# Patient Record
Sex: Female | Born: 1960
Health system: Southern US, Community
[De-identification: ages and names within clinical notes are randomized; demographics above are authoritative.]

## PROBLEM LIST (undated history)

## (undated) DIAGNOSIS — M1990 Unspecified osteoarthritis, unspecified site: Secondary | ICD-10-CM

## (undated) DIAGNOSIS — K760 Fatty (change of) liver, not elsewhere classified: Secondary | ICD-10-CM

## (undated) DIAGNOSIS — N189 Chronic kidney disease, unspecified: Secondary | ICD-10-CM

## (undated) DIAGNOSIS — G4730 Sleep apnea, unspecified: Secondary | ICD-10-CM

## (undated) DIAGNOSIS — R011 Cardiac murmur, unspecified: Secondary | ICD-10-CM

## (undated) DIAGNOSIS — E7800 Pure hypercholesterolemia, unspecified: Secondary | ICD-10-CM

## (undated) DIAGNOSIS — R935 Abnormal findings on diagnostic imaging of other abdominal regions, including retroperitoneum: Secondary | ICD-10-CM

## (undated) DIAGNOSIS — D649 Anemia, unspecified: Secondary | ICD-10-CM

## (undated) DIAGNOSIS — L732 Hidradenitis suppurativa: Secondary | ICD-10-CM

## (undated) DIAGNOSIS — K219 Gastro-esophageal reflux disease without esophagitis: Secondary | ICD-10-CM

## (undated) DIAGNOSIS — Z973 Presence of spectacles and contact lenses: Secondary | ICD-10-CM

## (undated) DIAGNOSIS — G8929 Other chronic pain: Secondary | ICD-10-CM

## (undated) DIAGNOSIS — D219 Benign neoplasm of connective and other soft tissue, unspecified: Secondary | ICD-10-CM

## (undated) DIAGNOSIS — T8859XA Other complications of anesthesia, initial encounter: Secondary | ICD-10-CM

## (undated) DIAGNOSIS — T7840XA Allergy, unspecified, initial encounter: Secondary | ICD-10-CM

## (undated) DIAGNOSIS — I10 Essential (primary) hypertension: Secondary | ICD-10-CM

## (undated) HISTORY — DX: Sleep apnea, unspecified: G47.30

## (undated) HISTORY — DX: Essential (primary) hypertension: I10

## (undated) HISTORY — PX: INGUINAL HIDRADENITIS EXCISION: SHX1827

## (undated) HISTORY — DX: Anemia, unspecified: D64.9

## (undated) HISTORY — DX: Abnormal findings on diagnostic imaging of other abdominal regions, including retroperitoneum: R93.5

## (undated) HISTORY — PX: ABDOMINAL HYSTERECTOMY: SHX81

## (undated) HISTORY — DX: Allergy, unspecified, initial encounter: T78.40XA

## (undated) HISTORY — PX: BREAST EXCISIONAL BIOPSY: SUR124

## (undated) HISTORY — PX: BREAST SURGERY: SHX581

## (undated) HISTORY — DX: Cardiac murmur, unspecified: R01.1

## (undated) HISTORY — DX: Hidradenitis suppurativa: L73.2

## (undated) HISTORY — DX: Benign neoplasm of connective and other soft tissue, unspecified: D21.9

---

## 2000-01-27 HISTORY — PX: SUPRACERVICAL ABDOMINAL HYSTERECTOMY: SHX5393

## 2004-09-26 ENCOUNTER — Encounter: Payer: Self-pay | Admitting: Family Medicine

## 2004-09-26 LAB — CONVERTED CEMR LAB: Pap Smear: NORMAL

## 2004-11-06 ENCOUNTER — Ambulatory Visit: Payer: Self-pay | Admitting: Family Medicine

## 2004-12-04 ENCOUNTER — Ambulatory Visit: Payer: Self-pay | Admitting: Family Medicine

## 2005-01-14 ENCOUNTER — Ambulatory Visit: Payer: Self-pay | Admitting: Family Medicine

## 2005-01-29 ENCOUNTER — Ambulatory Visit: Payer: Self-pay | Admitting: Family Medicine

## 2005-06-04 ENCOUNTER — Ambulatory Visit: Payer: Self-pay | Admitting: Family Medicine

## 2005-06-11 ENCOUNTER — Ambulatory Visit: Payer: Self-pay | Admitting: *Deleted

## 2005-11-03 DIAGNOSIS — E785 Hyperlipidemia, unspecified: Secondary | ICD-10-CM

## 2005-11-03 DIAGNOSIS — I10 Essential (primary) hypertension: Secondary | ICD-10-CM | POA: Insufficient documentation

## 2005-11-03 DIAGNOSIS — E1169 Type 2 diabetes mellitus with other specified complication: Secondary | ICD-10-CM | POA: Insufficient documentation

## 2005-12-03 ENCOUNTER — Ambulatory Visit: Payer: Self-pay | Admitting: Family Medicine

## 2005-12-03 ENCOUNTER — Encounter: Payer: Self-pay | Admitting: Family Medicine

## 2006-01-22 ENCOUNTER — Emergency Department (HOSPITAL_COMMUNITY): Admission: EM | Admit: 2006-01-22 | Discharge: 2006-01-22 | Payer: Self-pay | Admitting: Emergency Medicine

## 2006-03-24 ENCOUNTER — Encounter: Admission: RE | Admit: 2006-03-24 | Discharge: 2006-03-24 | Payer: Self-pay | Admitting: Family Medicine

## 2006-07-29 ENCOUNTER — Ambulatory Visit: Payer: Self-pay | Admitting: Family Medicine

## 2006-07-29 LAB — CONVERTED CEMR LAB
Cholesterol, target level: 200 mg/dL
HDL goal, serum: 40 mg/dL
LDL Goal: 160 mg/dL

## 2006-08-04 ENCOUNTER — Encounter: Payer: Self-pay | Admitting: Family Medicine

## 2006-08-04 DIAGNOSIS — N951 Menopausal and female climacteric states: Secondary | ICD-10-CM | POA: Insufficient documentation

## 2006-08-05 ENCOUNTER — Encounter: Payer: Self-pay | Admitting: Family Medicine

## 2006-08-05 LAB — CONVERTED CEMR LAB
ALT: 12 units/L (ref 0–35)
AST: 14 units/L (ref 0–37)
Albumin: 4.4 g/dL (ref 3.5–5.2)
Alkaline Phosphatase: 58 units/L (ref 39–117)
BUN: 13 mg/dL (ref 6–23)
CO2: 26 meq/L (ref 19–32)
Calcium: 9.7 mg/dL (ref 8.4–10.5)
Chloride: 101 meq/L (ref 96–112)
Cholesterol: 234 mg/dL — ABNORMAL HIGH (ref 0–200)
Creatinine, Ser: 0.76 mg/dL (ref 0.40–1.20)
FSH: 7.4 milliintl units/mL
Glucose, Bld: 107 mg/dL — ABNORMAL HIGH (ref 70–99)
HDL: 51 mg/dL (ref >39)
LDL Cholesterol: 147 mg/dL — ABNORMAL HIGH (ref 0–99)
LH: 4 milliintl units/mL
Potassium: 3.4 meq/L — ABNORMAL LOW (ref 3.5–5.3)
Sodium: 139 meq/L (ref 135–145)
Total Bilirubin: 0.5 mg/dL (ref 0.3–1.2)
Total CHOL/HDL Ratio: 4.6
Total Protein: 7.3 g/dL (ref 6.0–8.3)
Triglycerides: 178 mg/dL — ABNORMAL HIGH (ref <150)
VLDL: 36 mg/dL (ref 0–40)

## 2006-11-25 ENCOUNTER — Ambulatory Visit: Payer: Self-pay | Admitting: Family Medicine

## 2007-04-11 ENCOUNTER — Encounter: Admission: RE | Admit: 2007-04-11 | Discharge: 2007-04-11 | Payer: Self-pay | Admitting: Obstetrics and Gynecology

## 2007-06-30 ENCOUNTER — Ambulatory Visit: Payer: Self-pay | Admitting: Family Medicine

## 2007-07-18 ENCOUNTER — Encounter: Payer: Self-pay | Admitting: Family Medicine

## 2007-07-19 LAB — CONVERTED CEMR LAB
ALT: 13 units/L (ref 0–35)
AST: 15 units/L (ref 0–37)
Albumin: 3.9 g/dL (ref 3.5–5.2)
Alkaline Phosphatase: 50 units/L (ref 39–117)
BUN: 13 mg/dL (ref 6–23)
CO2: 21 meq/L (ref 19–32)
Calcium: 9 mg/dL (ref 8.4–10.5)
Chloride: 103 meq/L (ref 96–112)
Cholesterol: 200 mg/dL (ref 0–200)
Creatinine, Ser: 0.63 mg/dL (ref 0.40–1.20)
Glucose, Bld: 98 mg/dL (ref 70–99)
HDL: 43 mg/dL (ref >39)
LDL Cholesterol: 114 mg/dL — ABNORMAL HIGH (ref 0–99)
Potassium: 3.9 meq/L (ref 3.5–5.3)
Sodium: 139 meq/L (ref 135–145)
Total Bilirubin: 0.3 mg/dL (ref 0.3–1.2)
Total CHOL/HDL Ratio: 4.7
Total Protein: 6.3 g/dL (ref 6.0–8.3)
Triglycerides: 216 mg/dL — ABNORMAL HIGH (ref <150)
VLDL: 43 mg/dL — ABNORMAL HIGH (ref 0–40)

## 2007-08-22 ENCOUNTER — Ambulatory Visit: Payer: Self-pay | Admitting: Family Medicine

## 2007-08-22 LAB — CONVERTED CEMR LAB
Bilirubin Urine: NEGATIVE
Blood in Urine, dipstick: NEGATIVE
Glucose, Urine, Semiquant: NEGATIVE
Ketones, urine, test strip: NEGATIVE
Nitrite: NEGATIVE
Protein, U semiquant: NEGATIVE
Specific Gravity, Urine: <1.005
Urobilinogen, UA: 0.2
WBC Urine, dipstick: NEGATIVE
pH: 5.5

## 2007-08-23 ENCOUNTER — Encounter: Payer: Self-pay | Admitting: Family Medicine

## 2008-01-02 ENCOUNTER — Ambulatory Visit: Payer: Self-pay | Admitting: Family Medicine

## 2008-01-02 DIAGNOSIS — M170 Bilateral primary osteoarthritis of knee: Secondary | ICD-10-CM | POA: Insufficient documentation

## 2008-01-02 DIAGNOSIS — M1712 Unilateral primary osteoarthritis, left knee: Secondary | ICD-10-CM | POA: Insufficient documentation

## 2008-04-02 ENCOUNTER — Ambulatory Visit: Payer: Self-pay | Admitting: Family Medicine

## 2008-04-02 DIAGNOSIS — M2550 Pain in unspecified joint: Secondary | ICD-10-CM | POA: Insufficient documentation

## 2008-04-02 DIAGNOSIS — G2581 Restless legs syndrome: Secondary | ICD-10-CM | POA: Insufficient documentation

## 2008-04-11 ENCOUNTER — Encounter: Admission: RE | Admit: 2008-04-11 | Discharge: 2008-04-11 | Payer: Self-pay | Admitting: Obstetrics and Gynecology

## 2008-04-27 ENCOUNTER — Ambulatory Visit: Payer: Self-pay | Admitting: Family Medicine

## 2008-04-27 DIAGNOSIS — I10 Essential (primary) hypertension: Secondary | ICD-10-CM | POA: Insufficient documentation

## 2008-04-27 LAB — CONVERTED CEMR LAB: Rapid Strep: NEGATIVE

## 2008-08-20 ENCOUNTER — Encounter: Payer: Self-pay | Admitting: Family Medicine

## 2008-08-21 LAB — CONVERTED CEMR LAB
Anti Nuclear Antibody(ANA): NEGATIVE
BUN: 12 mg/dL (ref 6–23)
CO2: 23 meq/L (ref 19–32)
Calcium: 8.9 mg/dL (ref 8.4–10.5)
Chloride: 103 meq/L (ref 96–112)
Cholesterol: 204 mg/dL — ABNORMAL HIGH (ref 0–200)
Creatinine, Ser: 0.62 mg/dL (ref 0.40–1.20)
Glucose, Bld: 95 mg/dL (ref 70–99)
HCT: 36.8 % (ref 36.0–46.0)
HDL: 41 mg/dL (ref >39)
Hemoglobin: 12.4 g/dL (ref 12.0–15.0)
LDL Cholesterol: 127 mg/dL — ABNORMAL HIGH (ref 0–99)
MCHC: 33.7 g/dL (ref 30.0–36.0)
MCV: 87.8 fL (ref 78.0–100.0)
Platelets: 266 10*3/uL (ref 150–400)
Potassium: 3.9 meq/L (ref 3.5–5.3)
RBC: 4.19 M/uL (ref 3.87–5.11)
RDW: 13.1 % (ref 11.5–15.5)
Sed Rate: 19 mm/hr (ref 0–22)
Sodium: 140 meq/L (ref 135–145)
TSH: 0.801 microintl units/mL (ref 0.350–4.500)
Total CHOL/HDL Ratio: 5
Triglycerides: 178 mg/dL — ABNORMAL HIGH (ref <150)
VLDL: 36 mg/dL (ref 0–40)
WBC: 9.5 10*3/uL (ref 4.0–10.5)

## 2008-09-07 ENCOUNTER — Ambulatory Visit: Payer: Self-pay | Admitting: Family Medicine

## 2008-09-07 DIAGNOSIS — F410 Panic disorder [episodic paroxysmal anxiety] without agoraphobia: Secondary | ICD-10-CM | POA: Insufficient documentation

## 2008-09-19 ENCOUNTER — Ambulatory Visit: Payer: Self-pay | Admitting: Family Medicine

## 2008-09-19 ENCOUNTER — Encounter: Payer: Self-pay | Admitting: Family Medicine

## 2009-03-05 ENCOUNTER — Ambulatory Visit: Payer: Self-pay | Admitting: Family Medicine

## 2009-03-13 ENCOUNTER — Encounter: Admission: RE | Admit: 2009-03-13 | Discharge: 2009-03-13 | Payer: Self-pay | Admitting: Family Medicine

## 2009-04-12 ENCOUNTER — Encounter: Admission: RE | Admit: 2009-04-12 | Discharge: 2009-04-12 | Payer: Self-pay | Admitting: Obstetrics and Gynecology

## 2009-06-05 ENCOUNTER — Encounter: Admission: RE | Admit: 2009-06-05 | Discharge: 2009-06-05 | Payer: Self-pay | Admitting: Orthopedic Surgery

## 2009-06-28 ENCOUNTER — Encounter: Payer: Self-pay | Admitting: Family Medicine

## 2009-07-02 ENCOUNTER — Ambulatory Visit: Payer: Self-pay | Admitting: Family

## 2009-07-02 DIAGNOSIS — E1169 Type 2 diabetes mellitus with other specified complication: Secondary | ICD-10-CM | POA: Insufficient documentation

## 2009-07-02 DIAGNOSIS — R809 Proteinuria, unspecified: Secondary | ICD-10-CM | POA: Insufficient documentation

## 2009-07-10 ENCOUNTER — Encounter: Payer: Self-pay | Admitting: Family

## 2009-07-10 LAB — CONVERTED CEMR LAB
ALT: 17 units/L (ref 0–35)
AST: 17 units/L (ref 0–37)
Albumin: 4.4 g/dL (ref 3.5–5.2)
Alkaline Phosphatase: 52 units/L (ref 39–117)
BUN: 14 mg/dL (ref 6–23)
CO2: 25 meq/L (ref 19–32)
Calcium: 8.9 mg/dL (ref 8.4–10.5)
Chloride: 100 meq/L (ref 96–112)
Cholesterol: 231 mg/dL — ABNORMAL HIGH (ref 0–200)
Creatinine, Ser: 0.62 mg/dL (ref 0.40–1.20)
Creatinine, Urine: 125.6 mg/dL
Glucose, Bld: 103 mg/dL — ABNORMAL HIGH (ref 70–99)
HDL: 45 mg/dL (ref >39)
Hgb A1c MFr Bld: 6.4 % — ABNORMAL HIGH (ref <5.7)
LDL Cholesterol: 138 mg/dL — ABNORMAL HIGH (ref 0–99)
Microalb Creat Ratio: 4.5 mg/g (ref 0.0–30.0)
Microalb, Ur: 0.56 mg/dL (ref 0.00–1.89)
Potassium: 3.7 meq/L (ref 3.5–5.3)
Sodium: 137 meq/L (ref 135–145)
Total Bilirubin: 0.3 mg/dL (ref 0.3–1.2)
Total CHOL/HDL Ratio: 5.1
Total Protein: 6.9 g/dL (ref 6.0–8.3)
Triglycerides: 242 mg/dL — ABNORMAL HIGH (ref <150)
VLDL: 48 mg/dL — ABNORMAL HIGH (ref 0–40)

## 2009-07-15 ENCOUNTER — Telehealth: Payer: Self-pay | Admitting: Family

## 2009-08-12 ENCOUNTER — Encounter: Admission: RE | Admit: 2009-08-12 | Discharge: 2009-08-12 | Payer: Self-pay | Admitting: Family Medicine

## 2009-08-12 ENCOUNTER — Encounter: Payer: Self-pay | Admitting: Family Medicine

## 2009-10-24 ENCOUNTER — Encounter: Payer: Self-pay | Admitting: Emergency Medicine

## 2009-11-05 ENCOUNTER — Encounter
Admission: RE | Admit: 2009-11-05 | Discharge: 2009-11-05 | Payer: Self-pay | Source: Home / Self Care | Attending: Family Medicine | Admitting: Family Medicine

## 2009-11-12 ENCOUNTER — Encounter
Admission: RE | Admit: 2009-11-12 | Discharge: 2009-11-12 | Payer: Self-pay | Source: Home / Self Care | Attending: Family Medicine | Admitting: Family Medicine

## 2009-11-28 ENCOUNTER — Encounter: Payer: Self-pay | Admitting: Family Medicine

## 2009-11-28 LAB — CONVERTED CEMR LAB
Cholesterol: 169 mg/dL
HDL: 50 mg/dL
Hgb A1c MFr Bld: 6.5 %
LDL Cholesterol: 79 mg/dL
Triglyceride fasting, serum: 200 mg/dL

## 2009-12-16 ENCOUNTER — Ambulatory Visit: Payer: Self-pay | Admitting: Family Medicine

## 2009-12-25 ENCOUNTER — Encounter: Payer: Self-pay | Admitting: Family Medicine

## 2009-12-25 ENCOUNTER — Telehealth: Payer: Self-pay | Admitting: Family Medicine

## 2010-02-25 NOTE — Assessment & Plan Note (Signed)
Summary: COLD OR SINUS/KM   Vital Signs:  Patient Profile:   50 Years Old Female CC:      headache, sorethroat, sinus pressure for three days Height:     60.5 inches Weight:      174 pounds O2 Sat:      96 % O2 treatment:    Room Air Temp:     97.3 degrees F oral Pulse rate:   93 / minute Pulse rhythm:   regular Resp:     17 per minute BP sitting:   147 / 91  (left arm)  Pt. in pain?   no  Vitals Entered By: Lannie Fields, RN                   Current Allergies: ! CECLOR ! SULFA ! TETRACYCLINE ! PCN History of Present Illness Chief Complaint: headache, sorethroat, sinus pressure for three days History of Present Illness: Subjective: Patient complains of URI symptoms for one week, starting with a sore throat that has persisted + cough that started last night, keeping her awake No pleuritic pain No wheezing + nasal congestion No post-nasal drainage ? sinus pain/pressure No itchy/red eyes No earache No hemoptysis No SOB No fever/chills No nausea No vomiting No abdominal pain No diarrhea No skin rashes + fatigue + myalgias No headache Used OTC meds without relief    REVIEW OF SYSTEMS Constitutional Symptoms      Denies fever, chills, night sweats, weight loss, weight gain, and fatigue.  Eyes       Denies change in vision, eye pain, eye discharge, glasses, contact lenses, and eye surgery. Ear/Nose/Throat/Mouth       Complains of sinus problems and sore throat.      Denies hearing loss/aids, change in hearing, ear pain, ear discharge, dizziness, frequent runny nose, frequent nose bleeds, hoarseness, and tooth pain or bleeding.  Respiratory       Complains of dry cough.      Denies productive cough, wheezing, shortness of breath, asthma, bronchitis, and emphysema/COPD.  Cardiovascular       Denies murmurs, chest pain, and tires easily with exhertion.    Gastrointestinal       Denies stomach pain, nausea/vomiting, diarrhea, constipation, blood in bowel  movements, and indigestion. Genitourniary       Denies painful urination, kidney stones, and loss of urinary control. Neurological       Complains of headaches.      Denies paralysis, seizures, and fainting/blackouts. Musculoskeletal       Denies muscle pain, joint pain, joint stiffness, decreased range of motion, redness, swelling, muscle weakness, and gout.  Skin       Denies bruising, unusual mles/lumps or sores, and hair/skin or nail changes.  Psych       Denies mood changes, temper/anger issues, anxiety/stress, speech problems, depression, and sleep problems.  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  Divorced.  2 daughters.  Never smoked.  No EtOH, no drugs, 2 caffeinated drinks per day.  Walks daily.  Objective:  No acute distress  Eyes:  Pupils are equal, round, and reactive to light and accomdation.  Extraocular movement is intact.  Conjunctivae are not inflamed.  Ears:  Canals normal.  Tympanic membranes normal.   Nose:  Normal septum.  Normal turbinates, mildly congested.    No sinus tenderness present.  Pharynx:  Mildly erythematous Neck:  Supple.  Slightly tender shotty anterior/posterior nodes are palpated bilaterally.  Lungs:  Clear to auscultation.  Breath sounds are equal.  Heart:  Regular rate and rhythm without murmurs, rubs, or gallops.  Abdomen:  Nontender without masses or hepatosplenomegaly.  Bowel sounds are present.  No CVA or flank tenderness.  Rapid strep:  Negative    Assessment New Problems: URI (ICD-465.9) FAMILY HISTORY DIABETES 1ST DEGREE RELATIVE (ICD-V18.0) FAMILY HISTORY OF  ASTHMA (ICD-V17.5) HYPERTENSION (ICD-401.9)  Suspect viral URI  Plan New Medications/Changes: ZITHROMAX Z-PAK 250 MG  TABS (AZITHROMYCIN) Use as directed  #1 x 0, 04/27/2008, Donna Christen MD Sandria Senter ER 8-10 MG/5ML LQCR (CHLORPHENIRAMINE-HYDROCODONE) 5cc by mouth hs as needed cough  #2oz x 0, 04/27/2008, Donna Christen MD  New Orders: New Patient Level III [phone] Planning Comments:   Treat symptomatically for now:  expectorant, topical decongestant.  Cough suprressant at night. Add Zithromax if not improving in 5 days. Follow-up with family doctor.   The patient and/or caregiver has been counseled thoroughly with regard to medications prescribed including dosage, schedule, interactions, rationale for use, and possible side effects and they verbalize understanding.  Diagnoses and expected course of recovery discussed and will return if not improved as expected or if the condition worsens. Patient and/or caregiver verbalized understanding.    Prescriptions: ZITHROMAX Z-PAK 250 MG  TABS (AZITHROMYCIN) Use as directed  #1 x 0   Entered and Authorized by:   Donna Christen MD   Signed by:   Donna Christen MD on 04/27/2008   Method used:   Print then Give to Patient   RxID:   [id number] Sandria Senter ER 8-10 MG/5ML LQCR (CHLORPHENIRAMINE-HYDROCODONE) 5cc by mouth hs as needed cough  #2oz x 0   Entered and Authorized by:   Donna Christen MD   Signed by:   Donna Christen MD on 04/27/2008   Method used:   Print then Give to Patient   RxID:   [id number]    Patient Instructions: 1)  May use Mucinex  (guaifenesin) twice daily for congestion. 2)  Increase fluid intake, rest. 3)  May use Afrin nasal spray (or generic oxymetazoline) twice daily for about 5 days.  Also recommend using saline nasal spray several times daily and/or saline nasal irrigation. 4)  Add Zithromax if not improving 5 to 7 days. 5)  Followup with family doctor if not improving  ] ]  Laboratory Results  Date/Time Received: Areta Haber CMA  April 27, 2008 12:01 PM  Date/Time Reported: Areta Haber CMA  April 27, 2008 12:01 PM   Other Tests  Rapid Strep: negative  Stool - Occult Blood Date: .  Kit Test Internal QC: Negative   (Normal Range: Negative)

## 2010-02-25 NOTE — Assessment & Plan Note (Signed)
Summary: HTN, glucose   Vital Signs:  Patient profile:   50 year old female Height:      60.5 inches Weight:      166 pounds Pulse rate:   73 / minute BP sitting:   111 / 61  (right arm) Cuff size:   large  Vitals Entered By: Avon Gully CMA, Duncan Dull) (December 16, 2009 4:07 PM) CC: discuss lab results, needs refills   Primary Care Provider:  Linford Arnold, C  CC:  discuss lab results and needs refills.  History of Present Illness: discuss lab results, needs refills.  Brought in a copy of her labs from work.   Sugars have been runnin 112-129 pre meal nad 150-160s post meal.  She is getting nutrition counseling and has been working out on the treadmill 3 x a week.  She has been trying to lose weight. She is down 4 lbs.   Current Medications (verified): 1)  Lisinopril-Hydrochlorothiazide 20-25 Mg Tabs (Lisinopril-Hydrochlorothiazide) .... Take 1 Tablet By Mouth Once A Day 2)  Tru-Trak Test Strips .... Test Twice Daily 3)  Lancets  Misc (Lancets) 4)  Aspirin 81 Mg Tabs (Aspirin) .... One Tablet By Mouth Daily 5)  Pravachol 20 Mg Tabs (Pravastatin Sodium) .... One Tablet By Mouth Daily  Allergies (verified): 1)  ! Ceclor 2)  ! Sulfa 3)  ! Tetracycline 4)  ! Pcn  Comments:  Nurse/Medical Assistant: The patient's medications and allergies were reviewed with the patient and were updated in the Medication and Allergy Lists. 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  2 daughters.  Never smoked.  No EtOH, no drugs, 2 caffeinated drinks per day.  Walks daily on the treadmill.   Physical Exam  General:  Well-developed,well-nourished,in no acute distress; alert,appropriate and cooperative throughout examination Lungs:  Normal respiratory effort, chest expands symmetrically. Lungs are clear to auscultation, no crackles or wheezes. Heart:  Normal rate and regular rhythm. S1  and S2 normal without gallop, murmur, click, rub or other extra sounds. Skin:  no rashes.   Psych:  Cognition and judgment appear intact. Alert and cooperative with normal attention span and concentration. No apparent delusions, illusions, hallucinations   Impression & Recommendations:  Problem # 1:  HYPERTENSION (ICD-401.9) We will cut your blood pressure pill in half.  Recheck in one 1-2 months withthe nurse.  Her updated medication list for this problem includes:    Lisinopril-hydrochlorothiazide 20-25 Mg Tabs (Lisinopril-hydrochlorothiazide) .[name]... Take 1 tablet by mouth once a day  Problem # 2:  INSULIN RESISTANCE SYNDROME (ICD-259.8) Doing very well. Brough in her glucose logs and she is doing great on her diabetic diet. Needs strips. Keep up the good work. Recheck in 6 months. A1C is 6.5.   Complete Medication List: 1)  Lisinopril-hydrochlorothiazide 20-25 Mg Tabs (Lisinopril-hydrochlorothiazide) .... Take 1 tablet by mouth once a day 2)  Tru-trak Test Strips  .... Test twice daily 3)  Lancets Misc (Lancets) 4)  Aspirin 81 Mg Tabs (Aspirin) .... One tablet by mouth daily 5)  Fish Oil 1000 Mg Caps (Omega-3 fatty acids) .... 4 tabs by mouth daily.  Patient Instructions: 1)  Cut BP pill in half and recheck with the nurse in 1 month.  2)  Follow up sugars in 6 months.  Prescriptions: TRU-TRAK TEST STRIPS test twice daily  #100 x 2   Entered and Authorized by:   Nani Gasser MD   Signed  by:   Nani Gasser MD on 12/16/2009   Method used:   Printed then faxed to ...       [facility]  [address] [phone]* (retail)       [address] [place], [place]  [address]       Ph: [phone] or [phone]       Fax: [phone]   RxID:   [id number]    Orders Added: 1)  Est. Patient Level III [36644]     Lipid Panel Test Date: 11/28/2009                        Value        Units        H/L   Reference  Cholesterol:          169          mg/dL               (034-742) LDL Cholesterol:      79           mg/dL              (59-563) HDL Cholesterol:      50           mg/dL              (87-56) Triglyceride:         200          mg/dL              (43-329)

## 2010-02-25 NOTE — Assessment & Plan Note (Signed)
Summary: FU HTN, cholesterol, arthralgia, etc   Vital Signs:  Patient Profile:   50 Years Old Female Height:     60.5 inches Weight:      175 pounds Pulse rate:   84 / minute BP sitting:   122 / 65  (left arm) Cuff size:   large  Vitals Entered By: Kathlene November (April 02, 2008 3:31 PM)                 PCP:  Cipriano Bunker  Chief Complaint:  renew BP med and get bloodwork.  History of Present Illness: pain in the left knee and in her shoulder for months.  Seen in the sports clinic for her knee and told she OA.  Occ pain in her hands in the MTPs and the PIPs and notices occ swelling with the pain in those joints. Worst pain in the 3rd finger on the left hand. Left knee has swollen as well.  No family hx of rhuematoid d/o. No hx of psoriasis.   Also having pain from her knees to her ankles. Occurs mostly at night time when she is resting and sitting.[name]  Has a few varicose veins but not that many.  No swelling around the ankles. No paleness of the feet. Says she has good blood flow in her feet.  Says does feel better whern she gets up and moves. Has a primarly sit down job.  No back pain. No muscle cramping.   Hypertension History:      She denies headache, chest pain, palpitations, dyspnea with exertion, orthopnea, PND, peripheral edema, visual symptoms, neurologic problems, syncope, and side effects from treatment.  She notes no problems with any antihypertensive medication side effects.        Positive major cardiovascular risk factors include hyperlipidemia and hypertension.  Negative major cardiovascular risk factors include female age less than 70 years old, no history of diabetes, negative family history for ischemic heart disease, and non-tobacco-user status.        Further assessment for target organ damage reveals no history of ASHD, cardiac end-organ damage (CHF/LVH), stroke/TIA, peripheral vascular disease, renal insufficiency, or hypertensive retinopathy.    Lipid Management  History:      Positive NCEP/ATP III risk factors include hypertension.  Negative NCEP/ATP III risk factors include female age less than 85 years old, no history of early menopause without estrogen hormone replacement, non-diabetic, no family history for ischemic heart disease, non-tobacco-user status, no ASHD (atherosclerotic heart disease), no prior stroke/TIA, no peripheral vascular disease, and no history of aortic aneurysm.        Current Allergies: ! CECLOR ! SULFA ! TETRACYCLINE ! PCN      Physical Exam  General:     Well-developed,well-nourished,in no acute distress; alert,appropriate and cooperative throughout examination Head:     Normocephalic and atraumatic without obvious abnormalities. No apparent alopecia or balding. Lungs:     Normal respiratory effort, chest expands symmetrically. Lungs are clear to auscultation, no crackles or wheezes. Heart:     Normal rate and regular rhythm. S1 and S2 normal without gallop, murmur, click, rub or other extra sounds. No carotid bruits.  Msk:     No swelling in her legs today.  No swelling in her finger joints. Pulses:     Raidla 2+  Extremities:     NO LE edema  Skin:     no rashes.   Psych:     Cognition and judgment appear intact. Alert and cooperative with  normal attention span and concentration. No apparent delusions, illusions, hallucinations    Impression & Recommendations:  Problem # 1:  HYPERTENSION, BENIGN SYSTEMIC (ICD-401.1) Assessment: Improved  Her updated medication list for this problem includes:    Lisinopril-hydrochlorothiazide 20-25 Mg Tabs (Lisinopril-hydrochlorothiazide) .[name]... Take 1 tablet by mouth once a day  BP today: 122/65 Prior BP: 138/78 (01/02/2008)  Prior 10 Yr Risk Heart Disease: 6 % (07/19/2007)  Labs Reviewed: Creat: 0.63 (07/18/2007) Chol: 200 (07/18/2007)   HDL: 43 (07/18/2007)   LDL: 114 (07/18/2007)   TG: 216 (07/18/2007)  Orders: T-Basic Metabolic Panel [id number])  T-Lipid Profile [phone]) T-CBC No Diff ([id number]) T-TSH (95638-75643)   Problem # 2:  POLYARTHRITIS (ICD-719.49) Assessment: New Discussed likely OA based on hx but will check a sed rate and ANA Orders: T-ANA ([id number]) T-Sed Rate (Automated) ([id number])   Problem # 3:  RESTLESS LEG SYNDROME (ICD-333.94) Assessment: New Discussed that based on he symptmos sounds consistant with RLS. Can rule out anemia and thyroid d/o. Pt not interested in a medication trial for tx.   Problem # 4:  HYPERCHOLESTEROLEMIA (ICD-272.0) Due to recheck her TG to make sure at goal.   Orders: T-Lipid Profile ([id number])   Complete Medication List: 1)  Lisinopril-hydrochlorothiazide 20-25 Mg Tabs (Lisinopril-hydrochlorothiazide) .... Take 1 tablet by mouth once a day  Hypertension Assessment/Plan:      The patient's hypertensive risk group is category B: At least one risk factor (excluding diabetes) with no target organ damage.  Her calculated 10 year risk of coronary heart disease is 6 %.  Today's blood pressure is 122/65.  Her blood pressure goal is < 140/90.  Lipid Assessment/Plan:      Based on NCEP/ATP III, the patient's risk factor category is "0-1 risk factors".  From this information, the patient's calculated lipid goals are as follows: Total cholesterol goal is 200; LDL cholesterol goal is 160; HDL cholesterol goal is 40; Triglyceride goal is 150.  Her LDL cholesterol goal has been met.  She has been counseled on adjunctive measures for lowering her cholesterol and has been provided with dietary instructions.      Prescriptions: LISINOPRIL-HYDROCHLOROTHIAZIDE 20-25 MG TABS (LISINOPRIL-HYDROCHLOROTHIAZIDE) Take 1 tablet by mouth once a day  #90 x 3   Entered and Authorized by:   Nani Gasser MD   Signed by:   Nani Gasser MD on 04/02/2008   Method used:   [facility]        [facility] [phone]* (retail)       [address]       [place], [place]   [address]       Ph: [phone] or [phone]       Fax: [phone]   RxID:   [id number]

## 2010-02-25 NOTE — Assessment & Plan Note (Signed)
Summary: NP KNEE PAIN/JW   Vital Signs:  Patient Profile:   50 Years Old Female Height:     60.5 inches Pulse rate:   82 / minute BP sitting:   138 / 78  Vitals Entered By: Lillia Pauls CMA (January 02, 2008 1:46 PM)                 PCP:  Cipriano Bunker  Chief Complaint:  np with l knee pain for a while and worsening recently.  History of Present Illness: left knee pain off and on for about 2 years. had stepped into a whole in yard at some point and taht made it worse, had no biig swelling at that time. Has intermittentrly gotten better  until last 1-2 weeks, feeling "tight" and hurting some days more than others.  No locking ir giving way, has never sen redness and no frank swelling. no calf pain.  PMH no prior knee surgeries. Other than above, no specific knee injury.    Current Allergies: ! CECLOR ! SULFA ! TETRACYCLINE      Physical Exam  General:     alert, well-developed, and well-nourished.     Knee Exam  Knee Exam:    Left:    Inspection:  Normal    Palpation:  Normal    Stability:  stable    Tenderness:  no    Swelling:  no    Erythema:  no    FROM on flexion and ectension, ligamentously intact. McMurray negative    Impression & Recommendations:  Problem # 1:  KNEE PAIN, LEFT (ICD-719.46) I suspect she has some mild OA or knee joint--probably in patellofemoral compartment as well--and is having intermittent inflammatiion of this related to activities such as standing. I woudl recommend tylenol or ibuprofen for pain, unless symptomsmptoms worsen or become more chronic I would not do further workup. Nothing indicates meniscal pathology, hx inconsisten for OCD lesion or fracture. Reassured --if signs worsen would get weaight bearing AP in 30 degrees flexion as initial imaging. She is in agreement w this plan.  Complete Medication List: 1)  Lisinopril-hydrochlorothiazide 20-25 Mg Tabs (Lisinopril-hydrochlorothiazide) .... Take 1 tablet by mouth once  a day 2)  Metrogel 1 % Gel (Metronidazole) .... Apply daily to affected area 3)  Cipro 500 Mg Tabs (Ciprofloxacin hcl) .... Take 1 tablet by mouth two times a day    ]

## 2010-02-25 NOTE — Letter (Signed)
Summary: Health Screening Report  Health Screening Report   Imported By: Maryln Gottron 12/30/2009 15:34:29  _____________________________________________________________________  External Attachment:    Type:   Image     Comment:   External Document

## 2010-02-25 NOTE — Progress Notes (Signed)
  Phone Note Outgoing Call   Summary of Call: Please contact patient and let her know that her cholesterol is elevate.  LDL (bad cholesterol is 138) Hers should be under 100.  A1C was 6.4 per our lab.  I would recommend that she start a statin (Pravachol sent to her pharmacy) for her cholesterol and that she f/u in 1 month for lab draw (LFT's, FLP) [id number].  She should call us if she develops muscle pain while on this medication. Initial call taken by: Lemont Fillers FNP,  July 15, 2009 12:10 PM  Follow-up for Phone Call        LM with son to call office back Follow-up by: Kathlene November,  July 15, 2009 12:12 PM  Additional Follow-up for Phone Call Additional follow up Details #1::        Pt notified of results and instructions Additional Follow-up by: Kathlene November,  July 15, 2009 1:32 PM    New/Updated Medications: PRAVACHOL 20 MG TABS (PRAVASTATIN SODIUM) one tablet by mouth daily Prescriptions: PRAVACHOL 20 MG TABS (PRAVASTATIN SODIUM) one tablet by mouth daily  #30 x 1   Entered and Authorized by:   Lemont Fillers FNP   Signed by:   Lemont Fillers FNP on 07/15/2009   Method used:   [facility]        [facility] [phone][facility] (retail)       [address]       [place], [place]  [address]       Ph: [phone] or [phone]       Fax: [phone]   RxID:   [id number]

## 2010-02-25 NOTE — Assessment & Plan Note (Signed)
Summary: RIGHT ARM PAIN, panic attacks.    Vital Signs:  Patient profile:   50 year old female Height:      60.5 inches Weight:      172 pounds BMI:     33.16 O2 Sat:      99 % on Room air Temp:     98.4 degrees F oral Pulse rate:   84 / minute BP sitting:   138 / 76  (left arm)  Vitals Entered By: Payton Spark CMA/april (March 05, 2009 2:57 PM)  O2 Flow:  Room air CC: pain in right arm    Primary Care Provider:  Linford Arnold, C  CC:  pain in right arm .  History of Present Illness: Saw Dr. Ophelia Charter in October for carpel tunnel of the right wrist. Had a cortisone injection.   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 Pain is over the uper arm. Can't sleep on it.  Iced it, Tyelnol and Advil don'e work.  Has a burning sensation.  WAs seeing her chirpractoer who did xrays of her neck adn noticed some spur on L5.  Then saw Dr. Jorge Mandril about 2 weeks ago who feels the nerve is pinched somewhere. Had a nerve conduction study and now is being schecule for an [place] to determine when the nerve is being pinched. Wanted my opinion on it. Rarely gets numnbess at night anymore so no longer wearing her night splints.   notes has been having panic attacks almost daily for 2 weeks. Up until then was doing well. She usually will hyperventilate with her panic attacks.  Did use one fo the clonazapam and it did help but made her very sleepy. Denies any new stressors.  Says can't be sedated at work.   Current Medications (verified): 1)  Lisinopril-Hydrochlorothiazide 20-25 Mg Tabs (Lisinopril-Hydrochlorothiazide) .... Take 1 Tablet By Mouth Once A Day 2)  Clonazepam 0.5 Mg Tabs (Clonazepam) .... Take 1 Tablet By Mouth Once A Day As Needed For Anxiety  Allergies (verified): 1)  ! Ceclor 2)  ! Sulfa 3)  ! Tetracycline 4)  ! Pcn  Social History: Reviewed history from 04/27/2008 and no changes required. Best boy at [employer]. 2 yrs college education.  Divorced.  2 daughters.  Never smoked.  No  EtOH, no drugs, 2 caffeinated drinks per day.  Walks daily.  Physical Exam  General:  Well-developed,well-nourished,in no acute distress; alert,appropriate and cooperative throughout examination Lungs:  Normal respiratory effort, chest expands symmetrically. Lungs are clear to auscultation, no crackles or wheezes. Heart:  Normal rate and regular rhythm. S1 and S2 normal without gallop, murmur, click, rub or other extra sounds. Msk:  Right shoulder with NROM. STrength 5/5 in shoulder, elbow, and wrist. pain with external rotation and internal rotation. Nontender over the shoulder or upper arm.     Impression & Recommendations:  Problem # 1:  ARM PAIN (ICD-729.5) Assessment New Discussed that this could be from her wrist, her elbow or her neck. Recommend she keep [place] appt. Will try tramadol since not sedating for painful. Call if not helping. Keep appt wtih ortho  Problem # 2:  PANIC ATTACK (ICD-300.01) Assessment: Deteriorated Discussed options. I really think she would benefit from an SSRi since the benzos are very sedating for her and can't take them at work.  She is OK with this. Warned of potential SE.    F/u in one month to see if helping. If comes off the medication then needs to be tapered.  Don't stop without calling.  Can still use her clonazepam as needed.  Her updated medication list for this problem includes:    Clonazepam 0.5 Mg Tabs (Clonazepam) .[name]... Take 1 tablet by mouth once a day as needed for anxiety    Citalopram Hydrobromide 20 Mg Tabs (Citalopram hydrobromide) .[name]... 1/2 by mouth every for one week then increase to 1 tab by mouth daily  Complete Medication List: 1)  Lisinopril-hydrochlorothiazide 20-25 Mg Tabs (Lisinopril-hydrochlorothiazide) .... Take 1 tablet by mouth once a day 2)  Clonazepam 0.5 Mg Tabs (Clonazepam) .... Take 1 tablet by mouth once a day as needed for anxiety 3)  Citalopram Hydrobromide 20 Mg Tabs (Citalopram hydrobromide) .... 1/2 by mouth  every for one week then increase to 1 tab by mouth daily 4)  Tramadol Hcl 50 Mg Tabs (Tramadol hcl) .... One by mouth up to three times a day as needed severe pain Prescriptions: TRAMADOL HCL 50 MG TABS (TRAMADOL HCL) one by mouth up to three times a day as needed severe pain  #40 x 0   Entered and Authorized by:   Nani Gasser MD   Signed by:   Nani Gasser MD on 03/05/2009   Method used:   [facility]        [facility]  [address] [phone]* (retail)       [address]       [place], [place]  [address]       Ph: [phone] or [phone]       Fax: [phone]   RxID:   [id number] LISINOPRIL-HYDROCHLOROTHIAZIDE 20-25 MG TABS (LISINOPRIL-HYDROCHLOROTHIAZIDE) Take 1 tablet by mouth once a day  #90 x 3   Entered and Authorized by:   Nani Gasser MD   Signed by:   Nani Gasser MD on 03/05/2009   Method used:   [facility]        [facility]  [address] [phone]* (retail)       [address]       [place], [place]  [address]       Ph: [phone] or [phone]       Fax: [phone]   RxID:   [id number] CITALOPRAM HYDROBROMIDE 20 MG TABS (CITALOPRAM HYDROBROMIDE) 1/2 by mouth every for one week then increase to 1 tab by mouth daily  #30 x 0   Entered and Authorized by:   Nani Gasser MD   Signed by:   Nani Gasser MD on 03/05/2009   Method used:   [facility] [phone]* (retail)       [address]       [place], [place]  [address]       Ph: [phone] or [phone]       Fax: [phone]   RxID:   [id number]

## 2010-02-25 NOTE — Progress Notes (Signed)
Summary: Lisinopril  Phone Note Call from Patient Call back at Home Phone [phone]   Caller: Patient Call For: Nani Gasser MD Summary of Call: Pt wants the lower dose of Lisinopril sent to [facility]. Has been taking half of her last dose but would like lower dose sent in so she doesn't have to cut them anymore Initial call taken by: Kathlene November LPN,  December 25, 2009 2:45 PM  Follow-up for Phone Call        Rx Called In Follow-up by: Nani Gasser MD,  December 26, 2009 7:53 AM    New/Updated Medications: LISINOPRIL-HYDROCHLOROTHIAZIDE 10-12.5 MG TABS (LISINOPRIL-HYDROCHLOROTHIAZIDE) Take 1 tablet by mouth once a day Prescriptions: LISINOPRIL-HYDROCHLOROTHIAZIDE 10-12.5 MG TABS (LISINOPRIL-HYDROCHLOROTHIAZIDE) Take 1 tablet by mouth once a day  #90 x 1   Entered and Authorized by:   Nani Gasser MD   Signed by:   Nani Gasser MD on 12/26/2009   Method used:   Electronically to        [facility]* (retail)       [address]       [address]/mailing       [place], [place]  [address]       Ph: [phone]       Fax: [phone]   RxID:   [id number]

## 2010-02-25 NOTE — Assessment & Plan Note (Signed)
Summary: DISCUSS HEALTH//VGJ  Nurse Visit   Vital Signs:  Patient profile:   50 year old female Height:      60.5 inches Weight:      170 pounds Pulse rate:   95 / minute BP sitting:   113 / 61  (left arm) Cuff size:   large  Vitals Entered By: Kathlene November (July 02, 2009 4:02 PM) CC: joined well link with [facility] and tested A1C it was 6.2 and they gave her a tru trak glucometer. Pt is needing the test strips and lancets for this machine   Current Medications (verified): 1)  Lisinopril-Hydrochlorothiazide 20-25 Mg Tabs (Lisinopril-Hydrochlorothiazide) .... Take 1 Tablet By Mouth Once A Day  Allergies (verified): 1)  ! Ceclor 2)  ! Sulfa 3)  ! Tetracycline 4)  ! Pcn  Comments:  Nurse/Medical Assistant: The patient's medications and allergies were reviewed with the patient and were updated in the Medication and Allergy Lists. Kathlene November (July 02, 2009 4:04 PM)  Appended Document: DISCUSS HEALTH//VGJ     Primary Care Provider:  Cipriano Bunker   History of Present Illness: Ms Eiben is a 50 year old female who presents today in follow up from [facility] program which noted an A1C of 6.2.  Notes that she had gestational diabetes, and both of her parents had diabetes.  Her mom is deceased.  Dad is still living.  Denies polyuria, Denies blurry vision. Denies polydypsia.    Allergies: 1)  ! Ceclor 2)  ! Sulfa 3)  ! Tetracycline 4)  ! Pcn  Physical Exam  General:  Well-developed,well-nourished,in no acute distress; alert,appropriate and cooperative throughout examination Extremities:  No clubbing, cyanosis, edema, or deformity noted  Psych:  Cognition and judgment appear intact. Alert and cooperative with normal attention span and concentration. No apparent delusions, illusions, hallucinations   Impression & Recommendations:  Problem # 1:  HYPERGLYCEMIA (ICD-790.29) 20 minutes spent with patient.  Greater than 15 minutes were spent counseling patient on  DM, Excersise, weight loss and diabetic foot care.  Will add Asa 81 mg for cardiac protection, repeat A1C, lipids, CMET and check urine microalbumin.  Patient is already on ACE, up to date on eye exam, and is scheduled to meet with nutritionist.  Patient will return for meter teaching with nurse and is to follow up in 3 months with Dr. Linford Arnold- sooner if questions/concerns Orders: T-Comprehensive Metabolic Panel [id number] [id number]) T-Hgb A1C [phone]) T-Lipid Profile ([id number]) T-Urine Microalbumin w/creat. ratio [id number])  Complete Medication List: 1)  Lisinopril-hydrochlorothiazide 20-25 Mg Tabs (Lisinopril-hydrochlorothiazide) .... Take 1 tablet by mouth once a day 2)  Tru-trak Test Strips  .... Test twice daily 3)  Lancets Misc (Lancets) 4)  Aspirin 81 Mg Tabs (Aspirin) .... One tablet by mouth daily  Patient Instructions: 1)  Check sugars at least once a day. 2)  Keep a log of your sugars in a notebook along with the date and time. Bring this log to your next appointment. 3)  Check your sugar if you develops symptoms of low blood sugar (weak, sweaty, dizzy)  4)  If you develop sugar under 80 drink a glass of OJ or have a snack and recheck your sugar in 30 minutes.   5)  Goal sugars fasting are between 80 and 110, goal sugars after meals are under 150. 6)  Call us if you have sugars less than 80 or over 300. 7)  Return fasting for your blood work. 8)  Follow up with nutrition  as scheduled. 9)  Schedule a nurse visit to learn how to use your meter. 10)  Follow up with Dr. Linford Arnold in 3 months. Prescriptions: LANCETS  MISC (LANCETS)   #1 box x 2   Entered and Authorized by:   Lemont Fillers FNP   Signed by:   Lemont Fillers FNP on 07/02/2009   Method used:   Printed then [facility]ed to ...       [facility]  [address] [phone]* (retail)       [address] [place], [place]  [address]       Ph: [phone] or [phone]       Fax: [phone]   RxID:    [id number] TRU-TRAK TEST STRIPS test twice daily  #100 x 2   Entered and Authorized by:   Lemont Fillers FNP   Signed by:   Lemont Fillers FNP on 07/02/2009   Method used:   Printed then faxed to ...       [facility]  [address] [phone]* (retail)       [address] [place], [place]  [address]       Ph: [phone] or [phone]       Fax: [phone]   RxID:   [id number]

## 2010-02-25 NOTE — Assessment & Plan Note (Signed)
Summary: Mood, skin lesion   Vital Signs:  Patient profile:   50 year old female Height:      60.5 inches Weight:      173 pounds BMI:     33.35 Pulse rate:   88 / minute BP sitting:   150 / 69  (left arm) Cuff size:   regular  Vitals Entered By: Kathlene November (September 07, 2008 8:54 AM) CC: hospital follow-up seen for chest pain, nausea, dizzy, some acid reflux and was dx with stomach problems and given Prevacid- but pt hasn't taken it cause hasn't had anymore episodes   Primary Care Provider:  Linford Arnold, C  CC:  hospital follow-up seen for chest pain, nausea, dizzy, and some acid reflux and was dx with stomach problems and given Prevacid- but pt hasn't taken it cause hasn't had anymore episodes.  History of Present Illness: hospital follow-up seen for chest pain, nausea, dizzy, some acid reflux and was dx with stomach problems and given Prevacid- but pt hasn't taken it cause hasn't had anymore episodes.  Was sent saturday in the ED at [facility].  Feels like stress related.  Would like something to help her relax. Stressful situation with her ex-husband.  Has taken meds like this before for panic attacks and hypervenitlating.  Started doing that again yesterday.  Sleeping well. Doesnt feel down.  Does snore. No AM HA.  No excessive daytime sleepiness. Her daughter is getting ready to go back to school. will need to call and get ED records for further evaluation.   Right upper shoulder got alesion after her first flu shot but lately has been ithcy.  It feels better with bacitracin.   Current Medications (verified): 1)  Lisinopril-Hydrochlorothiazide 20-25 Mg Tabs (Lisinopril-Hydrochlorothiazide) .... Take 1 Tablet By Mouth Once A Day 2)  Prevacid 30 Mg Cpdr (Lansoprazole) .... Take One Tablt By Mouth Daily  Allergies (verified): 1)  ! Ceclor 2)  ! Sulfa 3)  ! Tetracycline 4)  ! Pcn  Comments:  Nurse/Medical Assistant: The patient's medications and allergies were reviewed with the  patient and were updated in the Medication and Allergy Lists. Kathlene November (September 07, 2008 8:56 AM)  Physical Exam  General:  Well-developed,well-nourished,in no acute distress; alert,appropriate and cooperative throughout examination Lungs:  Normal respiratory effort, chest expands symmetrically. Lungs are clear to auscultation, no crackles or wheezes. Heart:  Normal rate and regular rhythm. S1 and S2 normal without gallop, murmur, click, rub or other extra sounds. Skin:  Right upper shoulder over the deltoid with a sma 0.5cm pink and light brown firm papule.  Psych:  Cognition and judgment appear intact. Alert and cooperative with normal attention span and concentration. No apparent delusions, illusions, hallucinations   Impression & Recommendations:  Problem # 1:  PANIC ATTACK (ICD-300.01) Assessment New Discussed will treat her episodes acutely for now but if feels her stress is interfeering wiht her life and feels is using more than 15 tabs per month of the medication then needs a fu ov to discuss furrhter tx with an SSRI. She doesn' have any clinical signs of depression at this point.  Her updated medication list for this problem includes:    Clonazepam 0.5 Mg Tabs (Clonazepam) .[name]... Take 1 tablet by mouth once a day as needed for anxiety  Problem # 2:  DERMATOFIBROMA (ICD-216.9) Assessment: New I think the lesion is a DM but becuase it has been itchy and bothersome I recommend a bx to rule out Basal cell.   Complete Medication  List: 1)  Lisinopril-hydrochlorothiazide 20-25 Mg Tabs (Lisinopril-hydrochlorothiazide) .... Take 1 tablet by mouth once a day 2)  Prevacid 30 Mg Cpdr (Lansoprazole) .... Take one tablt by mouth daily 3)  Clonazepam 0.5 Mg Tabs (Clonazepam) .... Take 1 tablet by mouth once a day as needed for anxiety Prescriptions: CLONAZEPAM 0.5 MG TABS (CLONAZEPAM) Take 1 tablet by mouth once a day as needed for anxiety  #15 x 0   Entered and Authorized by:   Nani Gasser MD   Signed by:   Nani Gasser MD on 09/07/2008   Method used:   Print then Give to Patient   RxID:   [id number]

## 2010-02-25 NOTE — Assessment & Plan Note (Signed)
Summary: fu htn, chol   Vital Signs:  Patient Profile:   50 Years Old Female Height:     60.5 inches Weight:      174 pounds Pulse rate:   81 / minute BP sitting:   130 / 70  (left arm) Cuff size:   regular  Vitals Entered By: Kathlene November (June 30, 2007 5:20 PM)                 PCP:  Cipriano Bunker  Chief Complaint:  BP check.  History of Present Illness: Still exercising nightly. Has gained 2 pounds since last OV.  Hypertension History:      She denies headache, chest pain, palpitations, dyspnea with exertion, orthopnea, PND, peripheral edema, visual symptoms, neurologic problems, syncope, and side effects from treatment.  She notes no problems with any antihypertensive medication side effects.        Positive major cardiovascular risk factors include hyperlipidemia and hypertension.  Negative major cardiovascular risk factors include female age less than 1 years old, no history of diabetes, negative family history for ischemic heart disease, and non-tobacco-user status.        Further assessment for target organ damage reveals no history of ASHD, cardiac end-organ damage (CHF/LVH), stroke/TIA, peripheral vascular disease, renal insufficiency, or hypertensive retinopathy.       Current Allergies: ! CECLOR ! SULFA ! TETRACYCLINE      Physical Exam  General:     Well-developed,well-nourished,in no acute distress; alert,appropriate and cooperative throughout examination Lungs:     Normal respiratory effort, chest expands symmetrically. Lungs are clear to auscultation, no crackles or wheezes. Heart:     Normal rate and regular rhythm. S1 and S2 normal without gallop, murmur, click, rub or other extra sounds. Pulses:     RAdial 2+ Extremities:     No LE edma.     Impression & Recommendations:  Problem # 1:  HYPERTENSION, BENIGN SYSTEMIC (ICD-401.1) AT goal but up from last visit. Reminded her to be consistant with her exercise and low salt diet. FU in 6 months.   Her updated medication list for this problem includes:    Lisinopril-hydrochlorothiazide 20-25 Mg Tabs (Lisinopril-hydrochlorothiazide) .[name]... Take 1 tablet by mouth once a day  Orders: T-Comprehensive Metabolic Panel ([id number])   Problem # 2:  HYPERCHOLESTEROLEMIA (ICD-272.0) Taking Fish oil, due ot recheck.  Orders: T-Lipid Profile ([id number])   Complete Medication List: 1)  Lisinopril-hydrochlorothiazide 20-25 Mg Tabs (Lisinopril-hydrochlorothiazide) .... Take 1 tablet by mouth once a day 2)  Metrogel 1 % Gel (Metronidazole) .... Apply daily to affected area 3)  Keflex 500 Mg Caps (Cephalexin) .... Take 1 tablet by mouth four times a day for  10 days.  Hypertension Assessment/Plan:      The patient's hypertensive risk group is category B: At least one risk factor (excluding diabetes) with no target organ damage.  Her calculated 10 year risk of coronary heart disease is 4 %.  Today's blood pressure is 130/70.  Her blood pressure goal is < 140/90.    Prescriptions: LISINOPRIL-HYDROCHLOROTHIAZIDE 20-25 MG TABS (LISINOPRIL-HYDROCHLOROTHIAZIDE) Take 1 tablet by mouth once a day  #30 x 8   Entered and Authorized by:   Nani Gasser MD   Signed by:   Nani Gasser MD on 06/30/2007   Method used:   Electron[facility] to ...       [facility]  [address] [phone]*       [address]  [place], [place]         Ph: [phone] or [phone]       Fax: [phone]   RxID:   [id number]  ]

## 2010-02-25 NOTE — Letter (Signed)
Summary: Med Link  Med Link   Imported By: Lanelle Bal 07/12/2009 11:51:42  _____________________________________________________________________  External Attachment:    Type:   Image     Comment:   External Document

## 2010-02-25 NOTE — Letter (Signed)
Summary: Lipid Letter  [facility]  [address], [address]   [place], [place] [address]   Phone: [phone]  Fax: [phone]    08/05/2006  Maria Nunez [address] [place], [place]  [address]  Dear Maria Nunez:  We have carefully reviewed your last lipid profile from 08/04/2006 and the results are noted below with a summary of recommendations for lipid management.    Cholesterol:      234     Goal: <200   HDL "good" Cholesterol:   51     Goal: >40   LDL "bad" Cholesterol:     147     Goal: <160   Triglycerides:      178     Goal: <150    Your lipid goals have not been met; we recommend improving on your TLC diet and Adjunctive Measures (see below) and make the following changes to your regimen.  Your total is too high and your triglycerides are too high.  Fish oil ( 2 tabs with each meal) will lower your triglycerides significantly.  WE may need to look at starting a cholesterol lowering medication as well to decrease your total and your LDL.  Lets recheck your labs in 4 months.    Your blood sugar is mildly elevated so we should recheck this again in 6 months.  It is a sign of insulin resistance or pre-diabetes.    Your hormone levels are normal.  Thus you are not post menopausal but you could be peri-menopausal.    TLC Diet (Therapeutic Lifestyle Change): Total Fat should be no greater than 25-35% of total calories Carbohydrates should be 50-60% of total calories Protein should be approximately 15% of total calories Fiber should be at least 20-30 grams a day ***Increased fiber may help lower LDL Total Cholesterol should be < 200mg /day Consider adding plant stanol/sterols to diet (example: Benacol spread) ***A higher intake of unsaturated fat may reduce Triglycerides and Increase HDL    Adjunctive Measures (may lower LIPIDS and reduce risk of Heart Attack) include: Aerobic Exercise (20-30 minutes 3-4 times a week) Limit Alcohol Consumption  Weight Reduction Folic Acid 1mg  a day by mouth Dietary Fiber 20-30 grams a day by mouth             Current Medications: 1)    Lisinopril-hydrochlorothiazide 20-25 Mg Tabs (Lisinopril-hydrochlorothiazide) .... Take 1 tablet by mouth once a day 2)    Metrogel 1 % Gel (Metronidazole) .... Apply daily to affected area  If you have any questions, please call. We appreciate being able to work with you.   Sincerely,    [facility] Nani Gasser MD

## 2010-02-25 NOTE — Progress Notes (Signed)
Summary: Blood Sugar Log  Blood Sugar Log   Imported By: Maryln Gottron 12/30/2009 15:36:01  _____________________________________________________________________  External Attachment:    Type:   Image     Comment:   External Document

## 2010-02-25 NOTE — Assessment & Plan Note (Signed)
Summary: Abscess abdomen. cyst on right breast.    Vital Signs:  Patient Profile:   50 Years Old Female Height:     60.5 inches Weight:      171 pounds Pulse rate:   84 / minute BP sitting:   125 / 68  (left arm) Cuff size:   regular  Vitals Entered By: Kathlene November (November 25, 2006 2:46 PM)                 PCP:  Cipriano Bunker  Chief Complaint:  cyst on right side of abdomen x 3 weeks has been nursing it herself. Also noticed a cyst on her right breast- states she has had these before on the breast.  History of Present Illness: Right breast cyst 3 years ago.  Has had 4 removed over the last 10 years.  Dr. Garnette Czech removed the last one in [facility].  Prior ones were removed in [place].     Now has cyst on right abdomen for 3 weeks, not healing on its own.  Has been "doctoring" it along.  No drainage over these 3 weeks.    Current Allergies: ! CECLOR ! SULFA ! TETRACYCLINE   Family History:    Reviewed history from 12/03/2005 and no changes required:       Brother-Alcoholism,        Father-DM, HTN,        Mother-DM, HTN,        Pat Aunt-BrCa  Social History:    Reviewed history from 11/03/2005 and no changes required:       Best boy at [employer]. 2 yrs college education.  Divorced.  2 daughters.  Never smoked.  No EtOH, no drugs, 2 caffeinated drinks per day.  Walks daily.     Physical Exam  General:     Well-developed,well-nourished,in no acute distress; alert,appropriate and cooperative throughout examination Head:     Normocephalic and atraumatic without obvious abnormalities. No apparent alopecia or balding. Eyes:     No corneal or conjunctival inflammation noted. EOMI. Perrla.  Breasts:     Right breast cyst approx 1cm that is located at 3 o'clock 4 cm from the edge or areola.   Skin:     Right abdomen with 2 cm erythematous lesion on skin at abdomenal fold.  No drainage.  No significant induration.      Impression &  Recommendations:  Problem # 1:  BREAST CYST (ICD-610.0) Will refer to [facility] in [place] for surgical excision of cyst.  Will set up w/ a surgeon that does breast work. Call if any redness over skin.   Orders: Surgical Referral (Surgery)   Problem # 2:  ABSCESS, SKIN (ICD-682.9) Not induration or swelling to attempt drainage of lesion but not healing after 3 weeks.  Call if not improving after 4 days. Can change to doxy to cover MRSA if not getting better.  Her updated medication list for this problem includes:    Keflex 500 Mg Caps (Cephalexin) .[name]... Take 1 tablet by mouth four times a day for  10 days.   Complete Medication List: 1)  Lisinopril-hydrochlorothiazide 20-25 Mg Tabs (Lisinopril-hydrochlorothiazide) .... Take 1 tablet by mouth once a day 2)  Metrogel 1 % Gel (Metronidazole) .... Apply daily to affected area 3)  Keflex 500 Mg Caps (Cephalexin) .... Take 1 tablet by mouth four times a day for  10 days.     Prescriptions: KEFLEX 500 MG  CAPS (CEPHALEXIN) Take 1 tablet by  mouth four times a day for  10 days.  #40 x 0   Entered and Authorized by:   Nani Gasser MD   Signed by:   Nani Gasser MD on 11/25/2006   Method used:   Electronically sent to ...       [facility]  [address] [id number]       [address] [place], [place]         Ph: [phone] or [phone]       Fax: [phone]   RxID:   [id number]  ]

## 2010-02-25 NOTE — Consult Note (Signed)
Summary: [facility]  [facility]   Imported By: Lanelle Bal 08/21/2009 09:46:46  _____________________________________________________________________  External Attachment:    Type:   Image     Comment:   External Document

## 2010-03-06 ENCOUNTER — Other Ambulatory Visit: Payer: Self-pay | Admitting: Obstetrics and Gynecology

## 2010-03-06 DIAGNOSIS — Z1231 Encounter for screening mammogram for malignant neoplasm of breast: Secondary | ICD-10-CM

## 2010-03-25 ENCOUNTER — Encounter (HOSPITAL_BASED_OUTPATIENT_CLINIC_OR_DEPARTMENT_OTHER)
Admission: RE | Admit: 2010-03-25 | Discharge: 2010-03-25 | Disposition: A | Discharge status: Home / Self Care | Payer: 59 | Source: Ambulatory Visit | Attending: Surgery | Admitting: Surgery

## 2010-03-25 DIAGNOSIS — Z01812 Encounter for preprocedural laboratory examination: Secondary | ICD-10-CM | POA: Insufficient documentation

## 2010-03-25 DIAGNOSIS — Z01810 Encounter for preprocedural cardiovascular examination: Secondary | ICD-10-CM | POA: Insufficient documentation

## 2010-03-25 LAB — BASIC METABOLIC PANEL
BUN: 15 mg/dL (ref 6–23)
CO2: 26 mEq/L (ref 19–32)
Calcium: 9.5 mg/dL (ref 8.4–10.5)
Chloride: 104 mEq/L (ref 96–112)
Creatinine, Ser: 0.73 mg/dL (ref 0.4–1.2)
GFR calc Af Amer: >60 mL/min (ref >60)
GFR calc non Af Amer: >60 mL/min (ref >60)
Glucose, Bld: 126 mg/dL — ABNORMAL HIGH (ref 70–99)
Potassium: 3.8 mEq/L (ref 3.5–5.1)
Sodium: 137 mEq/L (ref 135–145)

## 2010-03-28 ENCOUNTER — Ambulatory Visit (HOSPITAL_BASED_OUTPATIENT_CLINIC_OR_DEPARTMENT_OTHER)
Admission: RE | Admit: 2010-03-28 | Discharge: 2010-03-28 | Disposition: A | Discharge status: Home / Self Care | Payer: 59 | Source: Ambulatory Visit | Attending: Surgery | Admitting: Surgery

## 2010-03-28 ENCOUNTER — Other Ambulatory Visit: Payer: Self-pay | Admitting: Surgery

## 2010-03-28 DIAGNOSIS — I10 Essential (primary) hypertension: Secondary | ICD-10-CM | POA: Insufficient documentation

## 2010-03-28 DIAGNOSIS — Z01810 Encounter for preprocedural cardiovascular examination: Secondary | ICD-10-CM | POA: Insufficient documentation

## 2010-03-28 DIAGNOSIS — Z888 Allergy status to other drugs, medicaments and biological substances status: Secondary | ICD-10-CM | POA: Insufficient documentation

## 2010-03-28 DIAGNOSIS — Z79899 Other long term (current) drug therapy: Secondary | ICD-10-CM | POA: Insufficient documentation

## 2010-03-28 DIAGNOSIS — L732 Hidradenitis suppurativa: Secondary | ICD-10-CM | POA: Insufficient documentation

## 2010-03-28 DIAGNOSIS — Z882 Allergy status to sulfonamides status: Secondary | ICD-10-CM | POA: Insufficient documentation

## 2010-03-28 DIAGNOSIS — Z7982 Long term (current) use of aspirin: Secondary | ICD-10-CM | POA: Insufficient documentation

## 2010-03-28 LAB — POCT HEMOGLOBIN-HEMACUE: Hemoglobin: 13.6 g/dL (ref 12.0–15.0)

## 2010-03-30 ENCOUNTER — Encounter: Payer: Self-pay | Admitting: *Deleted

## 2010-03-31 ENCOUNTER — Encounter: Payer: Self-pay | Admitting: Family Medicine

## 2010-04-04 NOTE — Op Note (Signed)
  [name], [name]           ACCOUNT NO.:  [id number]  MEDICAL RECORD NO.:  [id number]          PATIENT TYPE:  LOCATION:                                 FACILITY:  PHYSICIAN:  Abigail Miyamoto, M.D.      DATE OF BIRTH:  DATE OF PROCEDURE:  03/28/2010 DATE OF DISCHARGE:                              OPERATIVE REPORT   PREOPERATIVE DIAGNOSIS:  Chronic hidradenitis bilateral groins.  POSTOPERATIVE DIAGNOSIS:  Chronic hidradenitis bilateral groins.  PROCEDURE:  Wide excision of hidradenitis, bilateral groins.  SURGEON:  Abigail Miyamoto, MD  ANESTHESIA:  General and 0.5% Marcaine.  ESTIMATED BLOOD LOSS:  Minimal.  INDICATIONS:  Ms. Beilfuss is a 50 year old female who continues to have chronic hidradenitis draining in both of her groins near the labia. Decision was made to proceed with wide excision of these areas.  PROCEDURE IN DETAIL:  The patient was brought to operating room, identified as Maria Nunez.  She was placed supine on the operative table and general anesthesia was induced.  She was then placed in the frog-leg position.  Her groins were then shaved and then prepped and draped in usual sterile fashion.  She had chronic skin changes in both the groins lateral to the labia just at the area of the mons.  I anesthetized the skin of both sides with Marcaine.  I then did elliptical incisions on both sides with #15 blade, taking this down into the subcutaneous tissue.  I then completed the wide excision with electrocautery going down into the deep subcutaneous tissue to remove all the chronic draining sinus tracts.  Both of these areas were sent to Pathology for evaluation.  I then achieved hemostasis with cautery.  I closed both wounds with subcutaneous 3-0 Vicryl sutures and interrupted 2-0 nylon sutures.  Gauze and tape were applied.  The patient tolerated the procedure well.  All counts were correct at the end of the procedure.  The patient was then  extubated in the operating room and taken in stable condition to recovery room.    Abigail Miyamoto, M.D.   ______________________________ Abigail Miyamoto, M.D.   DB/MEDQ  D:  03/28/2010  T:  03/29/2010  Job:  [id number]  Electronically Signed by Abigail Miyamoto M.D. on 04/04/2010 07:08:52 AM

## 2010-04-14 ENCOUNTER — Ambulatory Visit
Admission: RE | Admit: 2010-04-14 | Discharge: 2010-04-14 | Disposition: A | Discharge status: Home / Self Care | Payer: 59 | Source: Ambulatory Visit | Attending: Obstetrics and Gynecology | Admitting: Obstetrics and Gynecology

## 2010-04-14 DIAGNOSIS — Z1231 Encounter for screening mammogram for malignant neoplasm of breast: Secondary | ICD-10-CM

## 2010-04-15 NOTE — Miscellaneous (Signed)
Summary: [facility]  [facility]   Imported By: Maryln Gottron 04/07/2010 15:37:25  _____________________________________________________________________  External Attachment:    Type:   Image     Comment:   External Document

## 2010-04-17 ENCOUNTER — Other Ambulatory Visit: Payer: Self-pay | Admitting: Obstetrics and Gynecology

## 2010-04-17 DIAGNOSIS — R928 Other abnormal and inconclusive findings on diagnostic imaging of breast: Secondary | ICD-10-CM

## 2010-04-22 ENCOUNTER — Ambulatory Visit
Admission: RE | Admit: 2010-04-22 | Discharge: 2010-04-22 | Disposition: A | Discharge status: Home / Self Care | Payer: 59 | Source: Ambulatory Visit | Attending: Obstetrics and Gynecology | Admitting: Obstetrics and Gynecology

## 2010-04-22 DIAGNOSIS — R928 Other abnormal and inconclusive findings on diagnostic imaging of breast: Secondary | ICD-10-CM

## 2010-05-12 ENCOUNTER — Other Ambulatory Visit: Payer: Self-pay | Admitting: Family Medicine

## 2010-05-23 ENCOUNTER — Other Ambulatory Visit: Payer: Self-pay | Admitting: Obstetrics and Gynecology

## 2010-07-10 ENCOUNTER — Other Ambulatory Visit: Payer: Self-pay | Admitting: Family Medicine

## 2010-08-05 ENCOUNTER — Encounter (INDEPENDENT_AMBULATORY_CARE_PROVIDER_SITE_OTHER): Payer: Self-pay | Admitting: Surgery

## 2010-08-06 ENCOUNTER — Encounter (INDEPENDENT_AMBULATORY_CARE_PROVIDER_SITE_OTHER): Payer: Self-pay | Admitting: Surgery

## 2010-08-06 ENCOUNTER — Ambulatory Visit (INDEPENDENT_AMBULATORY_CARE_PROVIDER_SITE_OTHER): Payer: Commercial Managed Care - PPO | Admitting: Surgery

## 2010-08-06 VITALS — BP 134/88 | HR 68 | Temp 96.7°F | Ht 59.0 in | Wt 171.0 lb

## 2010-08-06 DIAGNOSIS — T8141XA Infection following a procedure, superficial incisional surgical site, initial encounter: Secondary | ICD-10-CM

## 2010-08-06 DIAGNOSIS — T8140XA Infection following a procedure, unspecified, initial encounter: Secondary | ICD-10-CM

## 2010-08-06 NOTE — Progress Notes (Signed)
Subjective:     Patient ID: Maria Nunez, female   DOB: October 09, 1960, 50 y.o.   MRN: [id number]    BP 134/88  Pulse 68  Temp 96.7 F (35.9 C)  Ht 4\' 11"  (1.499 m)  Wt 171 lb (77.565 kg)  BMI 34.54 kg/m2    HPI She is here for any extended postoperative visit. She had excision of hidradenitis in both her groins back in March. She has developed a draining area on each side of her old incision. She denies fever or chills.  Review of Systems     Objective:   Physical Exam    On exam, she does have a small open area  at each incision. I probed both areas and found no retained suture material. I then treated areas and silver nitrate.Assessment:        Impression: Probable stitch abscess Plan:    I will place her on Bactrim DS b.i.d. for the next 10 days. I will see her back in 2 weeks.

## 2010-08-26 ENCOUNTER — Encounter (INDEPENDENT_AMBULATORY_CARE_PROVIDER_SITE_OTHER): Payer: Commercial Managed Care - PPO | Admitting: Surgery

## 2010-10-21 ENCOUNTER — Encounter: Payer: Self-pay | Admitting: Family Medicine

## 2010-10-27 ENCOUNTER — Ambulatory Visit (INDEPENDENT_AMBULATORY_CARE_PROVIDER_SITE_OTHER): Payer: 59 | Admitting: Family Medicine

## 2010-10-27 ENCOUNTER — Encounter: Payer: Self-pay | Admitting: Family Medicine

## 2010-10-27 VITALS — BP 146/84 | HR 89 | Ht 59.0 in | Wt 175.0 lb

## 2010-10-27 DIAGNOSIS — I10 Essential (primary) hypertension: Secondary | ICD-10-CM

## 2010-10-27 LAB — LIPID PANEL
Cholesterol: 193 mg/dL (ref 0–200)
HDL: 47 mg/dL (ref >39)
LDL Cholesterol: 122 mg/dL — ABNORMAL HIGH (ref 0–99)
Total CHOL/HDL Ratio: 4.1 Ratio
Triglycerides: 119 mg/dL (ref <150)
VLDL: 24 mg/dL (ref 0–40)

## 2010-10-27 LAB — COMPLETE METABOLIC PANEL WITH GFR
ALT: 12 U/L (ref 0–35)
AST: 13 U/L (ref 0–37)
Albumin: 3.8 g/dL (ref 3.5–5.2)
Alkaline Phosphatase: 57 U/L (ref 39–117)
BUN: 13 mg/dL (ref 6–23)
CO2: 23 mEq/L (ref 19–32)
Calcium: 9.1 mg/dL (ref 8.4–10.5)
Chloride: 105 mEq/L (ref 96–112)
Creat: 0.58 mg/dL (ref 0.50–1.10)
GFR, Est African American: >60 mL/min (ref >60)
GFR, Est Non African American: >60 mL/min (ref >60)
Glucose, Bld: 106 mg/dL — ABNORMAL HIGH (ref 70–99)
Potassium: 3.7 mEq/L (ref 3.5–5.3)
Sodium: 139 mEq/L (ref 135–145)
Total Bilirubin: 0.3 mg/dL (ref 0.3–1.2)
Total Protein: 6.1 g/dL (ref 6.0–8.3)

## 2010-10-27 LAB — TSH: TSH: 1.119 u[IU]/mL (ref 0.350–4.500)

## 2010-10-27 MED ORDER — LOSARTAN POTASSIUM-HCTZ 50-12.5 MG PO TABS: 1.0000 | ORAL_TABLET | Freq: Every day | ORAL | Status: DC

## 2010-10-27 NOTE — Patient Instructions (Signed)
Let me know when you are ready for your colonoscopy.

## 2010-10-27 NOTE — Progress Notes (Signed)
  Subjective:    Patient ID: Maria Nunez, female    DOB: May 11, 1960, 50 y.o.   MRN: [id number]  HPI  HTN - Has been getting left ankle swelling so stopped it for a few days and it got better . BP have been in the 120s at home. No CP or SOB.  She's not had any pain in the ankle but it just started swelling since she started the medication. It did get better off the medication when she skipped it for a few days. What strange about this and that the medication does have a diuretic and if anything you think that this would help swelling. No chest pain or shortness of breath. She is otherwise very compliant with her medications. She is trying to eat more healthy overall.  Here A1C is down to 5.7. She is working with a Financial planner through work to improve her sugars. This is fantastic.  Review of Systems     Objective:   Physical Exam  Constitutional: She is oriented to person, place, and time. She appears well-developed and well-nourished.  HENT:  Head: Normocephalic and atraumatic.  Eyes: Conjunctivae are normal. Pupils are equal, round, and reactive to light.  Neck: Neck supple. No thyromegaly present.  Cardiovascular: Normal rate, regular rhythm and normal heart sounds.        Soft 1/6 SEM.   Pulmonary/Chest: Effort normal and breath sounds normal.  Lymphadenopathy:    She has no cervical adenopathy.  Neurological: She is alert and oriented to person, place, and time.  Skin: Skin is warm and dry.  Psychiatric: She has a normal mood and affect. Her behavior is normal.          Assessment & Plan:  HTN- Not well controlled today but off her meds for 3 days. Will change to losartan HCT and f/u in 3 moths. She is checking her BP at home. Pt to call sooner if swelling recurs or if BP not well controlled. Also reminded her to make sure she is eating a low salt diet. She can call sooner if her blood pressures are elevated or if the medication causes swelling again.

## 2010-10-28 ENCOUNTER — Telehealth: Payer: Self-pay | Admitting: *Deleted

## 2010-10-28 MED ORDER — PRAVASTATIN SODIUM 40 MG PO TABS: 40.0000 mg | ORAL_TABLET | Freq: Every evening | ORAL | Status: DC

## 2010-10-28 NOTE — Telephone Encounter (Signed)
Pt notified of results. Med sent to [facility] pharmacy

## 2010-10-28 NOTE — Telephone Encounter (Signed)
Message copied by Lanae Crumbly on Tue Oct 28, 2010 10:05 AM ------      Message from: Nani Gasser D      Created: Mon Oct 27, 2010  8:22 PM       CMP is OK.  Chol is ok except LDL is 122. I rec inc pravastatin to 40mg . If ok then can send to pharm a 6 mo supply and then recheck LDL in 6 mo.

## 2010-10-29 ENCOUNTER — Telehealth: Payer: Self-pay | Admitting: Family Medicine

## 2010-10-29 ENCOUNTER — Encounter: Payer: Self-pay | Admitting: Family Medicine

## 2010-10-29 ENCOUNTER — Telehealth: Payer: Self-pay | Admitting: *Deleted

## 2010-10-29 ENCOUNTER — Ambulatory Visit (INDEPENDENT_AMBULATORY_CARE_PROVIDER_SITE_OTHER): Payer: 59 | Admitting: Family Medicine

## 2010-10-29 VITALS — BP 125/72 | HR 76 | Temp 98.1°F | Wt 174.0 lb

## 2010-10-29 DIAGNOSIS — R109 Unspecified abdominal pain: Secondary | ICD-10-CM

## 2010-10-29 LAB — CBC WITH DIFFERENTIAL/PLATELET
HCT: 38.1 % (ref 36.0–46.0)
Hemoglobin: 12.7 g/dL (ref 12.0–15.0)
Lymphocytes Relative: 18 % (ref 12–46)
Lymphs Abs: 1.6 10*3/uL (ref 0.7–4.0)
MCH: 29.5 pg (ref 26.0–34.0)
MCHC: 33.4 g/dL (ref 30.0–36.0)
MCV: 88.6 fL (ref 78.0–100.0)
Monocytes Absolute: 0.3 10*3/uL (ref 0.1–1.0)
Monocytes Relative: 2 % — ABNORMAL LOW (ref 3–12)
Neutro Abs: 7.2 10*3/uL (ref 1.7–7.7)
Neutrophils Relative %: 80 % — ABNORMAL HIGH (ref 43–77)
Platelets: 295 10*3/uL (ref 150–400)
RBC: 4.3 MIL/uL (ref 3.87–5.11)
RDW: 12.8 % (ref 11.5–15.5)
WBC: 9.1 10*3/uL (ref 4.0–10.5)

## 2010-10-29 LAB — POCT URINALYSIS DIPSTICK
Bilirubin, UA: NEGATIVE
Blood, UA: NEGATIVE
Glucose, UA: NEGATIVE
Ketones, UA: NEGATIVE
Leukocytes, UA: NEGATIVE
Nitrite, UA: NEGATIVE
Protein, UA: NEGATIVE
Spec Grav, UA: 1.01
Urobilinogen, UA: 0.2
pH, UA: 6.5

## 2010-10-29 NOTE — Patient Instructions (Signed)
Call if you are not getting better or suddenly get worse.

## 2010-10-29 NOTE — Telephone Encounter (Signed)
OK to double book for today.

## 2010-10-29 NOTE — Telephone Encounter (Signed)
Pt notified. KJ LPN 

## 2010-10-29 NOTE — Telephone Encounter (Signed)
Pt informed to come now to office for work in and [place] up front notified to have add on appt @ 10:00am per Dr. Linford Arnold order.

## 2010-10-29 NOTE — Progress Notes (Signed)
  Subjective:    Patient ID: Maria Nunez, female    DOB: [birth year], 50 y.o.   MRN: [id number]  HPI Is having left low back pain on and off for the last 5 days. No dysuria, hematuia, pelvic pain.  Last night was spreading up her back and feels hard to breath. No fever. No SOB today.  Wose with deep breath.  Never had kidney stones , but + family hx.  thoguth it could be a pulled muscle. Takes Advil - Helps some. No diaphoresis or nausea. No recent change in bowels. She did try to go to urgent care last night but they were unable to help her so she left.   Review of Systems     Objective:   Physical Exam  Constitutional: She is oriented to person, place, and time. She appears well-developed and well-nourished.  HENT:  Head: Normocephalic.  Cardiovascular: Normal rate, regular rhythm and normal heart sounds.   Pulmonary/Chest: Effort normal and breath sounds normal.  Abdominal: Soft. Bowel sounds are normal. She exhibits no distension and no mass. There is no tenderness. There is no rebound and no guarding.  Musculoskeletal:       She has normal range of motion of her lumbar spine. She is nontender over the lumbar or thoracic spine. No tenderness of the paraspinous muscles. No CVA tenderness.  Neurological: She is alert and oriented to person, place, and time.  Skin: Skin is warm and dry.  Psychiatric: She has a normal mood and affect. Her behavior is normal.          Assessment & Plan:  Back pain - UA is neg.  Will check CBC to rule out infectious cause. I suspect at this point in time that it is likely a muscle strain. Recommend she use her Advil and a heating pad to help improve her pain. She declined anything stronger such as a muscle relaxer pain medication. If she does not improve or she continues to get worse and call our office back. She suddenly started having urinary symptoms or hematuria the post office as well. She has no personal history of kidney stones but does have  a family history.

## 2010-10-29 NOTE — Telephone Encounter (Signed)
Message copied by Lanae Crumbly on Wed Oct 29, 2010  9:53 AM ------      Message from: Nani Gasser D      Created: Tue Oct 28, 2010  5:34 PM       Thyroid looks normal. See other note below.

## 2010-10-29 NOTE — Telephone Encounter (Signed)
Pt called and stated she went to our UC in our building last night and she had been experiencing left lower sided pain since last Thursday, no fever, hurts to breathe in.  She rates her pain #3/10 and describes the pain as dull.  Also describes this pain as intermittent in nature. Pt was told her urine couldn't be dipped and no sonogram was scheduled for the pt because she was told [facility] was closed.  We have no appts til next Monday and obviously the patient needs to be taken care of.  Should we send her to the ED. Plan:  Routed to the provider for further recommendations. Jarvis Newcomer, LPN [facility]

## 2010-10-31 ENCOUNTER — Telehealth: Payer: Self-pay | Admitting: *Deleted

## 2010-10-31 NOTE — Telephone Encounter (Signed)
Message copied by Wyline Beady on Fri Oct 31, 2010 11:48 AM ------      Message from: Nani Gasser D      Created: Wed Oct 29, 2010  1:01 PM       Her white count is normal. No sign of infection. If her pain is persisting and still present in about a week please let me know or if she feels she is getting worse call me sooner.

## 2010-10-31 NOTE — Telephone Encounter (Signed)
LMOM with results and to call back to let us know how she is feeling or if sxs worsen

## 2010-12-03 ENCOUNTER — Other Ambulatory Visit: Payer: Self-pay | Admitting: Obstetrics and Gynecology

## 2010-12-25 ENCOUNTER — Other Ambulatory Visit: Payer: Self-pay | Admitting: *Deleted

## 2010-12-25 MED ORDER — LOSARTAN POTASSIUM-HCTZ 50-12.5 MG PO TABS: 1.0000 | ORAL_TABLET | Freq: Every day | ORAL | Status: DC

## 2010-12-30 ENCOUNTER — Other Ambulatory Visit: Payer: Self-pay | Admitting: Obstetrics and Gynecology

## 2011-03-09 ENCOUNTER — Other Ambulatory Visit: Payer: Self-pay | Admitting: Obstetrics and Gynecology

## 2011-03-09 DIAGNOSIS — Z1231 Encounter for screening mammogram for malignant neoplasm of breast: Secondary | ICD-10-CM

## 2011-03-30 ENCOUNTER — Other Ambulatory Visit: Payer: Self-pay | Admitting: Family Medicine

## 2011-04-06 ENCOUNTER — Encounter: Payer: Self-pay | Admitting: Physician Assistant

## 2011-04-06 ENCOUNTER — Ambulatory Visit (INDEPENDENT_AMBULATORY_CARE_PROVIDER_SITE_OTHER): Payer: 59 | Admitting: Physician Assistant

## 2011-04-06 DIAGNOSIS — R059 Cough, unspecified: Secondary | ICD-10-CM

## 2011-04-06 DIAGNOSIS — C44319 Basal cell carcinoma of skin of other parts of face: Secondary | ICD-10-CM

## 2011-04-06 DIAGNOSIS — C44310 Basal cell carcinoma of skin of unspecified parts of face: Secondary | ICD-10-CM

## 2011-04-06 DIAGNOSIS — J069 Acute upper respiratory infection, unspecified: Secondary | ICD-10-CM

## 2011-04-06 DIAGNOSIS — R05 Cough: Secondary | ICD-10-CM

## 2011-04-06 MED ORDER — BENZONATATE 100 MG PO CAPS: 100.0000 mg | ORAL_CAPSULE | Freq: Three times a day (TID) | ORAL | Status: AC | PRN

## 2011-04-06 MED ORDER — HYDROCODONE-HOMATROPINE 5-1.5 MG/5ML PO SYRP: 2.5000 mL | ORAL_SOLUTION | Freq: Every evening | ORAL | Status: AC | PRN

## 2011-04-06 NOTE — Progress Notes (Signed)
  Subjective:    Patient ID: Maria Nunez, female    DOB: 02-Nov-1960, 51 y.o.   MRN: [id number]  Cough This is a new problem. The current episode started in the past 7 days. The problem has been gradually worsening. The problem occurs hourly. Associated symptoms include chills, ear congestion, nasal congestion, postnasal drip, rhinorrhea and a sore throat. Pertinent negatives include no chest pain, ear pain, fever, headaches, shortness of breath or wheezing. The symptoms are aggravated by dust, cold air and lying down. She has tried OTC cough suppressant and rest for the symptoms. There is no history of asthma, bronchitis or pneumonia.  URI  This is a new problem. There has been no fever. Associated symptoms include coughing, rhinorrhea and a sore throat. Pertinent negatives include no chest pain, congestion, diarrhea, ear pain, headaches, nausea, sinus pain, sneezing, swollen glands, vomiting or wheezing. She has tried acetaminophen and NSAIDs for the symptoms. The treatment provided no relief.   Patient reports a scar that has changed and become raised with a scales on top. It is located on her right temple. She denies any pain or bleeding. She noticed it had changed in January. She has no history of skin cancer.    Review of Systems  Constitutional: Positive for chills. Negative for fever.  HENT: Positive for sore throat, rhinorrhea and postnasal drip. Negative for ear pain, congestion and sneezing.   Respiratory: Positive for cough. Negative for shortness of breath and wheezing.   Cardiovascular: Negative for chest pain.  Gastrointestinal: Negative for nausea, vomiting and diarrhea.  Neurological: Negative for headaches.       Objective:   Physical Exam  Constitutional: She is oriented to person, place, and time. She appears well-developed and well-nourished.  HENT:  Head: Normocephalic and atraumatic.  Right Ear: External ear normal.  Left Ear: External ear normal.    Mouth/Throat: No oropharyngeal exudate.       Left TM normal. Right TM dull and bulging.Oropharynx with erythema and post nasal drip present. Bilateral nares with swollen turbinate.   Eyes: Conjunctivae are normal.  Neck: Normal range of motion. Neck supple.  Cardiovascular: Normal rate, regular rhythm and normal heart sounds.   Pulmonary/Chest: Effort normal and breath sounds normal. She has no wheezes.  Lymphadenopathy:    She has no cervical adenopathy.  Neurological: She is alert and oriented to person, place, and time.  Skin:       Pearly .5 cm papule with scaling on the temple of right side of head.   Psychiatric: She has a normal mood and affect. Her behavior is normal.          Assessment & Plan:  URI- patient was given tessalon pearle for cough to use up to three times a day and hycodan to use at night. Encouraged to use Mucinex twice a day and drink lots of water along with increasing vitamin C. For sore throat can use chloraseptic spray and salt water garglesfor pain relief. If not better or improving by Friday call office and will give antibiotic.   Basal Cell- I was going to freeze basal cell today but we were out of nitrogen. Will refer to dermatology in [place] for removal.

## 2011-04-06 NOTE — Patient Instructions (Addendum)
Use tessalon pearle for cough during the day up to three times a day. Hycodan at night for cough at bedtime. Consider Vit C to increase immune system and mucinex twice a day drinking lots of water. Call office if not improving by Friday. Will refer to dermatology for basal cell on right temple.   Basal Cell Carcinoma Basal cell carcinoma is the most common form of skin cancer. It begins in the basal cells, which are at the bottom of the outer skin layer (epidermis). CAUSES  Sun exposure is the most common cause of basal cell carcinoma. Basal cell carcinoma occurs most often on parts of the body that are frequently exposed to the sun, including the:  Scalp.   Ears.   Neck.   Face.   Arms.   Backs of the hands.   Legs.  However, basal cell carcinoma can occur anywhere on the body. Rarely, tumors develop on areas not exposed to the sun. Other causes of basal cell carcinoma can include:  Exposure to arsenic.   Exposure to radiation.   Certain genetic syndromes, such as xeroderma pigmentosum.  RISK FACTORS People at highest risk for basal cell carcinoma include those with:  Fair skin.   Blonde or red hair.   Blue, green, or gray eyes.   Childhood freckling.  Factors that increase your risk for basal cell carcinoma include:  Sun exposure over long periods of time. Childhood sun exposure appears to be a more significant factor than sun exposure as an adult.   Repeated sunburns.   Use of tanning beds.   Having a weakened immune system.  SYMPTOMS Five signs of basal cell carcinoma are:  An open sore that bleeds, oozes, or crusts. The sore may remain open for 3 or more weeks. This can be an early sign of basal cell carcinoma. Basal cell carcinoma can mimic a pimple that will not heal.   A reddish or irritated area which may crust, itch, or cause discomfort. This may occur on areas exposed to the sun. These patches might be easier felt than seen.   A shiny, pearly, or  translucent bump that is pink, red, or white. The bump may also be tan, black, or brown, especially in dark haired people. These bumps can be confused with moles.   A pink growth with a slightly elevated, rolled border, and a crusted indentation in the center. As the growth slowly enlarges, tiny blood vessels may develop on the surface.   A scar-like white, yellow, or waxy area that looks like shiny, stretched skin. It often has irregular borders. This may be a sign of more aggressive basal cell carcinoma.  DIAGNOSIS  Your caregiver may be able to tell what is wrong by doing a physical exam. Often, a tissue sample (biopsy) is also taken. The tissue is examined under a microscope.  TREATMENT  The treatment for basal cell carcinoma depends on the type, size, location, and number of tumors. Possible treatments include:   Mohs surgery. This is a procedure done by a skin doctor (dermatologist or Mohs surgeon) in his or her office. The cancerous cells are removed layer by layer. This treatment has a high cure rate.   Surgical removal of the tumor.   Freezing the tumor with liquid nitrogen (cryosurgery).   Plastic surgery to remove the tumor, in the case of large tumors.   Radiation. This may be used for tumors on the face.   Photodynamic therapy. A chemical cream is applied to the skin  and light exposure is used to activate the chemical.   Chemical treatments, such as imiquimod cream and interferon injections. This may be used to remove superficial tumors with minimal scarring.   Electrodesiccation and curettage. This involves alternately scraping and burning the tumor, using an electric current to control bleeding.  Basal cell carcinoma can almost always be cured. It rarely spreads to other areas of the body (metastasizes). Basal cell carcinoma may come back at the same location (recur), but it can be treated again if this occurs. PREVENTION  Avoid the sun between 10:00 a.m. and 4:00 pm when  it is the strongest.   Use a sunscreen or sunblock with a sun protection factor of 30 or greater.   Apply sunscreen at least 30 minutes before exposure to the sun.   Reapply sunscreen every 2 to 4 hours while you are outside, after swimming, and after excessive sweating.   Always wear protective hats, clothing, and sunglasses with ultraviolet protection.   Avoid tanning beds.  HOME CARE INSTRUCTIONS   Avoid unprotected sun exposure.   Follow your caregiver's instructions for self-exams. Look for new spots or changes in your skin.   Keep all follow-up appointments as directed by your caregiver.  SEEK MEDICAL CARE IF:   You notice any new spots or changes in your skin.   You have had a basal cell carcinoma tumor removed and you notice a new growth in the same location.  Document Released: 07/19/2002 Document Revised: 01/01/2011 Document Reviewed: 10/06/2010 [facility] Patient Information 2012 [place], [place].

## 2011-04-07 ENCOUNTER — Telehealth: Payer: Self-pay | Admitting: *Deleted

## 2011-04-07 NOTE — Telephone Encounter (Signed)
Cathy at [facility] informed and states they will do the biopsy.

## 2011-04-07 NOTE — Telephone Encounter (Signed)
Derm office is needing results of the basal cell carcinoma of the face the pt was referred for and it is not in the chart. Please advise where this is at so we can fax a copy to derm.

## 2011-04-07 NOTE — Telephone Encounter (Signed)
It was a clinical diagnosis based on physical exam findings. It has not been biopsied.

## 2011-04-13 ENCOUNTER — Telehealth: Payer: Self-pay | Admitting: *Deleted

## 2011-04-13 MED ORDER — AZITHROMYCIN 250 MG PO TABS: ORAL_TABLET | ORAL | Status: AC

## 2011-04-13 NOTE — Telephone Encounter (Signed)
LM for pt to returncall

## 2011-04-13 NOTE — Telephone Encounter (Signed)
Pt informed

## 2011-04-13 NOTE — Telephone Encounter (Signed)
Sent Zpak to pharmacy for pick up.

## 2011-04-13 NOTE — Telephone Encounter (Signed)
Pt states that she is not feeling any better and would like to know if you will call her in an abx to the pharmacy. Please advise.

## 2011-04-23 ENCOUNTER — Encounter: Payer: Self-pay | Admitting: *Deleted

## 2011-04-27 ENCOUNTER — Ambulatory Visit
Admission: RE | Admit: 2011-04-27 | Discharge: 2011-04-27 | Disposition: A | Discharge status: Home / Self Care | Payer: 59 | Source: Ambulatory Visit | Attending: Obstetrics and Gynecology | Admitting: Obstetrics and Gynecology

## 2011-04-27 DIAGNOSIS — Z1231 Encounter for screening mammogram for malignant neoplasm of breast: Secondary | ICD-10-CM

## 2011-04-28 ENCOUNTER — Encounter: Payer: Self-pay | Admitting: Family Medicine

## 2011-04-28 ENCOUNTER — Ambulatory Visit (INDEPENDENT_AMBULATORY_CARE_PROVIDER_SITE_OTHER): Payer: 59 | Admitting: Family Medicine

## 2011-04-28 VITALS — BP 150/75 | HR 76 | Temp 98.2°F | Wt 173.0 lb

## 2011-04-28 DIAGNOSIS — J189 Pneumonia, unspecified organism: Secondary | ICD-10-CM

## 2011-04-28 NOTE — Progress Notes (Signed)
  Subjective:    Patient ID: Maria Nunez, female    DOB: 29-Dec-1960, 51 y.o.   MRN: [id number]  HPI F/U PNA - seen at [facility] in [place] on Monday and dx with lower left lobe pneumonia. Given Levaquin and cough syrup. Had been sick for 3 weeks. Seen on CXR.  Feels some better. Cough is more dry and has been better. Started again today. No SOB. No fever.  Dx on 04/20/11.  Has one more tab of levaquin.  Also given ventolin inhaler.  No hx of asthma.     Review of Systems     Objective:   Physical Exam  Constitutional: She is oriented to person, place, and time. She appears well-developed and well-nourished.  HENT:  Head: Normocephalic and atraumatic.  Cardiovascular: Normal rate, regular rhythm and normal heart sounds.   Pulmonary/Chest: Effort normal and breath sounds normal.  Neurological: She is alert and oriented to person, place, and time.  Skin: Skin is warm and dry.  Psychiatric: She has a normal mood and affect. Her behavior is normal.          Assessment & Plan:  PNA - likely bronchitis that developed into PNA.  ONe more tab of 10 days of Levaquin. She is at least 50% better. Asked her to monitor for new symptoms or recurrence of fever, shortness of breath or wheezing. If she feels she is declining at the she completes antibiotic complete: We'll repeat her chest x-ray. Otherwise in young healthy people without significant risk factors we do not normally repeat the chest x-ray. I did add the Hycodan syrup to her allergy list. Call if any further concerns.

## 2011-04-28 NOTE — Patient Instructions (Signed)

## 2011-06-23 ENCOUNTER — Encounter: Payer: Self-pay | Admitting: Family Medicine

## 2011-06-23 ENCOUNTER — Ambulatory Visit (INDEPENDENT_AMBULATORY_CARE_PROVIDER_SITE_OTHER): Payer: 59 | Admitting: Family Medicine

## 2011-06-23 VITALS — BP 152/78 | HR 93 | Ht 59.0 in | Wt 173.0 lb

## 2011-06-23 DIAGNOSIS — M7710 Lateral epicondylitis, unspecified elbow: Secondary | ICD-10-CM

## 2011-06-23 DIAGNOSIS — I10 Essential (primary) hypertension: Secondary | ICD-10-CM

## 2011-06-23 MED ORDER — LOSARTAN POTASSIUM-HCTZ 100-12.5 MG PO TABS: 1.0000 | ORAL_TABLET | Freq: Every day | ORAL | Status: DC

## 2011-06-23 NOTE — Progress Notes (Signed)
  Subjective:    Patient ID: Maria Nunez, female    DOB: 03/10/1960, 51 y.o.   MRN: [id number]  HPI Right arm pain about 2 months ago doing yardwork, lifting bags of mulch and planting seeds.  Gotten worse for last several weeks. Started jerking in her sleep. Tried tyelnol and heat - no relief.  Also uses it all day on the computer.  No old injuries.  Uses biofreeze on it in the morning. She says her hands occasionally go numb and asleep but she says she's had carpal tunnel syndrome for years. This is definitely different and new.  Hypertension-doing well. No chest pain or short of breath. Her blood pressure was elevated the last time she was here as well as today. She's not on medications but is due for refill.   Review of Systems     Objective:   Physical Exam  Constitutional: She is oriented to person, place, and time. She appears well-developed and well-nourished.  HENT:  Head: Normocephalic and atraumatic.  Cardiovascular: Normal rate, regular rhythm and normal heart sounds.   Pulmonary/Chest: Effort normal and breath sounds normal.  Musculoskeletal:       Right elbow with normal range of motion. She is tender over the lateral epicondyle. She has pain with pronation against resistance. Wrist with normal range of motion. Negative Tinel's and Phalen's sign.  Neurological: She is alert and oriented to person, place, and time.  Skin: Skin is warm and dry.  Psychiatric: She has a normal mood and affect. Her behavior is normal.          Assessment & Plan:  Right lateral epicondylitis. Given H.O. discussed starting Aleve twice a day for 4-5 days. Make sure take with food and water to avoid any GI irritation or ulcers. Also recommend getting a tennis elbow strap for support. It is more uncomfortable to wear it and discontinued using it. If not significantly better in the next 3-4 weeks and please give [place] a call. We could consider a steroid injection for more acute pain relief if  not improving. We could also consider more physical therapy-type options. Start home exercises as tolerated. Start with the stretching exercises first and then moved to strengthening after couple weeks if doing well.  HTN -uncontrolled.  needs refill on BP meds. Will increase her losartan and rechekc BP in one month.  BP Readings from Last 3 Encounters:  06/23/11 152/78  04/28/11 150/75  04/06/11 132/77

## 2011-07-24 ENCOUNTER — Encounter: Payer: Self-pay | Admitting: *Deleted

## 2011-07-31 ENCOUNTER — Encounter: Payer: Self-pay | Admitting: Family Medicine

## 2011-07-31 ENCOUNTER — Ambulatory Visit (INDEPENDENT_AMBULATORY_CARE_PROVIDER_SITE_OTHER): Payer: 59 | Admitting: Family Medicine

## 2011-07-31 VITALS — BP 130/65 | HR 95 | Ht 59.0 in | Wt 173.0 lb

## 2011-07-31 DIAGNOSIS — I10 Essential (primary) hypertension: Secondary | ICD-10-CM

## 2011-07-31 DIAGNOSIS — L719 Rosacea, unspecified: Secondary | ICD-10-CM

## 2011-07-31 MED ORDER — METRONIDAZOLE 1 % EX CREA: TOPICAL_CREAM | Freq: Every day | CUTANEOUS | Status: DC

## 2011-07-31 NOTE — Progress Notes (Signed)
  Subjective:    Patient ID: Maria Nunez, female    DOB: 04-03-1960, 51 y.o.   MRN: [id number]  HPI HTN - no chest pain or shortness of breath. She says that when she started taking losartan HCTZ she was driving home one evening and that her cheeks are burning to the point that she had put a cool cloth on it. She does have a history of rosacea and would like a refill on her cream. She wasn't sure if this is a side effect of the medication or her rosacea. She did notice a bump up up on the right side of her cheek as well. In fact she went back to taking her old lisinopril HCTZ for a short period of time but switched back a few days ago to losartan HCTZ.   Review of Systems     Objective:   Physical Exam  Constitutional: She is oriented to person, place, and time. She appears well-developed and well-nourished.  HENT:  Head: Normocephalic and atraumatic.       Cheeks do look pink.   Cardiovascular: Normal rate, regular rhythm and normal heart sounds.   Pulmonary/Chest: Effort normal and breath sounds normal.  Neurological: She is alert and oriented to person, place, and time.  Skin: Skin is warm and dry.  Psychiatric: She has a normal mood and affect. Her behavior is normal.          Assessment & Plan:  HTN- I offered to change the medication but she decided she would like to stay on it for a few more weeks to get a little bit longer to work. Her blood pressure looks well controlled today. Check BMP. She has noted some occasional foot cramps at night since been on the medication but she does not want to stop the diuretic. We'll check her electrolytes to rule out hypokalemia. I also reminded her that the medication can cause sun sensitivity. She's make sure she's wearing sunscreen and avoid prolonged exposure as she will turn more easily.  Rosacea-I refilled her metronidazole 1% cream.

## 2011-08-01 LAB — BASIC METABOLIC PANEL WITH GFR
BUN: 13 mg/dL (ref 6–23)
CO2: 24 mEq/L (ref 19–32)
Calcium: 9.4 mg/dL (ref 8.4–10.5)
Chloride: 103 mEq/L (ref 96–112)
Creat: 0.59 mg/dL (ref 0.50–1.10)
GFR, Est African American: >89 mL/min
GFR, Est Non African American: >89 mL/min
Glucose, Bld: 105 mg/dL — ABNORMAL HIGH (ref 70–99)
Potassium: 3.6 mEq/L (ref 3.5–5.3)
Sodium: 140 mEq/L (ref 135–145)

## 2011-08-03 NOTE — Progress Notes (Signed)
Quick Note:  All labs are normal. ______ 

## 2011-08-07 ENCOUNTER — Other Ambulatory Visit: Payer: Self-pay | Admitting: Obstetrics and Gynecology

## 2011-10-20 ENCOUNTER — Other Ambulatory Visit: Payer: Self-pay | Admitting: Family Medicine

## 2011-11-06 ENCOUNTER — Encounter: Payer: Self-pay | Admitting: Family Medicine

## 2011-11-06 ENCOUNTER — Ambulatory Visit (INDEPENDENT_AMBULATORY_CARE_PROVIDER_SITE_OTHER): Payer: 59 | Admitting: Family Medicine

## 2011-11-06 VITALS — BP 148/79 | HR 92 | Temp 98.2°F | Ht 59.0 in | Wt 173.0 lb

## 2011-11-06 DIAGNOSIS — J329 Chronic sinusitis, unspecified: Secondary | ICD-10-CM

## 2011-11-06 DIAGNOSIS — IMO0001 Reserved for inherently not codable concepts without codable children: Secondary | ICD-10-CM

## 2011-11-06 DIAGNOSIS — R030 Elevated blood-pressure reading, without diagnosis of hypertension: Secondary | ICD-10-CM

## 2011-11-06 MED ORDER — AZITHROMYCIN 250 MG PO TABS: ORAL_TABLET | ORAL | Status: DC

## 2011-11-06 NOTE — Progress Notes (Signed)
  Subjective:    Patient ID: Maria Nunez, female    DOB: [birth year], 50 y.o.   MRN: [id number]  HPI 8 days of sinus congestion and cough. Started with ST.  Ear pain.  No fever.  Bilateral pressure over the forehead. Watery eyes, HA.  Taking OTC Dayquail.  No GI sxs.  + sick contacts.    Review of Systems     Objective:   Physical Exam  Constitutional: She is oriented to person, place, and time. She appears well-developed and well-nourished.  HENT:  Head: Normocephalic and atraumatic.  Right Ear: External ear normal.  Left Ear: External ear normal.  Nose: Nose normal.  Mouth/Throat: Oropharynx is clear and moist.       TMs and canals are clear.   Eyes: Conjunctivae normal and EOM are normal. Pupils are equal, round, and reactive to light.  Neck: Neck supple. No thyromegaly present.  Cardiovascular: Normal rate, regular rhythm and normal heart sounds.   Pulmonary/Chest: Effort normal and breath sounds normal. She has no wheezes.  Lymphadenopathy:    She has no cervical adenopathy.  Neurological: She is alert and oriented to person, place, and time.  Skin: Skin is warm and dry.  Psychiatric: She has a normal mood and affect.          Assessment & Plan:  Sinusitis- will tx with zpack. Call if not better in one week. Continue symptomatic care.   Will get flu shot at work  [place] colonoscopy for now.   HTN - recheck at work in 1-2 weeks when feeling better.

## 2011-11-06 NOTE — Patient Instructions (Addendum)

## 2011-11-09 ENCOUNTER — Ambulatory Visit (INDEPENDENT_AMBULATORY_CARE_PROVIDER_SITE_OTHER): Payer: Self-pay | Admitting: Family Medicine

## 2011-11-09 DIAGNOSIS — R7309 Other abnormal glucose: Secondary | ICD-10-CM

## 2011-11-09 DIAGNOSIS — R7303 Prediabetes: Secondary | ICD-10-CM

## 2011-11-09 NOTE — Progress Notes (Signed)
Patient presents today for 3 month Pre-Diabetes follow-up as part of the employer-sponsored Link to Wellness program. Medications and glucose readings have been reviewed. I have also discussed with patient lifestyle interventions such as healthy eating choices and physical activity. Details of this visit can be found in [employer] documenting program available through [employer] [facility]). Patient has set a series of personal goals and will follow-up in 3 months for further review of DM.

## 2011-11-19 ENCOUNTER — Encounter: Payer: Self-pay | Admitting: Family Medicine

## 2011-11-19 NOTE — Progress Notes (Signed)
Patient ID: Maria Nunez, female   DOB: 1960-05-11, 51 y.o.   MRN: [id number] Reviewed and agree with this documentation and management.

## 2011-11-26 ENCOUNTER — Encounter: Payer: Self-pay | Admitting: Sports Medicine

## 2011-11-26 ENCOUNTER — Ambulatory Visit (INDEPENDENT_AMBULATORY_CARE_PROVIDER_SITE_OTHER): Payer: 59 | Admitting: Sports Medicine

## 2011-11-26 ENCOUNTER — Encounter: Payer: Self-pay | Admitting: *Deleted

## 2011-11-26 VITALS — BP 135/75 | HR 84 | Wt 174.0 lb

## 2011-11-26 DIAGNOSIS — M722 Plantar fascial fibromatosis: Secondary | ICD-10-CM

## 2011-11-26 NOTE — Progress Notes (Signed)
Subjective:    I'm seeing this patient as a consultation for:  Dr. Linford Arnold  CC: Left foot pain  HPI: Maria Nunez is a 3 month history of pain that she localizes to the plantar aspect of the left foot at the calcaneal insertion of the plantar fascia. She notices this pain is particularly bad in the first few steps of the morning. She's tried some over-the-counter heel inserts, and icing, but as noted no improvement. She's never had an injection. The pain is localized, does not radiate, it is moderate in severity.  Past medical history, Surgical history, Family history, Social history, Allergies, and medications have been entered into the medical record, reviewed, and no changes needed.   Review of Systems: No headache, visual changes, nausea, vomiting, diarrhea, constipation, dizziness, abdominal pain, skin rash, fevers, chills, night sweats, weight loss, swollen lymph nodes, body aches, joint swelling, muscle aches, chest pain, or shortness of breath.   Objective:   Vitals:  Afebrile, vital signs stable. General: Well Developed, well nourished, and in no acute distress.  Neuro/Psych: Alert and oriented x3, extra-ocular muscles intact, able to move all 4 extremities.  Skin: Warm and dry, no rashes noted.  Respiratory: Not using accessory muscles, speaking in full sentences, trachea midline.  Cardiovascular: Pulses palpable, no extremity edema. Abdomen: Does not appear distended. Bilateral pes cavus, flexible, discrete tender to palpation at the calcaneal insertion of the plantar fascia.  Leg lengths are equal.  Procedure: Real-time Ultrasound Guided Injection of left plantar fascia Device: GE Logiq E  Ultrasound guided injection is preferred based studies that show increased duration, increased effect, greater accuracy, decreased procedural pain, increased response rate, and decreased cost with ultrasound guided versus blind injection.  Verbal informed consent obtained.  Time-out conducted.    Noted no overlying erythema, induration, or other signs of local infection.  Skin prepped in a sterile fashion.  Local anesthesia: Topical Ethyl chloride.  With sterile technique and under real time ultrasound guidance:  1 cc Kenalog 40, 3 cc lidocaine guided just under the plantar fascia at its insertion on the calcaneus.  Plantar fascia thickness was 0.75 cm. This is thickened. Completed without difficulty  Pain immediately resolved suggesting accurate placement of the medication.  Advised to call if fevers/chills, erythema, induration, drainage, or persistent bleeding.  Images permanently stored and available for review in the ultrasound unit.  Impression: Technically successful ultrasound guided injection.  Impression and Recommendations:   This case required medical decision making of moderate complexity.

## 2011-11-26 NOTE — Assessment & Plan Note (Signed)
Ultrasound guided injection as above. Home rehabilitation exercises to be aggressively performed on a daily basis. I would like to see her back for custom orthotics to better support her bilateral pes cavus. I would also like to get her into an air heel brace at some point. She will come back to see me for custom orthotics.  We may do her contralateral side at some point.

## 2011-12-09 ENCOUNTER — Encounter: Payer: Self-pay | Admitting: Sports Medicine

## 2011-12-09 ENCOUNTER — Ambulatory Visit (INDEPENDENT_AMBULATORY_CARE_PROVIDER_SITE_OTHER): Payer: 59 | Admitting: Sports Medicine

## 2011-12-09 VITALS — BP 139/72 | HR 81 | Wt 171.0 lb

## 2011-12-09 DIAGNOSIS — M722 Plantar fascial fibromatosis: Secondary | ICD-10-CM

## 2011-12-09 DIAGNOSIS — R269 Unspecified abnormalities of gait and mobility: Secondary | ICD-10-CM

## 2011-12-09 NOTE — Progress Notes (Signed)
Patient was fitted for a : standard, cushioned, semi-rigid orthotic. The orthotic was heated and afterward the patient stood on the orthotic blank positioned on the orthotic stand. The patient was positioned in subtalar neutral position and 10 degrees of ankle dorsiflexion in a weight bearing stance. After completion of molding, a stable base was applied to the orthotic blank. The blank was ground to a stable position for weight bearing. Size: 6 Base: Blue EVA Additional Posting and Padding: None The patient ambulated these, and they were very comfortable.  She did have a little pain in the heel, she was wearing very tight shoe.

## 2011-12-09 NOTE — Assessment & Plan Note (Signed)
Custom orthotics as above. Symptoms nearly completely resolved after injection. She needs to increase her compliance with rehabilitation exercises. Return to clinic in 4 weeks as needed.

## 2011-12-17 ENCOUNTER — Other Ambulatory Visit: Payer: Self-pay | Admitting: Family Medicine

## 2012-01-28 ENCOUNTER — Other Ambulatory Visit: Payer: Self-pay | Admitting: Family Medicine

## 2012-02-17 ENCOUNTER — Other Ambulatory Visit: Payer: Self-pay | Admitting: Obstetrics and Gynecology

## 2012-02-17 DIAGNOSIS — Z1231 Encounter for screening mammogram for malignant neoplasm of breast: Secondary | ICD-10-CM

## 2012-03-14 ENCOUNTER — Ambulatory Visit (INDEPENDENT_AMBULATORY_CARE_PROVIDER_SITE_OTHER): Payer: Self-pay | Admitting: Family Medicine

## 2012-03-14 VITALS — BP 129/72 | HR 74 | Ht 59.0 in | Wt 176.8 lb

## 2012-03-14 DIAGNOSIS — E119 Type 2 diabetes mellitus without complications: Secondary | ICD-10-CM

## 2012-03-14 LAB — HEMOGLOBIN A1C: Hgb A1c MFr Bld: 6.7 % — AB (ref 4.0–6.0)

## 2012-03-14 NOTE — Progress Notes (Signed)
Patient presents today for 3 month DM follow-up as part of the employer-sponsored Link to Wellness program. Medications and glucose readings have been reviewed. I have also discussed with patient lifestyle interventions such as healthy eating choices and physical activity. Details of this visit can be found in [employer] documenting program available through [facility] Patient has set a series of personal goals and will follow-up in 3 months for further review of DM.  

## 2012-03-17 ENCOUNTER — Encounter: Payer: Self-pay | Admitting: *Deleted

## 2012-03-22 NOTE — Progress Notes (Signed)
ATTENDING PHYSICIAN NOTE: I have reviewed the chart and agree with the plan as detailed above. Monta Maiorana MD Pager [phone]  

## 2012-04-18 ENCOUNTER — Encounter: Payer: Self-pay | Admitting: Family Medicine

## 2012-04-18 ENCOUNTER — Ambulatory Visit (INDEPENDENT_AMBULATORY_CARE_PROVIDER_SITE_OTHER): Payer: 59 | Admitting: Family Medicine

## 2012-04-18 ENCOUNTER — Ambulatory Visit (INDEPENDENT_AMBULATORY_CARE_PROVIDER_SITE_OTHER): Payer: 59 | Admitting: Sports Medicine

## 2012-04-18 VITALS — BP 145/75 | HR 80 | Ht 59.0 in | Wt 176.0 lb

## 2012-04-18 VITALS — BP 145/75 | HR 80 | Wt 176.0 lb

## 2012-04-18 DIAGNOSIS — E119 Type 2 diabetes mellitus without complications: Secondary | ICD-10-CM

## 2012-04-18 DIAGNOSIS — M722 Plantar fascial fibromatosis: Secondary | ICD-10-CM

## 2012-04-18 DIAGNOSIS — Z23 Encounter for immunization: Secondary | ICD-10-CM

## 2012-04-18 MED ORDER — PNEUMOCOCCAL VAC POLYVALENT 25 MCG/0.5ML IJ INJ: 0.5000 mL | INJECTION | Freq: Once | INTRAMUSCULAR | Status: DC

## 2012-04-18 MED ORDER — METFORMIN HCL ER 500 MG PO TB24: 500.0000 mg | ORAL_TABLET | Freq: Every day | ORAL | Status: DC

## 2012-04-18 MED ORDER — PNEUMOCOCCAL VAC POLYVALENT 25 MCG/0.5ML IJ INJ: 0.5000 mL | INJECTION | INTRAMUSCULAR | Status: DC

## 2012-04-18 NOTE — Progress Notes (Signed)
  Subjective:    CC: Followup  HPI: Left plantar fasciitis: Injected 2 months ago, she did have a good amount of benefit, currently has no pain, but at the end of the day it does go back up to an 8/10. Ago for custom orthotics, unfortunately she has not been doing the exercises and not been wearing the orthotics. She is overall better than before.  Past medical history, Surgical history, Family history not pertinant except as noted below, Social history, Allergies, and medications have been entered into the medical record, reviewed, and no changes needed.   Review of Systems: No headache, visual changes, nausea, vomiting, diarrhea, constipation, dizziness, abdominal pain, skin rash, fevers, chills, night sweats, weight loss, swollen lymph nodes, body aches, joint swelling, muscle aches, chest pain, shortness of breath, mood changes, visual or auditory hallucinations.   Objective:   General: Well Developed, well nourished, and in no acute distress.  Neuro/Psych: Alert and oriented x3, extra-ocular muscles intact, able to move all 4 extremities, sensation grossly intact. Skin: Warm and dry, no rashes noted.  Respiratory: Not using accessory muscles, speaking in full sentences, trachea midline.  Cardiovascular: Pulses palpable, no extremity edema. Abdomen: Does not appear distended. Left Ankle: No visible erythema or swelling. Range of motion is full in all directions. Strength is 5/5 in all directions. Stable lateral and medial ligaments; squeeze test and kleiger test unremarkable; Talar dome nontender; No pain at base of 5th MT; No tenderness over cuboid; No tenderness over N spot or navicular prominence No tenderness on posterior aspects of lateral and medial malleolus No sign of peroneal tendon subluxations or tenderness to palpation Negative tarsal tunnel tinel's Able to walk 4 steps. Pes cavus bilaterally, only minimal tenderness to palpation of the calcaneal insertion of the  plantar fascia.  Impression and Recommendations:   This case required medical decision making of moderate complexity.

## 2012-04-18 NOTE — Addendum Note (Signed)
Addended by: Deno Etienne on: 04/18/2012 11:08 AM   Modules accepted: Orders, SmartSet

## 2012-04-18 NOTE — Progress Notes (Signed)
  Subjective:    Patient ID: Maria Nunez, female    DOB: 1960/12/24, 52 y.o.   MRN: [id number]  HPI New dx DM.  Here to f/u.  They are talking her through a program at work. Her last hemoglobin A1c was 6.7. She has been right on the border for the last 2 years. Previously she was walking but now has plantar fasciitis and has had difficulty walking because of pain. She did use a nutrition and dietary counseling initially through the program. She does feel comfortable with this information. No increased thirst or urination. No cuts or wounds that are healing well.   Review of Systems     Objective:   Physical Exam  Constitutional: She is oriented to person, place, and time. She appears well-developed and well-nourished.  HENT:  Head: Normocephalic and atraumatic.  Cardiovascular: Normal rate, regular rhythm and normal heart sounds.   Pulmonary/Chest: Effort normal and breath sounds normal.  Musculoskeletal: She exhibits no edema.  Neurological: She is alert and oriented to person, place, and time.  Skin: Skin is warm and dry.  Psychiatric: She has a normal mood and affect. Her behavior is normal.          Assessment & Plan:  Diabetes-will start metformin. She would prefer to start the extended release product. Warned about potential side effects of the medication. Call if any problems or other concerns. Otherwise followup in 3 months we'll recheck her A1c at that point time. Hopefully she can get her plantar fasciitis is better and she can continue to work on diet, exercise and weight loss. Her eye exam is up-to-date. We will do a urine microalbumin when I see her back. She is okay with getting her pneumonia vaccine updated today. She did have her flu shot back in the fall. She is Re: on aspirin, ARB, statin.

## 2012-04-18 NOTE — Assessment & Plan Note (Addendum)
Greatly improved status post injection but not doing exercises and not wearing socks. She will get more compliant with exercises, and she will wear her athletic shoes with the orthotics daily at work. I will see her back in 4-6 weeks if no better we can certainly consider repeat injection. I am going to give her and airheel brace.

## 2012-04-27 ENCOUNTER — Ambulatory Visit: Payer: 59

## 2012-05-17 ENCOUNTER — Other Ambulatory Visit: Payer: Self-pay | Admitting: Family Medicine

## 2012-05-20 ENCOUNTER — Ambulatory Visit
Admission: RE | Admit: 2012-05-20 | Discharge: 2012-05-20 | Disposition: A | Discharge status: Home / Self Care | Payer: 59 | Source: Ambulatory Visit | Attending: Obstetrics and Gynecology | Admitting: Obstetrics and Gynecology

## 2012-05-20 DIAGNOSIS — Z1231 Encounter for screening mammogram for malignant neoplasm of breast: Secondary | ICD-10-CM

## 2012-06-09 ENCOUNTER — Encounter: Payer: Self-pay | Admitting: Obstetrics and Gynecology

## 2012-06-13 ENCOUNTER — Ambulatory Visit (INDEPENDENT_AMBULATORY_CARE_PROVIDER_SITE_OTHER): Payer: Self-pay | Admitting: Family Medicine

## 2012-06-13 VITALS — BP 138/75 | HR 82 | Ht 59.0 in | Wt 177.0 lb

## 2012-06-13 DIAGNOSIS — E119 Type 2 diabetes mellitus without complications: Secondary | ICD-10-CM

## 2012-06-13 NOTE — Progress Notes (Signed)
Established Patient - Follow-Up Evaluation  [facility] Consult - Clinical Pharmacist  Care Team Member: Gerre Pebbles    Subjective:  Patient presents today for 3 month diabetes follow-up as part of the employer-sponsored Link to Wellness program. Current diabetes regimen includes metformin 500 mg ER once daily. Patient also continues on daily ASA, ARB, and statin.   Since last visit metformin 500 mg ER daily was added. She states that she is tolerating the medication well. No other health changes since last visit.    Disease Assessments:  Diabetes:  Sees Diabetes provider 2 times per year; Type of Diabetes: Type 2; checks feet daily; MD managing Diabetes [place]; checks blood glucose 2 times a day; uses glucometer; takes medications as prescribed; takes an aspirin a day; hypoglycemia frequency never; Other Diabetes History: Last saw Dr. Eppie Gibson in March.   States she is checking BID. She is checking in the morning and then again before bedtime. She did not bring her meter with her to this appointment.   Morning numbers (per patient report) 98-103 Night numbers- 115-120      Social History:  Alcohol use: Non-Drinker; Caffeine use: 1 servings per day; Exercise habits: 60 minutes; Exercise adherence 5 days or more days a week; Medication adherence adherent; 180 minutes of exercise per week; Patient can afford medications; Patient knows the purpose/use of medications; Diet adherence 50-75% of the time.   Physical Activity- She went to the power sculpt class last week and she really enjoyed it. She said that she is going to a class next week. She is gardening now and also walking the dogs. She isn't sitting much at home. She reports walking about 45 minutes each day with the dogs.   Nutrition-  She reports that snacking has dramatically decreased.   B- bowl of cheerios  mid morning- yogurt or special K  L- chicken or egg salad sandwich, sometimes chips, water or diet soda   afternoon-  fruit cup  D- hit or miss- she doesn't cook much. Last night tomato and cucumber; london broil   Snacking on sweets has dropped- only once or twice.   She has dropped her consumption of regular soda quite a bit.   Vital Signs:  06/13/2012 4:13 PM (EST) Blood Pressure 138 / 75 mm/HgBMI 35.7; Height 4 ft 11 in; Pulse Rate 82 bpm; Weight 177 lbs    Testing:  Blood Sugar Tests:  Hemoglobin A1c: 6.4    Assessment/Plan:  Patient is a 52 year old female with DM2. Current glycemic control is improved from last visit (A1C down to 6.4% from 6.7% in May) and is meeting A1C goal of less than 7%. She had an appointment with her PCP in March and at that time she was started on metformin ER 500 mg once daily. She reports no adverse events with metformin. She has also cut back on snacking, especially on sweets, and she has increased physical acitvity to walking 45 minutes daily with her dogs. Her weight remains stable at 177 pounds, but she notes that her clothes are fitting differently and at home on her scale she is losing weight.   Congratulated patient on her success so far and encouraged her to continue making healthy eating choices and to increase physical activity. Follow up with patient in 3 months.   I will fax Dr. Linford Arnold a copy of her A1C result.   Goals for Next Visit-  1. 5 pound weight loss. Ways to get there- increase physical activity. Aim to  use the treadmill four times a week for 30 minutes.  2. Continue making healthy eating choices.  3. Wear comfortable shoes for next visit- we will go walking :-)   Next visit with me is Monday September 15th at 4 PM.   Aundra Millet D. Vivia Ewing, PharmD, BCPS

## 2012-06-16 ENCOUNTER — Ambulatory Visit (INDEPENDENT_AMBULATORY_CARE_PROVIDER_SITE_OTHER): Payer: 59 | Admitting: Obstetrics and Gynecology

## 2012-06-16 ENCOUNTER — Encounter: Payer: Self-pay | Admitting: Obstetrics and Gynecology

## 2012-06-16 VITALS — BP 124/70 | Ht 59.5 in | Wt 178.0 lb

## 2012-06-16 DIAGNOSIS — Z1211 Encounter for screening for malignant neoplasm of colon: Secondary | ICD-10-CM

## 2012-06-16 DIAGNOSIS — Z01419 Encounter for gynecological examination (general) (routine) without abnormal findings: Secondary | ICD-10-CM

## 2012-06-16 DIAGNOSIS — R6889 Other general symptoms and signs: Secondary | ICD-10-CM

## 2012-06-16 DIAGNOSIS — IMO0002 Reserved for concepts with insufficient information to code with codable children: Secondary | ICD-10-CM

## 2012-06-16 NOTE — Progress Notes (Signed)
Patient ID: Maria Nunez, female   DOB: 1960/09/01, 52 y.o.   MRN: [id number] 52 y.o.   Divorced    Caucasian   female   Status post supracervical hysterectomy 2002 for fibroids. Still has ovaries. [id number]   here for annual exam.   Saw Dr. Tenny Craw in February of 2014 for bleeding.    Has had abnormal paps since hysterectomy.  Now on metrformin for diabetes.  HgbA1C 6.4.  Sees PCP at the end of June. No LMP recorded. Patient has had a hysterectomy.          Sexually active: no  The current method of family planning is status post hysterectomy.    Exercising: no Last mammogram: 02/2012 wnl: [facility]  [place] occasional self breast exams. Last pap smear: 07/2011: LGSIL.  Last colposcopy with Dr.  Tenny Craw 2012 - LGSIL and cervicitis. History of abnormal pap: Yes: history of colposcopy and positive HPV Smoking: no Alcohol: no Last colonoscopy: never Last Bone Density: never  Last tetanus shot: greater than 10 years ago but is uncertain.  States had a reaction to Tetanus with high fever. Last cholesterol check: 1 year ago:wnl  Hgb:  PCP             Urine: PCP   Family History  Problem Relation Age of Onset  . Asthma Mother   . COPD Mother   . Diabetes Mother   . Hypertension Mother   . Diabetes Father   . Hypertension Father   . Breast cancer Maternal Aunt   . Breast cancer Paternal Aunt     Patient Active Problem List   Diagnosis Date Noted  . Plantar fasciosis 11/26/2011  . Type II or unspecified type diabetes mellitus without mention of complication, not stated as uncontrolled 07/02/2009  . PANIC ATTACK 09/07/2008  . HYPERTENSION 04/27/2008  . RESTLESS LEG SYNDROME 04/02/2008  . POLYARTHRITIS 04/02/2008  . KNEE PAIN, LEFT 01/02/2008  . HOT FLASHES 08/04/2006  . HYPERCHOLESTEROLEMIA 11/03/2005  . HYPERTENSION, BENIGN SYSTEMIC 11/03/2005    Past Medical History  Diagnosis Date  . Hypertension   . Allergy   . Hidradenitis   . Diabetes mellitus     borderline   . Fibroid   . Heart murmur     Past Surgical History  Procedure Laterality Date  . Cesarean section    . Inguinal hidradenitis excision    . Breast surgery      cysts removed right breast  . Abdominal hysterectomy      supracervical    Allergies: Cefaclor; Hycodan; Penicillins; Sulfonamide derivatives; and Tetracycline  Current Outpatient Prescriptions  Medication Sig Dispense Refill  . aspirin 81 MG tablet Take 81 mg by mouth daily.        [name] FOLIC ACID PO Take 400 mcg by mouth daily.       Demetra Shiner Device MISC        . losartan-hydrochlorothiazide (HYZAAR) 100-12.5 MG per tablet TAKE 1 TABLET BY MOUTH DAILY  90 tablet  PRN  . metFORMIN (GLUCOPHAGE XR) 500 MG 24 hr tablet Take 1 tablet (500 mg total) by mouth daily with breakfast.  30 tablet  3  . metroNIDAZOLE (METROCREAM) 0.75 % cream Apply 1 application topically daily.      . Multiple Vitamin (MULTIVITAMIN) tablet Take 1 tablet by mouth daily.        . Omega-3 Fatty Acids (FISH OIL) 1000 MG CAPS Take 2,000 mg by mouth daily. 4 tabs      .  pravastatin (PRAVACHOL) 40 MG tablet TAKE 1 TABLET BY MOUTH EVERY EVENING.  90 tablet  1  . TRUETRACK TEST test strip TEST BLOOD SUGAR TWICE DAILY  200 each  2  . vitamin B-12 (CYANOCOBALAMIN) 100 MCG tablet Take 50 mcg by mouth daily.        . Zinc Sulfate (ZINC 15 PO) Take by mouth.         No current facility-administered medications for this visit.    ROS: Pertinent items are noted in HPI.  Social Hx:  Works for [employer].  Exam:    BP 124/70  Ht 4' 11.5" (1.511 m)  Wt 178 lb (80.74 kg)  BMI 35.36 kg/m2   Wt Readings from Last 3 Encounters:  06/16/12 178 lb (80.74 kg)  06/13/12 177 lb (80.287 kg)  04/18/12 176 lb (79.833 kg)     Ht Readings from Last 3 Encounters:  06/16/12 4' 11.5" (1.511 m)  06/13/12 4\' 11"  (1.499 m)  04/18/12 4\' 11"  (1.499 m)    General appearance: alert, cooperative and appears stated age Head: Normocephalic, without obvious abnormality,  atraumatic Neck: no adenopathy, supple, symmetrical, trachea midline and thyroid not enlarged, symmetric, no tenderness/mass/nodules Lungs: clear to auscultation bilaterally Breasts: Inspection negative, No nipple retraction or dimpling, No nipple discharge or bleeding, No axillary or supraclavicular adenopathy, Normal to palpation without dominant masses Heart: regular rate and rhythm Abdomen: soft, obese, non-tender; ; no masses,  no organomegaly Extremities: extremities normal, atraumatic, no cyanosis or edema Skin: Skin color, texture, turgor normal. No rashes or lesions Lymph nodes: Cervical, supraclavicular, and axillary nodes normal. No abnormal inguinal nodes palpated Neurologic: Grossly normal   Pelvic: External genitalia:  no lesions              Urethra:  normal appearing urethra with no masses, tenderness or lesions              Bartholins and Skenes: normal                 Vagina: normal appearing vagina with normal color and discharge, no lesions              Cervix: normal appearance              Pap taken: yes and high risk HPV.        Bimanual Exam:  Uterus:   Absent.                                      Adnexa: normal adnexa in size, nontender and no masses.  Exam limited by body habitus.                                      Rectovaginal: Confirms                                      Anus:  normal sphincter tone, no lesions  A: normal menopausal exam History of high risk HPV Reaction of prior tetanus vaccine     P: mammogram yearly pap smear and high risk HPV testing I discussed possible cervical removal for persistent dysplasia due to the long standing nature of the dysplasia.  Will not pursue unless has a change in  the level of dysplasia. Patient will discuss TDap with PCP after she checks her vaccine records. return annually or prn     An After Visit Summary was printed and given to the patient.

## 2012-06-16 NOTE — Patient Instructions (Signed)

## 2012-06-21 LAB — IPS PAP TEST WITH HPV

## 2012-06-23 ENCOUNTER — Telehealth: Payer: Self-pay | Admitting: *Deleted

## 2012-06-23 ENCOUNTER — Other Ambulatory Visit: Payer: Self-pay | Admitting: Obstetrics and Gynecology

## 2012-06-23 DIAGNOSIS — IMO0001 Reserved for inherently not codable concepts without codable children: Secondary | ICD-10-CM

## 2012-06-23 DIAGNOSIS — B977 Papillomavirus as the cause of diseases classified elsewhere: Secondary | ICD-10-CM

## 2012-06-23 NOTE — Telephone Encounter (Signed)
Patient confirms she has reviewed results in MyChart and denies questions.  Colpo appt scheduled for 07-14-12. Patient will take motrin 1 hr prior with food.

## 2012-06-23 NOTE — Progress Notes (Signed)
Patient ID: Santa Genera, female   DOB: [birth year], 52 y.o.   MRN: [id number] ATTENDING PHYSICIAN NOTE: I have reviewed the chart and agree with the plan as detailed above. Denny Levy MD Pager [phone]

## 2012-06-23 NOTE — Telephone Encounter (Signed)
Message copied by Alisa Graff on Thu Jun 23, 2012  6:20 PM ------      Message from: Conley Simmonds      Created: Thu Jun 23, 2012 10:18 AM       Kennon Rounds,            Will you contact the patient to schedule a colposcopy with me please?  I also released results to her in My Chart. She has a long history of abnormal paps and has a a previous supracervical hysterectomy.            Conley Simmonds ------

## 2012-06-27 ENCOUNTER — Telehealth: Payer: Self-pay | Admitting: Orthopedic Surgery

## 2012-06-27 NOTE — Telephone Encounter (Signed)
LMTCB  aa    (Need to tell her about appt with Dr. Loreta Ave 06-30-12 at 10:30 for consult about colonoscopy. [phone] to reschedule.)

## 2012-07-01 NOTE — Telephone Encounter (Signed)
LMTCB to discuss insurance benefits for Colpo scheduled 6/19 with Edward Jolly.

## 2012-07-05 LAB — HEMOGLOBIN A1C: Blood Glucose, Fingerstick: 6.4

## 2012-07-12 ENCOUNTER — Encounter: Payer: Self-pay | Admitting: Obstetrics and Gynecology

## 2012-07-13 NOTE — Telephone Encounter (Signed)
6/18 Spoke with [employer] to recheck her benefits. This procedure will go towards her deductible. Called the patient and discussed this with her. She agreed to pay $361.59 at check in.

## 2012-07-14 ENCOUNTER — Encounter: Payer: Self-pay | Admitting: Obstetrics and Gynecology

## 2012-07-14 ENCOUNTER — Ambulatory Visit (INDEPENDENT_AMBULATORY_CARE_PROVIDER_SITE_OTHER): Payer: 59 | Admitting: Obstetrics and Gynecology

## 2012-07-14 DIAGNOSIS — R6889 Other general symptoms and signs: Secondary | ICD-10-CM

## 2012-07-14 DIAGNOSIS — B977 Papillomavirus as the cause of diseases classified elsewhere: Secondary | ICD-10-CM

## 2012-07-14 DIAGNOSIS — IMO0001 Reserved for inherently not codable concepts without codable children: Secondary | ICD-10-CM

## 2012-07-14 NOTE — Progress Notes (Signed)
Subjective:     Patient ID: Maria Nunez, female   DOB: [birth year], 52 y.o.   MRN: [id number]  HPI  Pap showed ASCUS, positive high risk HPV. Long history of abnormal paps. Last prior colposcopy 2012 - LGSIL. Status post supracervical hysterectomy for fibroids.  Review of Systems    See HPI Objective:   Physical Exam  Genitourinary:        Colposcopy  Consent performed.  Acetic acid placed in vagina.  Colposcopy unsatisfactory.  No lesions seen.  No squamocolumnar junction identified.  No complications. Assessment:     Status post supracervical hysterectomy. Long standing abnormal paps. High risk HPV. Unsatisfactory colposcopy.    Plan:     I discussed vaginal estrogen course and cytotec to repeat colposcopy vs. LEEP vs. Trachelectomy. I have reviewed risks and benefits of each. Patient wishes to proceed with LEEP procedure in office.

## 2012-07-14 NOTE — Patient Instructions (Signed)
Loop Electrosurgical Excision Procedure  Loop electrosurgical excision procedure (LEEP) is the removal of a portion of the lower part of the uterus (cervix). The procedure is done when there are significantly abnormal cervical cell changes. Abnormal cell changes of the cervix can lead to cancer if left in place and untreated.     The LEEP procedure itself typically only takes a few minutes. Often, it may be done in your caregiver's office. The procedure is considered safe for those who wish to get pregnant or are trying to get pregnant. Only under rare circumstances should this procedure be done if you are pregnant.  LET YOUR CAREGIVER KNOW ABOUT:  · Whether you are pregnant or late for your last menstrual period.  · Allergies to foods or medicines.  · All the medicines you are taking including herbs, eyedrops, and over-the-counter medicines, and creams.  · Use of steroids (by mouth or creams).  · Previous problems with anesthetics or numbing medicine.  · Previous gynecological surgery.  · History of blood clots or bleeding problems.  · Any recent or current vaginal infections (herpes, sexually transmitted infections).  · Other health problems.  RISKS AND COMPLICATIONS  · Bleeding.  · Infection.  · Injury to the vagina, bladder, or rectum.  · Very rare obstruction of the cervical opening that causes problems during menstruation (cervical stenosis).  BEFORE THE PROCEDURE  · Do not take aspirin or blood thinners (anticoagulants) for 1 week before the procedure, or as told by your caregiver.  · Eat a light meal before the procedure.  · Ask your caregiver about changing or stopping your regular medicines.  · You may be given a pain reliever 1 or 2 hours before the procedure.  PROCEDURE   · A tool (speculum) is placed in the vagina. This allows your caregiver to see the cervix.  · An iodine stain is applied to the cervix to find the area of abnormal cells to be removed.  · Medicine is injected to numb the cervix (local  anesthetic).    · Electricity is passed through a thin wire loop which is then used to remove (cauterize) a small segment of the affected cervix.  · Light electrocautery is used to seal any small blood vessels and prevent bleeding.  · A paste may be applied to the cauterized area of the cervix to help prevent bleeding.  · The tissue sample is sent to the lab. It is examined under the microscope.  AFTER THE PROCEDURE  · Have someone drive you home.  · You may have slight to moderate cramping.  · You may notice a black vaginal discharge from the paste used on the cervix to prevent bleeding. This is normal.  · Watch for excessive bleeding. This requires immediate medical care.  · Ask when your test results will be ready. Make sure you get your test results.  Document Released: 04/04/2002 Document Revised: 04/06/2011 Document Reviewed: 06/24/2010  Exit[employer]® Patient Information ©2014 [employer].

## 2012-07-19 ENCOUNTER — Ambulatory Visit (INDEPENDENT_AMBULATORY_CARE_PROVIDER_SITE_OTHER): Payer: 59 | Admitting: Family Medicine

## 2012-07-19 ENCOUNTER — Encounter: Payer: Self-pay | Admitting: Family Medicine

## 2012-07-19 VITALS — BP 145/83 | HR 81 | Wt 175.0 lb

## 2012-07-19 DIAGNOSIS — I10 Essential (primary) hypertension: Secondary | ICD-10-CM

## 2012-07-19 DIAGNOSIS — E119 Type 2 diabetes mellitus without complications: Secondary | ICD-10-CM

## 2012-07-19 LAB — POCT GLYCOSYLATED HEMOGLOBIN (HGB A1C): Hemoglobin A1C: 6.5

## 2012-07-19 NOTE — Progress Notes (Signed)
  Subjective:    Patient ID: Maria Nunez, female    DOB: 02-26-1960, 52 y.o.   MRN: [id number]  HPI DM - no hypoglycemic events. No wounds that aren't healing well. Tolerating metformin well.    HTN- Pt denies chest pain, SOB, dizziness, or heart palpitations.  Taking meds as directed w/o problems.  Denies medication side effects.     Review of Systems     Objective:   Physical Exam  Constitutional: She is oriented to person, place, and time. She appears well-developed and well-nourished.  HENT:  Head: Normocephalic and atraumatic.  Cardiovascular: Normal rate, regular rhythm and normal heart sounds.   Pulmonary/Chest: Effort normal and breath sounds normal.  Neurological: She is alert and oriented to person, place, and time.  Skin: Skin is warm and dry.  Psychiatric: She has a normal mood and affect. Her behavior is normal.          Assessment & Plan:  DM- Well controlled.  On statin, ASA, ARB.  F/U in 4 months.  Due for CMP and lipids Lab Results  Component Value Date   HGBA1C 6.5 07/19/2012   HTN- up today but normally well controlled. Will follow.  Continue current regimen.    Has her colnooscopy scheduled for august.

## 2012-07-20 ENCOUNTER — Ambulatory Visit: Payer: 59 | Admitting: Family Medicine

## 2012-07-21 ENCOUNTER — Telehealth: Payer: Self-pay | Admitting: Obstetrics and Gynecology

## 2012-07-21 NOTE — Telephone Encounter (Signed)
LMTCB to discuss insurance benefits and schedule LEEP.

## 2012-07-25 LAB — COMPLETE METABOLIC PANEL WITH GFR
ALT: 19 U/L (ref 0–35)
AST: 15 U/L (ref 0–37)
Albumin: 4.4 g/dL (ref 3.5–5.2)
Alkaline Phosphatase: 71 U/L (ref 39–117)
BUN: 10 mg/dL (ref 6–23)
CO2: 25 mEq/L (ref 19–32)
Calcium: 9.5 mg/dL (ref 8.4–10.5)
Chloride: 102 mEq/L (ref 96–112)
Creat: 0.6 mg/dL (ref 0.50–1.10)
GFR, Est African American: >89 mL/min
GFR, Est Non African American: >89 mL/min
Glucose, Bld: 105 mg/dL — ABNORMAL HIGH (ref 70–99)
Potassium: 3.7 mEq/L (ref 3.5–5.3)
Sodium: 138 mEq/L (ref 135–145)
Total Bilirubin: 0.3 mg/dL (ref 0.3–1.2)
Total Protein: 6.8 g/dL (ref 6.0–8.3)

## 2012-07-25 LAB — LIPID PANEL
Cholesterol: 198 mg/dL (ref 0–200)
HDL: 48 mg/dL (ref >39)
LDL Cholesterol: 118 mg/dL — ABNORMAL HIGH (ref 0–99)
Total CHOL/HDL Ratio: 4.1 Ratio
Triglycerides: 162 mg/dL — ABNORMAL HIGH (ref <150)
VLDL: 32 mg/dL (ref 0–40)

## 2012-07-27 ENCOUNTER — Telehealth: Payer: Self-pay | Admitting: *Deleted

## 2012-07-27 NOTE — Telephone Encounter (Signed)
Patient's LEEP is scheduled for 09-08-12 per patient's request and scheduling restrictions.

## 2012-07-27 NOTE — Telephone Encounter (Signed)
Message copied by Alisa Graff on Wed Jul 27, 2012  5:33 PM ------      Message from: Conley Simmonds      Created: Thu Jul 14, 2012 10:47 PM      Regarding: Precert LEEP       Please proceed with precert for LEEP procedure in the office.      Indication - ASCUS, high risk HPV positive, unsatisfactory colposcopy, and long standing abnormal pap smears.             Thank you.            Conley Simmonds ------

## 2012-08-01 ENCOUNTER — Ambulatory Visit: Payer: 59 | Admitting: Physician Assistant

## 2012-08-03 ENCOUNTER — Ambulatory Visit (INDEPENDENT_AMBULATORY_CARE_PROVIDER_SITE_OTHER): Payer: 59 | Admitting: Physician Assistant

## 2012-08-03 ENCOUNTER — Encounter: Payer: Self-pay | Admitting: Physician Assistant

## 2012-08-03 VITALS — BP 138/74 | HR 82 | Temp 97.8°F | Wt 175.0 lb

## 2012-08-03 DIAGNOSIS — J019 Acute sinusitis, unspecified: Secondary | ICD-10-CM

## 2012-08-03 DIAGNOSIS — E119 Type 2 diabetes mellitus without complications: Secondary | ICD-10-CM

## 2012-08-03 LAB — POCT UA - MICROALBUMIN
Albumin/Creatinine Ratio, Urine, POC: <30
Creatinine, POC: 50 mg/dL
Microalbumin Ur, POC: 10 mg/L

## 2012-08-03 MED ORDER — FLUTICASONE PROPIONATE 50 MCG/ACT NA SUSP: 2.0000 | Freq: Every day | NASAL | Status: DC

## 2012-08-03 MED ORDER — AZITHROMYCIN 250 MG PO TABS: ORAL_TABLET | ORAL | Status: DC

## 2012-08-03 NOTE — Patient Instructions (Addendum)
Sinus rinses [facility]   Sinusitis Sinusitis is redness, soreness, and swelling (inflammation) of the paranasal sinuses. Paranasal sinuses are air pockets within the bones of your face (beneath the eyes, the middle of the forehead, or above the eyes). In healthy paranasal sinuses, mucus is able to drain out, and air is able to circulate through them by way of your nose. However, when your paranasal sinuses are inflamed, mucus and air can become trapped. This can allow bacteria and other germs to grow and cause infection. Sinusitis can develop quickly and last only a short time (acute) or continue over a long period (chronic). Sinusitis that lasts for more than 12 weeks is considered chronic.  CAUSES  Causes of sinusitis include:  Allergies.  Structural abnormalities, such as displacement of the cartilage that separates your nostrils (deviated septum), which can decrease the air flow through your nose and sinuses and affect sinus drainage.  Functional abnormalities, such as when the small hairs (cilia) that line your sinuses and help remove mucus do not work properly or are not present. SYMPTOMS  Symptoms of acute and chronic sinusitis are the same. The primary symptoms are pain and pressure around the affected sinuses. Other symptoms include:  Upper toothache.  Earache.  Headache.  Bad breath.  Decreased sense of smell and taste.  A cough, which worsens when you are lying flat.  Fatigue.  Fever.  Thick drainage from your nose, which often is green and may contain pus (purulent).  Swelling and warmth over the affected sinuses. DIAGNOSIS  Your caregiver will perform a physical exam. During the exam, your caregiver may:  Look in your nose for signs of abnormal growths in your nostrils (nasal polyps).  Tap over the affected sinus to check for signs of infection.  View the inside of your sinuses (endoscopy) with a special imaging device with a light attached (endoscope), which is  inserted into your sinuses. If your caregiver suspects that you have chronic sinusitis, one or more of the following tests may be recommended:  Allergy tests.  Nasal culture A sample of mucus is taken from your nose and sent to a lab and screened for bacteria.  Nasal cytology A sample of mucus is taken from your nose and examined by your caregiver to determine if your sinusitis is related to an allergy. TREATMENT  Most cases of acute sinusitis are related to a viral infection and will resolve on their own within 10 days. Sometimes medicines are prescribed to help relieve symptoms (pain medicine, decongestants, nasal steroid sprays, or saline sprays).  However, for sinusitis related to a bacterial infection, your caregiver will prescribe antibiotic medicines. These are medicines that will help kill the bacteria causing the infection.  Rarely, sinusitis is caused by a fungal infection. In theses cases, your caregiver will prescribe antifungal medicine. For some cases of chronic sinusitis, surgery is needed. Generally, these are cases in which sinusitis recurs more than 3 times per year, despite other treatments. HOME CARE INSTRUCTIONS   Drink plenty of water. Water helps thin the mucus so your sinuses can drain more easily.  Use a humidifier.  Inhale steam 3 to 4 times a day (for example, sit in the bathroom with the shower running).  Apply a warm, moist washcloth to your face 3 to 4 times a day, or as directed by your caregiver.  Use saline nasal sprays to help moisten and clean your sinuses.  Take over-the-counter or prescription medicines for pain, discomfort, or fever only as directed  by your caregiver. SEEK IMMEDIATE MEDICAL CARE IF:  You have increasing pain or severe headaches.  You have nausea, vomiting, or drowsiness.  You have swelling around your face.  You have vision problems.  You have a stiff neck.  You have difficulty breathing. MAKE SURE YOU:   Understand these  instructions.  Will watch your condition.  Will get help right away if you are not doing well or get worse. Document Released: 01/12/2005 Document Revised: 04/06/2011 Document Reviewed: 01/27/2011 [facility] Patient Information 2014 [place], [place].

## 2012-08-03 NOTE — Progress Notes (Signed)
  Subjective:    Patient ID: Maria Nunez, female    DOB: 11-17-1960, 52 y.o.   MRN: [id number]  HPI Patient presents to the clinic with 3 weeks of sinus pressure, bilateral ear pain, teeth pain, sore throat. She denies cough, wheezing, vomiting, diarrhea. She has had some nausea. She has tried tylenol and ibuprofen and helped some but continues to worsen. She has gotten a few migraines in last 3 weeks she believes from all the sinus pressure. She continues to eat and drink. No fever or chills.    Review of Systems     Objective:   Physical Exam  Constitutional: She appears well-developed and well-nourished.  HENT:  Head: Normocephalic and atraumatic.  Right Ear: External ear normal.  Left Ear: External ear normal.  TM's normal.   Bilateral frontal sinus tenderness to palpation.   Oropharynx red but no swelling or exudate.   Bilateral turbinates red and swollen.  Eyes: Conjunctivae are normal. Right eye exhibits no discharge. Left eye exhibits no discharge.  Neck: Normal range of motion. Neck supple.  Cardiovascular: Normal rate, regular rhythm and normal heart sounds.   Pulmonary/Chest: Effort normal and breath sounds normal. She has no wheezes.  Lymphadenopathy:    She has no cervical adenopathy.  Skin: Skin is warm and dry.  Psychiatric: She has a normal mood and affect. Her behavior is normal.          Assessment & Plan:  Sinusitis- Treated with zpak due to rest of allergies. Also gave flonase to use as needed. Recommended neil med sinus rinse. Gave HO. Call if not improving.   DM- needs microabulmin done via my chart. Urine sample was obtained today and less than 30 for ratio.

## 2012-08-10 ENCOUNTER — Telehealth: Payer: Self-pay | Admitting: *Deleted

## 2012-08-10 NOTE — Telephone Encounter (Signed)
Patient canceled LEEP procedure due to cost.  Has not rescheduled. Call to patient to follow up on rescheduling. LMTCB.

## 2012-08-11 NOTE — Telephone Encounter (Signed)
LMTCB

## 2012-08-11 NOTE — Telephone Encounter (Signed)
Thank you :)

## 2012-08-11 NOTE — Telephone Encounter (Signed)
Patient returns call.  States she does not have ability to pay OOP estimated cost. She states she just wants to return in 6 months as she can not afford "another test". Discussed that this was a LEEP for treatment and is not another test. Treatment recommended to prevent progression of abnormal cells up to and including cervical cancer.  Patient asking if we can recheck her cost after first test was applied to deductible and if we can do payment arrangement.  Advised i would check with administration and return her call next week. (admin out until Tues). Patient agreeable.

## 2012-08-16 ENCOUNTER — Telehealth: Payer: Self-pay | Admitting: *Deleted

## 2012-08-16 ENCOUNTER — Other Ambulatory Visit: Payer: Self-pay | Admitting: *Deleted

## 2012-08-16 MED ORDER — METFORMIN HCL ER 500 MG PO TB24: 500.0000 mg | ORAL_TABLET | Freq: Every day | ORAL | Status: DC

## 2012-08-17 NOTE — Telephone Encounter (Signed)
Follow up call to patient to discuss options. LMTCB.

## 2012-08-18 NOTE — Telephone Encounter (Signed)
Patient returning Sally's call. °

## 2012-08-19 ENCOUNTER — Other Ambulatory Visit: Payer: Self-pay | Admitting: Obstetrics and Gynecology

## 2012-08-19 DIAGNOSIS — IMO0002 Reserved for concepts with insufficient information to code with codable children: Secondary | ICD-10-CM

## 2012-08-22 ENCOUNTER — Encounter: Payer: Self-pay | Admitting: Family Medicine

## 2012-08-22 NOTE — Telephone Encounter (Signed)
Administrator, Vangie Bicker spoke with patient regarding financial arrangements and appt scheduled.

## 2012-08-26 NOTE — Telephone Encounter (Signed)
Maria Nunez, Research officer, political party spoke to patient. Appt scheduled.

## 2012-09-08 ENCOUNTER — Ambulatory Visit: Payer: 59 | Admitting: Obstetrics and Gynecology

## 2012-09-16 ENCOUNTER — Ambulatory Visit: Payer: 59 | Admitting: Obstetrics and Gynecology

## 2012-09-30 ENCOUNTER — Ambulatory Visit: Payer: 59 | Admitting: Obstetrics and Gynecology

## 2012-09-30 ENCOUNTER — Ambulatory Visit: Payer: 59

## 2012-10-04 ENCOUNTER — Other Ambulatory Visit: Payer: Self-pay | Admitting: Family Medicine

## 2012-10-10 ENCOUNTER — Ambulatory Visit (INDEPENDENT_AMBULATORY_CARE_PROVIDER_SITE_OTHER): Payer: 59 | Admitting: Family Medicine

## 2012-10-10 VITALS — BP 142/86 | HR 77 | Ht 59.0 in | Wt 174.0 lb

## 2012-10-10 DIAGNOSIS — E119 Type 2 diabetes mellitus without complications: Secondary | ICD-10-CM

## 2012-10-10 LAB — HEMOGLOBIN A1C: Hgb A1c MFr Bld: 6.6 % — AB (ref 4.0–6.0)

## 2012-10-10 NOTE — Progress Notes (Signed)
Subjective:  Patient presents today for 3 month diabetes follow-up as part of the employer-sponsored Link to Wellness program. Current diabetes regimen includes metformin 500 mg ER once daily. Patient also continues on daily ASA, ARB, and statin. Most recent MD follow-up was in June. No med changes or major health changes at this time.   Patient's father passed away earlier in the summer and she admits that she hasn't been checking her blood sugar.    Disease Assessments:  Diabetes:  Sees Diabetes provider 2 times per year; Type of Diabetes: Type 2; checks feet daily; MD managing Diabetes [place]; takes medications as prescribed; takes an aspirin a day; Diabetes Education classes at [facility];   checks blood glucose Never; does not use glucometer; hypoglycemia frequency rarely;   Other Diabetes History: Patient has not been checking her blood sugar at all since the end of June when her Dad got sick.   She had some low blood sugars when she was in [place]. She voiced recognition of how to correct low blood sugar.     Physical Activity-  Stil spending some time gardening. She hasn't been walking the dogs or going to the gym since she returned from her father's funeral.   Nutrition-  24 hour recall-  B- bowl of cheerios  morning snack- yogurt with granola  Lunch- egg salad sandwich, small bag of fritos, green tea diet  Dinner- spaghetti with meatballs. She aims for a fistful serving size.   Dessert- 1 cookie- Reunion keebler cookie       Hemoglobin A1c: 10/10/2012 6.6    Dilated Eye Exam: 10/15/2011  Foot Exam: 07/19/2012  Other Preventive Care Notes: Dental Exam- January 2014   Vital Signs:  10/10/2012 4:44 PM (EST) Blood Pressure 142 / 86 mm/HgBMI 35.1; Height 4 ft 11 in; Pulse Rate 77 bpm; Weight 174 lbs    Testing:  Blood Sugar Tests:  Hemoglobin A1c: 6.6 resulted on 10/10/2012    Assessment/Plan: Patient is a 52 year old female with DM2. A1C today was 6.6% and this is  consistent with her prior reading of 6.4% in June with Dr. Linford Arnold. Patient feels like she is not in routine at the moment. Her Dad passed away in 09-12-2022 and since he got sick she hasn't been checking her blood sugar or exercising. She has continued to take her medication. She admitted that for a while she wasn't eating very much but has since returned to her normal eating habits. She has lost about 3 pounds since her last appointment with me.   Patient wants to get back on track and agreed to start checking her blood sugar again. She also wants to start walking again on her treadmill. She agreed to start walking three days a week.   I will fax A1C result to Dr. Patrcia Dolly office. Follow up with physical activity and whether or not she is checking her blood sugar..    Goals For Next Visit-  1. Start checking blood sugar twice daily again.  2. Start physical activity again. Start walking on the treadmill three days a week for 30 minutes.  3. Follow up with the dietitian.   Next appointment with me is Monday January 12th at 4 PM.

## 2012-10-11 ENCOUNTER — Encounter: Payer: Self-pay | Admitting: *Deleted

## 2012-11-18 ENCOUNTER — Encounter: Payer: Self-pay | Admitting: Family Medicine

## 2012-11-18 ENCOUNTER — Ambulatory Visit (INDEPENDENT_AMBULATORY_CARE_PROVIDER_SITE_OTHER): Payer: 59 | Admitting: Family Medicine

## 2012-11-18 VITALS — BP 127/67 | HR 79 | Wt 172.0 lb

## 2012-11-18 DIAGNOSIS — I10 Essential (primary) hypertension: Secondary | ICD-10-CM

## 2012-11-18 DIAGNOSIS — E119 Type 2 diabetes mellitus without complications: Secondary | ICD-10-CM

## 2012-11-18 MED ORDER — METFORMIN HCL ER 500 MG PO TB24: 500.0000 mg | ORAL_TABLET | Freq: Every day | ORAL | Status: DC

## 2012-11-18 NOTE — Progress Notes (Signed)
Pt reports eye exam done with [facility]last Tuesday (11/08/12).Loralee Pacas [place]

## 2012-11-18 NOTE — Progress Notes (Signed)
  Subjective:    Patient ID: Maria Nunez, female    DOB: 08-Dec-1960, 52 y.o.   MRN: [id number]  HPI DM- On metformin.  No hypoglycemic events. No wounds or sores taht are not healing well. Father passed away in 09-06-2022.  Starting to exercise some.   HTN-  Pt denies chest pain, SOB, dizziness, or heart palpitations.  Taking meds as directed w/o problems.  Denies medication side effects.   Lab Results  Component Value Date   CHOL 198 07/25/2012   HDL 48 07/25/2012   LDLCALC 118* 07/25/2012   TRIG 162* 07/25/2012   CHOLHDL 4.1 07/25/2012     Review of Systems     Objective:   Physical Exam  Constitutional: She is oriented to person, place, and time. She appears well-developed and well-nourished.  HENT:  Head: Normocephalic and atraumatic.  Cardiovascular: Normal rate, regular rhythm and normal heart sounds.   Pulmonary/Chest: Effort normal and breath sounds normal.  Neurological: She is alert and oriented to person, place, and time.  Skin: Skin is warm and dry.  Psychiatric: She has a normal mood and affect. Her behavior is normal.          Assessment & Plan:  DM-  Well controlled. On ARB, ASA, statin.  Had eye exam 2 weeks ago.  NOrmla checkup.  A1C is 6.4 which is fantastic.  Call if having lows.  F/U in 3-4 mo. REF sent to pharm.   HTN- Well controlled.  F/u in 6 mo.   Has reschedule her collonoscopy for Jan

## 2012-11-28 ENCOUNTER — Encounter: Payer: Self-pay | Admitting: Dietician

## 2012-11-28 ENCOUNTER — Encounter: Payer: 59 | Attending: Family Medicine | Admitting: Dietician

## 2012-11-28 VITALS — Ht 59.0 in | Wt 174.7 lb

## 2012-11-28 DIAGNOSIS — Z713 Dietary counseling and surveillance: Secondary | ICD-10-CM | POA: Insufficient documentation

## 2012-11-28 DIAGNOSIS — E669 Obesity, unspecified: Secondary | ICD-10-CM | POA: Insufficient documentation

## 2012-11-28 NOTE — Patient Instructions (Addendum)
Try Anne Fu and Fit [place] Yogurt (12 g of carbs). Fill up half of your plate at lunch and dinner with vegetables, and consider buying pre-washed salad greens or other veggies. Consider measuring out portions of cereal you have in the morning. Consider switching to 2% milk from 1%. Limit carbohydrates at snacks to 15 grams and have protein at that time too. Limit chips and cookies to special occassions. Continue drinking mostly water or other non calorie drinks.  Aim to get 30 minutes of exercise 5 x week.

## 2012-11-28 NOTE — Progress Notes (Signed)
  Medical Nutrition Therapy:  Appt start time: 1630 end time:  1700.  Assessment:  Primary concerns today: Maria Nunez is here today since she is interested in weight loss. Has previously attended the Core Classes and current Hgb A1c is 6.5%. Testing blood sugar 2 x day and has been ranging from 91-114.   Would like to lose 15-30 pounds. Starting exercising but has not made additional changes to her diet yet.   Maria Nunez lives with her two her two girls and works full time.   Preferred Learning Style:   No preference indicated   Learning Readiness:   Ready  MEDICATIONS: see list   DIETARY INTAKE:   Avoided foods include fish    24-hr recall:  B ( AM): cheerios or Special K with 2% milk  Snk ( AM): kind bar or flavored yogurt with granola with water L ( PM): chicken salad or [place] sometimes with chips with diet lipton green tea or water Snk ( PM): sometimes fruit cup D ( PM): English muffin with egg or peanut butter and jelly or bowl of cereal with water Snk ( PM): cheese stick, chips, or cookie Beverages: water, sometimes diet green tea  Usual physical activity: treadmill 3+ x week for 30-45 minutes  Estimated energy needs: 1500 calories 170 g carbohydrates 112 g protein 42 g fat  Progress Towards Goal(s):  In progress.   Nutritional Diagnosis:  [place]-3.3 Overweight/obesity As related to history of frequent snacking and large portions.  As evidenced by BMI of 35.3 and Hgb A1c of 6.5%.    Intervention:  Nutrition counseling provided.   Plan: Try Anne Fu and Fit [place] Yogurt (12 g of carbs). Fill up half of your plate at lunch and dinner with vegetables, and consider buying pre-washed salad greens or other veggies. Consider measuring out portions of cereal you have in the morning. Consider switching to 2% milk from 1%. Limit carbohydrates at snacks to 15 grams and have protein at that time too. Limit chips and cookies to special occassions. Continue drinking mostly  water or other non calorie drinks.  Aim to get 30 minutes of exercise 5 x week.  Teaching Method Utilized:  Visual Auditory  Handouts given during visit include:  MyPlate Handout  16X CHO Snacks   Barriers to learning/adherence to lifestyle change: none  Demonstrated degree of understanding via:  Teach Back   Monitoring/Evaluation:  Dietary intake, exercise, and body weight prn.

## 2013-01-27 ENCOUNTER — Telehealth: Payer: Self-pay | Admitting: Obstetrics and Gynecology

## 2013-01-27 NOTE — Telephone Encounter (Signed)
Lmtcb/called patient to inform of liability for LEEP to be performed 01.15.2015/PR $156.61//ssf

## 2013-01-30 NOTE — Telephone Encounter (Signed)
Patient returned call. Patient was advised of quoted patient liability of $156.61.//ssf

## 2013-02-06 ENCOUNTER — Telehealth: Payer: Self-pay | Admitting: Obstetrics and Gynecology

## 2013-02-06 NOTE — Telephone Encounter (Signed)
Pt cx appointment for today Please call to reschedule Leep appointment.

## 2013-02-06 NOTE — Telephone Encounter (Signed)
Left message for patient to call back and reschedule  LEEP

## 2013-02-09 ENCOUNTER — Ambulatory Visit: Payer: 59 | Admitting: Obstetrics and Gynecology

## 2013-02-21 ENCOUNTER — Other Ambulatory Visit: Payer: Self-pay | Admitting: Family Medicine

## 2013-02-27 ENCOUNTER — Ambulatory Visit (INDEPENDENT_AMBULATORY_CARE_PROVIDER_SITE_OTHER): Payer: Self-pay | Admitting: Family Medicine

## 2013-02-27 VITALS — BP 144/81 | HR 75 | Ht 59.0 in | Wt 175.4 lb

## 2013-02-27 DIAGNOSIS — E119 Type 2 diabetes mellitus without complications: Secondary | ICD-10-CM

## 2013-02-27 LAB — HEMOGLOBIN A1C: Hgb A1c MFr Bld: 6.6 % — AB (ref 4.0–6.0)

## 2013-02-27 NOTE — Progress Notes (Signed)
Subjective:  Patient presents today for 3 month diabetes follow-up as part of the employer-sponsored Link to Wellness program. Current diabetes regimen includes Metformin 500 mg ER once daily. Patient also continues on daily ASA, ACEi, and statin.   Patient reports no changes since her last appointment with me. Last appointment with Dr. Madilyn Fireman was last year. She has an appointment pending for the end of the month.    Disease Assessments:  Diabetes:  Sees Diabetes provider 2 times per year; Type of Diabetes: Type 2; checks feet daily; MD managing Diabetes [place]; takes medications as prescribed; takes an aspirin a day; Diabetes Education classes at [facility]; hypoglycemia frequency rarely;   7 day CBG average 117; 14 day CBG average 124; 30 day CBG average 128; checks blood glucose 2 times a day; uses glucometer;   Highest CBG 164; Lowest CBG 104; Other Diabetes History: Patient is checking twice daily- once in the morning fasting and then again before bedtime. Bedtime numbers are usually around 140.    Physical Activity-  She is going to the gym twice a week for 2 hours with her daughter. She is doing a combination of a 30 minute cardio circuit, treadmill and bicycle. This is an increase from her previous visit. She also has a treadmill at home.   Nutrition-  Patient reports no changes. She recently saw an RD with the [facility] and the RD said she was doing good. She has made the change to light yogurt.  Testing:  Blood Sugar Tests:  Hemoglobin A1c: 6.6    Assessment/Plan:  Patient is a 53 year old female with DM2. A1C today was 6.6% and is meeting goal of less than 7%. Weight is up 2 pounds to 175.4.   Patient has made some good changes since she saw me last. She is now engaging in more regular physical activity. She is going to the gym twice a week with her daughter and is spending >150 minutes weekly in physical activity. She states that she hasn't noticed a change on the scale, but she has  noticed that her face feels thinner and her clothes are fitting better. She also saw a dietitian in November and has been following her recommendations.   I will fax A1C results to Dr. Gardiner Ramus office. Follow up with patient in 6 months..    Goals for Next Visit-  1. On the days that you exercise, make sure that you log that into the [employer].  2. Start attending the step class at [facility] once a week.   Next appointment is Monday July 20th at 4 PM.

## 2013-02-28 ENCOUNTER — Encounter: Payer: Self-pay | Admitting: *Deleted

## 2013-03-02 NOTE — Progress Notes (Signed)
Patient ID: Maria Nunez, female   DOB: 03/20/1960, 53 y.o.   MRN: [id number] ATTENDING PHYSICIAN NOTE: I have reviewed the chart and agree with the plan as detailed above. Dorcas Mcmurray MD Pager [phone]

## 2013-03-21 ENCOUNTER — Ambulatory Visit: Payer: 59 | Admitting: Family Medicine

## 2013-03-24 ENCOUNTER — Telehealth: Payer: Self-pay | Admitting: *Deleted

## 2013-03-24 ENCOUNTER — Encounter: Payer: Self-pay | Admitting: Family Medicine

## 2013-03-24 ENCOUNTER — Ambulatory Visit (INDEPENDENT_AMBULATORY_CARE_PROVIDER_SITE_OTHER): Payer: 59 | Admitting: Family Medicine

## 2013-03-24 ENCOUNTER — Ambulatory Visit (INDEPENDENT_AMBULATORY_CARE_PROVIDER_SITE_OTHER): Payer: 59 | Admitting: Obstetrics and Gynecology

## 2013-03-24 ENCOUNTER — Encounter: Payer: Self-pay | Admitting: Obstetrics and Gynecology

## 2013-03-24 VITALS — BP 140/80 | HR 84 | Ht 59.5 in | Wt 180.0 lb

## 2013-03-24 VITALS — BP 160/78 | HR 92 | Temp 98.4°F | Wt 179.0 lb

## 2013-03-24 DIAGNOSIS — B9689 Other specified bacterial agents as the cause of diseases classified elsewhere: Secondary | ICD-10-CM

## 2013-03-24 DIAGNOSIS — R6889 Other general symptoms and signs: Secondary | ICD-10-CM

## 2013-03-24 DIAGNOSIS — M7711 Lateral epicondylitis, right elbow: Secondary | ICD-10-CM | POA: Insufficient documentation

## 2013-03-24 DIAGNOSIS — M7710 Lateral epicondylitis, unspecified elbow: Secondary | ICD-10-CM

## 2013-03-24 DIAGNOSIS — IMO0002 Reserved for concepts with insufficient information to code with codable children: Secondary | ICD-10-CM

## 2013-03-24 DIAGNOSIS — J329 Chronic sinusitis, unspecified: Secondary | ICD-10-CM

## 2013-03-24 DIAGNOSIS — A499 Bacterial infection, unspecified: Secondary | ICD-10-CM

## 2013-03-24 MED ORDER — AZITHROMYCIN 250 MG PO TABS: ORAL_TABLET | ORAL | Status: DC

## 2013-03-24 NOTE — Patient Instructions (Signed)
We will call with your pap and HPV results.

## 2013-03-24 NOTE — Progress Notes (Signed)
Patient ID: Maria Nunez, female   DOB: 1960/10/08, 53 y.o.   MRN: [id number] GYNECOLOGY PROBLEM VISIT  PCP:   Referring provider:   HPI: 53 y.o.   Divorced  Caucasian  female   [phone] with No LMP recorded. Patient has had a hysterectomy.   here for  Repeat pap.  Never had LEEP. Father died and had barriers to proceeding.   Not feeling well today. Has an appointment with PCP.  Thinks she has sinusitis.   GYNECOLOGIC HISTORY: No LMP recorded. Patient has had a hysterectomy. Sexually active:  no Partner preference: female Contraception:  Supracervical hysterectomy   Menopausal hormone therapy: no DES exposure:  no  Blood transfusions:   no Sexually transmitted diseases:   HPV GYN procedures and prior surgeries:  C-section, Supracervical hysterectomy Last mammogram:  05-20-12 wnl:[facility]               Last pap and high risk HPV testing:   06-16-12 Ascus:Pos. HR HPV History of abnormal pap smear: Hx of colposcopy and Pos. HR HPV    OB History   Grav Para Term Preterm Abortions TAB SAB Ect Mult Living   3 2 2  1  1   2          Family History  Problem Relation Age of Onset  . Asthma Mother   . COPD Mother   . Diabetes Mother   . Hypertension Mother   . Diabetes Father   . Hypertension Father   . Breast cancer Maternal Aunt   . Breast cancer Paternal Aunt     Patient Active Problem List   Diagnosis Date Noted  . LGSIL (low grade squamous intraepithelial dysplasia) 06/16/2012  . Plantar fasciosis 11/26/2011  . Type II or unspecified type diabetes mellitus without mention of complication, not stated as uncontrolled 07/02/2009  . PANIC ATTACK 09/07/2008  . HYPERTENSION 04/27/2008  . RESTLESS LEG SYNDROME 04/02/2008  . POLYARTHRITIS 04/02/2008  . KNEE PAIN, LEFT 01/02/2008  . HOT FLASHES 08/04/2006  . HYPERCHOLESTEROLEMIA 11/03/2005  . HYPERTENSION, BENIGN SYSTEMIC 11/03/2005    Past Medical History  Diagnosis Date  . Hypertension   . Allergy   .  Hidradenitis   . Diabetes mellitus     borderline  . Fibroid   . Heart murmur     Past Surgical History  Procedure Laterality Date  . Cesarean section    . Inguinal hidradenitis excision    . Breast surgery      cysts removed right breast  . Abdominal hysterectomy      supracervical    ALLERGIES: Cefaclor; Hycodan; Penicillins; Sulfonamide derivatives; and Tetracycline  Current Outpatient Prescriptions  Medication Sig Dispense Refill  . aspirin 81 MG tablet Take 81 mg by mouth daily.        . fluticasone (FLONASE) 50 MCG/ACT nasal spray Place 2 sprays into both nostrils daily as needed for allergies or rhinitis.      [name] FOLIC ACID PO Take 182 mcg by mouth daily.       Elmore Guise Device MISC        . losartan-hydrochlorothiazide (HYZAAR) 100-12.5 MG per tablet TAKE 1 TABLET BY MOUTH DAILY  90 tablet  PRN  . metFORMIN (GLUCOPHAGE XR) 500 MG 24 hr tablet Take 1 tablet (500 mg total) by mouth daily with breakfast.  90 tablet  3  . Multiple Vitamin (MULTIVITAMIN) tablet Take 1 tablet by mouth daily.        . Omega-3 Fatty Acids (  FISH OIL) 1000 MG CAPS Take 1,000 mg by mouth daily. 4 tabs      . pravastatin (PRAVACHOL) 40 MG tablet TAKE 1 TABLET BY MOUTH DAILY AT BEDTIME.  90 tablet  1  . TRUETEST TEST test strip TEST BLOOD SUGAR TWICE DAILY  200 each  2  . vitamin B-12 (CYANOCOBALAMIN) 100 MCG tablet Take 50 mcg by mouth daily.        . Zinc Sulfate (ZINC 15 PO) Take by mouth.         No current facility-administered medications for this visit.     ROS:  Pertinent items are noted in HPI.  SOCIAL HISTORY:  Divorced.   PHYSICAL EXAMINATION:    BP 140/80  Pulse 84  Ht 4' 11.5" (1.511 m)  Wt 180 lb (81.647 kg)  BMI 35.76 kg/m2   Wt Readings from Last 3 Encounters:  03/24/13 180 lb (81.647 kg)  02/27/13 175 lb 6.4 oz (79.561 kg)  11/28/12 174 lb 11.2 oz (79.243 kg)     Ht Readings from Last 3 Encounters:  03/24/13 4' 11.5" (1.511 m)  02/27/13 4\' 11"  (1.499 m)  11/28/12 4'  11" (1.499 m)    General appearance: alert, cooperative and appears stated age.  Looks tired.   Neurologic: Grossly normal  Pelvic: External genitalia:  no lesions              Urethra:  normal appearing urethra with no masses, tenderness or lesions              Bartholins and Skenes: normal                 Vagina: normal appearing vagina with normal color and discharge, no lesions              Cervix: normal appearance                   Bimanual Exam:  Uterus:   absent                                      Adnexa:  no masses                                      Rectovaginal: Confirms                                      Anus:  normal sphincter tone, no lesions  ASSESSMENT  Status post supracervical hysterectomy.  ASCUS pap and High risk HPV positive.  Previous unsatisfactory colposcopy.  Delay in proceeding with LEEP procedure.   PLAN  Pap and high risk HPV testing. Will consider Cytotec and vaginal estrogen cream for colposcopy if this pap mandates this.  Return prn.   10 minutes face to face time of which over 50% was spent in counseling.    An After Visit Summary was printed and given to the patient.

## 2013-03-24 NOTE — Telephone Encounter (Signed)
Ok to remove order for now.

## 2013-03-24 NOTE — Telephone Encounter (Signed)
Call to patient to discuss LEEP scheduled for today.  Patient States she didn't intend to come today, didn't realize it was even rescheduled because she does not have the money to pay for it.  Advised that this appointment has been rescheduled several times and that enough time has passed now that we recommend OV and pap to reassess her current status before proceeding with LEEP.  Patient agrees to keep appointment today to discuss this.  Dr Quincy Simmonds, [facility] Rainier.  Routing to provider for final review. Patient agreeable to disposition. Will close encounter

## 2013-03-24 NOTE — Telephone Encounter (Signed)
See previous phone note, order for LEEP removed. Agreeable?

## 2013-03-24 NOTE — Progress Notes (Signed)
CC: Maria Nunez is a 53 y.o. female is here for Sinusitis   Subjective: HPI:  Complains of facial pressure localized both the eyes in the for head described only as pressure moderate in severity worse in the evenings has been present for 2-3 weeks worsening on a weekly basis. Accompanied by thick nasal discharge and fatigue over the past week. Interventions have included nasal steroids no other interventions. Symptoms are present all hours of the day but worse in the evening when lying down.  Denies cough, shortness of breath, wheezing, fevers, chills, motor sensory disturbances.  Complains of left elbow pain that has been present since November 2014. It is described as a mild to moderate sharp localized in the lateral aspect of the elbow nonradiating. Worse with turning doorknobs or opening lids.  Interventions have included icing and counterstrain bracing Neither of which have been of much benefit.  Denies recent or remote over exertion or trauma. Denies overlying skin changes, swelling, redness or warmth  Review Of Systems Outlined In HPI  Past Medical History  Diagnosis Date  . Hypertension   . Allergy   . Hidradenitis   . Diabetes mellitus     borderline  . Fibroid   . Heart murmur     Past Surgical History  Procedure Laterality Date  . Cesarean section    . Inguinal hidradenitis excision    . Breast surgery      cysts removed right breast  . Abdominal hysterectomy      supracervical   Family History  Problem Relation Age of Onset  . Asthma Mother   . COPD Mother   . Diabetes Mother   . Hypertension Mother   . Diabetes Father   . Hypertension Father   . Breast cancer Maternal Aunt   . Breast cancer Paternal Aunt     History   Social History  . Marital Status: Divorced    Spouse Name: N/A    Number of Children: N/A  . Years of Education: N/A   Occupational History  . Not on file.   Social History Main Topics  . Smoking status: Never Smoker   .  Smokeless tobacco: Never Used  . Alcohol Use: No  . Drug Use: No  . Sexual Activity: No     Comment: TAH/supracervical   Other Topics Concern  . Not on file   Social History Narrative  . No narrative on file     Objective: BP 160/78  Pulse 92  Temp(Src) 98.4 F (36.9 C) (Oral)  Wt 179 lb (81.194 kg)  General: Alert and Oriented, No Acute Distress HEENT: Pupils equal, round, reactive to light. Conjunctivae clear.  External ears unremarkable, canals clear with intact TMs with appropriate landmarks.  Middle ear appears open without effusion. Pink inferior turbinates.  Moist mucous membranes, pharynx without inflammation nor lesions.  Neck supple without palpable lymphadenopathy nor abnormal masses. Lungs: Clear to auscultation bilaterally, no wheezing/ronchi/rales.  Comfortable work of breathing. Good air movement. Extremities: No peripheral edema.  Strong peripheral pulses. Full range of motion and strength of the left elbow pain is reproduced with palpation of lateral epicondyle or resisted wrist extension no overlying skin changes  Mental Status: No depression, anxiety, nor agitation. Skin: Warm and dry.  Assessment & Plan: Maria Nunez was seen today for sinusitis.  Diagnoses and associated orders for this visit:  Bacterial sinusitis - azithromycin (ZITHROMAX) 250 MG tablet; Take two tabs at once on day 1, then one tab daily on days 2-5.  Tennis elbow    Tennis elbow: Discussed continuing use of counterstrain but adding home exercise routine involving conditioning and stretching with range of motion exercises. A handout was provided to her today to be completed on a daily basis the next 2-3 weeks. Return to see Dr. Darene Lamer. in sports medicine for reevaluation if not improving at the end of this time. Bacterial sinusitis: Discussed either Levaquin or azithromycin she would prefer a Z-Pak encouraged use nasal saline washes consider Coricidin HBP   Return in about 3 weeks (around  04/14/2013).

## 2013-03-24 NOTE — Telephone Encounter (Signed)
Completed, encounter closed.

## 2013-03-29 ENCOUNTER — Encounter: Payer: Self-pay | Admitting: Family Medicine

## 2013-03-29 ENCOUNTER — Ambulatory Visit (INDEPENDENT_AMBULATORY_CARE_PROVIDER_SITE_OTHER): Payer: 59 | Admitting: Family Medicine

## 2013-03-29 VITALS — BP 145/76 | HR 74 | Temp 98.7°F | Wt 176.0 lb

## 2013-03-29 DIAGNOSIS — I10 Essential (primary) hypertension: Secondary | ICD-10-CM

## 2013-03-29 DIAGNOSIS — E119 Type 2 diabetes mellitus without complications: Secondary | ICD-10-CM

## 2013-03-29 LAB — BASIC METABOLIC PANEL WITH GFR
BUN: 14 mg/dL (ref 6–23)
CO2: 26 mEq/L (ref 19–32)
Calcium: 9.6 mg/dL (ref 8.4–10.5)
Chloride: 101 mEq/L (ref 96–112)
Creat: 0.57 mg/dL (ref 0.50–1.10)
GFR, Est African American: >89 mL/min
GFR, Est Non African American: >89 mL/min
Glucose, Bld: 109 mg/dL — ABNORMAL HIGH (ref 70–99)
Potassium: 3.6 mEq/L (ref 3.5–5.3)
Sodium: 137 mEq/L (ref 135–145)

## 2013-03-29 LAB — CBC
HCT: 37.9 % (ref 36.0–46.0)
Hemoglobin: 13.1 g/dL (ref 12.0–15.0)
MCH: 30.2 pg (ref 26.0–34.0)
MCHC: 34.6 g/dL (ref 30.0–36.0)
MCV: 87.3 fL (ref 78.0–100.0)
Platelets: 294 10*3/uL (ref 150–400)
RBC: 4.34 MIL/uL (ref 3.87–5.11)
RDW: 13.9 % (ref 11.5–15.5)
WBC: 7.4 10*3/uL (ref 4.0–10.5)

## 2013-03-29 LAB — TSH: TSH: 1.265 u[IU]/mL (ref 0.350–4.500)

## 2013-03-29 LAB — HEPATIC FUNCTION PANEL
ALT: 22 U/L (ref 0–35)
AST: 18 U/L (ref 0–37)
Albumin: 4 g/dL (ref 3.5–5.2)
Alkaline Phosphatase: 83 U/L (ref 39–117)
Bilirubin, Direct: 0.1 mg/dL (ref 0.0–0.3)
Indirect Bilirubin: 0.2 mg/dL (ref 0.2–1.2)
Total Bilirubin: 0.3 mg/dL (ref 0.2–1.2)
Total Protein: 7 g/dL (ref 6.0–8.3)

## 2013-03-29 LAB — IPS PAP TEST WITH HPV

## 2013-03-29 MED ORDER — LOSARTAN POTASSIUM-HCTZ 100-25 MG PO TABS: 1.0000 | ORAL_TABLET | Freq: Every day | ORAL | Status: DC

## 2013-03-29 NOTE — Progress Notes (Signed)
   Subjective:    Patient ID: Maria Nunez, female    DOB: March 05, 1960, 53 y.o.   MRN: [id number]  Diabetes   Diabetes - no hypoglycemic events. No wounds or sores that are not healing well. No increased thirst or urination. Checking glucose at home. Taking medications as prescribed without any side effects. Joined a gym.    Hypertension- Pt denies chest pain, SOB, dizziness, or heart palpitations.  Taking meds as directed w/o problems.  Denies medication side effects.  BP was high at work the other day in the 150s. Feels stress is high right now. She feels work is crazy.    Review of Systems     Objective:   Physical Exam  Constitutional: She is oriented to person, place, and time. She appears well-developed and well-nourished.  HENT:  Head: Normocephalic and atraumatic.  Cardiovascular: Normal rate, regular rhythm and normal heart sounds.   Pulmonary/Chest: Effort normal and breath sounds normal.  Neurological: She is alert and oriented to person, place, and time.  Skin: Skin is warm and dry.  Psychiatric: She has a normal mood and affect. Her behavior is normal.          Assessment & Plan:  DM-   On statin and ARB.  A1C is 6.5.  Well controlled.  Followup in 3 months. Continue current regimen.  HTN- Uncontrolled today. Will increase HCTZ to 25mg  daily.  Followup in 3 months. Call if any problems or side effects with the increased dose.

## 2013-03-30 ENCOUNTER — Other Ambulatory Visit: Payer: Self-pay

## 2013-03-30 DIAGNOSIS — Z1231 Encounter for screening mammogram for malignant neoplasm of breast: Secondary | ICD-10-CM

## 2013-04-03 ENCOUNTER — Telehealth: Payer: Self-pay | Admitting: Obstetrics and Gynecology

## 2013-04-03 NOTE — Telephone Encounter (Signed)
Left message that I will leave test results in My Chart. Patient will need attempt to do colposcopy after taking Cytotec to open cervix or will need to proceed with LEEP.  Has previously had unsatisfactory colposcopy and did not proceed with recommended LEEP.

## 2013-04-05 ENCOUNTER — Ambulatory Visit (INDEPENDENT_AMBULATORY_CARE_PROVIDER_SITE_OTHER): Payer: 59 | Admitting: Sports Medicine

## 2013-04-05 ENCOUNTER — Encounter: Payer: Self-pay | Admitting: Sports Medicine

## 2013-04-05 ENCOUNTER — Telehealth: Payer: Self-pay | Admitting: Obstetrics and Gynecology

## 2013-04-05 VITALS — BP 151/78 | HR 88 | Ht 59.0 in | Wt 172.0 lb

## 2013-04-05 DIAGNOSIS — IMO0002 Reserved for concepts with insufficient information to code with codable children: Secondary | ICD-10-CM

## 2013-04-05 DIAGNOSIS — M7710 Lateral epicondylitis, unspecified elbow: Secondary | ICD-10-CM

## 2013-04-05 DIAGNOSIS — G5603 Carpal tunnel syndrome, bilateral upper limbs: Secondary | ICD-10-CM | POA: Insufficient documentation

## 2013-04-05 DIAGNOSIS — G5600 Carpal tunnel syndrome, unspecified upper limb: Secondary | ICD-10-CM

## 2013-04-05 DIAGNOSIS — M7712 Lateral epicondylitis, left elbow: Secondary | ICD-10-CM | POA: Insufficient documentation

## 2013-04-05 NOTE — Telephone Encounter (Signed)
Maria Nunez requested that Dr. Quincy Simmonds telephone her - no details given.

## 2013-04-05 NOTE — Assessment & Plan Note (Signed)
Common extensor tendon origin injection as above. Tennis elbow brace as she already has, home rehabilitation, over-the-counter NSAIDs. Return to see me in one month.

## 2013-04-05 NOTE — Assessment & Plan Note (Signed)
I have advised nighttime extension splinting for at least a month before we consider any form of interventional treatment. Return in one month for this.

## 2013-04-05 NOTE — Progress Notes (Signed)
   Subjective:    I'm seeing this patient as a consultation for:  Dr. Madilyn Fireman  CC: Left elbow pain  HPI: This is a very pleasant 53 year old female, she comes in with a several month history of pain that she localizes the common extensor tendon origin, and over the entire common extensor tendon to the midforearm. Moderate, persistent, difficult to grasp objects. No radicular symptoms, no numbness or tingling. She has been using the tennis elbow brace but has not been doing any rehabilitation exercises.  Past medical history, Surgical history, Family history not pertinant except as noted below, Social history, Allergies, and medications have been entered into the medical record, reviewed, and no changes needed.   Review of Systems: No headache, visual changes, nausea, vomiting, diarrhea, constipation, dizziness, abdominal pain, skin rash, fevers, chills, night sweats, weight loss, swollen lymph nodes, body aches, joint swelling, muscle aches, chest pain, shortness of breath, mood changes, visual or auditory hallucinations.   Objective:   General: Well Developed, well nourished, and in no acute distress.  Neuro/Psych: Alert and oriented x3, extra-ocular muscles intact, able to move all 4 extremities, sensation grossly intact. Skin: Warm and dry, no rashes noted.  Respiratory: Not using accessory muscles, speaking in full sentences, trachea midline.  Cardiovascular: Pulses palpable, no extremity edema. Abdomen: Does not appear distended. Left Elbow: Unremarkable to inspection. Range of motion full pronation, supination, flexion, extension. Strength is full to all of the above directions Stable to varus, valgus stress. Negative moving valgus stress test. Tender to palpation of the common extensor tendon origin, reduction of pain with resisted extension of the wrist.   Procedure: Real-time Ultrasound Guided Injection of left common extensor tendon origin Device: GE Logiq E  Verbal informed  consent obtained.  Time-out conducted.  Noted no overlying erythema, induration, or other signs of local infection.  Skin prepped in a sterile fashion.  Local anesthesia: Topical Ethyl chloride.  With sterile technique and under real time ultrasound guidance:  A 25-gauge needle advanced just deep to the common extensor tendon, 1 cc kenalog 40, 3 cc lidocaine injected easily. Completed without difficulty  Pain immediately resolved suggesting accurate placement of the medication.  Advised to call if fevers/chills, erythema, induration, drainage, or persistent bleeding.  Images permanently stored and available for review in the ultrasound unit.  Impression: Technically successful ultrasound guided injection.  Impression and Recommendations:   This case required medical decision making of moderate complexity.

## 2013-04-06 NOTE — Telephone Encounter (Signed)
Phone call to discuss pap results showing LGSIL, bacterial vaginosis, anucleated cells, and positive high risk HPV.  Recommendation to proceed with treatment, LEEP, due to continued abnormal pap and prior unsatisfactory colposcopy.  Patient had a barrier to doing the LEEP some months ago, but will proceed now.   I will put in an order for this now.

## 2013-04-06 NOTE — Telephone Encounter (Signed)
Phone call returned to patient. No answer. I left a message that I was returning her call.

## 2013-04-06 NOTE — Telephone Encounter (Signed)
Patient returning Dr. Elza Rafter call.

## 2013-04-07 ENCOUNTER — Telehealth: Payer: Self-pay | Admitting: *Deleted

## 2013-04-07 NOTE — Telephone Encounter (Signed)
Call to patient ad LEEP scheduled for 05-04-13.

## 2013-04-07 NOTE — Telephone Encounter (Signed)
Message copied by Jaymes Graff on Fri Apr 07, 2013  2:18 PM ------      Message from: [place], [place]: Mon Apr 03, 2013  6:38 PM       Gay Filler,             Please contact the patient to coordinate a colposcopy with me.      I would like to pretreat the patient with Cytotec 200 mcg the evening prior to and the morning of the procedure.             This is the patient that we tried to schedule a LEEP for, but was not done for various reasons on the part of the patient.             Maria Nunez. ------

## 2013-04-11 NOTE — Telephone Encounter (Signed)
Pt returning call. Will be going to lunch at 12.

## 2013-04-11 NOTE — Telephone Encounter (Signed)
Voicemail confirmed patient identity. Left message advising that PR for LEEP is $156.61. Asked patient to call me back to discuss.

## 2013-04-11 NOTE — Telephone Encounter (Signed)
Ok to call her work number  [phone].

## 2013-04-11 NOTE — Telephone Encounter (Signed)
Message copied by Laqueta Due on Tue Apr 11, 2013  9:51 AM ------      Message from: Jaymes Graff      Created: Fri Apr 07, 2013  3:11 PM      Regarding: RE: leep       That doest seem so bad.  Lets see what she says.  If she needs it, can do in half.      ----- Message -----         From: Laqueta Due         Sent: 04/07/2013   3:05 PM           To: Huey Romans, RN      Subject: RE: leep                                                 PR: $[id number]      ----- Message -----         From: Huey Romans, RN         Sent: 04/07/2013   2:25 PM           To: Laqueta Due      Subject: leep                                                     Patient is [employer] employee.  Has declined to schedule this previously due to $$$.  We might be able to work with her....             ------

## 2013-04-13 NOTE — Telephone Encounter (Signed)
Contacted patient and scheduled LEEP for 05-04-13.

## 2013-04-18 NOTE — Telephone Encounter (Signed)
LEEP is scheduled for 05-04-13.

## 2013-04-18 NOTE — Telephone Encounter (Signed)
Spoke to patient and procedure scheduled. Routing to provider for final review. Patient agreeable to disposition. Will close encounter

## 2013-05-03 ENCOUNTER — Ambulatory Visit: Payer: 59 | Admitting: Sports Medicine

## 2013-05-04 ENCOUNTER — Ambulatory Visit: Payer: 59 | Admitting: Obstetrics and Gynecology

## 2013-05-04 ENCOUNTER — Encounter: Payer: Self-pay | Admitting: Obstetrics and Gynecology

## 2013-05-04 ENCOUNTER — Ambulatory Visit (INDEPENDENT_AMBULATORY_CARE_PROVIDER_SITE_OTHER): Payer: 59 | Admitting: Obstetrics and Gynecology

## 2013-05-04 VITALS — BP 120/78 | HR 88 | Ht 59.5 in | Wt 176.4 lb

## 2013-05-04 DIAGNOSIS — R6889 Other general symptoms and signs: Secondary | ICD-10-CM

## 2013-05-04 DIAGNOSIS — IMO0002 Reserved for concepts with insufficient information to code with codable children: Secondary | ICD-10-CM

## 2013-05-04 HISTORY — PX: LEEP: SHX91

## 2013-05-04 NOTE — Progress Notes (Signed)
Patient ID: Maria Nunez, female   DOB: 11-23-1960, 53 y.o.   MRN: [id number]  52 y.o. DivorcedNot Hispanic or Latino@GENDER @  OB History   Grav Para Term Preterm Abortions TAB SAB Ect Mult Living   3 2 2  1  1   2      here for LEEP.    Pap History:low-grade squamous intraepithelial neoplasia (LGSIL - encompassing HPV,mild dysplasia,CIN I) and positive high risk HPV on Pap done  03/24/13.  Patient's last prior pap on 06/16/12 was atypia with positive high risk HPV.  Colposcopy on 07/14/12 was unsatisfactory and no lesions were seen. LEEP procedure was planned but the patient had family issues and was not able to proceed until now.    No LMP recorded. Patient has had a hysterectomy.  - supracervical.    supracervical hysterctomy  Pre-procedure vitals: Blood pressure 120/78, pulse 88, height 4' 11.5" (1.511 m), weight 176 lb 6.4 oz (80.015 kg).   Procedure explained and patient's questions were invited and answered.   Consent form signed.  Procedure Set-up: Grounding pad located leftthigh.  Cautery settings:60 cut/60coagulation.  Suction applied to coated speculum.  Procedure:  Speculum placed with good visualization of the cervix. Cervix stenotic appearing.  Cervix sterily prepped with Hibiclens.  Lugol's placed.  Cervix anesthetized using 2% Xylocaine with 1:100,000units Epinephrine.   18 cc's used. Entire transition zone with   loop in one pass to the exocervix .  Specimen(s) placed on cork and labeled at 12 o'clock with a pink pin for pathology.  Second pass of the endocervical button.  Specimen placed on cork and labeled at 12 o'clock with a green pin for pathology.   Hemostasis obtained with ball cautery and Monsel's solution.  EBL:  less than 50 mL  Complications:none  Patient tolerated procedure well and left the office in satisfactory condition.  Plan:    Contact patient with path results. Return for a recheck in one months.  Precautions given.   After visit summary given.

## 2013-05-05 ENCOUNTER — Encounter: Payer: Self-pay | Admitting: Obstetrics and Gynecology

## 2013-05-05 NOTE — Telephone Encounter (Signed)
Patient has had a headache for two days and her blood pressure was 188/102. Patient took her medication and her pressure is now 178/102. Patient still has a headache and used afrin nasal spray. Patient has an appointment for her aex  Today at 2:00 with Dr.Silva. Patient is wondering if she needs to keep this appointment. Patient does not feel as if she can drive at the moment. Please call.

## 2013-05-08 ENCOUNTER — Telehealth: Payer: Self-pay | Admitting: *Deleted

## 2013-05-08 NOTE — Telephone Encounter (Signed)
Opened in duplication. No call from patient.

## 2013-05-08 NOTE — Telephone Encounter (Signed)
This encounter was created in error - please disregard.

## 2013-05-09 ENCOUNTER — Encounter: Payer: Self-pay | Admitting: Obstetrics and Gynecology

## 2013-05-09 LAB — IPS OTHER TISSUE BIOPSY

## 2013-05-10 ENCOUNTER — Telehealth: Payer: Self-pay | Admitting: Obstetrics and Gynecology

## 2013-05-10 ENCOUNTER — Encounter: Payer: Self-pay | Admitting: Obstetrics and Gynecology

## 2013-05-10 ENCOUNTER — Ambulatory Visit (INDEPENDENT_AMBULATORY_CARE_PROVIDER_SITE_OTHER): Payer: 59 | Admitting: Obstetrics and Gynecology

## 2013-05-10 VITALS — BP 122/82 | HR 80 | Ht 59.5 in | Wt 176.0 lb

## 2013-05-10 DIAGNOSIS — B9689 Other specified bacterial agents as the cause of diseases classified elsewhere: Secondary | ICD-10-CM

## 2013-05-10 DIAGNOSIS — N760 Acute vaginitis: Secondary | ICD-10-CM

## 2013-05-10 DIAGNOSIS — A499 Bacterial infection, unspecified: Secondary | ICD-10-CM

## 2013-05-10 MED ORDER — METRONIDAZOLE 500 MG PO TABS: 500.0000 mg | ORAL_TABLET | Freq: Two times a day (BID) | ORAL | Status: DC

## 2013-05-10 NOTE — Patient Instructions (Signed)
Metronidazole extended-release tablets What is this medicine? METRONIDAZOLE (me troe NI da zole) is an antiinfective. It is used to treat certain kinds of bacterial and protozoal infections. It will not work for colds, flu, or other viral infections. This medicine may be used for other purposes; ask your health care provider or pharmacist if you have questions. COMMON BRAND NAME(S): Flagyl ER What should I tell my health care provider before I take this medicine? They need to know if you have any of these conditions: -anemia or other blood disorders -disease of the nervous system -fungal or yeast infection -if you drink alcohol containing drinks -liver disease -seizures -an unusual or allergic reaction to metronidazole, or other medicines, foods, dyes, or preservatives -pregnant or trying to get pregnant -breast-feeding How should I use this medicine? Take this medicine by mouth with a full glass of water. Follow the directions on the prescription label. Do not crush or chew. Take this medicine on an empty stomach 1 hour before or 2 hours after meals or food. Take your medicine at regular intervals. Do not take your medicine more often than directed. Take all of your medicine as directed even if you think you are better. Do not skip doses or stop your medicine early. Talk to your pediatrician regarding the use of this medicine in children. Special care may be needed. Overdosage: If you think you have taken too much of this medicine contact a poison control center or emergency room at once. NOTE: This medicine is only for you. Do not share this medicine with others. What if I miss a dose? If you miss a dose, take it as soon as you can. If it is almost time for your next dose, take only that dose. Do not take double or extra doses. What may interact with this medicine? Do not take this medicine with any of the following medications: -alcohol or any product that contains alcohol -amprenavir  oral solution -cisapride -disulfiram -dofetilide -dronedarone -paclitaxel injection -pimozide -ritonavir oral solution -sertraline oral solution -sulfamethoxazole-trimethoprim injection -thioridazine -ziprasidone This medicine may also interact with the following medications: -cimetidine -lithium -other medicines that prolong the QT interval (cause an abnormal heart rhythm) -phenobarbital -phenytoin -warfarin This list may not describe all possible interactions. Give your health care provider a list of all the medicines, herbs, non-prescription drugs, or dietary supplements you use. Also tell them if you smoke, drink alcohol, or use illegal drugs. Some items may interact with your medicine. What should I watch for while using this medicine? Tell your doctor or health care professional if your symptoms do not improve or if they get worse. You may get drowsy or dizzy. Do not drive, use machinery, or do anything that needs mental alertness until you know how this medicine affects you. Do not stand or sit up quickly, especially if you are an older patient. This reduces the risk of dizzy or fainting spells. Avoid alcoholic drinks while you are taking this medicine and for three days afterward. Alcohol may make you feel dizzy, sick, or flushed. If you are being treated for a sexually transmitted disease, avoid sexual contact until you have finished your treatment. Your sexual partner may also need treatment. What side effects may I notice from receiving this medicine? Side effects that you should report to your doctor or health care professional as soon as possible: -allergic reactions like skin rash, itching or hives, swelling of the face, lips, or tongue -confusion, clumsiness -difficulty speaking -discolored or sore mouth -dizziness -fever,  infection -numbness, tingling, pain or weakness in the hands or feet -trouble passing urine or change in the amount of urine -redness, blistering,  peeling or loosening of the skin, including inside the mouth -seizures -unusually weak or tired -vaginal irritation, dryness, or discharge Side effects that usually do not require medical attention (report to your doctor or health care professional if they continue or are bothersome): -diarrhea -headache -irritability -metallic taste -nausea -stomach pain or cramps -trouble sleeping This list may not describe all possible side effects. Call your doctor for medical advice about side effects. You may report side effects to FDA at 1-800-FDA-1088. Where should I keep my medicine? Keep out of the reach of children. Store at room temperature between 15 and 30 degrees C (59 and 86 degrees F). Protect from light and moisture. Keep container tightly closed. Throw away any unused medicine after the expiration date. NOTE: This sheet is a summary. It may not cover all possible information. If you have questions about this medicine, talk to your doctor, pharmacist, or health care provider.  2014, Elsevier/Gold Standard. (2012-07-12 13:10:32)

## 2013-05-10 NOTE — Telephone Encounter (Signed)
Patient says she received a message through MyChart that Dr.Silva wanted to see her today for a reck. No appointments available today.

## 2013-05-10 NOTE — Telephone Encounter (Signed)
Office visit scheduled for 1215 with Dr. Quincy Simmonds, patient agreeable.  Patient did not read mychart message until 0830 after 0730 offered appointment has passed.  Routing to provider for final review. Patient agreeable to disposition. Will close encounter

## 2013-05-10 NOTE — Progress Notes (Signed)
Patient ID: Maria Nunez, female   DOB: November 18, 1960, 53 y.o.   MRN: [id number] GYNECOLOGY VISIT  PCP:   Beatrice Lecher, MD  Referring provider:   HPI: 53 y.o.   Divorced  Caucasian  female   [phone] with No LMP recorded. Patient has had a hysterectomy.   here for vaginal discharge with odor status post 6 days LEEP procedure.   Pathology showed HPV infection, no dysplasia and no malignancy.   Notes dark discharge and some odor.  No fever.  No bleeding following the procedure.  Pad change two - three times a day.    GYNECOLOGIC HISTORY: No LMP recorded. Patient has had a hysterectomy. Sexually active:  no Partner preference: female Contraception:  Supracervical hysterectomy  Menopausal hormone therapy: no DES exposure: no   Blood transfusions:   no Sexually transmitted diseases:  HPV  GYN procedures and prior surgeries: C-section, Supracervical hysterectomy.  Last mammogram:  05-20-12 wnl:[facility]               Last pap and high risk HPV testing: 03-24-13 LSIL:pos. HR HPV, 06-16-12 ascus:pos. HR HPV.  Colposcopy 07-14-12 was unsatisfactory and no lesions were seen.  LEEP procedure was planned but patient had family issues and was not able to proceed until 05-04-13.    History of abnormal pap smear:     OB History   Grav Para Term Preterm Abortions TAB SAB Ect Mult Living   3 2 2  1  1   2            Patient Active Problem List   Diagnosis Date Noted  . Left lateral epicondylitis 04/05/2013  . Carpal tunnel syndrome, bilateral 04/05/2013  . Tennis elbow 03/24/2013  . LGSIL (low grade squamous intraepithelial dysplasia) 06/16/2012  . Plantar fasciosis 11/26/2011  . Type II or unspecified type diabetes mellitus without mention of complication, not stated as uncontrolled 07/02/2009  . PANIC ATTACK 09/07/2008  . HYPERTENSION 04/27/2008  . RESTLESS LEG SYNDROME 04/02/2008  . POLYARTHRITIS 04/02/2008  . KNEE PAIN, LEFT 01/02/2008  . HOT FLASHES 08/04/2006  .  HYPERCHOLESTEROLEMIA 11/03/2005  . HYPERTENSION, BENIGN SYSTEMIC 11/03/2005    Past Medical History  Diagnosis Date  . Hypertension   . Allergy   . Hidradenitis   . Diabetes mellitus     borderline  . Fibroid   . Heart murmur     Past Surgical History  Procedure Laterality Date  . Cesarean section    . Inguinal hidradenitis excision    . Breast surgery      cysts removed right breast  . Abdominal hysterectomy      supracervical    Current Outpatient Prescriptions  Medication Sig Dispense Refill  . aspirin 81 MG tablet Take 81 mg by mouth daily.        . fluticasone (FLONASE) 50 MCG/ACT nasal spray Place 2 sprays into both nostrils daily as needed for allergies or rhinitis.      [name] FOLIC ACID PO Take 509 mcg by mouth daily.       Elmore Guise Device MISC        . losartan-hydrochlorothiazide (HYZAAR) 100-25 MG per tablet Take 1 tablet by mouth daily.  90 tablet  3  . metFORMIN (GLUCOPHAGE XR) 500 MG 24 hr tablet Take 1 tablet (500 mg total) by mouth daily with breakfast.  90 tablet  3  . Multiple Vitamin (MULTIVITAMIN) tablet Take 1 tablet by mouth daily.        [name]  Omega-3 Fatty Acids (FISH OIL) 1000 MG CAPS Take 1,000 mg by mouth daily. 4 tabs      . pravastatin (PRAVACHOL) 40 MG tablet TAKE 1 TABLET BY MOUTH DAILY AT BEDTIME.  90 tablet  1  . TRUETEST TEST test strip TEST BLOOD SUGAR TWICE DAILY  200 each  2  . vitamin B-12 (CYANOCOBALAMIN) 100 MCG tablet Take 50 mcg by mouth daily.        . Zinc Sulfate (ZINC 15 PO) Take by mouth.         No current facility-administered medications for this visit.     ALLERGIES: Cefaclor; Hycodan; Penicillins; Sulfonamide derivatives; and Tetracycline  Family History  Problem Relation Age of Onset  . Asthma Mother   . COPD Mother   . Diabetes Mother   . Hypertension Mother   . Diabetes Father   . Hypertension Father   . Breast cancer Maternal Aunt   . Breast cancer Paternal Aunt     History   Social History  . Marital Status:  Divorced    Spouse Name: N/A    Number of Children: N/A  . Years of Education: N/A   Occupational History  . Not on file.   Social History Main Topics  . Smoking status: Never Smoker   . Smokeless tobacco: Never Used  . Alcohol Use: No  . Drug Use: No  . Sexual Activity: No     Comment: TAH/supracervical   Other Topics Concern  . Not on file   Social History Narrative  . No narrative on file    ROS:  Pertinent items are noted in HPI.  PHYSICAL EXAMINATION:    BP 122/82  Pulse 80  Ht 4' 11.5" (1.511 m)  Wt 176 lb (79.833 kg)  BMI 34.97 kg/m2   Wt Readings from Last 3 Encounters:  05/10/13 176 lb (79.833 kg)  05/04/13 176 lb 6.4 oz (80.015 kg)  04/05/13 172 lb (78.019 kg)     Ht Readings from Last 3 Encounters:  05/10/13 4' 11.5" (1.511 m)  05/04/13 4' 11.5" (1.511 m)  04/05/13 4\' 11"  (1.499 m)    General appearance: alert, cooperative and appears stated age  Neurologic: Grossly normal  Pelvic: External genitalia:  no lesions              Urethra:  normal appearing urethra with no masses, tenderness or lesions              Bartholins and Skenes: normal                 Vagina: normal appearing vagina with normal color and discharge, no lesions.  Odor noted.  No blood.              Cervix:  Consistent with LEEP.  Black thin weeping drainage noted.  Cautery effect of the cervix noted.  No blood. No erythema of the cervix.  No CMT.                  Bimanual Exam:  Uterus:   absent                                      Adnexa:  no masses, nontender.  ASSESSMENT  History of supracervical hysterectomy.  Status post LEEP with HPV infection noted on final pathology. Bacterial vaginosis.  Allergy to tetracycline.   PLAN  Will give a course of Flagyl 500 mg po bid for one week. Recheck in one week.  Next pap in one year.  An After Visit Summary was printed and given to the patient.  15 minutes face to face time discussing  pathology results, paps, vagintis, and follow up of LEEP.  After visit summary to patient.

## 2013-05-11 ENCOUNTER — Telehealth: Payer: Self-pay

## 2013-05-11 NOTE — Telephone Encounter (Signed)
Entered 08 pap recall

## 2013-05-11 NOTE — Telephone Encounter (Signed)
Message copied by Lowella Fairy on Thu May 11, 2013  1:35 PM ------      Message from: [place], [place]: Wed May 10, 2013  1:34 PM      Regarding: next pap in one year.       Please place in recall for pap in one year.             Thanks. ------

## 2013-05-16 ENCOUNTER — Ambulatory Visit: Payer: 59 | Admitting: Sports Medicine

## 2013-05-18 ENCOUNTER — Ambulatory Visit (INDEPENDENT_AMBULATORY_CARE_PROVIDER_SITE_OTHER): Payer: 59 | Admitting: Obstetrics and Gynecology

## 2013-05-18 ENCOUNTER — Encounter: Payer: Self-pay | Admitting: Obstetrics and Gynecology

## 2013-05-18 VITALS — BP 124/82 | HR 80 | Ht 59.5 in | Wt 176.2 lb

## 2013-05-18 DIAGNOSIS — N764 Abscess of vulva: Secondary | ICD-10-CM

## 2013-05-18 MED ORDER — DOXYCYCLINE HYCLATE 100 MG PO CAPS: 100.0000 mg | ORAL_CAPSULE | Freq: Two times a day (BID) | ORAL | Status: DC

## 2013-05-18 NOTE — Patient Instructions (Signed)
Take the Doxycycline 100 mg by mouth twice a day for one week. Take with food to avoid gastrointestinal upset. Avoid direct sunlight as this may cause you to had a bad sunburn!

## 2013-05-18 NOTE — Progress Notes (Signed)
Patient ID: Maria Nunez, female   DOB: 11-02-1960, 53 y.o.   MRN: [id number] GYNECOLOGY  VISIT   HPI: 53 y.o.   Divorced  Caucasian  female   [phone] with No LMP recorded. Patient has had a hysterectomy.   here for  1 week follow up.  Patient is status post LEEP on 05/04/13.  Final pathology showed HPV effect.  No high grade dysplasia or malignancy. ECC benign.   Treated for bacterial vaginosis on 05/10/13 with Flagyl. Having yellowish drainage.   History of hidradinitis.  Can take doxycycline.  Allergy to tetracycline.  Thinks she also had a touch of the flu.   GYNECOLOGIC HISTORY: No LMP recorded. Patient has had a hysterectomy. Contraception:  Supracervical Hysterectomy  Menopausal hormone therapy: none        OB History   Grav Para Term Preterm Abortions TAB SAB Ect Mult Living   3 2 2  1  1   2          Patient Active Problem List   Diagnosis Date Noted  . Left lateral epicondylitis 04/05/2013  . Carpal tunnel syndrome, bilateral 04/05/2013  . Tennis elbow 03/24/2013  . LGSIL (low grade squamous intraepithelial dysplasia) 06/16/2012  . Plantar fasciosis 11/26/2011  . Type II or unspecified type diabetes mellitus without mention of complication, not stated as uncontrolled 07/02/2009  . PANIC ATTACK 09/07/2008  . HYPERTENSION 04/27/2008  . RESTLESS LEG SYNDROME 04/02/2008  . POLYARTHRITIS 04/02/2008  . KNEE PAIN, LEFT 01/02/2008  . HOT FLASHES 08/04/2006  . HYPERCHOLESTEROLEMIA 11/03/2005  . HYPERTENSION, BENIGN SYSTEMIC 11/03/2005    Past Medical History  Diagnosis Date  . Hypertension   . Allergy   . Hidradenitis   . Diabetes mellitus     borderline  . Fibroid   . Heart murmur     Past Surgical History  Procedure Laterality Date  . Cesarean section    . Inguinal hidradenitis excision    . Breast surgery      cysts removed right breast  . Abdominal hysterectomy      supracervical    Current Outpatient Prescriptions  Medication Sig Dispense  Refill  . aspirin 81 MG tablet Take 81 mg by mouth daily.        . fluticasone (FLONASE) 50 MCG/ACT nasal spray Place 2 sprays into both nostrils daily as needed for allergies or rhinitis.      [name] FOLIC ACID PO Take 355 mcg by mouth daily.       Elmore Guise Device MISC        . losartan-hydrochlorothiazide (HYZAAR) 100-25 MG per tablet Take 1 tablet by mouth daily.  90 tablet  3  . metFORMIN (GLUCOPHAGE XR) 500 MG 24 hr tablet Take 1 tablet (500 mg total) by mouth daily with breakfast.  90 tablet  3  . metroNIDAZOLE (FLAGYL) 500 MG tablet Take 1 tablet (500 mg total) by mouth 2 (two) times daily.  14 tablet  0  . Multiple Vitamin (MULTIVITAMIN) tablet Take 1 tablet by mouth daily.        . Omega-3 Fatty Acids (FISH OIL) 1000 MG CAPS Take 1,000 mg by mouth daily. 4 tabs      . pravastatin (PRAVACHOL) 40 MG tablet TAKE 1 TABLET BY MOUTH DAILY AT BEDTIME.  90 tablet  1  . TRUETEST TEST test strip TEST BLOOD SUGAR TWICE DAILY  200 each  2  . vitamin B-12 (CYANOCOBALAMIN) 100 MCG tablet Take 50 mcg by mouth daily.        [name]  Zinc Sulfate (ZINC 15 PO) Take by mouth.         No current facility-administered medications for this visit.     ALLERGIES: Cefaclor; Hycodan; Penicillins; Sulfonamide derivatives; and Tetracycline  Family History  Problem Relation Age of Onset  . Asthma Mother   . COPD Mother   . Diabetes Mother   . Hypertension Mother   . Diabetes Father   . Hypertension Father   . Breast cancer Maternal Aunt   . Breast cancer Paternal Aunt     History   Social History  . Marital Status: Divorced    Spouse Name: N/A    Number of Children: N/A  . Years of Education: N/A   Occupational History  . Not on file.   Social History Main Topics  . Smoking status: Never Smoker   . Smokeless tobacco: Never Used  . Alcohol Use: No  . Drug Use: No  . Sexual Activity: No     Comment: TAH/supracervical   Other Topics Concern  . Not on file   Social History Narrative  . No  narrative on file    ROS:  Pertinent items are noted in HPI.  PHYSICAL EXAMINATION:    BP 124/82  Pulse 80  Ht 4' 11.5" (1.511 m)  Wt 176 lb 3.2 oz (79.924 kg)  BMI 35.01 kg/m2     General appearance: alert, cooperative and appears stated age   Pelvic: External genitalia:  Right mons pubis with 1.0 cm indurated tender area with central opening.  No fluid espressed.              Urethra:  normal appearing urethra with no masses, tenderness or lesions              Bartholins and Skenes: normal                 Vagina: normal appearing vagina with normal color and discharge, no lesions              Cervix: consistent with LEEP.  Yellowish drainage.  No odor.  No blood. No erythema.                    Bimanual Exam:  Uterus:  Absent.                                      Adnexa: normal adnexa in size, nontender and no masses                                        ASSESSMENT  Right vulvar abscess. Status post LEEP.    PLAN  Doxycycline 100 mg po bid for one week.   Precautions given.  Would culture to rule out MRSA. Call for increasing size of wound or fever.  OK to return to the gym after LEEP. Follow up in 2 - 3 weeks for final check on healing and would recheck.   An After Visit Summary was printed and given to the patient.  __15____ minutes face to face time of which over 50% was spent in counseling.

## 2013-05-21 LAB — WOUND CULTURE
Gram Stain: NONE SEEN
Gram Stain: NONE SEEN
Gram Stain: NONE SEEN
Organism ID, Bacteria: NO GROWTH

## 2013-05-22 ENCOUNTER — Ambulatory Visit: Payer: 59

## 2013-06-01 ENCOUNTER — Encounter: Payer: Self-pay | Admitting: Obstetrics and Gynecology

## 2013-06-01 ENCOUNTER — Ambulatory Visit: Payer: 59 | Admitting: Obstetrics and Gynecology

## 2013-06-05 ENCOUNTER — Ambulatory Visit
Admission: RE | Admit: 2013-06-05 | Discharge: 2013-06-05 | Disposition: A | Discharge status: Home / Self Care | Payer: 59 | Source: Ambulatory Visit

## 2013-06-05 DIAGNOSIS — Z1231 Encounter for screening mammogram for malignant neoplasm of breast: Secondary | ICD-10-CM

## 2013-06-22 ENCOUNTER — Encounter: Payer: Self-pay | Admitting: Obstetrics and Gynecology

## 2013-06-22 ENCOUNTER — Ambulatory Visit (INDEPENDENT_AMBULATORY_CARE_PROVIDER_SITE_OTHER): Payer: 59 | Admitting: Obstetrics and Gynecology

## 2013-06-22 VITALS — BP 122/78 | HR 88 | Ht 59.75 in | Wt 174.2 lb

## 2013-06-22 DIAGNOSIS — L0390 Cellulitis, unspecified: Secondary | ICD-10-CM

## 2013-06-22 DIAGNOSIS — L0291 Cutaneous abscess, unspecified: Secondary | ICD-10-CM | POA: Insufficient documentation

## 2013-06-22 NOTE — Progress Notes (Signed)
Patient ID: Maria Nunez, female   DOB: 1960/11/04, 53 y.o.   MRN: [id number] GYNECOLOGY VISIT  PCP:   Beatrice Lecher, MD  Referring provider:   HPI: 53 y.o.   Divorced  Caucasian  female   [phone] with No LMP recorded. Patient has had a hysterectomy.   here for AEX.  Ovaries remain.   Status post LEEP on 05/04/13 showing HPV effect and no dysplasia.  Done for unsatisfactory colposcopy with pap with LGSIL and positive HR HPV.  Long history of abnormal paps with no moderate or high grade dysplasia and no prior treatment until April 2015.  Patient had been declining removal of the cervix.   Treated with Doxycycline for mons pubis abscess on 05/18/13.  Wound culture was benign.  Cleared up quickly after using antibiotic.  New area popped up on her right thigh. Taking Doxycycline that she had at home.  Feeling better already.  Thinks it is related to going to the gym.   Now wiping down prior to and after using machines.   Hgb:   PCP Urine:  Unable to void  GYNECOLOGIC HISTORY: No LMP recorded. Patient has had a hysterectomy. Sexually active:  no Partner preference: female Contraception:  Supracervical hysterectomy.  Ovaries remain.  Menopausal hormone therapy: no DES exposure:  no  Blood transfusions: no   Sexually transmitted diseases:  HPV GYN procedures and prior surgeries: Hx of colposcopy/LEEP procedure 05-04-13. Last mammogram:  06-05-13 wnl:[facility]              Last pap and high risk HPV testing:  03-24-13 LSIL :Pos. HR HPV. History of abnormal pap smear:  Hx of abnormal paps since hysterectomy. Colposcopy and LEEP procedure 04/2013 revealing HPV infection, no dysplasia or malignancy.   OB History   Grav Para Term Preterm Abortions TAB SAB Ect Mult Living   3 2 2  1  1   2        LIFESTYLE: Exercise:  Gym work out           Tobacco:   no Alcohol:     no Drug use:  no  OTHER HEALTH MAINTENANCE: Tetanus/TDap:  Has reaction to vaccine Gardisil:               n/a Influenza:            09/2012 Zostavax:             n/a  Bone density:       n/a Colonoscopy:      Schedule with Dr. Collene Mares 07-24-13  Cholesterol check:   To have checked with PCP next week  Family History  Problem Relation Age of Onset  . Asthma Mother   . COPD Mother   . Diabetes Mother   . Hypertension Mother   . Diabetes Father   . Hypertension Father   . Breast cancer Maternal Aunt   . Breast cancer Paternal Aunt     Patient Active Problem List   Diagnosis Date Noted  . Left lateral epicondylitis 04/05/2013  . Carpal tunnel syndrome, bilateral 04/05/2013  . Tennis elbow 03/24/2013  . LGSIL (low grade squamous intraepithelial dysplasia) 06/16/2012  . Plantar fasciosis 11/26/2011  . Type II or unspecified type diabetes mellitus without mention of complication, not stated as uncontrolled 07/02/2009  . PANIC ATTACK 09/07/2008  . HYPERTENSION 04/27/2008  . RESTLESS LEG SYNDROME 04/02/2008  . POLYARTHRITIS 04/02/2008  . KNEE PAIN, LEFT 01/02/2008  . HOT FLASHES 08/04/2006  . HYPERCHOLESTEROLEMIA  11/03/2005  . HYPERTENSION, BENIGN SYSTEMIC 11/03/2005   Past Medical History  Diagnosis Date  . Hypertension   . Allergy   . Hidradenitis   . Diabetes mellitus     borderline  . Fibroid   . Heart murmur     Past Surgical History  Procedure Laterality Date  . Cesarean section    . Inguinal hidradenitis excision    . Breast surgery      cysts removed right breast  . Abdominal hysterectomy      supracervical    ALLERGIES: Cefaclor; Hycodan; Penicillins; Sulfonamide derivatives; and Tetracycline  Current Outpatient Prescriptions  Medication Sig Dispense Refill  . aspirin 81 MG tablet Take 81 mg by mouth daily.        [name] doxycycline (VIBRAMYCIN) 100 MG capsule Take 1 capsule (100 mg total) by mouth 2 (two) times daily. Take BID for 7 days.  Take with food as can cause GI distress.  14 capsule  0  . FOLIC ACID PO Take 458 mcg by mouth daily.       Elmore Guise Device  MISC        . losartan-hydrochlorothiazide (HYZAAR) 100-25 MG per tablet Take 1 tablet by mouth daily.  90 tablet  3  . metFORMIN (GLUCOPHAGE XR) 500 MG 24 hr tablet Take 1 tablet (500 mg total) by mouth daily with breakfast.  90 tablet  3  . Multiple Vitamin (MULTIVITAMIN) tablet Take 1 tablet by mouth daily.        . Omega-3 Fatty Acids (FISH OIL) 1000 MG CAPS Take 1,000 mg by mouth daily. 4 tabs      . pravastatin (PRAVACHOL) 40 MG tablet TAKE 1 TABLET BY MOUTH DAILY AT BEDTIME.  90 tablet  1  . TRUETEST TEST test strip TEST BLOOD SUGAR TWICE DAILY  200 each  2  . vitamin B-12 (CYANOCOBALAMIN) 100 MCG tablet Take 50 mcg by mouth daily.        . Zinc Sulfate (ZINC 15 PO) Take by mouth.         No current facility-administered medications for this visit.     ROS:  Pertinent items are noted in HPI.  SOCIAL HISTORY:    PHYSICAL EXAMINATION:    BP 122/78  Pulse 88  Ht 4' 11.75" (1.518 m)  Wt 174 lb 3.2 oz (79.017 kg)  BMI 34.29 kg/m2   Wt Readings from Last 3 Encounters:  06/22/13 174 lb 3.2 oz (79.017 kg)  05/18/13 176 lb 3.2 oz (79.924 kg)  05/10/13 176 lb (79.833 kg)     Ht Readings from Last 3 Encounters:  06/22/13 4' 11.75" (1.518 m)  05/18/13 4' 11.5" (1.511 m)  05/10/13 4' 11.5" (1.511 m)    General appearance: alert, cooperative and appears stated age Head: Normocephalic, without obvious abnormality, atraumatic Neck: no adenopathy, supple, symmetrical, trachea midline and thyroid not enlarged, symmetric, no tenderness/mass/nodules Lungs: clear to auscultation bilaterally Breasts: Inspection negative, No nipple retraction or dimpling, No nipple discharge or bleeding, No axillary or supraclavicular adenopathy, Normal to palpation without dominant masses Heart: regular rate and rhythm Abdomen: soft, non-tender; no masses,  no organomegaly Extremities: extremities normal, atraumatic, no cyanosis or edema Skin: Skin color, texture, turgor normal. No rashes.  Right medial  thigh abscess 1.5 - 2 cm, tender, not draining.  Lymph nodes: Cervical, supraclavicular, and axillary nodes normal. No abnormal inguinal nodes palpated Neurologic: Grossly normal  Pelvic: External genitalia:  no lesions  Urethra:  normal appearing urethra with no masses, tenderness or lesions              Bartholins and Skenes: normal                 Vagina: normal appearing vagina with normal color and discharge, no lesions              Cervix: normal appearance.  Consistent with LEEP.               Pap and high risk HPV testing done: no.            Bimanual Exam:  Uterus:  uterus is  Absent.                                      Adnexa: normal adnexa in size, nontender and no masses                                      Rectovaginal: Confirms                                      Anus:  normal sphincter tone, no lesions  ASSESSMENT  Normal gynecologic exam. Right thigh abscess - improved per patient.  Status post right mons abscess with negative MRSA culture.  Responded to doxycycline.  Status post LEEP - pathology negative for dysplasia and malignancy.   PLAN  Mammogram recommended yearly.  Pap smear and high risk HPV testing next year.  Counseled on self breast exam, Calcium and vitamin D intake, exercise. Doxycycline 100 mg po bid for 7 days.  Has Rx.  Call tomorrow if worsening so it can be drained by me.  Return annually or prn   An After Visit Summary was printed and given to the patient.

## 2013-06-22 NOTE — Patient Instructions (Signed)

## 2013-06-29 ENCOUNTER — Ambulatory Visit (INDEPENDENT_AMBULATORY_CARE_PROVIDER_SITE_OTHER): Payer: 59 | Admitting: Family Medicine

## 2013-06-29 ENCOUNTER — Encounter: Payer: Self-pay | Admitting: Family Medicine

## 2013-06-29 VITALS — BP 134/67 | HR 73 | Ht 59.75 in | Wt 175.0 lb

## 2013-06-29 DIAGNOSIS — I10 Essential (primary) hypertension: Secondary | ICD-10-CM

## 2013-06-29 DIAGNOSIS — E119 Type 2 diabetes mellitus without complications: Secondary | ICD-10-CM

## 2013-06-29 LAB — POCT GLYCOSYLATED HEMOGLOBIN (HGB A1C): Hemoglobin A1C: 7.2

## 2013-06-29 LAB — COMPLETE METABOLIC PANEL WITH GFR
ALT: 20 U/L (ref 0–35)
AST: 17 U/L (ref 0–37)
Albumin: 4 g/dL (ref 3.5–5.2)
Alkaline Phosphatase: 79 U/L (ref 39–117)
BUN: 13 mg/dL (ref 6–23)
CO2: 30 mEq/L (ref 19–32)
Calcium: 9.6 mg/dL (ref 8.4–10.5)
Chloride: 100 mEq/L (ref 96–112)
Creat: 0.63 mg/dL (ref 0.50–1.10)
GFR, Est African American: >89 mL/min
GFR, Est Non African American: >89 mL/min
Glucose, Bld: 111 mg/dL — ABNORMAL HIGH (ref 70–99)
Potassium: 3.7 mEq/L (ref 3.5–5.3)
Sodium: 140 mEq/L (ref 135–145)
Total Bilirubin: 0.4 mg/dL (ref 0.2–1.2)
Total Protein: 6.6 g/dL (ref 6.0–8.3)

## 2013-06-29 LAB — LIPID PANEL
Cholesterol: 165 mg/dL (ref 0–200)
HDL: 43 mg/dL (ref >39)
LDL Cholesterol: 104 mg/dL — ABNORMAL HIGH (ref 0–99)
Total CHOL/HDL Ratio: 3.8 Ratio
Triglycerides: 88 mg/dL (ref <150)
VLDL: 18 mg/dL (ref 0–40)

## 2013-06-29 NOTE — Progress Notes (Signed)
   Subjective:    Patient ID: Maria Nunez, female    DOB: 24-Apr-1960, 53 y.o.   MRN: [id number]  HPI Diabetes - no hypoglycemic events. No wounds or sores that are not healing well. No increased thirst or urination. Checking glucose at home. Taking medications as prescribed without any side effects. She started exercising.    Hypertension- Pt denies chest pain, SOB, dizziness, or heart palpitations.  Taking meds as directed w/o problems.  Denies medication side effects.    Review of Systems     Objective:   Physical Exam  Constitutional: She is oriented to person, place, and time. She appears well-developed and well-nourished.  HENT:  Head: Normocephalic and atraumatic.  Cardiovascular: Normal rate, regular rhythm and normal heart sounds.   Pulmonary/Chest: Effort normal and breath sounds normal.  Neurological: She is alert and oriented to person, place, and time.  Skin: Skin is warm and dry.  Psychiatric: She has a normal mood and affect. Her behavior is normal.          Assessment & Plan:  DM- Uncontrolled. Discussed different options. Continue work on diet exercise and followup in 3 months. Normally she is well-controlled is up a little bit today. Or consider increasing the metformin. She would like to continue work on diet and exercise and followup in 3 months. Unable to give a urine sample for urine microalbumin. We'll check it next time. Due for CMP and fasting lipid panel.  HTN- Well controlled today. Continue current regimen. Call if any palms. Otherwise followup in 3-4 months.

## 2013-07-20 ENCOUNTER — Other Ambulatory Visit: Payer: Self-pay | Admitting: Family Medicine

## 2013-07-23 ENCOUNTER — Telehealth: Payer: Self-pay | Admitting: Internal Medicine

## 2013-07-23 NOTE — Telephone Encounter (Signed)
Patient called regarding sulfa allergy with plans for colonoscopy prep, she has a colonoscopy with Dr. Collene Mares tomorrow and has been prescribed GaviLyte-G which is PEG + sodium sulfate I spoke with Altha Harm, one of our [employer]. The allergy related to sulfur drugs is different than that of sulfate allergy, and both the pharmacist and I feel that her risk of allergic reaction to colonoscopy prep in the setting of sulfur allergy is very very low  I called the patient back with this information and she will proceed to colonoscopy prep  I asked she notify us immediately should she develop an allergic reaction, along with taking Benadryl and seek emergency care. She voices understanding

## 2013-09-04 ENCOUNTER — Ambulatory Visit (INDEPENDENT_AMBULATORY_CARE_PROVIDER_SITE_OTHER): Payer: Self-pay | Admitting: Family Medicine

## 2013-09-04 ENCOUNTER — Ambulatory Visit: Payer: 59 | Admitting: Sports Medicine

## 2013-09-04 VITALS — BP 138/80 | Ht 59.0 in | Wt 178.6 lb

## 2013-09-04 DIAGNOSIS — E119 Type 2 diabetes mellitus without complications: Secondary | ICD-10-CM

## 2013-09-04 LAB — HEMOGLOBIN A1C: Hgb A1c MFr Bld: 6.9 % — AB (ref 4.0–6.0)

## 2013-09-04 NOTE — Progress Notes (Signed)
Subjective:  Patient presents today for 3 month diabetes follow-up as part of the employer-sponsored Link to Wellness program. Current diabetes regimen includes Metformin 500 mg ER once daily. Patient also continues on daily ASA, ARB, and statin. No med changes or major health changes at this time. Patient states she is under stress right now from family issues. Her father died last year and her sister is the executor of the will. There has been a lot of drama in between the siblings regarding the sale of her father's home and she states that her BP has been elevated. patient states that she is going to the gym three days a week. She sometimes goes to the gym with one of her daughters. Her last visit with Madilyn Fireman was in May. A1C at that point was 7.1%. She has a pending appointment for October to see her again. No medication changes since her last visit with me. Patient requested that I check her A1C today.   Disease Assessments:  Diabetes: Sees Diabetes provider 2 times per year; Type of Diabetes: Type 2; checks feet daily; MD managing Diabetes [place]; takes medications as prescribed; takes an aspirin a day; Diabetes Education classes at [facility]; hypoglycemia frequency rarely; uses glucometer;   checks blood glucose 2-6 times a week; Highest CBG 180; Lowest CBG 110; Other Diabetes History:  She is checking blood sugar three or four times a week. When she does check she is checking in the morning and then again before she goes to bed. Average fasting is 115-120, bedtime is usually 140. She did not bring her meter to the appointment. High/Low are patient reported. Denies hypoglycemia. She is adherent to taking medication- metformin 500 mg ER once daily.        Tobacco Assessment: Smoking Status: Never smoker; Last Reviewed: 09/04/2013   Social History:  Alcohol use: Non-Drinker; Caffeine use: 1 servings per day; Exercise habits: 60 minutes; Medication adherence adherent; Patient can afford  medications;   Patient knows the purpose/use of medications; Diet adherence 50-75% of the time; Exercise adherence 3-4 days a week; 150 minutes of exercise per week.  Physical Activity- She is going three days a week to the gym. 45 minutes on the treadmill and then she does the circuit. Lately she has been having trouble with her elbow and she has only been doing some of the weight machines. Right now it is just the leg press and the abdomen machines.  Nutrition- Patient reports no changes. She states that she sometimes snacks and cheats once in a while. She states that she is not buying cookies any more. She is eating Nutri Grain bars. She usually has one at work for a break. If she wakes up in the middle of the night she has a glass of milk and a nutri grain bar. This is usually once a week.    Preventive Care:      Dilated Eye Exam: 11/18/2012  Foot Exam: 07/19/2012  Other Preventive Care Notes:  Dental Exam- January 2015      Overall Health Assessments:  Vision:  Dilated Eye Exam: 11/18/2012   Vital Signs:  09/04/2013 4:27 PM (EST)Blood Pressure 138 / 80 mm/HgBMI 36.1; Height 4 ft 11 in; Weight 178.6 lbs   Testing:  Blood Sugar Tests: Hemoglobin A1c: 6.9 Via POCT resulted on 09/04/2013            Assessment/Plan: Patient is a 53 year old female with DM2. A1C today was 6.9% which is meeting goal of less than  7%. Patient states her last A1C with Dr. Madilyn Fireman a few months ago was 7.1% so this is a decrease from that. Patient is taking metformin 500 mg ER once daily. Patient continues to exercise and has increased to three days a week. She is not doing as many strength exercises with her arms due to pain in her elbow for the past few weeks. She has not made any changes to her eating patterns. Patient expressed frustration that she has not lost weight, despite starting to exercise. She saw a dietiatian in the past few months who gave her some suggestions that she has been  following. I suggested that she try tracking her food to see if there were areas that she could improve. There is a way to track food on our company's wellness website. Her caloric estimations from the RD were 1500 kcal/day. I will fax A1C results to Dr. Gardiner Ramus office. Follow up with patient in 6 months.   Teaching Goals:  Diabetes Mellitus: Other  Goals for Next Visit- 1. Continue going to the gym three days a week. 2. For two days, log all of your food into the [employer]. Next appointment to see me is Friday January 8th at 4 PM.

## 2013-09-11 ENCOUNTER — Encounter: Payer: Self-pay | Admitting: Sports Medicine

## 2013-09-11 ENCOUNTER — Encounter: Payer: Self-pay | Admitting: Family Medicine

## 2013-09-11 ENCOUNTER — Ambulatory Visit (INDEPENDENT_AMBULATORY_CARE_PROVIDER_SITE_OTHER): Payer: 59 | Admitting: Sports Medicine

## 2013-09-11 ENCOUNTER — Ambulatory Visit: Payer: 59 | Admitting: Sports Medicine

## 2013-09-11 VITALS — BP 143/79 | HR 87 | Ht 59.0 in | Wt 176.0 lb

## 2013-09-11 DIAGNOSIS — G5600 Carpal tunnel syndrome, unspecified upper limb: Secondary | ICD-10-CM

## 2013-09-11 DIAGNOSIS — G5603 Carpal tunnel syndrome, bilateral upper limbs: Secondary | ICD-10-CM

## 2013-09-11 DIAGNOSIS — M7712 Lateral epicondylitis, left elbow: Secondary | ICD-10-CM

## 2013-09-11 DIAGNOSIS — M7710 Lateral epicondylitis, unspecified elbow: Secondary | ICD-10-CM

## 2013-09-11 DIAGNOSIS — L989 Disorder of the skin and subcutaneous tissue, unspecified: Secondary | ICD-10-CM

## 2013-09-11 NOTE — Addendum Note (Signed)
Addended by: Doree Albee on: 09/11/2013 04:57 PM   Modules accepted: Orders

## 2013-09-11 NOTE — Assessment & Plan Note (Signed)
Repeat percutaneous needle tenotomy last injection worked for 5 months.

## 2013-09-11 NOTE — Assessment & Plan Note (Signed)
Appears to be a keratoacanthoma versus actinic keratosis. Excisional biopsy performed today. Return in one week for suture removal.

## 2013-09-11 NOTE — Assessment & Plan Note (Signed)
Persistent symptoms despite nighttime splinting. Return in a month for median nerve hypertension on the right side.

## 2013-09-11 NOTE — Progress Notes (Addendum)
  Subjective:    CC: Elbow pain  HPI: This is a very pleasant 53 year old female, we did a left common extensor tendon percutaneous needle tenotomy approximately 8 months ago, she did very well. She has a recurrence and pain and would like a repeat procedure.  Right carpal tunnel syndrome: No improvement with nighttime splinting, declines median nerve hydrodissection today.  Left forearm lesion: Has been treated by another provider with cryotherapy, it is persistent.  Past medical history, Surgical history, Family history not pertinant except as noted below, Social history, Allergies, and medications have been entered into the medical record, reviewed, and no changes needed.   Review of Systems: No fevers, chills, night sweats, weight loss, chest pain, or shortness of breath.   Objective:    General: Well Developed, well nourished, and in no acute distress.  Neuro: Alert and oriented x3, extra-ocular muscles intact, sensation grossly intact.  HEENT: Normocephalic, atraumatic, pupils equal round reactive to light, neck supple, no masses, no lymphadenopathy, thyroid nonpalpable.  Skin: Warm and dry, no rashes. There is a 1.1 cm, raised lesion that appears like an actinic keratosis versus keratoacanthoma on the left dorsal forearm. Cardiac: Regular rate and rhythm, no murmurs rubs or gallops, no lower extremity edema.  Respiratory: Clear to auscultation bilaterally. Not using accessory muscles, speaking in full sentences.  Procedure: Real-time Ultrasound Guided percutaneous needle tenotomy of left thumb extensor tendon origin at the elbow Device: GE Logiq E  Verbal informed consent obtained.  Time-out conducted.  Noted no overlying erythema, induration, or other signs of local infection.  Skin prepped in a sterile fashion.  Local anesthesia: Topical Ethyl chloride.  With sterile technique and under real time ultrasound guidance:  A total of 1 cc Kenalog 40, 4 cc lidocaine were injected  easily both superficial to and deep to the common extensor tendon at the elbow with approximately 50 passes to perform the percutaneous tenotomy. Completed without difficulty  Pain immediately resolved suggesting accurate placement of the medication.  Advised to call if fevers/chills, erythema, induration, drainage, or persistent bleeding.  Images permanently stored and available for review in the ultrasound unit.  Impression: Technically successful ultrasound guided injection.  Procedure:  Excision of  left forearm possibly malignant lesion approximately 1.1 cm Risks, benefits, and alternatives explained and consent obtained. Time out conducted. Surface prepped with alcohol. 5cc lidocaine with epinephine infiltrated in a field block. Adequate anesthesia ensured. Area prepped and draped in a sterile fashion. Excision performed with: #11 blade used to make an elliptical incision around the lesion, the lesion was removed en bloc and a single 4-0 Prolene suture was placed in a horizontal mattress pattern. Hemostasis achieved. Pt stable.  Impression and Recommendations:    I spent 40 minutes with this patient, greater than 50% was face-to-face time counseling regarding the multiple below diagnoses.

## 2013-09-12 ENCOUNTER — Encounter: Payer: Self-pay | Admitting: Family Medicine

## 2013-09-12 NOTE — Progress Notes (Signed)
Patient ID: Maria Nunez, female   DOB: December 14, 1960, 53 y.o.   MRN: [id number] Reviewed: Agree with the documentation and management of our pharmacologist.

## 2013-09-13 ENCOUNTER — Encounter: Payer: Self-pay | Admitting: Sports Medicine

## 2013-09-13 ENCOUNTER — Telehealth: Payer: Self-pay | Admitting: *Deleted

## 2013-09-13 MED ORDER — HYDROCODONE-ACETAMINOPHEN 5-325 MG PO TABS: 1.0000 | ORAL_TABLET | Freq: Three times a day (TID) | ORAL | Status: DC | PRN

## 2013-09-13 NOTE — Telephone Encounter (Signed)
Maria Nunez was notified and voiced understanding. Did not wish to get pain meds filled at this time. Margette Fast, CMA

## 2013-09-13 NOTE — Telephone Encounter (Signed)
Maria Nunez call this morning stating that she is experiencing more elbow pain with redness, swelling and warm to touch. She states that the swelling is from her elbow to shoulder. 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 Please advise. Margette Fast, CMA

## 2013-09-13 NOTE — Telephone Encounter (Signed)
This is pretty normal after a percutaneous needle tenotomy, ice it, compress it, and I'm going to add a low-dose hydrocodone. Rx in box, she should start with just half a tablet for pain.

## 2013-09-14 ENCOUNTER — Encounter: Payer: Self-pay | Admitting: Sports Medicine

## 2013-09-19 ENCOUNTER — Ambulatory Visit (INDEPENDENT_AMBULATORY_CARE_PROVIDER_SITE_OTHER): Payer: 59 | Admitting: Sports Medicine

## 2013-09-19 VITALS — Ht 59.0 in | Wt 176.0 lb

## 2013-09-19 DIAGNOSIS — L989 Disorder of the skin and subcutaneous tissue, unspecified: Secondary | ICD-10-CM

## 2013-09-19 NOTE — Progress Notes (Signed)
   Subjective:    Patient ID: Maria Nunez, female    DOB: 29-Jul-1960, 53 y.o.   MRN: [id number]  HPI Patient presents today for left forearm suture removal which was done without complication. Suture area was healed and she denied tenderness or pain to site. Margette Fast, CMA    Review of Systems     Objective:   Physical Exam        Assessment & Plan:

## 2013-09-19 NOTE — Assessment & Plan Note (Signed)
Suture removal as above. Return as needed.

## 2013-09-25 ENCOUNTER — Ambulatory Visit: Payer: 59 | Admitting: Sports Medicine

## 2013-10-03 ENCOUNTER — Encounter: Payer: Self-pay | Admitting: Family Medicine

## 2013-10-03 ENCOUNTER — Ambulatory Visit (INDEPENDENT_AMBULATORY_CARE_PROVIDER_SITE_OTHER): Payer: 59 | Admitting: Family Medicine

## 2013-10-03 VITALS — BP 131/68 | HR 84 | Ht 59.0 in | Wt 174.0 lb

## 2013-10-03 DIAGNOSIS — E7800 Pure hypercholesterolemia, unspecified: Secondary | ICD-10-CM

## 2013-10-03 DIAGNOSIS — E119 Type 2 diabetes mellitus without complications: Secondary | ICD-10-CM

## 2013-10-03 DIAGNOSIS — I10 Essential (primary) hypertension: Secondary | ICD-10-CM

## 2013-10-03 LAB — POCT UA - MICROALBUMIN
Albumin/Creatinine Ratio, Urine, POC: <30
Creatinine, POC: 300 mg/dL
Microalbumin Ur, POC: 80 mg/L

## 2013-10-03 NOTE — Assessment & Plan Note (Signed)
Well controlled 

## 2013-10-03 NOTE — Progress Notes (Signed)
   Subjective:    Patient ID: Maria Nunez, female    DOB: 01/10/1961, 53 y.o.   MRN: [id number]  Diabetes   Diabetes - no hypoglycemic events. No wounds or sores that are not healing well. No increased thirst or urination. Checking glucose at home. Taking medications as prescribed without any side effects. Last A1C was 6.9 with Megan on 09/04/13.    Hypertension- Pt denies chest pain, SOB, dizziness, or heart palpitations.  Taking meds as directed w/o problems.  Denies medication side effects.    Hyperlipidemia-last LDL is just above goal. Rodman Key point. She currently takes pravastatin 40 mg daily.  Review of Systems     Objective:   Physical Exam  Constitutional: She is oriented to person, place, and time. She appears well-developed and well-nourished.  HENT:  Head: Normocephalic and atraumatic.  Cardiovascular: Normal rate, regular rhythm and normal heart sounds.   Pulmonary/Chest: Effort normal and breath sounds normal.  Neurological: She is alert and oriented to person, place, and time.  Skin: Skin is warm and dry.  Psychiatric: She has a normal mood and affect. Her behavior is normal.          Assessment & Plan:

## 2013-10-03 NOTE — Assessment & Plan Note (Signed)
At goal. Making great improvements. Her A1c is down to 6.9. Up to get her back to where she was last year at 6.5 on her current regimen. She's been going to the gym regularly. Follow up in 3 months. Microalbumin performed today.

## 2013-10-03 NOTE — Assessment & Plan Note (Signed)
Almost at goal with her LDL. It sounds 104 but based on her diabetes needs to be under 100. Given a lab slip to just check direct LDL. I encouraged her to go in the next month or 2.

## 2013-11-15 ENCOUNTER — Other Ambulatory Visit: Payer: Self-pay | Admitting: Family Medicine

## 2013-11-21 ENCOUNTER — Encounter: Payer: Self-pay | Admitting: Family Medicine

## 2013-11-21 ENCOUNTER — Ambulatory Visit (INDEPENDENT_AMBULATORY_CARE_PROVIDER_SITE_OTHER): Payer: 59 | Admitting: Family Medicine

## 2013-11-21 VITALS — BP 137/62 | HR 79 | Temp 98.0°F | Wt 178.0 lb

## 2013-11-21 DIAGNOSIS — L720 Epidermal cyst: Secondary | ICD-10-CM

## 2013-11-21 DIAGNOSIS — L723 Sebaceous cyst: Secondary | ICD-10-CM

## 2013-11-21 MED ORDER — MUPIROCIN 2 % EX OINT
TOPICAL_OINTMENT | Freq: Two times a day (BID) | CUTANEOUS | Status: DC
Start: 2013-11-21 — End: 2014-01-02

## 2013-11-21 MED ORDER — CEPHALEXIN 500 MG PO CAPS: 500.0000 mg | ORAL_CAPSULE | Freq: Three times a day (TID) | ORAL | Status: DC

## 2013-11-21 NOTE — Progress Notes (Signed)
   Subjective:    Patient ID: Maria Nunez, female    DOB: [birth year], 53 y.o.   MRN: [id number]  HPI She noticed a cyst on the right breast at the lower fold this morning. She said it did trend a little bit of pus. It's a little bit tender area the pain radiates towards the medial side of the breast. No streaking redness or erythema. No increased warmth. No fevers chills or sweats. She denies any trauma or injury and does not remove her seeing a lump or cyst prior to this morning.   Review of Systems     Objective:   Physical Exam  Constitutional: She is oriented to person, place, and time. She appears well-developed and well-nourished.  HENT:  Head: Normocephalic and atraumatic.  Pulmonary/Chest:    Neurological: She is alert and oriented to person, place, and time.  Skin: Skin is warm and dry.  Psychiatric: She has a normal mood and affect.          Assessment & Plan:  Ruptured sebaceous cyst-right now is very small. We'll put her on Keflex to prevent any type of infection. She has multiple drug allergies but says that she has taken Keflex in the past and did okay on it and prefers to take this again and so trying something she has never taken. Lying mupirocin ointment. Keep covered with gauze. Call if not improving and healing of the next couple weeks. If she feels that the cyst is reforming then we can have her come in for an excision.

## 2013-11-22 ENCOUNTER — Encounter: Payer: Self-pay | Admitting: Family Medicine

## 2013-11-22 NOTE — Progress Notes (Signed)
   Subjective:    Patient ID: Maria Nunez, female    DOB: 1960-03-10, 53 y.o.   MRN: [id number]  HPI Reviewed: Agree with the documentation and management of our Alex[facility] Pharmacologist.   Review of Systems     Objective:   Physical Exam        Assessment & Plan:

## 2013-11-27 ENCOUNTER — Encounter: Payer: Self-pay | Admitting: Family Medicine

## 2013-12-12 ENCOUNTER — Telehealth: Payer: Self-pay | Admitting: Obstetrics and Gynecology

## 2013-12-12 NOTE — Telephone Encounter (Signed)
Left message upcoming appointment 06/2014 has been canceled and needs to be rescheduled.

## 2014-01-02 ENCOUNTER — Ambulatory Visit (INDEPENDENT_AMBULATORY_CARE_PROVIDER_SITE_OTHER): Payer: 59 | Admitting: Family Medicine

## 2014-01-02 ENCOUNTER — Encounter: Payer: Self-pay | Admitting: Family Medicine

## 2014-01-02 VITALS — BP 125/67 | HR 81 | Ht 59.0 in | Wt 175.0 lb

## 2014-01-02 DIAGNOSIS — E119 Type 2 diabetes mellitus without complications: Secondary | ICD-10-CM

## 2014-01-02 DIAGNOSIS — B351 Tinea unguium: Secondary | ICD-10-CM

## 2014-01-02 DIAGNOSIS — I10 Essential (primary) hypertension: Secondary | ICD-10-CM

## 2014-01-02 LAB — POCT GLYCOSYLATED HEMOGLOBIN (HGB A1C): Hemoglobin A1C: 7

## 2014-01-02 MED ORDER — EFINACONAZOLE 10 % EX SOLN: 1.0000 "application " | Freq: Every day | CUTANEOUS | Status: DC

## 2014-01-02 MED ORDER — METFORMIN HCL 1000 MG PO TABS: 1000.0000 mg | ORAL_TABLET | Freq: Two times a day (BID) | ORAL | Status: DC

## 2014-01-02 NOTE — Progress Notes (Signed)
   Subjective:    Patient ID: Maria Nunez, female    DOB: [birth year], 53 y.o.   MRN: [id number]  HPI Diabetes - no hypoglycemic events. No wounds or sores that are not healing well. No increased thirst or urination. Checking glucose at home. Taking medications as prescribed without any side effects.  Hasn't been going to the gym for about a month.    Hypertension- Pt denies chest pain, SOB, dizziness, or heart palpitations.  Taking meds as directed w/o problems.  Denies medication side effects.    Left great toenail - would like me to look at it says not sure if has a fungus from getting pedicure    Review of Systems     Objective:   Physical Exam  Constitutional: She is oriented to person, place, and time. She appears well-developed and well-nourished.  HENT:  Head: Normocephalic and atraumatic.  Right Ear: External ear normal.  Left Ear: External ear normal.  Nose: Nose normal.  Mouth/Throat: Oropharynx is clear and moist.  TMs and canals are clear.   Eyes: Conjunctivae and EOM are normal. Pupils are equal, round, and reactive to light.  Neck: Neck supple. No thyromegaly present.  Cardiovascular: Normal rate, regular rhythm and normal heart sounds.   Pulmonary/Chest: Effort normal and breath sounds normal. She has no wheezes.  Lymphadenopathy:    She has no cervical adenopathy.  Neurological: She is alert and oriented to person, place, and time.  Skin: Skin is warm and dry.  Left great toenial has yellow/brown discoloration under the lateral border and some dry cracking along teh distal end.  Psychiatric: She has a normal mood and affect. Her behavior is normal.          Assessment & Plan:  DM- well controlled. A1C is 7.0.  Will inc metformin to 100mg .  F/u in 3 mo. Get back to the gym.    HTN - well controlled.  Continue current regimen.    Oncyhomycosis, left great toenail - will try Jublia. Coupon given. Call if not helping. May need to consider oral lamisil.

## 2014-01-03 LAB — LDL CHOLESTEROL, DIRECT: Direct LDL: 125 mg/dL — ABNORMAL HIGH

## 2014-01-08 ENCOUNTER — Telehealth: Payer: Self-pay | Admitting: *Deleted

## 2014-01-08 NOTE — Telephone Encounter (Signed)
Pt called and stated that she cannot take the metforim its caused GI upset and cannot take lipitor she reports taking that years ago and did not do well. She stated that she will diet and exercise and not take lipitor for now. She would like to split the metformin in half and see how this works.Maria Nunez

## 2014-01-26 DIAGNOSIS — R935 Abnormal findings on diagnostic imaging of other abdominal regions, including retroperitoneum: Secondary | ICD-10-CM

## 2014-01-26 HISTORY — DX: Abnormal findings on diagnostic imaging of other abdominal regions, including retroperitoneum: R93.5

## 2014-02-19 ENCOUNTER — Ambulatory Visit (INDEPENDENT_AMBULATORY_CARE_PROVIDER_SITE_OTHER): Payer: Self-pay | Admitting: Family Medicine

## 2014-02-19 VITALS — BP 130/70 | Wt 174.0 lb

## 2014-02-19 DIAGNOSIS — E119 Type 2 diabetes mellitus without complications: Secondary | ICD-10-CM

## 2014-02-19 NOTE — Assessment & Plan Note (Signed)
Subjective:  Patient presents today for 3 month diabetes follow-up as part of the employer-sponsored [employer] program. Current diabetes regimen include metformin 500 mg ER once daily. Patient also continues on daily ASA, ARB, and statin.   Patient states that things have been OK since her last appointment with me. She states that she is still going to the gym to exercise 2-3 times a week and is trying to cut back on portion sizes.  Patient has lost almost 5 pounds since her last appointment with me. She thinks she has lost weight because she is back on her diet and is exercising.  Last appointment with Dr. Madilyn Fireman was in the beginning of December. A1C was 7.0%. Metformin was increased to 1000 mg ER twice daily and she couldn't tolerate it due to GI issues. She also tried metformin 500 mg ER BID and that caused increased stomach pains. So she resumed taking 500 mg ER once daily which she can tolerate. No other medication changes.   Disease Assessments:  Diabetes: Sees Diabetes provider 2 times per year; Type of Diabetes: Type 2; checks feet daily; MD managing Diabetes [place]; takes medications as prescribed; takes an aspirin a day; Diabetes Education classes at [facility]; hypoglycemia frequency rarely; uses glucometer; checks blood glucose 2-6 times a week; 7 day CBG average 133; 14 day CBG average 132; 30 day CBG average 133; Highest CBG 202 2 hours after pizza; Lowest CBG 95;   Other Diabetes History:  She is checking in the morning, sometimes at night before bed. She is checking 4-5 times a week. Denies hypoglycemia.  Majority of fasting CBG are between 110-130. Bedtime numbers are higher- 150-170. Most recent A1C was 7.0% December 8th.   Past Medical/Surgical History:  Podiatrist: 12/26/2010   Tobacco Assessment: Smoking Status: Never smoker; Last Reviewed: 02/19/2014   Social History:  Alcohol use: Non-Drinker; Caffeine use: 1 servings per day; Exercise habits: 60 minutes;  Medication adherence adherent; Patient can afford medications; Patient knows the purpose/use of medications; Diet adherence 50-75% of the time; Exercise adherence 3-4 days a week; 150 minutes of exercise per week.  Physical Activity- She has not been going in the last couple of weeks because she states it was too cold. Previously she was going two- three days a week to the gym. 45 minutes on the treadmill. Lately she has been having trouble with her elbow and she has only been doing some of the weight machines. Right now it is just the leg press and the abdomen machines. She states that this week she has an evening course she is taking at can't go until the weekend.  Nutrition- She states that things have been going well. She is not buying cookies and has cut back on snacking. Weight is down 4 pounds since her last appointment with em.    Preventive Care:    Hemoglobin A1c: 09/04/2013    Dilated Eye Exam: 11/18/2012  Flu vaccine: 09/26/2013  Foot Exam: 07/19/2012  Other Preventive Care Notes:  Dental Exam- October 2015      Overall Health Assessments:  Vision:  Dilated Eye Exam: 11/18/2012   Vital Signs:  02/19/2014 11:32 AM (EST)Blood Pressure 130 / 70 mm/HgBMI 35.1; Height 4 ft 11 in; Weight 174 lbs   Testing:  Blood Sugar Tests: Hemoglobin A1c: 7.0 via Epic resulted on 01/02/2014   Care Planning:  Learning Preference Assessment:  Learner: Patient  Readiness to Learn Barriers: None  Teaching Method: Explanation  Evaluation of Learning: Can function independently  and verbalize knowledge  Readiness to Change:  How important is your health to you? 10  How confident are you in working to improve your health? 8  How ready are you to change to improve your health? 10  Total Score: 9  Care Plan:  02/19/2014 11:32 AM (EST) (1)  Problem: Engaging in Physical Activity  Role: [place] going to exercise at the gym 2-3 days a week, even if it is cold  or if the gym is crowded.  Date Started: 02/19/2014     Assessment/Plan: Patient is a 54 year old female with DM2. Most recent A1C was 7.0% in December and is at goal of 7% or less. Patient has lost 4 pounds since her last visit with me and she is very excited about this. Previously she had difficulty losing weight. She has been exercising consistently (with the exception of this past week) and she states that she has cut back on snacking, especially on concentrated sweets.  At her last visit with PCP metformin was increased to 1000 mg ER BID. She was unable to tolerate the dose increase due to stomach pain and cramping. Discussed with patient alternatives to metformin, including Victoza and Invokana. Patient wants to continue working on lifestyle modifications prior to any medication changes. Follow up with patient in 4 months.  Goals for Next Visit- 1. Track your food for 1 week on the [employer] Website. 2. Get back into the routine of going to exercise at the gym three days a week. Don't let the cold weather be an excuse. Next appointment to see me is Monday April 25 at 8 AM.

## 2014-03-20 ENCOUNTER — Encounter: Payer: Self-pay | Admitting: Obstetrics and Gynecology

## 2014-03-23 ENCOUNTER — Encounter: Payer: Self-pay | Admitting: Obstetrics and Gynecology

## 2014-03-23 ENCOUNTER — Ambulatory Visit (INDEPENDENT_AMBULATORY_CARE_PROVIDER_SITE_OTHER): Payer: 59 | Admitting: Obstetrics and Gynecology

## 2014-03-23 VITALS — BP 138/78 | HR 80 | Ht 59.5 in | Wt 173.4 lb

## 2014-03-23 DIAGNOSIS — N760 Acute vaginitis: Secondary | ICD-10-CM

## 2014-03-23 DIAGNOSIS — Z113 Encounter for screening for infections with a predominantly sexual mode of transmission: Secondary | ICD-10-CM

## 2014-03-23 NOTE — Progress Notes (Signed)
Patient ID: Maria Nunez, female   DOB: 08-31-1960, 54 y.o.   MRN: [id number] GYNECOLOGY VISIT  PCP:  Beatrice Lecher, MD  Referring provider:   HPI: 54 y.o.   Divorced  Caucasian  female   [phone] with No LMP recorded. Patient has had a hysterectomy.  Still has ovaries.  here for vaginal discharge with odor.  Patient also complains of lower pelvic cramping x1 week.    Notes some itching with ovulation and then odor and itching.  Some itching.  No bleeding.  No self treatment.  New partner in October 2015.  No condom use.  Not douching.   Has annual exam and pap scheduled for June 2016.    GYNECOLOGIC HISTORY: No LMP recorded. Patient has had a hysterectomy. Sexually active: no currently Partner preference: female Contraception: Supracervical Hysterectomy Menopausal hormone therapy: none DES exposure:  no Blood transfusions:  no  Sexually transmitted diseases:  HPV  GYN procedures and prior surgeries:  C-section, Supracervical Hysterectomy Last mammogram:   06-05-13 fibroglandular density/nl:[facility]              Last pap and high risk HPV testing: 03-24-13 with Pos HR HPV   History of abnormal pap smear:  LEEP 05-04-13 showed HPV effect, no high grade dysplasia or malignancy.  ECC benign.   OB History    Gravida Para Term Preterm AB TAB SAB Ectopic Multiple Living   3 2 2  1  1   2        Patient Active Problem List   Diagnosis Date Noted  . Skin lesion of left arm 09/11/2013  . Left lateral epicondylitis 04/05/2013  . Carpal tunnel syndrome, bilateral 04/05/2013  . Tennis elbow 03/24/2013  . LGSIL (low grade squamous intraepithelial dysplasia) 06/16/2012  . Plantar fasciosis 11/26/2011  . Diabetes mellitus without complication 46/65/9935  . PANIC ATTACK 09/07/2008  . RESTLESS LEG SYNDROME 04/02/2008  . POLYARTHRITIS 04/02/2008  . KNEE PAIN, LEFT 01/02/2008  . HOT FLASHES 08/04/2006  . HYPERCHOLESTEROLEMIA 11/03/2005  . HYPERTENSION, BENIGN SYSTEMIC  11/03/2005    Past Medical History  Diagnosis Date  . Hypertension   . Allergy   . Hidradenitis   . Diabetes mellitus     borderline  . Fibroid   . Heart murmur     Past Surgical History  Procedure Laterality Date  . Cesarean section    . Inguinal hidradenitis excision    . Breast surgery      cysts removed right breast  . Abdominal hysterectomy      supracervical    Current Outpatient Prescriptions  Medication Sig Dispense Refill  . aspirin 81 MG tablet Take 81 mg by mouth daily.      [name] FOLIC ACID PO Take 701 mcg by mouth daily.     Elmore Guise Device MISC      . losartan-hydrochlorothiazide (HYZAAR) 100-25 MG per tablet Take 1 tablet by mouth daily. 90 tablet 3  . metFORMIN (GLUCOPHAGE) 1000 MG tablet Take 1 tablet (1,000 mg total) by mouth 2 (two) times daily with a meal. (Patient not taking: Reported on 02/19/2014) 180 tablet 3  . Multiple Vitamin (MULTIVITAMIN) tablet Take 1 tablet by mouth daily.      . Omega-3 Fatty Acids (FISH OIL) 1000 MG CAPS Take 1,000 mg by mouth daily. 4 tabs    . pravastatin (PRAVACHOL) 40 MG tablet TAKE 1 TABLET BY MOUTH DAILY AT BEDTIME. 90 tablet 3  . TRUETEST TEST test strip TEST BLOOD SUGAR  TWICE DAILY 200 each 2  . Zinc Sulfate (ZINC 15 PO) Take by mouth.       No current facility-administered medications for this visit.     ALLERGIES: Cefaclor; Cephalexin; Hycodan; Penicillins; Sulfonamide derivatives; and Tetracycline  Family History  Problem Relation Age of Onset  . Asthma Mother   . COPD Mother   . Diabetes Mother   . Hypertension Mother   . Diabetes Father   . Hypertension Father   . Breast cancer Maternal Aunt   . Breast cancer Paternal Aunt     History   Social History  . Marital Status: Divorced    Spouse Name: N/A  . Number of Children: N/A  . Years of Education: N/A   Occupational History  . Not on file.   Social History Main Topics  . Smoking status: Never Smoker   . Smokeless tobacco: Never Used  .  Alcohol Use: No  . Drug Use: No  . Sexual Activity: No     Comment: TAH/supracervical   Other Topics Concern  . Not on file   Social History Narrative    ROS:  Pertinent items are noted in HPI.  PHYSICAL EXAMINATION:    BP 138/78 mmHg  Pulse 80  Ht 4' 11.5" (1.511 m)  Wt 173 lb 6.4 oz (78.654 kg)  BMI 34.45 kg/m2   Wt Readings from Last 3 Encounters:  03/23/14 173 lb 6.4 oz (78.654 kg)  02/19/14 174 lb (78.926 kg)  01/02/14 175 lb (79.379 kg)     Ht Readings from Last 3 Encounters:  03/23/14 4' 11.5" (1.511 m)  01/02/14 4\' 11"  (1.499 m)  10/03/13 4\' 11"  (1.499 m)    General appearance: alert, cooperative and appears stated age   Pelvic: External genitalia:  no lesions              Urethra:  normal appearing urethra with no masses, tenderness or lesions              Bartholins and Skenes: normal                 Vagina: normal appearing vagina with normal color and discharge green and frothy, no lesions              Cervix: normal appearance                 Bimanual Exam:  Uterus:   absent                                      Adnexa: normal adnexa in size, nontender and no masses                                        ASSESSMENT  History of supracervical hysterectomy.  Vaginitis.   Potential exposure to STDs. History of LEEP in 2015.  PLAN  Affirm testing.  HIV, RPR, Hep B and C, GC/CT.  Discussed condom use.  Keep annual exam for June.  Needs pap then.   15 minutes face to face time of which over 50% was spent in counseling.  An After Visit Summary was printed and given to the patient.

## 2014-03-24 LAB — GC/CHLAMYDIA PROBE AMP, URINE
Chlamydia, Swab/Urine, PCR: NEGATIVE
GC Probe Amp, Urine: NEGATIVE

## 2014-03-24 LAB — STD PANEL
HIV 1&2 Ab, 4th Generation: NONREACTIVE
Hepatitis B Surface Ag: NEGATIVE

## 2014-03-24 LAB — HEPATITIS C ANTIBODY: HCV Ab: NEGATIVE

## 2014-03-26 ENCOUNTER — Other Ambulatory Visit: Payer: Self-pay | Admitting: Obstetrics and Gynecology

## 2014-03-26 ENCOUNTER — Encounter: Payer: Self-pay | Admitting: Obstetrics and Gynecology

## 2014-03-26 ENCOUNTER — Telehealth: Payer: Self-pay | Admitting: Obstetrics and Gynecology

## 2014-03-26 LAB — WET PREP BY MOLECULAR PROBE
Candida species: NEGATIVE
Gardnerella vaginalis: POSITIVE — AB
Trichomonas vaginosis: NEGATIVE

## 2014-03-26 MED ORDER — METRONIDAZOLE 500 MG PO TABS: 500.0000 mg | ORAL_TABLET | Freq: Two times a day (BID) | ORAL | Status: DC

## 2014-03-26 NOTE — Telephone Encounter (Signed)
Patient calling for results.

## 2014-03-26 NOTE — Telephone Encounter (Signed)
Left message to call P[place]is at [phone].   Notes Recorded by Jamey Reas de Berton Lan, MD on 03/26/2014 at 10:06 AM Negative STD results to patient through My Chart.  Affirm pending.

## 2014-03-26 NOTE — Telephone Encounter (Signed)
Spoke with patient. Advised patient of message as seen below from Cisne. Patient is agreeable and verbalizes understanding.  Routing to provider for final review. Patient agreeable to disposition. Will close encounter

## 2014-03-27 ENCOUNTER — Encounter: Payer: Self-pay | Admitting: Obstetrics and Gynecology

## 2014-03-27 NOTE — Telephone Encounter (Signed)
Message left to return call to Esmeralda Blanford at [phone].    

## 2014-03-28 ENCOUNTER — Other Ambulatory Visit: Payer: Self-pay | Admitting: Obstetrics and Gynecology

## 2014-03-28 ENCOUNTER — Telehealth: Payer: Self-pay

## 2014-03-28 DIAGNOSIS — R102 Pelvic and perineal pain: Secondary | ICD-10-CM

## 2014-03-28 NOTE — Telephone Encounter (Signed)
Spoke with patient. She is advised of mychart message from Dr. Quincy Simmonds, patient states she has not read it yet.  She is agreeable to scheduling ultrasound, but would like to know costs first before committing to an appointment. Advised patient would be contacted with costs and appointment scheduled.

## 2014-03-28 NOTE — Telephone Encounter (Signed)
Encounter opened in error. Please see telephone encounter attached to mychart message dated 03/27/2014. Will close encounter.

## 2014-03-28 NOTE — Telephone Encounter (Deleted)
-----   Message from Oliver, MD sent at 03/28/2014  6:40 AM EST ----- Regarding: Please schedule pelvic ultrasound with me for tomorrow Please contact patient and schedule pelvic ultrasound appointment for me for tomorrow. Diagnosis is pelvic cramping. I am placing an order now.  She contacted me through My Chart, and I responded that this would be the plan.  Thank you!

## 2014-03-30 ENCOUNTER — Telehealth: Payer: Self-pay | Admitting: Obstetrics and Gynecology

## 2014-03-30 NOTE — Telephone Encounter (Signed)
Spoke with patient. Advised of benefit quote received for PUS. Patient agreeable. Scheduled PUS. Advised patient of 72 hour cancellation policy and $117 cancellation fee. Patient agreeable.

## 2014-03-30 NOTE — Telephone Encounter (Signed)
Left message for patient to call back. Need to go over benefits and schedule PUS °

## 2014-04-03 ENCOUNTER — Encounter: Payer: Self-pay | Admitting: Family Medicine

## 2014-04-03 ENCOUNTER — Ambulatory Visit (INDEPENDENT_AMBULATORY_CARE_PROVIDER_SITE_OTHER): Payer: 59 | Admitting: Family Medicine

## 2014-04-03 VITALS — BP 137/72 | HR 83 | Ht 59.5 in | Wt 174.0 lb

## 2014-04-03 DIAGNOSIS — G5602 Carpal tunnel syndrome, left upper limb: Secondary | ICD-10-CM

## 2014-04-03 DIAGNOSIS — I10 Essential (primary) hypertension: Secondary | ICD-10-CM

## 2014-04-03 DIAGNOSIS — G5603 Carpal tunnel syndrome, bilateral upper limbs: Secondary | ICD-10-CM

## 2014-04-03 DIAGNOSIS — E119 Type 2 diabetes mellitus without complications: Secondary | ICD-10-CM

## 2014-04-03 DIAGNOSIS — G5601 Carpal tunnel syndrome, right upper limb: Secondary | ICD-10-CM

## 2014-04-03 DIAGNOSIS — Z23 Encounter for immunization: Secondary | ICD-10-CM | POA: Diagnosis not present

## 2014-04-03 DIAGNOSIS — M7712 Lateral epicondylitis, left elbow: Secondary | ICD-10-CM | POA: Diagnosis not present

## 2014-04-03 LAB — POCT GLYCOSYLATED HEMOGLOBIN (HGB A1C): Hemoglobin A1C: 6.9

## 2014-04-03 NOTE — Progress Notes (Signed)
Patient ID: Maria Nunez, female   DOB: 23-Sep-1960, 54 y.o.   MRN: [id number] ATTENDING PHYSICIAN NOTE: I have reviewed the chart and agree with the plan as detailed above. Dorcas Mcmurray MD Pager [phone]

## 2014-04-03 NOTE — Progress Notes (Signed)
   Subjective:    Patient ID: Maria Nunez, female    DOB: 02-14-1960, 54 y.o.   MRN: [id number]  HPI Diabetes - no hypoglycemic events. No wounds or sores that are not healing well. No increased thirst or urination. Checking glucose at home. Taking medications as prescribed without any side effects. She is now up to 500mg  BID on the metformin.   Hypertension- Pt denies chest pain, SOB, dizziness, or heart palpitations.  Taking meds as directed w/o problems.  Denies medication side effects.    Left lateral epicondylitis - Has already had 2 injections and says each worked well for about 3 months.  She is wanting to do yet know what to do next. She's also noticed some permanent skin color change over the area that she's concerned about. It continues to be sore and tender she says even lifting a glass of water can be painful at times. She does have a elbow strap but doesn't wear it consistently.  Review of Systems     Objective:   Physical Exam  Constitutional: She is oriented to person, place, and time. She appears well-developed and well-nourished.  HENT:  Head: Normocephalic and atraumatic.  Cardiovascular: Normal rate, regular rhythm and normal heart sounds.   Pulmonary/Chest: Effort normal and breath sounds normal.  Neurological: She is alert and oriented to person, place, and time.  Skin: Skin is warm and dry.  Psychiatric: She has a normal mood and affect. Her behavior is normal.   Left lateral epicondylitis.  Tender over the area.  The area is hypervascular. It blanches with touch.  No hyper or hypo pigmentation.        Assessment & Plan:  DM- At goal.  Discussed increasing metformin to 500mg  TID ot 500mg  in AM with 1000mg  PM.  On statin, and ASA and ARB.  She is overdue for eye exam. REminded her to schedule.  F/U in 3 months.   HTN - well controlled  Left lateral epicondylitis - Will ordre PT. REally has to change her work station. . Discussed with her that the most  beneficial thing is to change her work environment so she is not continuing to stress the elbow and so it will heal. She does feel like her keyboard sits a little too high. She actually will be new moving into a new building in about a month and we'll get a different desk set up at that time. I recommend avoiding further injection at this point in time. She does have some increased vascularity over the lateral epicondyles.  bilat carpal tunnel - refer to PT as well.   Tdap given today.

## 2014-04-10 ENCOUNTER — Ambulatory Visit (INDEPENDENT_AMBULATORY_CARE_PROVIDER_SITE_OTHER): Payer: 59 | Admitting: Physical Therapy

## 2014-04-10 ENCOUNTER — Encounter: Payer: Self-pay | Admitting: Physical Therapy

## 2014-04-10 DIAGNOSIS — M25522 Pain in left elbow: Secondary | ICD-10-CM

## 2014-04-10 DIAGNOSIS — R29898 Other symptoms and signs involving the musculoskeletal system: Secondary | ICD-10-CM

## 2014-04-10 NOTE — Patient Instructions (Signed)
Towel Roll Squeeze   With right forearm resting on surface, gently squeeze towel. Repeat _10___ times per set. Do __1__ sets per session. Do __5-6__ sessions per day.  Copyright  VHI. All rights reserved.  Wrist Flexor Stretch   Keeping elbow straight, grasp left hand and slowly bend wrist back until stretch is felt. Hold __30__ seconds. Relax. Repeat _2___ times per set. Do ___1_ sets per session. Do __2__ sessions per day.  Copyright  VHI. All rights reserved.  Wrist Extensor Stretch   Keeping elbow straight, grasp left hand and slowly bend wrist forward until stretch is felt. Hold __30__ seconds. Relax. Repeat __2__ times per set. Do _1___ sets per session. Do ___2_ sessions per day.  Copyright  VHI. All rights reserved.  Maria Nunez [phone]

## 2014-04-10 NOTE — Therapy (Signed)
[place], [place], [address] Phone: [phone]   Fax:  [phone]  Physical Therapy Evaluation  Patient Details  Name: ICESIS RENN MRN: [id number] Date of Birth: [birth year] Referring Provider:  Hali Marry, *  Encounter Date: 04/10/2014      PT End of Session - 04/10/14 0851    Visit Number 1   Number of Visits 6   Date for PT Re-Evaluation 05/22/14   PT Start Time 0851   PT Stop Time 0930   PT Time Calculation (min) 39 min      Past Medical History  Diagnosis Date  . Hypertension   . Allergy   . Hidradenitis   . Diabetes mellitus     borderline  . Fibroid   . Heart murmur     Past Surgical History  Procedure Laterality Date  . Cesarean section    . Inguinal hidradenitis excision    . Breast surgery      cysts removed right breast  . Abdominal hysterectomy      supracervical    There were no vitals filed for this visit.  Visit Diagnosis:  Pain, elbow joint, left - Plan: PT plan of care cert/re-cert  Weakness of left hand - Plan: PT plan of care cert/re-cert      Subjective Assessment - 04/10/14 0854    Symptoms Patient reports her concernis mainly her Lt elbow, wrists are doing well with her braces.  She has had two cortisone injections last one in August andhse has a bruise since that injection   Pertinent History has tried a brace at the elbow and it would fall off, not really helped her.    Diagnostic tests none   Patient Stated Goals patient wishes she can lift a glass, open bottles, types all day at work. Pain with picking up her phone.  Would like to lift weights with her arms again without pain   Currently in Pain? Yes   Pain Score 4    Pain Location Elbow   Pain Orientation Left   Pain Descriptors / Indicators Sore   Pain Type Chronic pain   Pain Radiating Towards sometimes pain moves into lower and upper arm   Pain Onset More than a month ago    Pain Frequency Constant   Aggravating Factors  lifting and using the arm makes it worse   Pain Relieving Factors nothing            [facility] PT Assessment - 04/10/14 0001    Assessment   Medical Diagnosis Lt tennis elbow, bilat carpal tunnel   Onset Date 04/09/13   Next MD Visit june   Prior Therapy no   Precautions   Precautions None   Balance Screen   Has the patient fallen in the past 6 months No   Has the patient had a decrease in activity level because of a fear of falling?  No   Is the patient reluctant to leave their home because of a fear of falling?  No   Prior Function   Level of Independence --  was I with all ADLS   Vocation Full time employment   Vocation Requirements typing on computer, minimal lifting   Leisure gardening   Observation/Other Assessments   Focus on Therapeutic Outcomes (FOTO)  42% lmiited   Posture/Postural Control   Posture/Postural Control Postural limitations   Postural Limitations Forward head   ROM / Strength   AROM / PROM /  Strength AROM;Strength   AROM   Overall AROM  Within functional limits for tasks performed   Overall AROM Comments Cervical, bilat UE's    Strength   Overall Strength Comments bilat shoudlers/elbows/wrist WNL   Strength Assessment Site Hand   Right/Left hand Left;Right   Grip (lbs) 47#   Grip (lbs) 25#   Palpation   Palpation tenderness in Lt elbow ECBR insertion area.                    [facility] PT Treatment/Exercise - 04/10/14 0001    Exercises   Exercises Elbow   Elbow Exercises   Wrist Flexion Left  stretching with arm straight 2x30sec   Wrist Extension Left  stretching with elbow straight, 2x30sec   Other elbow exercises grip with towel   Modalities   Modalities Ultrasound   Ultrasound   Ultrasound Location Lt elbow   Ultrasound Parameters 50%, 1.0 w/cm2, 3.3 mhx x 8'   Ultrasound Goals Pain                PT Education - 04/10/14 0934    Education provided Yes    Education Details HEP   Person(s) Educated Patient   Methods Explanation;Demonstration;Handout   Comprehension Returned demonstration             PT Long Term Goals - 04/10/14 0936    PT LONG TERM GOAL #1   Title I with HEP   Time 6   Period Weeks   Status New   PT LONG TERM GOAL #2   Title increase grip Lt hand =/> 35#   Time 6   Period Weeks   Status New   PT LONG TERM GOAL #3   Title improve FOTO =/< 31% limited   Time 6   Period Weeks   Status New   PT LONG TERM GOAL #4   Title decrease pain =/> 75% with daily activities   Time 6   Period Weeks   Status New   PT LONG TERM GOAL #5   Title type and lift at work without increased pain   Time 6   Period Weeks   Status New                Problem List Patient Active Problem List   Diagnosis Date Noted  . Skin lesion of left arm 09/11/2013  . Left lateral epicondylitis 04/05/2013  . Carpal tunnel syndrome, bilateral 04/05/2013  . Tennis elbow 03/24/2013  . LGSIL (low grade squamous intraepithelial dysplasia) 06/16/2012  . Plantar fasciosis 11/26/2011  . Diabetes mellitus without complication 59/74/1638  . PANIC ATTACK 09/07/2008  . RESTLESS LEG SYNDROME 04/02/2008  . POLYARTHRITIS 04/02/2008  . KNEE PAIN, LEFT 01/02/2008  . HOT FLASHES 08/04/2006  . HYPERCHOLESTEROLEMIA 11/03/2005  . HYPERTENSION, BENIGN SYSTEMIC 11/03/2005    Jeral Pinch, PT 04/10/2014, 9:42 AM  [facility] [place], [place], [address] Phone: [phone]   Fax:  [phone]

## 2014-04-12 ENCOUNTER — Ambulatory Visit (INDEPENDENT_AMBULATORY_CARE_PROVIDER_SITE_OTHER): Payer: 59 | Admitting: Obstetrics and Gynecology

## 2014-04-12 ENCOUNTER — Encounter: Payer: Self-pay | Admitting: Obstetrics and Gynecology

## 2014-04-12 ENCOUNTER — Ambulatory Visit (INDEPENDENT_AMBULATORY_CARE_PROVIDER_SITE_OTHER): Payer: 59

## 2014-04-12 VITALS — BP 130/84 | HR 84 | Ht 59.5 in | Wt 172.0 lb

## 2014-04-12 DIAGNOSIS — R102 Pelvic and perineal pain: Secondary | ICD-10-CM

## 2014-04-12 DIAGNOSIS — N949 Unspecified condition associated with female genital organs and menstrual cycle: Secondary | ICD-10-CM

## 2014-04-12 NOTE — Progress Notes (Signed)
Subjective  Patient is here for pelvic ultrasound for lower pelvic cramping.  Cramping is now gone.  Had nausea with it.  Some diarrhea.  Patient was increasing metformin while this was occurring.  Recent treatment of bacterial vaginosis.  Objective  Ultrasound images and report reviewed with patient.  Uterus absent.  Cervix normal.  Right ovary with small follicle.  Left ovary appears attached to right ovary superiorly and is pulled over toward the right.      Assessment.  Pelvic cramping.  Estrogenic activity noted on ultrasound.  Ovarian adhesion.  Recent increase in Metformin.  Recent bacterial vaginosis.   Plan  Observation of pain.  I do not recommend surgical care at this time.  Discussed possibility of oral contraceptives versus surgical care if pain is persistent.  Follow up in June for annual exam.   15 minutes face to face time of which over 50% was spent in counseling.   After visit summary to patient.

## 2014-04-12 NOTE — Patient Instructions (Signed)
Please call if you have recurrent pelvic cramping!

## 2014-04-18 ENCOUNTER — Other Ambulatory Visit: Payer: Self-pay | Admitting: Family Medicine

## 2014-04-19 ENCOUNTER — Ambulatory Visit (INDEPENDENT_AMBULATORY_CARE_PROVIDER_SITE_OTHER): Payer: 59 | Admitting: Physical Therapy

## 2014-04-19 ENCOUNTER — Encounter: Payer: Self-pay | Admitting: Physical Therapy

## 2014-04-19 DIAGNOSIS — R29898 Other symptoms and signs involving the musculoskeletal system: Secondary | ICD-10-CM

## 2014-04-19 DIAGNOSIS — M25522 Pain in left elbow: Secondary | ICD-10-CM

## 2014-04-19 DIAGNOSIS — M6289 Other specified disorders of muscle: Secondary | ICD-10-CM

## 2014-04-19 NOTE — Therapy (Signed)
[place], [place], [address] Phone: [phone]   Fax:  [phone]  Physical Therapy Treatment  Patient Details  Name: Maria Nunez MRN: [id number] Date of Birth: October 13, 1960 Referring Provider:  Hali Marry, *  Encounter Date: 04/19/2014      PT End of Session - 04/19/14 0849    Visit Number 2   Number of Visits 6   Date for PT Re-Evaluation 05/22/14   PT Start Time 0846   PT Stop Time 0928   PT Time Calculation (min) 42 min      Past Medical History  Diagnosis Date  . Hypertension   . Allergy   . Hidradenitis   . Diabetes mellitus     borderline  . Fibroid   . Heart murmur     Past Surgical History  Procedure Laterality Date  . Cesarean section    . Inguinal hidradenitis excision    . Breast surgery      cysts removed right breast  . Abdominal hysterectomy      supracervical    There were no vitals filed for this visit.  Visit Diagnosis:  Pain, elbow joint, left  Weakness of left hand      Subjective Assessment - 04/19/14 0847    Symptoms Pt is doing her HEP, does have pain with the stretches, hasn't noticed any changes.    Currently in Pain? No/denies  has pain at night            [facility] PT Assessment - 04/19/14 0001    Strength   Grip (lbs) 25#                   [facility] Adult PT Treatment/Exercise - 04/19/14 0001    Elbow Exercises   Wrist Flexion Left  2x8 with 2# focus on eccentric   Wrist Extension Left  2x8 with 2# focus on eccentric   Other elbow exercises UBE L1x 2'/2', velcro board, small key, rollor , long handle rollor   Other elbow exercises straight arm wrist flex/ext stretch   Modalities   Modalities Ultrasound   Ultrasound   Ultrasound Location Lt lateral elbow   Ultrasound Parameters 50%, 1.47mz, 3.3 w/cm2   Ultrasound Goals Pain   Manual Therapy   Manual Therapy Myofascial release  cross friction massage, Lt  lateral elbow followed by ice mas                     PT Long Term Goals - 04/19/14 05189   PT LONG TERM GOAL #1   Title I with HEP   Status On-going   PT LONG TERM GOAL #2   Title increase grip Lt hand =/> 35#   Status On-going   PT LONG TERM GOAL #3   Title improve FOTO =/< 31% limited   Status On-going   PT LONG TERM GOAL #4   Title decrease pain =/> 75% with daily activities   Status On-going   PT LONG TERM GOAL #5   Title type and lift at work without increased pain   Status On-going               Plan - 04/19/14 0855    Clinical Impression Statement Only second visit, no goals met and patient hasn't noticed a difference yet.     Pt will benefit from skilled therapeutic intervention in order to improve on the following deficits Decreased strength;Pain;Impaired UE functional use  Rehab Potential Excellent   PT Frequency 2x / week   PT Duration 4 weeks   PT Treatment/Interventions Moist Heat;Therapeutic activities;Patient/family education;Passive range of motion;Therapeutic exercise;Ultrasound;Manual techniques;Cryotherapy;Electrical Stimulation   PT Next Visit Plan progress HEP   Consulted and Agree with Plan of Care Patient        Problem List Patient Active Problem List   Diagnosis Date Noted  . Skin lesion of left arm 09/11/2013  . Left lateral epicondylitis 04/05/2013  . Carpal tunnel syndrome, bilateral 04/05/2013  . Tennis elbow 03/24/2013  . LGSIL (low grade squamous intraepithelial dysplasia) 06/16/2012  . Plantar fasciosis 11/26/2011  . Diabetes mellitus without complication 46/21/9471  . PANIC ATTACK 09/07/2008  . RESTLESS LEG SYNDROME 04/02/2008  . POLYARTHRITIS 04/02/2008  . KNEE PAIN, LEFT 01/02/2008  . HOT FLASHES 08/04/2006  . HYPERCHOLESTEROLEMIA 11/03/2005  . HYPERTENSION, BENIGN SYSTEMIC 11/03/2005    Jeral Pinch, PT 04/19/2014, 9:30 AM  [facility] [place]  [place], [place], [address] Phone: [phone]   Fax:  [phone]

## 2014-04-23 ENCOUNTER — Other Ambulatory Visit: Payer: Self-pay

## 2014-04-23 DIAGNOSIS — Z1231 Encounter for screening mammogram for malignant neoplasm of breast: Secondary | ICD-10-CM

## 2014-04-26 ENCOUNTER — Encounter: Payer: Self-pay | Admitting: Physical Therapy

## 2014-04-26 ENCOUNTER — Ambulatory Visit (INDEPENDENT_AMBULATORY_CARE_PROVIDER_SITE_OTHER): Payer: 59 | Admitting: Physical Therapy

## 2014-04-26 DIAGNOSIS — M25522 Pain in left elbow: Secondary | ICD-10-CM | POA: Diagnosis not present

## 2014-04-26 DIAGNOSIS — M6289 Other specified disorders of muscle: Secondary | ICD-10-CM

## 2014-04-26 DIAGNOSIS — R29898 Other symptoms and signs involving the musculoskeletal system: Secondary | ICD-10-CM

## 2014-04-26 NOTE — Therapy (Signed)
[place], [place], [address] Phone: [phone]   Fax:  [phone]  Physical Therapy Treatment  Patient Details  Name: Maria Nunez MRN: [id number] Date of Birth: 07-Dec-1960 Referring Provider:  Hali Marry, *  Encounter Date: 04/26/2014      PT End of Session - 04/26/14 0806    Visit Number 3   Number of Visits 6   Date for PT Re-Evaluation 05/22/14   PT Start Time 0801   PT Stop Time 0859   PT Time Calculation (min) 58 min   Activity Tolerance Patient tolerated treatment well      Past Medical History  Diagnosis Date  . Hypertension   . Allergy   . Hidradenitis   . Diabetes mellitus     borderline  . Fibroid   . Heart murmur     Past Surgical History  Procedure Laterality Date  . Cesarean section    . Inguinal hidradenitis excision    . Breast surgery      cysts removed right breast  . Abdominal hysterectomy      supracervical    There were no vitals filed for this visit.  Visit Diagnosis:  Pain, elbow joint, left  Weakness of left hand      Subjective Assessment - 04/26/14 0803    Symptoms Pt reports she is tired, Her Lt elbow hurts and her Rt hand is numb.  This happens sometimes. Had to take some advil after the last treatment to settle down her arm.    Currently in Pain? No/denies  only has pain with lifitng and using the Lt arm.             [facility] PT Assessment - 04/26/14 0001    Strength   Grip (lbs) 23#  with pain in forearm                   [facility] Adult PT Treatment/Exercise - 04/26/14 0001    Elbow Exercises   Elbow Flexion Left  3x8   Bar Weights/Barbell (Elbow Flexion) 3 lbs   Elbow Extension Left;Theraband  3x8   Theraband Level (Elbow Extension) Level 2 (Red)   Other elbow exercises UBE L1x 2'/2', velcro board, small key, rollor , long handle rollor   Other elbow exercises velcro rolllor board x 6 reps long handle  and gripper, med ball for grip and finger extension    Modalities   Modalities Ultrasound;Iontophoresis;Moist Heat   Moist Heat Therapy   Number Minutes Moist Heat 15 Minutes   Moist Heat Location --  Lt forearm   Ultrasound   Ultrasound Location Lt lateral elbow   Ultrasound Parameters 50%, 3.30mhx, 1.0 w/cm2   Ultrasound Goals Pain   Iontophoresis   Type of Iontophoresis Dexamethasone   Location Lt ECBR origin   Dose 1.0cc   Time 6 hr patch stat   Manual Therapy   Manual Therapy Joint mobilization;Myofascial release   Joint Mobilization Lt radial/ulnar mobs distal grade III, thrid finger Lt MCP jt mobs for flexion.   Myofascial Release STW and passive release to Lt forearm.                 PT Education - 04/26/14 0849    Education provided Yes   Education Details massage and heat   Person(s) Educated Patient   Methods Explanation   Comprehension Verbalized understanding             PT Long  Term Goals - 04/26/14 0806    PT LONG TERM GOAL #1   Title I with HEP   Status On-going   PT LONG TERM GOAL #2   Title increase grip Lt hand =/> 35#   Status On-going   PT LONG TERM GOAL #3   Title improve FOTO =/< 31% limited   Status On-going   PT LONG TERM GOAL #4   Title decrease pain =/> 75% with daily activities   Status On-going   PT LONG TERM GOAL #5   Title type and lift at work without increased pain   Status On-going               Plan - 04/26/14 0849    Clinical Impression Statement Pt's Lt forearm in very tight, her muscles are tender to palpation, the pain in the lateral elbow has localized over the [facility] origin.  Will add iontophoresis to treatment plan.    Pt will benefit from skilled therapeutic intervention in order to improve on the following deficits Decreased strength;Pain;Impaired UE functional use   Rehab Potential Excellent   PT Frequency 2x / week   PT Duration 4 weeks   PT Treatment/Interventions Moist Heat;Therapeutic  activities;Patient/family education;Passive range of motion;Therapeutic exercise;Ultrasound;Manual techniques;Cryotherapy;Electrical Stimulation  iontophoresis 40mg .ml    Consulted and Agree with Plan of Care Patient        Problem List Patient Active Problem List   Diagnosis Date Noted  . Skin lesion of left arm 09/11/2013  . Left lateral epicondylitis 04/05/2013  . Carpal tunnel syndrome, bilateral 04/05/2013  . Tennis elbow 03/24/2013  . LGSIL (low grade squamous intraepithelial dysplasia) 06/16/2012  . Plantar fasciosis 11/26/2011  . Diabetes mellitus without complication 74/25/9563  . PANIC ATTACK 09/07/2008  . RESTLESS LEG SYNDROME 04/02/2008  . POLYARTHRITIS 04/02/2008  . KNEE PAIN, LEFT 01/02/2008  . HOT FLASHES 08/04/2006  . HYPERCHOLESTEROLEMIA 11/03/2005  . HYPERTENSION, BENIGN SYSTEMIC 11/03/2005    Jeral Pinch, PT 04/26/2014, 8:52 AM  [facility] [place], [place], [address] Phone: [phone]   Fax:  [phone]

## 2014-04-26 NOTE — Patient Instructions (Signed)
Add heat to Lt forearm and STW x15 min a day

## 2014-05-03 ENCOUNTER — Ambulatory Visit (INDEPENDENT_AMBULATORY_CARE_PROVIDER_SITE_OTHER): Payer: 59 | Admitting: Physical Therapy

## 2014-05-03 DIAGNOSIS — R29898 Other symptoms and signs involving the musculoskeletal system: Secondary | ICD-10-CM

## 2014-05-03 DIAGNOSIS — M6289 Other specified disorders of muscle: Secondary | ICD-10-CM

## 2014-05-03 DIAGNOSIS — M25522 Pain in left elbow: Secondary | ICD-10-CM

## 2014-05-03 NOTE — Patient Instructions (Signed)
Wrist Flexion: Resisted   With right palm up, __1-2__ pound weight in hand, bend wrist up. Return slowly. Repeat _10___ times per set. Do __2__ sets per session. Do _1___ sessions every other day.  Copyright  VHI. All rights reserved.  Wrist Extension: Resisted   With right palm down, __1-2__ pound weight in hand, bend wrist up. Return slowly. Repeat _10___ times per set. Do __2__ sets per session. Do __1__ sessions every other day.  Copyright  VHI. All rights reserved.  Forearm Pronation / Supination: Resisted (Sitting)   With right forearm supported, grasp object and gently rotate palm up, then down, as far as possible without pain. Repeat _10___ times per set. Do __2__ sets per session. Do __1__ sessions every other  day.  Copyright  VHI. All rights reserved.   [facility] at [facility] [place], [place] [address]  [phone] (office) [phone] (fax)

## 2014-05-03 NOTE — Therapy (Signed)
[place], [place], [address] Phone: [phone]   Fax:  [phone]  Physical Therapy Treatment  Patient Details  Name: Maria Nunez MRN: [id number] Date of Birth: 1960-06-25 Referring Provider:  Hali Marry, *  Encounter Date: 05/03/2014      PT End of Session - 05/03/14 0848    Visit Number 4   Number of Visits 6   Date for PT Re-Evaluation 05/22/14   PT Start Time 0801   PT Stop Time 4235   PT Time Calculation (min) 46 min   Activity Tolerance Patient tolerated treatment well      Past Medical History  Diagnosis Date  . Hypertension   . Allergy   . Hidradenitis   . Diabetes mellitus     borderline  . Fibroid   . Heart murmur     Past Surgical History  Procedure Laterality Date  . Cesarean section    . Inguinal hidradenitis excision    . Breast surgery      cysts removed right breast  . Abdominal hysterectomy      supracervical    There were no vitals filed for this visit.  Visit Diagnosis:  Pain, elbow joint, left  Weakness of left hand      Subjective Assessment - 05/03/14 0806    Currently in Pain? Yes   Pain Score 3    Pain Location Elbow   Pain Orientation Left   Pain Descriptors / Indicators Nagging;Constant   Aggravating Factors  lifting   Pain Relieving Factors advil             [facility] PT Assessment - 05/03/14 0001    Assessment   Medical Diagnosis Lt tennis elbow, bilat carpal tunnel   Onset Date 04/09/13   Next MD Visit june   Precautions   Precautions None   Strength   Grip (lbs) 30#                   [facility] Adult PT Treatment/Exercise - 05/03/14 0001    Exercises   Exercises Elbow   Elbow Exercises   Wrist Flexion Left;Strengthening;10 reps  2 sets    Bar Weights/Barbell (Wrist Flexion) 2 lbs   Wrist Extension Left;Strengthening;10 reps  2 sets   Bar Weights/Barbell (Wrist Extension) 2 lbs   Other elbow  exercises UBE, L1.5 2'/2'   Other elbow exercises med grip ball and finger extension x 20 each;  pronation/supination with 2# weight. (3# too heavy)   Iontophoresis   Type of Iontophoresis Dexamethasone   Location Lt lateral epicondyle   Dose 1.0cc   Time 6 hr patch stat   Manual Therapy   Manual Therapy Myofascial release;Other (comment)   Myofascial Release to wrist extensors    Other Manual Therapy TRP to wrist extensors over length of muscle with contract/relax, muscle stripping wrist flexors and extensors as well as distal humerus attachments.                PT Education - 05/03/14 1015    Education provided Yes   Education Details HEP, massage to forearm and ice massage to lateral epicondyle   Person(s) Educated Patient   Methods Explanation;Demonstration;Handout   Comprehension Verbalized understanding;Returned demonstration             PT Long Term Goals - 04/26/14 0806    PT LONG TERM GOAL #1   Title I with HEP   Status On-going   PT  LONG TERM GOAL #2   Title increase grip Lt hand =/> 35#   Status On-going   PT LONG TERM GOAL #3   Title improve FOTO =/< 31% limited   Status On-going   PT LONG TERM GOAL #4   Title decrease pain =/> 75% with daily activities   Status On-going   PT LONG TERM GOAL #5   Title type and lift at work without increased pain   Status On-going               Plan - 05/03/14 1039    Clinical Impression Statement Pt demo improved grip strength. Pt tolerated all exercises with minimal increase in pain. Progressing towards goals.    Pt will benefit from skilled therapeutic intervention in order to improve on the following deficits Decreased strength;Pain;Impaired UE functional use   Rehab Potential Excellent   PT Frequency 2x / week   PT Duration 4 weeks   PT Treatment/Interventions Moist Heat;Therapeutic activities;Patient/family education;Passive range of motion;Therapeutic exercise;Ultrasound;Manual  techniques;Cryotherapy;Electrical Stimulation   PT Next Visit Plan Continue progressive strengthening for LUE. Continue manual therapy to L wrist extensors/flexors.    Consulted and Agree with Plan of Care Patient        Problem List Patient Active Problem List   Diagnosis Date Noted  . Skin lesion of left arm 09/11/2013  . Left lateral epicondylitis 04/05/2013  . Carpal tunnel syndrome, bilateral 04/05/2013  . Tennis elbow 03/24/2013  . LGSIL (low grade squamous intraepithelial dysplasia) 06/16/2012  . Plantar fasciosis 11/26/2011  . Diabetes mellitus without complication 85/88/5027  . PANIC ATTACK 09/07/2008  . RESTLESS LEG SYNDROME 04/02/2008  . POLYARTHRITIS 04/02/2008  . KNEE PAIN, LEFT 01/02/2008  . HOT FLASHES 08/04/2006  . HYPERCHOLESTEROLEMIA 11/03/2005  . HYPERTENSION, BENIGN SYSTEMIC 11/03/2005   Kerin Perna, PTA 05/03/2014 10:44 AM  [place], [place], [address] Phone: [phone]   Fax:  [phone]

## 2014-05-04 ENCOUNTER — Encounter: Payer: Self-pay | Admitting: Obstetrics and Gynecology

## 2014-05-09 ENCOUNTER — Other Ambulatory Visit: Payer: Self-pay | Admitting: Obstetrics and Gynecology

## 2014-05-09 MED ORDER — METRONIDAZOLE 500 MG PO TABS: 500.0000 mg | ORAL_TABLET | Freq: Two times a day (BID) | ORAL | Status: DC

## 2014-05-10 ENCOUNTER — Encounter: Payer: 59 | Admitting: Physical Therapy

## 2014-05-18 ENCOUNTER — Ambulatory Visit (INDEPENDENT_AMBULATORY_CARE_PROVIDER_SITE_OTHER): Payer: 59 | Admitting: Physical Therapy

## 2014-05-18 DIAGNOSIS — M25522 Pain in left elbow: Secondary | ICD-10-CM | POA: Diagnosis not present

## 2014-05-18 DIAGNOSIS — M6289 Other specified disorders of muscle: Secondary | ICD-10-CM | POA: Diagnosis not present

## 2014-05-18 DIAGNOSIS — R29898 Other symptoms and signs involving the musculoskeletal system: Secondary | ICD-10-CM

## 2014-05-18 NOTE — Therapy (Signed)
[place], [place], [address] Phone: [phone]   Fax:  [phone]  Physical Therapy Treatment  Patient Details  Name: Maria Nunez MRN: [id number] Date of Birth: 09-Sep-1960 Referring Provider:  Hali Marry, *  Encounter Date: 05/18/2014      PT End of Session - 05/18/14 0804    Visit Number 5   Number of Visits 6   Date for PT Re-Evaluation 05/22/14   PT Start Time 0803   PT Stop Time 6789   PT Time Calculation (min) 44 min   Activity Tolerance Patient tolerated treatment well      Past Medical History  Diagnosis Date  . Hypertension   . Allergy   . Hidradenitis   . Diabetes mellitus     borderline  . Fibroid   . Heart murmur     Past Surgical History  Procedure Laterality Date  . Cesarean section    . Inguinal hidradenitis excision    . Breast surgery      cysts removed right breast  . Abdominal hysterectomy      supracervical    There were no vitals filed for this visit.  Visit Diagnosis:  Pain, elbow joint, left  Weakness of left hand      Subjective Assessment - 05/18/14 0807    Subjective Pt voiced frustration with lingering symptoms in Lt elbow.  Pt reported elbow worsened with work related tasks to moving to new office (packing/lifting boxes).    Currently in Pain? No/denies  0/10 at rest.  5/10 with movement            [facility] PT Assessment - 05/18/14 0001    Assessment   Medical Diagnosis Lt tennis elbow, bilat carpal tunnel   Onset Date 04/09/13   Next MD Visit june   Strength   Grip (lbs) 40# Rt hand    Grip (lbs) 30# Lt hand         [facility] Adult PT Treatment/Exercise - 05/18/14 0001    Exercises   Exercises Elbow   Elbow Exercises   Wrist Flexion Left;10 reps   Bar Weights/Barbell (Wrist Flexion) 2 lbs   Wrist Extension Left;Strengthening;10 reps   Bar Weights/Barbell (Wrist Extension) 2 lbs   Other elbow exercises UBE, L1.5  2'/2'   Other elbow exercises soft grip ball squeezes and finger ext x 20;  pronation/supination with 2# wt x 10 reps each direction    Modalities   Modalities Ultrasound   Ultrasound   Ultrasound Location Lt Lateral elbow    Ultrasound Parameters 50%. 3.3 mHz, 1.1 w/cm2.x 8 min   Ultrasound Goals Edema;Pain   Manual Therapy   Manual Therapy Other (comment);Myofascial release   Myofascial Release to Lt wrist flexors and extensor    Other Manual Therapy Ktape applied from Lt wrist to elbow to decrease pain and swelling, assist L wrist extensors.             PT Long Term Goals - 04/26/14 0806    PT LONG TERM GOAL #1   Title I with HEP   Status On-going   PT LONG TERM GOAL #2   Title increase grip Lt hand =/> 35#   Status On-going   PT LONG TERM GOAL #3   Title improve FOTO =/< 31% limited   Status On-going   PT LONG TERM GOAL #4   Title decrease pain =/> 75% with daily activities   Status On-going   PT  LONG TERM GOAL #5   Title type and lift at work without increased pain   Status On-going               Plan - 05/18/14 0857    Clinical Impression Statement Pt demo decreased grip strength on Rt hand, unchanged with Lt hand.  Pt's Lt wrist flexors tight and tender with manual therapy.  Pt noted decrease in Lt elbow pain with movement after session.    Pt will benefit from skilled therapeutic intervention in order to improve on the following deficits Decreased strength;Pain;Impaired UE functional use   Rehab Potential Excellent   PT Frequency 2x / week   PT Duration 4 weeks   PT Next Visit Plan Assess effectiveness of ktape, assess need for continuation of therapy vs D/C.    Consulted and Agree with Plan of Care Patient        Problem List Patient Active Problem List   Diagnosis Date Noted  . Skin lesion of left arm 09/11/2013  . Left lateral epicondylitis 04/05/2013  . Carpal tunnel syndrome, bilateral 04/05/2013  . Tennis elbow 03/24/2013  . LGSIL (low  grade squamous intraepithelial dysplasia) 06/16/2012  . Plantar fasciosis 11/26/2011  . Diabetes mellitus without complication 07/37/1062  . PANIC ATTACK 09/07/2008  . RESTLESS LEG SYNDROME 04/02/2008  . POLYARTHRITIS 04/02/2008  . KNEE PAIN, LEFT 01/02/2008  . HOT FLASHES 08/04/2006  . HYPERCHOLESTEROLEMIA 11/03/2005  . HYPERTENSION, BENIGN SYSTEMIC 11/03/2005    Kerin Perna, PTA 05/18/2014 10:10 AM  [place], [place], [address] Phone: [phone]   Fax:  [phone]

## 2014-05-21 ENCOUNTER — Ambulatory Visit: Payer: 59 | Admitting: Pharmacist

## 2014-05-21 ENCOUNTER — Ambulatory Visit (INDEPENDENT_AMBULATORY_CARE_PROVIDER_SITE_OTHER): Payer: Self-pay | Admitting: Family Medicine

## 2014-05-21 VITALS — BP 128/70 | Ht 59.0 in | Wt 171.4 lb

## 2014-05-21 DIAGNOSIS — E119 Type 2 diabetes mellitus without complications: Secondary | ICD-10-CM

## 2014-05-21 NOTE — Progress Notes (Signed)
Subjective:  Patient presents today for 3 month diabetes follow-up as part of the employer-sponsored [employer] to Well[employer] program.  Current diabetes regimen includes metformin 1000 mg AM and 500 mg PM;   Patient also continues on daily ASA, ARB and statin.  Most recent MD follow-up was with Dr. Suzi Roots in March. A1C at this point was 6.9%.  Patient has a pending appt for June.  No med changes or major health changes at this time. Weight is down 3 pounds since her last visit with me.   Refilled medications for patient today.   Patient has been attending physical therapy for a permanent bruise on her left elbow following cortisone injections. She reports that her elbow is still stiff and difficult to move. Her next PT appointment is on Thursday.   Assessment/Plan:  Patient is a 54 yo female with DM 2. Most recent A1C was   6.9% which is at goal of less than 7%. Weight is /decreased from last visit with me by 3 pounds. Patient attributes this to increased exercise and avoiding concentrated sweets.   CBG Review: Patient did not bring meter with her to this appointment. Denies hypoglycemia. She is checking once daily and states fasting is usually running around 113.   Lifestyle improvements:  Physical Activity-   Patient continues to exercise twice a week. Previously she was going to the gym three or four times a week but because of the pain in her arm she hasn't been able to    Nutrition-  Patient states she has been doing better and controlling portion sizes. She also has stopped buying cookies from the store and she thinks this has helped with her weight loss.    Follow up with me in 3 months.    Goals for Next Visit:  1. Continue weight loss. Keep going to exercise at the gym for at least 30 minutes. Aim to go at least three times a week.  2. Continue to avoid purchasing sweet snacks (cookies, cupcakes).     Next appointment to see me is: Monday July 25th at 8 AM.    T[place].  Donneta Romberg, PharmD, BCPS, CDE [facility] [place] Coordinator [phone]

## 2014-05-24 ENCOUNTER — Ambulatory Visit (INDEPENDENT_AMBULATORY_CARE_PROVIDER_SITE_OTHER): Payer: 59 | Admitting: Physical Therapy

## 2014-05-24 DIAGNOSIS — M6289 Other specified disorders of muscle: Secondary | ICD-10-CM | POA: Diagnosis not present

## 2014-05-24 DIAGNOSIS — M25522 Pain in left elbow: Secondary | ICD-10-CM

## 2014-05-24 DIAGNOSIS — R29898 Other symptoms and signs involving the musculoskeletal system: Secondary | ICD-10-CM

## 2014-05-24 NOTE — Therapy (Signed)
[place], [place], [address] Phone: [phone]   Fax:  [phone]  Physical Therapy Treatment  Patient Details  Name: Maria Nunez MRN: [id number] Date of Birth: May 21, 1960 Referring Provider:  Hali Marry, *  Encounter Date: 05/24/2014      PT End of Session - 05/24/14 0811    Visit Number 6   Number of Visits 14   Date for PT Re-Evaluation 06/21/14   PT Start Time 0809   PT Stop Time 6967   PT Time Calculation (min) 43 min   Activity Tolerance Patient limited by pain;Patient tolerated treatment well      Past Medical History  Diagnosis Date  . Hypertension   . Allergy   . Hidradenitis   . Diabetes mellitus     borderline  . Fibroid   . Heart murmur     Past Surgical History  Procedure Laterality Date  . Cesarean section    . Inguinal hidradenitis excision    . Breast surgery      cysts removed right breast  . Abdominal hysterectomy      supracervical    There were no vitals filed for this visit.  Visit Diagnosis:  Pain, elbow joint, left - Plan: PT plan of care cert/re-cert  Weakness of left hand - Plan: PT plan of care cert/re-cert          [facility] PT Assessment - 05/24/14 0001    Assessment   Medical Diagnosis Lt tennis elbow, bilat carpal tunnel   Onset Date 04/09/13   Strength   Strength Assessment Site Elbow;Wrist   Right/Left Elbow Left   Left Elbow Flexion 5/5   Left Elbow Extension 5/5  with pain    Right/Left Wrist Left   Left Wrist Flexion 4+/5   Left Wrist Extension 4+/5  with pain    Grip (lbs) 40#   Grip (lbs) 26#                     [facility] Adult PT Treatment/Exercise - 05/24/14 0001    Exercises   Exercises Wrist;Elbow   Elbow Exercises   Elbow Flexion Left;10 reps  AROM followed by strengthening with wt. x 10 reps each   Bar Weights/Barbell (Elbow Flexion) 3 lbs   Wrist Flexion Left;10 reps  AROM followed by  strengthening with wt. x 10 reps each   Bar Weights/Barbell (Wrist Flexion) --  1# 1st set, 2# 2nd set   Wrist Extension Left;10 reps  AROM followed by strengthening with wt. x 10 reps each   Bar Weights/Barbell (Wrist Extension) --  1#, then 2# -2nd set   Other elbow exercises wrist circles and elbow flex for warm up    Other elbow exercises Wrist ext and flex stretches 30 sec x  2 reps x 2 sets; tricep stretch LUE x 30 sec x 2 reps.    Ultrasound   Ultrasound Location Lt lateral elbow (distal humerus/prox radius   Ultrasound Parameters 50%, 3.3 mHz, 1.1 w/cm2, x 8 min    Ultrasound Goals Edema;Pain   Iontophoresis   Type of Iontophoresis Dexamethasone   Location Lt lateral epicondyle   Dose 1.0 cc    Time 6 hr patch    Manual Therapy   Manual Therapy Myofascial release   Myofascial Release Lt wrist flexors/ extensors                 PT Education - 05/24/14 8938  Education provided Yes   Education Details Pt to perform ice massage to elbow 2x/day and self massage to same area.    Person(s) Educated Patient   Methods Explanation;Demonstration   Comprehension Verbalized understanding             PT Long Term Goals - 05/24/14 0909    PT LONG TERM GOAL #1   Title I with HEP   Period Weeks   Status On-going   PT LONG TERM GOAL #2   Title increase grip Lt hand =/> 35#   Time 6   Period Weeks   Status On-going   PT LONG TERM GOAL #3   Title improve FOTO =/< 31% limited   Time 6   Period Weeks   Status On-going   PT LONG TERM GOAL #4   Title decrease pain =/> 75% with daily activities   Time 6   Period Weeks   Status On-going   PT LONG TERM GOAL #5   Title type and lift at work without increased pain   Time 6   Period Weeks   Status On-going               Plan - 05/24/14 0913    Clinical Impression Statement Pt demo decreased grip strength in Lt hand compared to last session.  Pt reported pain with Lt elbow ext and wrist extension with MMT.  Pt tolerated ther ex with mild pain during motion, decreased with rest.  Pt did report some relief of symptoms with kinesiotape to arm. Patients treatments limited due to 1x/wk visits. Will extend out to include more visits and she will try to come 2x.wk to get ionto and better response   Pt will benefit from skilled therapeutic intervention in order to improve on the following deficits Decreased strength;Pain;Impaired UE functional use   Rehab Potential Excellent   PT Treatment/Interventions Moist Heat;Therapeutic activities;Patient/family education;Passive range of motion;Therapeutic exercise;Ultrasound;Manual techniques;Cryotherapy;Electrical Stimulation   PT Next Visit Plan Reapply tape to Lt forearm next day(as pt left with ionto patch to affected area). Discussed with supervising PT regarding progress and pt's desire to continue therapy for improved LUE function and decreased elbow pain.  Complete FOTO next visit.   Consulted and Agree with Plan of Care Patient        Problem List Patient Active Problem List   Diagnosis Date Noted  . Skin lesion of left arm 09/11/2013  . Left lateral epicondylitis 04/05/2013  . Carpal tunnel syndrome, bilateral 04/05/2013  . Tennis elbow 03/24/2013  . LGSIL (low grade squamous intraepithelial dysplasia) 06/16/2012  . Plantar fasciosis 11/26/2011  . Diabetes mellitus without complication 62/56/3893  . PANIC ATTACK 09/07/2008  . RESTLESS LEG SYNDROME 04/02/2008  . POLYARTHRITIS 04/02/2008  . KNEE PAIN, LEFT 01/02/2008  . HOT FLASHES 08/04/2006  . HYPERCHOLESTEROLEMIA 11/03/2005  . HYPERTENSION, BENIGN SYSTEMIC 11/03/2005   Kerin Perna, PTA 05/24/2014 4:10 PM  [facility] [place], [place], [address] Phone: [phone]   Fax:  [phone]   Jeral Pinch, PT 05/24/2014 4:11 PM

## 2014-05-25 LAB — BASIC METABOLIC PANEL WITH GFR
BUN: 15 mg/dL (ref 6–23)
CO2: 29 mEq/L (ref 19–32)
Calcium: 9.8 mg/dL (ref 8.4–10.5)
Chloride: 100 mEq/L (ref 96–112)
Creat: 0.58 mg/dL (ref 0.50–1.10)
GFR, Est African American: >89 mL/min
GFR, Est Non African American: >89 mL/min
Glucose, Bld: 118 mg/dL — ABNORMAL HIGH (ref 70–99)
Potassium: 3.9 mEq/L (ref 3.5–5.3)
Sodium: 139 mEq/L (ref 135–145)

## 2014-05-25 NOTE — Progress Notes (Signed)
Quick Note:  All labs are normal. ______ 

## 2014-05-28 ENCOUNTER — Ambulatory Visit (INDEPENDENT_AMBULATORY_CARE_PROVIDER_SITE_OTHER): Payer: 59 | Admitting: Physical Therapy

## 2014-05-28 DIAGNOSIS — M25522 Pain in left elbow: Secondary | ICD-10-CM

## 2014-05-28 DIAGNOSIS — R29898 Other symptoms and signs involving the musculoskeletal system: Secondary | ICD-10-CM

## 2014-05-28 DIAGNOSIS — M6289 Other specified disorders of muscle: Secondary | ICD-10-CM

## 2014-05-28 NOTE — Therapy (Signed)
[place], [place], [address] Phone: [phone]   Fax:  [phone]  Physical Therapy Treatment  Patient Details  Name: Maria Nunez MRN: [id number] Date of Birth: Jul 15, 1960 Referring Provider:  Hali Marry, *  Encounter Date: 05/28/2014      PT End of Session - 05/28/14 0743    Visit Number 7   Number of Visits 14   Date for PT Re-Evaluation 06/21/14   PT Start Time 0735   PT Stop Time 0802   PT Time Calculation (min) 27 min      Past Medical History  Diagnosis Date  . Hypertension   . Allergy   . Hidradenitis   . Diabetes mellitus     borderline  . Fibroid   . Heart murmur     Past Surgical History  Procedure Laterality Date  . Cesarean section    . Inguinal hidradenitis excision    . Breast surgery      cysts removed right breast  . Abdominal hysterectomy      supracervical    There were no vitals filed for this visit.  Visit Diagnosis:  Weakness of left hand  Pain, elbow joint, left      Subjective Assessment - 05/28/14 0739    Subjective Pt presents with skin irritation on inside of Lt elbow (pattern of ktape adhesive).  Pt reports no change since last visit. Has been performing wrist flex/ext with 2# wt at home 2x/day and massaging lat epicondyle.    Currently in Pain? No/denies  Painfree at rest; 3/10 with movement.    Pain Location Elbow   Pain Orientation Left            [facility] PT Assessment - 05/28/14 0001    Assessment   Medical Diagnosis Lt tennis elbow, bilat carpal tunnel   Onset Date 04/09/13   Observation/Other Assessments   Focus on Therapeutic Outcomes (FOTO)  35% limited                     [facility] Adult PT Treatment/Exercise - 05/28/14 0001    Exercises   Exercises Elbow;Wrist   Elbow Exercises   Elbow Flexion Strengthening;Left;10 reps x 2 sets    Bar Weights/Barbell (Elbow Flexion) 3 lbs;4 lbs   Forearm  Supination Left;10 reps  with 2#   Forearm Pronation Left;10 reps  2#   Wrist Flexion Strengthening;Left;10 reps  2 sets   Bar Weights/Barbell (Wrist Flexion) 2 lbs   Wrist Extension Strengthening;Left;10 reps  2 sets   Bar Weights/Barbell (Wrist Extension) 2 lbs   Other elbow exercises wrist flex/ext stretches 30 sec x 2 sets. Tricep stretch (Lt) 30 sec x 2 reps      Iontophoresis   Type of Iontophoresis Dexamethasone   Location Distal humerus/Lt lateral epicondyle   Dose 1.3 cc    Time 14 hr patch    Manual Therapy   Manual Therapy Myofascial release;Joint mobilization   Joint Mobilization Lt radial/ulnar mobs distal grade III   Myofascial Release Lt wrist flexors/ extensors, TPR to same area.                      PT Long Term Goals - 05/24/14 0909    PT LONG TERM GOAL #1   Title I with HEP   Period Weeks   Status On-going   PT LONG TERM GOAL #2   Title increase grip Lt hand =/> 35#  Time 6   Period Weeks   Status On-going   PT LONG TERM GOAL #3   Title improve FOTO =/< 31% limited   Time 6   Period Weeks   Status On-going   PT LONG TERM GOAL #4   Title decrease pain =/> 75% with daily activities   Time 6   Period Weeks   Status On-going   PT LONG TERM GOAL #5   Title type and lift at work without increased pain   Time 6   Period Weeks   Status On-going               Plan - 05/28/14 0912    Clinical Impression Statement Pt reported some relief with kinesiotape over weekend, although had some skin irritation on inside of elbow after removal. Pt tolerated all exercises well, minimum increase in pain. ( 2nd ionto application. ) Pt had improved FOTO score to 35% limited (goal is 31%)   Pt will benefit from skilled therapeutic intervention in order to improve on the following deficits Decreased strength;Pain;Impaired UE functional use   Rehab Potential Excellent   PT Frequency 2x / week   PT Duration 4 weeks   PT Treatment/Interventions  Moist Heat;Therapeutic activities;Patient/family education;Passive range of motion;Therapeutic exercise;Ultrasound;Manual techniques;Cryotherapy;Electrical Stimulation   PT Next Visit Plan Continue per plan of care; include [place] next visit prior to ionto.    Consulted and Agree with Plan of Care Patient        Problem List Patient Active Problem List   Diagnosis Date Noted  . Skin lesion of left arm 09/11/2013  . Left lateral epicondylitis 04/05/2013  . Carpal tunnel syndrome, bilateral 04/05/2013  . Tennis elbow 03/24/2013  . LGSIL (low grade squamous intraepithelial dysplasia) 06/16/2012  . Plantar fasciosis 11/26/2011  . Diabetes mellitus without complication 88/91/6945  . PANIC ATTACK 09/07/2008  . RESTLESS LEG SYNDROME 04/02/2008  . POLYARTHRITIS 04/02/2008  . KNEE PAIN, LEFT 01/02/2008  . HOT FLASHES 08/04/2006  . HYPERCHOLESTEROLEMIA 11/03/2005  . HYPERTENSION, BENIGN SYSTEMIC 11/03/2005   Kerin Perna, PTA 05/28/2014 9:21 AM  [place], [place], [address] Phone: [phone]   Fax:  [phone]

## 2014-05-29 NOTE — Progress Notes (Signed)
Patient ID: Maria Nunez, female   DOB: 24-Apr-1960, 53 y.o.   MRN: [id number] ATTENDING PHYSICIAN NOTE: I have reviewed the chart and agree with the plan as detailed above. Dorcas Mcmurray MD Pager [phone]

## 2014-05-30 ENCOUNTER — Ambulatory Visit (INDEPENDENT_AMBULATORY_CARE_PROVIDER_SITE_OTHER): Payer: 59 | Admitting: Physical Therapy

## 2014-05-30 DIAGNOSIS — R29898 Other symptoms and signs involving the musculoskeletal system: Secondary | ICD-10-CM

## 2014-05-30 DIAGNOSIS — M25522 Pain in left elbow: Secondary | ICD-10-CM | POA: Diagnosis not present

## 2014-05-30 DIAGNOSIS — M6289 Other specified disorders of muscle: Secondary | ICD-10-CM | POA: Diagnosis not present

## 2014-05-30 NOTE — Patient Instructions (Signed)
Extension (Resistive Band)   With band looped around hand and wrist, use elbow movements only. Using other arm as anchor, straighten elbow, pushing down. Hold _0___ seconds. Repeat __10__ times, 2 sets.  Do _1___ sessions per day, 3 x/wk.  Copyright  VHI. All rights reserved.  Bicep: Reverse Curl (Eccentric), (Resistance Band)   Raise affected arm, palm down, holding resistance band until elbow is bent to 100. Turn palm up. Lower slowly 3-5 seconds. Use __yellow______ resistance band. _10_ reps per set, __2_ sets per session, __3_ days per week.  Copyright  VHI. All rights reserved.  FLEXION: Standing - Stable: Resistance Band (Active)   Stand with right arm at side. Against yellow resistance band, lift arm forward and up to 90 degrees , keeping elbow straight. Complete _2__ sets of __10_ repetitions. Perform _1__ sessions per day, 3 x /wk.  Copyright  VHI. All rights reserved.  / [facility] [place], [place] [address]  [phone] (office) [phone] (fax)

## 2014-05-30 NOTE — Therapy (Signed)
[place], [place], [address] Phone: [phone]   Fax:  [phone]  Physical Therapy Treatment  Patient Details  Name: Maria Nunez MRN: [id number] Date of Birth: [birth year] Referring Provider:  Hali Marry, *  Encounter Date: 05/30/2014      PT End of Session - 05/30/14 0731    Visit Number 8   Number of Visits 14   Date for PT Re-Evaluation 06/21/14   PT Start Time 0732   PT Stop Time 0820   PT Time Calculation (min) 48 min   Activity Tolerance Patient tolerated treatment well      Past Medical History  Diagnosis Date  . Hypertension   . Allergy   . Hidradenitis   . Diabetes mellitus     borderline  . Fibroid   . Heart murmur     Past Surgical History  Procedure Laterality Date  . Cesarean section    . Inguinal hidradenitis excision    . Breast surgery      cysts removed right breast  . Abdominal hysterectomy      supracervical    There were no vitals filed for this visit.  Visit Diagnosis:  Weakness of left hand  Pain, elbow joint, left      Subjective Assessment - 05/30/14 0738    Subjective Pt reports elbow feels stiff after holding phone to ear on with Lt hand. Reports 3/10 pain when lifting packages of paper and is still unable to lay on Lt side due to pain.    Currently in Pain? No/denies  with movement, 3/10. Achy and stiff   Pain Location Elbow   Pain Orientation Left            [facility] PT Assessment - 05/30/14 0001    Assessment   Medical Diagnosis Lt tennis elbow, bilat carpal tunnel   Onset Date 04/09/13   Strength   Grip (lbs) 42.5#   Grip (lbs) 32.5#                     [facility] Adult PT Treatment/Exercise - 05/30/14 0001    Exercises   Exercises Shoulder;Elbow;Wrist   Elbow Exercises   Elbow Flexion Strengthening;Left;10 reps  2 sets   Theraband Level (Elbow Flexion) Level 1 (Yellow)   Elbow Extension Left;5 reps    Theraband Level (Elbow Extension) Level 1 (Yellow)   Elbow Extension Limitations VC and demo required   Wrist Flexion Strengthening;Left;10 reps  2 sets   Bar Weights/Barbell (Wrist Flexion) 2 lbs   Wrist Extension Strengthening;Left;10 reps  2 sets   Bar Weights/Barbell (Wrist Extension) 2 lbs   Other elbow exercises wrist flex/ext stretch x 30sec x 2 reps   Other elbow exercises elbow extension stretch 30 sec x 2; elbow extension bilat stretch with band to assist   Shoulder Exercises: Standing   Flexion Strengthening;Left;10 reps;Theraband  2 sets   Theraband Level (Shoulder Flexion) Level 1 (Yellow)   Wrist Exercises   Wrist Radial Deviation Left;10 reps   Bar Weights/Barbell (Radial Deviation) 2 lbs   Wrist Ulnar Deviation Left;10 reps   Bar Weights/Barbell (Ulnar Deviation) 2 lbs   Modalities   Modalities Ultrasound;Iontophoresis   Ultrasound   Ultrasound Location Lt lateral elbow.    Ultrasound Parameters 50%, 3.3 mHz, 1. 2 w/cm2 x 8 min   Ultrasound Goals Pain;Edema   Iontophoresis   Type of Iontophoresis Dexamethasone   Location Distal humerus/Lt lateral epicondyle  Dose 1.0   Time 6 hr patch   Manual Therapy   Manual Therapy Myofascial release;Joint mobilization   Myofascial Release Lt wrist flexors/ extensors, TPR to same area.    Other Manual Therapy Ktape applied to lateral Lt. epicondyle for edema                 PT Education - 05/30/14 0844    Education provided Yes   Education Details HEP    Person(s) Educated Patient   Methods Explanation;Handout   Comprehension Verbalized understanding             PT Long Term Goals - 05/24/14 0909    PT LONG TERM GOAL #1   Title I with HEP   Period Weeks   Status On-going   PT LONG TERM GOAL #2   Title increase grip Lt hand =/> 35#   Time 6   Period Weeks   Status On-going   PT LONG TERM GOAL #3   Title improve FOTO =/< 31% limited   Time 6   Period Weeks   Status On-going   PT LONG TERM  GOAL #4   Title decrease pain =/> 75% with daily activities   Time 6   Period Weeks   Status On-going   PT LONG TERM GOAL #5   Title type and lift at work without increased pain   Time 6   Period Weeks   Status On-going               Plan - 05/30/14 4481    Clinical Impression Statement Pt demo improved grip strength from last session. Pain now at distal lateral humerus and not directly on Lt lateral epicondyle.  Pt tolerated new exercises with minimal pain increase. Pt declined cryotherapy application; will apply on own.     Pt will benefit from skilled therapeutic intervention in order to improve on the following deficits Decreased strength;Pain;Impaired UE functional use   Rehab Potential Excellent   PT Frequency 2x / week   PT Duration 4 weeks   PT Treatment/Interventions Moist Heat;Therapeutic activities;Patient/family education;Passive range of motion;Therapeutic exercise;Ultrasound;Manual techniques;Cryotherapy;Electrical Stimulation   PT Next Visit Plan Assess response to new HEP.  continue per plan of care.    Consulted and Agree with Plan of Care Patient        Problem List Patient Active Problem List   Diagnosis Date Noted  . Skin lesion of left arm 09/11/2013  . Left lateral epicondylitis 04/05/2013  . Carpal tunnel syndrome, bilateral 04/05/2013  . Tennis elbow 03/24/2013  . LGSIL (low grade squamous intraepithelial dysplasia) 06/16/2012  . Plantar fasciosis 11/26/2011  . Diabetes mellitus without complication 85/63/1497  . PANIC ATTACK 09/07/2008  . RESTLESS LEG SYNDROME 04/02/2008  . POLYARTHRITIS 04/02/2008  . KNEE PAIN, LEFT 01/02/2008  . HOT FLASHES 08/04/2006  . HYPERCHOLESTEROLEMIA 11/03/2005  . HYPERTENSION, BENIGN SYSTEMIC 11/03/2005   Kerin Perna, PTA 05/30/2014 8:45 AM  [place], [place], [address] Phone: [phone]   Fax:   [phone]

## 2014-06-04 ENCOUNTER — Ambulatory Visit (INDEPENDENT_AMBULATORY_CARE_PROVIDER_SITE_OTHER): Payer: 59 | Admitting: Physical Therapy

## 2014-06-04 DIAGNOSIS — M25522 Pain in left elbow: Secondary | ICD-10-CM

## 2014-06-04 DIAGNOSIS — M6289 Other specified disorders of muscle: Secondary | ICD-10-CM

## 2014-06-04 DIAGNOSIS — R29898 Other symptoms and signs involving the musculoskeletal system: Secondary | ICD-10-CM

## 2014-06-04 NOTE — Therapy (Signed)
[place], [place], [address] Phone: [phone]   Fax:  [phone]  Physical Therapy Treatment  Patient Details  Name: Maria Nunez MRN: [id number] Date of Birth: 1960-05-05 Referring Provider:  Hali Marry, *  Encounter Date: 06/04/2014      PT End of Session - 06/04/14 0730    Visit Number 9   Number of Visits 14   Date for PT Re-Evaluation 06/21/14   PT Start Time 0730   PT Stop Time 0806   PT Time Calculation (min) 36 min   Activity Tolerance Patient tolerated treatment well      Past Medical History  Diagnosis Date  . Hypertension   . Allergy   . Hidradenitis   . Diabetes mellitus     borderline  . Fibroid   . Heart murmur     Past Surgical History  Procedure Laterality Date  . Cesarean section    . Inguinal hidradenitis excision    . Breast surgery      cysts removed right breast  . Abdominal hysterectomy      supracervical    There were no vitals filed for this visit.  Visit Diagnosis:  Weakness of left hand  Pain, elbow joint, left      Subjective Assessment - 06/04/14 0732    Subjective Pt reports that the bruising on Lt elbow has improved.  Lt elbow is still sore and painful with prolonged telephone use on Lt or holding grocery bags with LUE.    Patient Stated Goals patient wishes she can lift a glass, open bottles, types all day at work. Pain with picking up her phone.  Would like to lift weights with her arms again without pain   Currently in Pain? No/denies  Only with movement; 2/10.    Pain Location Elbow   Pain Orientation Left            [facility] PT Assessment - 06/04/14 0001    Assessment   Medical Diagnosis Lt tennis elbow, bilat carpal tunnel   Onset Date 04/09/13   Strength   Grip (lbs) 45#   Grip (lbs) 32#                     [facility] Adult PT Treatment/Exercise - 06/04/14 0001    Exercises   Exercises  Wrist;Elbow;Shoulder   Elbow Exercises   Elbow Flexion Strengthening;Both;10 reps  2 sets    Bar Weights/Barbell (Elbow Flexion) 4 lbs;5 lbs   Elbow Extension Strengthening;Left;10 reps   Theraband Level (Elbow Extension) Level 2 (Red)   Forearm Supination Left;10 reps  3#   Forearm Pronation Strengthening;Left;10 reps  3#   Wrist Flexion Strengthening;Left;10 reps   Bar Weights/Barbell (Wrist Flexion) 3 lbs   Wrist Extension Strengthening;Left;10 reps   Bar Weights/Barbell (Wrist Extension) 3 lbs   Other elbow exercises wrist flex/ext stretch x 30sec x 2 reps   Shoulder Exercises: ROM/Strengthening   UBE (Upper Arm Bike) Level 2: 2 min each direction    Shoulder Exercises: Stretch   Other Shoulder Stretches Lt tricep stretch, 30 sec x 2 reps; shoulder extension chest stretch -30 sec x 2 reps    Modalities   Modalities Ultrasound;Iontophoresis   Ultrasound   Ultrasound Location Lt lateral elbow (distal humerus)    Ultrasound Parameters 50%, 3.3 mHz, 1.1 w/cm2   Ultrasound Goals Pain   Iontophoresis   Type of Iontophoresis Dexamethasone   Location Distal humerus/Lt lateral epicondyle  Dose 1.0   Time 6 hr patch                PT Education - 06/04/14 0904    Education provided Yes   Education Details HEP - issued red band.  Pt to perform 3 sets of 10 of elbow flex and ext.     Person(s) Educated Patient   Methods Explanation;Demonstration   Comprehension Returned demonstration;Verbalized understanding             PT Long Term Goals - 06/04/14 0901    PT LONG TERM GOAL #1   Title I with HEP   Time 6   Period Weeks   Status On-going   PT LONG TERM GOAL #2   Title increase grip Lt hand =/> 35#   Time 6   Period Weeks   Status Achieved   PT LONG TERM GOAL #3   Title improve FOTO =/< 31% limited   Time 6   Period Weeks   Status On-going   PT LONG TERM GOAL #4   Title decrease pain =/> 75% with daily activities   Time 6   Period Weeks   Status  On-going   PT LONG TERM GOAL #5   Title type and lift at work without increased pain   Time 6   Period Weeks   Status On-going               Plan - 06/04/14 8676    Clinical Impression Statement Pt able to tolerate increased resistance with wrist and elbow exercises with minimal increase in pain. Pt noted wrist exercises feeling easier/less pain and overall pain level has decreased from 3/10 to 2/10 with functional activities. Progressing towards goals; has met LTG #2.    Pt will benefit from skilled therapeutic intervention in order to improve on the following deficits Decreased strength;Pain;Impaired UE functional use   Rehab Potential Excellent   PT Frequency 2x / week   PT Duration 4 weeks   PT Treatment/Interventions Moist Heat;Therapeutic activities;Patient/family education;Passive range of motion;Therapeutic exercise;Ultrasound;Manual techniques;Cryotherapy;Electrical Stimulation   PT Next Visit Plan Continue strengthening to elbow and wrist.    Consulted and Agree with Plan of Care Patient        Problem List Patient Active Problem List   Diagnosis Date Noted  . Skin lesion of left arm 09/11/2013  . Left lateral epicondylitis 04/05/2013  . Carpal tunnel syndrome, bilateral 04/05/2013  . Tennis elbow 03/24/2013  . LGSIL (low grade squamous intraepithelial dysplasia) 06/16/2012  . Plantar fasciosis 11/26/2011  . Diabetes mellitus without complication 72/09/4707  . PANIC ATTACK 09/07/2008  . RESTLESS LEG SYNDROME 04/02/2008  . POLYARTHRITIS 04/02/2008  . KNEE PAIN, LEFT 01/02/2008  . HOT FLASHES 08/04/2006  . HYPERCHOLESTEROLEMIA 11/03/2005  . HYPERTENSION, BENIGN SYSTEMIC 11/03/2005    Kerin Perna, PTA 06/04/2014 9:05 AM   [place], [place], [address] Phone: [phone]   Fax:  [phone]

## 2014-06-06 ENCOUNTER — Encounter: Payer: 59 | Admitting: Physical Therapy

## 2014-06-11 ENCOUNTER — Ambulatory Visit
Admission: RE | Admit: 2014-06-11 | Discharge: 2014-06-11 | Disposition: A | Discharge status: Home / Self Care | Payer: 59 | Source: Ambulatory Visit

## 2014-06-11 DIAGNOSIS — Z1231 Encounter for screening mammogram for malignant neoplasm of breast: Secondary | ICD-10-CM

## 2014-06-13 LAB — HM DIABETES EYE EXAM

## 2014-06-14 ENCOUNTER — Encounter: Payer: 59 | Admitting: Physical Therapy

## 2014-06-22 ENCOUNTER — Encounter: Payer: Self-pay | Admitting: Pharmacist

## 2014-06-22 ENCOUNTER — Ambulatory Visit (INDEPENDENT_AMBULATORY_CARE_PROVIDER_SITE_OTHER): Payer: 59 | Admitting: Physical Therapy

## 2014-06-22 DIAGNOSIS — M6289 Other specified disorders of muscle: Secondary | ICD-10-CM | POA: Diagnosis not present

## 2014-06-22 DIAGNOSIS — M25522 Pain in left elbow: Secondary | ICD-10-CM

## 2014-06-22 DIAGNOSIS — R29898 Other symptoms and signs involving the musculoskeletal system: Secondary | ICD-10-CM

## 2014-06-22 NOTE — Therapy (Addendum)
[place], [place], [address] Phone: [phone]   Fax:  [phone]  Physical Therapy Treatment  Patient Details  Name: Maria Nunez MRN: [id number] Date of Birth: [birth year] Referring Provider:  Hali Marry, *  Encounter Date: 06/22/2014      PT End of Session - 06/22/14 0803    Visit Number 10   Number of Visits 14   Date for PT Re-Evaluation 06/21/14   PT Start Time 0800   PT Stop Time 0838   PT Time Calculation (min) 38 min   Activity Tolerance Patient tolerated treatment well      Past Medical History  Diagnosis Date  . Hypertension   . Allergy   . Hidradenitis   . Diabetes mellitus     borderline  . Fibroid   . Heart murmur     Past Surgical History  Procedure Laterality Date  . Cesarean section    . Inguinal hidradenitis excision    . Breast surgery      cysts removed right breast  . Abdominal hysterectomy      supracervical    There were no vitals filed for this visit.  Visit Diagnosis:  Weakness of left hand  Pain, elbow joint, left      Subjective Assessment - 06/22/14 0806    Subjective Pt reports she was out shopping and was able to lift shopping bag without pain for the first time. Pt reports she has been compliant with HEP (every other day) and has noticed marked improvement.    Currently in Pain? No/denies  "just sore to the touch"             [facility] PT Assessment - 06/22/14 0001    Assessment   Medical Diagnosis Lt tennis elbow, bilat carpal tunnel   Onset Date/Surgical Date 04/09/13   Next MD Visit 07/04/14   Observation/Other Assessments   Focus on Therapeutic Outcomes (FOTO)  24% limitation    Strength   Right/Left Elbow Left   Left Elbow Flexion 5/5   Left Elbow Extension 5/5   Right/Left Wrist Left   Left Wrist Flexion 5/5   Left Wrist Extension 4+/5   Left Wrist Radial Deviation 5/5   Left Wrist Ulnar Deviation 5/5    Right Hand Grip (lbs) 40#   Left Hand Grip (lbs) 35#                     [facility] Adult PT Treatment/Exercise - 06/22/14 0001    Elbow Exercises   Elbow Flexion Strengthening;Left;10 reps;Theraband;Standing  2 sets   Theraband Level (Elbow Flexion) Level 3 (Green)  2 sets   Elbow Extension Strengthening;Left;10 reps;Standing;Theraband  2 sets   Theraband Level (Elbow Extension) Level 3 (Green)   Forearm Supination Strengthening;Left;10 reps;Seated;Bar weights/barbell  3#   Forearm Pronation Strengthening;Left;5 reps;Seated;Bar weights/barbell  3#   Wrist Flexion Strengthening;Left;10 reps;Seated;Bar weights/barbell  sets   Bar Weights/Barbell (Wrist Flexion) 3 lbs   Wrist Extension Strengthening;Left;10 reps;Seated  2 sets   Bar Weights/Barbell (Wrist Extension) 3 lbs   Other elbow exercises wrist flex/ext stretch x 30sec x 2 reps   Other elbow exercises pronation with ext stretch for Lt x 30 sec    Shoulder Exercises: Standing   Extension 10 reps;Left;Strengthening;Theraband   Theraband Level (Shoulder Extension) Level 3 (Green)   Modalities   Modalities Cryotherapy   Cryotherapy   Number Minutes Cryotherapy 5 Minutes   Cryotherapy Location --  elbow   Type of Cryotherapy Ice massage                PT Education - 06/22/14 0906    Education provided Yes   Education Details Pt issued green band to advance currrent HEP    Person(s) Educated Patient   Methods Explanation   Comprehension Verbalized understanding;Returned demonstration             PT Long Term Goals - 06/22/14 4315    PT LONG TERM GOAL #1   Title I with HEP   Time 6   Period Weeks   Status Achieved   PT LONG TERM GOAL #2   Title increase grip Lt hand =/> 35#   Time 6   Period Weeks   Status Achieved   PT LONG TERM GOAL #3   Title improve FOTO =/< 31% limited   Time 6   Period Weeks   Status Achieved   PT LONG TERM GOAL #4   Title decrease pain =/> 75% with daily  activities   Time 6   Period Weeks   Status Achieved   PT LONG TERM GOAL #5   Title type and lift at work without increased pain   Time 6   Period Weeks   Status Achieved               Plan - 06/22/14 0840    Clinical Impression Statement Pt reporting decreased/resolved Lt elbow pain. Pt has met her goals and is satisfied with her current level of function.    Pt will benefit from skilled therapeutic intervention in order to improve on the following deficits Decreased strength;Pain;Impaired UE functional use   Rehab Potential Excellent   PT Treatment/Interventions Moist Heat;Therapeutic activities;Patient/family education;Passive range of motion;Therapeutic exercise;Ultrasound;Manual techniques;Cryotherapy;Electrical Stimulation   PT Next Visit Plan Spoke to supervising PT regarding pt's progress.  D/C to HEP.    Consulted and Agree with Plan of Care Patient        Problem List Patient Active Problem List   Diagnosis Date Noted  . Skin lesion of left arm 09/11/2013  . Left lateral epicondylitis 04/05/2013  . Carpal tunnel syndrome, bilateral 04/05/2013  . Tennis elbow 03/24/2013  . LGSIL (low grade squamous intraepithelial dysplasia) 06/16/2012  . Plantar fasciosis 11/26/2011  . Diabetes mellitus without complication 40/08/6759  . PANIC ATTACK 09/07/2008  . RESTLESS LEG SYNDROME 04/02/2008  . POLYARTHRITIS 04/02/2008  . KNEE PAIN, LEFT 01/02/2008  . HOT FLASHES 08/04/2006  . HYPERCHOLESTEROLEMIA 11/03/2005  . HYPERTENSION, BENIGN SYSTEMIC 11/03/2005   Kerin Perna, PTA 06/22/2014 9:07 AM  [place], [place], [address] Phone: [phone]   Fax:  [phone]     PHYSICAL THERAPY DISCHARGE SUMMARY  Visits from Start of Care: 10  Current functional level related to goals / functional outcomes: See above   Remaining deficits: See above.   Education /  Equipment: HEP  Plan: Patient agrees to discharge.  Patient goals were met. Patient is being discharged due to meeting the stated rehab goals.  ?????       Madelyn Flavors, PT 06/22/2014 10:10 AM  [facility] at [place], [place] [address]  [phone] (office) [phone] (fax)

## 2014-07-04 ENCOUNTER — Ambulatory Visit (INDEPENDENT_AMBULATORY_CARE_PROVIDER_SITE_OTHER): Payer: 59 | Admitting: Family Medicine

## 2014-07-04 ENCOUNTER — Encounter: Payer: Self-pay | Admitting: Family Medicine

## 2014-07-04 VITALS — BP 130/76 | HR 91

## 2014-07-04 DIAGNOSIS — I10 Essential (primary) hypertension: Secondary | ICD-10-CM | POA: Diagnosis not present

## 2014-07-04 DIAGNOSIS — E119 Type 2 diabetes mellitus without complications: Secondary | ICD-10-CM | POA: Diagnosis not present

## 2014-07-04 LAB — POCT GLYCOSYLATED HEMOGLOBIN (HGB A1C): Hemoglobin A1C: 7.2

## 2014-07-04 MED ORDER — SITAGLIPTIN PHOSPHATE 100 MG PO TABS: 100.0000 mg | ORAL_TABLET | Freq: Every day | ORAL | Status: DC

## 2014-07-04 NOTE — Patient Instructions (Signed)
Make sure to activate and use the coupon card for the [place]. Call us for the combo with metformin when you get low.

## 2014-07-04 NOTE — Progress Notes (Signed)
   Subjective:    Patient ID: Maria Nunez, female    DOB: 1960-11-21, 54 y.o.   MRN: [id number]  HPI Diabetes - no hypoglycemic events. No wounds or sores that are not healing well. No increased thirst or urination. Checking glucose at home. Taking medications as prescribed without any side effects.  Has been going ot nutrition counseling through work.  7 days average is 116, 14 day ave is 121, 30 ay ave is 124.   Hypertension- Pt denies chest pain, SOB, dizziness, or heart palpitations.  Taking meds as directed w/o problems.  Denies medication side effects.      Chemistry      Component Value Date/Time   NA 139 05/24/2014 0903   K 3.9 05/24/2014 0903   CL 100 05/24/2014 0903   CO2 29 05/24/2014 0903   BUN 15 05/24/2014 0903   CREATININE 0.58 05/24/2014 0903   CREATININE 0.73 03/25/2010 1600      Component Value Date/Time   CALCIUM 9.8 05/24/2014 0903   ALKPHOS 79 06/29/2013 0917   AST 17 06/29/2013 0917   ALT 20 06/29/2013 0917   BILITOT 0.4 06/29/2013 0917       Review of Systems     Objective:   Physical Exam  Constitutional: She is oriented to person, place, and time. She appears well-developed and well-nourished.  HENT:  Head: Normocephalic and atraumatic.  Cardiovascular: Normal rate, regular rhythm and normal heart sounds.   Pulmonary/Chest: Effort normal and breath sounds normal.  Neurological: She is alert and oriented to person, place, and time.  Skin: Skin is warm and dry.  Psychiatric: She has a normal mood and affect. Her behavior is normal.          Assessment & Plan:  DM- Uncontrolled. Will add [place]. If tolerates well can change to Janumet combo pill. F/U in 3 months.  Foot exam performed today.  Due for eye exam.    HTN - well controlled. Continue current regime. Labs are UTD.

## 2014-07-05 ENCOUNTER — Encounter: Payer: Self-pay | Admitting: Family Medicine

## 2014-07-13 ENCOUNTER — Ambulatory Visit: Payer: 59 | Admitting: Obstetrics and Gynecology

## 2014-07-20 ENCOUNTER — Ambulatory Visit: Payer: 59 | Admitting: Obstetrics and Gynecology

## 2014-08-20 ENCOUNTER — Ambulatory Visit: Payer: 59 | Admitting: Pharmacist

## 2014-08-20 ENCOUNTER — Ambulatory Visit (INDEPENDENT_AMBULATORY_CARE_PROVIDER_SITE_OTHER): Payer: Self-pay | Admitting: Family Medicine

## 2014-08-20 VITALS — BP 128/86 | Ht 59.0 in | Wt 170.0 lb

## 2014-08-20 DIAGNOSIS — E119 Type 2 diabetes mellitus without complications: Secondary | ICD-10-CM

## 2014-08-20 NOTE — Progress Notes (Signed)
Subjective:  Patient presents today for 3 month diabetes follow-up as part of the employer-sponsored [employer] to Wellness program.  Current diabetes regimen includes metformin 1000 mg BID.  Patient also continues on daily ASA, ARB and statin.  Most  No major health changes at this time.   She last had an appointment with Dr. Madilyn Fireman in June. A1C had increased to 7.2%. Januvia was added at that time. She tried it for 2 weeks and she stopped the medication because she was having back pain and nausea. She is still taking metformin.   Patient states she has lost 4 more pounds!!  Assessment/Plan:  Patient is a 54 y.o. female with DM 2. Most recent A1C was 7.2  % which is exceeding goal of less than 7%. Weight is decreased 2 pounds from last visit with me.   CBG Review: Testing BID. Ocassional hypoglycemia- maybe once a month when she doesn't eat.   High/Low- 177/86  Lifestyle improvements:  Physical Activity-  She is either going to the gym or walking on the treadmill at home. She is exercising 3-4 days a week for 30-45 minutes at home and 60 minutes at the gym (doing a combination of treadmill and weight lifting machines).   This is an improvement from last visit- previously she was only doing 60 minutes of physical activity a day.   Nutrition-  Patient has cut out eating ice cream daily. She feels like has made a difference in her blood sugar and in her weight. She has also cut out cookies and most concentrated sweets.   She is now eating fruit. She doesn't eat much vegetables.   No other changes with her diet.    Congratulated patient on her success so far and encouraged her to continues.   Follow up with me in 3 months.    Goals for Next Visit:  1. Continue physical activity- aim for 4 days a week for at least 30 minutes.  2. Continue to avoid ice cream and concentrated sweets.  3. Continue to log in your workouts on [employer] so you can claim your wellness rewards.      Next appointment to see me is: Monday November 19, 2014 at 8 AM.    Truett Mainland. Donneta Romberg, PharmD, BCPS, CDE [facility] [place] Coordinator [phone]

## 2014-08-27 NOTE — Progress Notes (Signed)
Patient ID: Maria Nunez, female   DOB: 05/01/1960, 54 y.o.   MRN: [id number] ATTENDING PHYSICIAN NOTE: I have reviewed the chart and agree with the plan as detailed above. Dorcas Mcmurray MD Pager [phone]

## 2014-09-14 ENCOUNTER — Ambulatory Visit: Payer: 59 | Admitting: Obstetrics and Gynecology

## 2014-10-03 ENCOUNTER — Ambulatory Visit (INDEPENDENT_AMBULATORY_CARE_PROVIDER_SITE_OTHER): Payer: 59 | Admitting: Family Medicine

## 2014-10-03 ENCOUNTER — Encounter: Payer: Self-pay | Admitting: Family Medicine

## 2014-10-03 VITALS — BP 151/73 | HR 89 | Ht 59.0 in | Wt 171.0 lb

## 2014-10-03 DIAGNOSIS — E7800 Pure hypercholesterolemia, unspecified: Secondary | ICD-10-CM

## 2014-10-03 DIAGNOSIS — E119 Type 2 diabetes mellitus without complications: Secondary | ICD-10-CM

## 2014-10-03 DIAGNOSIS — I10 Essential (primary) hypertension: Secondary | ICD-10-CM | POA: Diagnosis not present

## 2014-10-03 LAB — POCT UA - MICROALBUMIN
Creatinine, POC: 300 mg/dL
Microalbumin Ur, POC: 80 mg/L

## 2014-10-03 LAB — POCT GLYCOSYLATED HEMOGLOBIN (HGB A1C): Hemoglobin A1C: 6.1

## 2014-10-03 NOTE — Progress Notes (Addendum)
   Subjective:    Patient ID: Maria Nunez, female    DOB: 27-Oct-1960, 54 y.o.   MRN: [id number]  HPI Diabetes - no hypoglycemic events. No wounds or sores that are not healing well. No increased thirst or urination. Checking glucose at home. Taking medications as prescribed without any side effects. Says took the [place] for 2 weeks and says it really upset her stomach so finally stopped it.  Still on metformin. Has had a couple of episdoes of nausea.   Hypertension- Pt denies chest pain, SOB, dizziness, or heart palpitations.  Taking meds as directed w/o problems.  Denies medication side effects.    Hyperlipidemia-tolerating pravastatin well without any side effects or problems.  Review of Systems     Objective:   Physical Exam  Constitutional: She is oriented to person, place, and time. She appears well-developed and well-nourished.  HENT:  Head: Normocephalic and atraumatic.  Cardiovascular: Normal rate, regular rhythm and normal heart sounds.   Pulmonary/Chest: Effort normal and breath sounds normal.  Neurological: She is alert and oriented to person, place, and time.  Skin: Skin is warm and dry.  Psychiatric: She has a normal mood and affect. Her behavior is normal.          Assessment & Plan:  DM- A1c looks great today at 6.1. She said looking back she had been eating a lot of ice cream when she came in the last time and her A1c was 7.2. She has backed off on that and has been hitting the gym more which is fantastic. I think at this point we can stick with the metformin and just follow back up in 3 months. We discussed that if her A1c starts to creep up again then we will need to consider additional medication again.  HTN - BP not well controlled today. Make sure taking medication regularly. She is going to have a nurse at work check her blood pressure couple times over the next couple weeks. If blood pressures are still over 140 she will let me know and we can adjust  her medication regimen.  Lipid hyperlipidemia-due to repeat lipid levels. Continue with statin.  Lab Results  Component Value Date   HGBA1C 6.1 10/03/2014

## 2014-10-05 ENCOUNTER — Ambulatory Visit: Payer: 59 | Admitting: Obstetrics and Gynecology

## 2014-10-18 ENCOUNTER — Other Ambulatory Visit: Payer: Self-pay | Admitting: Family Medicine

## 2014-10-24 ENCOUNTER — Encounter: Payer: Self-pay | Admitting: Obstetrics and Gynecology

## 2014-10-24 ENCOUNTER — Ambulatory Visit (INDEPENDENT_AMBULATORY_CARE_PROVIDER_SITE_OTHER): Payer: 59 | Admitting: Obstetrics and Gynecology

## 2014-10-24 VITALS — BP 138/80 | HR 88 | Resp 18 | Ht 59.75 in | Wt 170.0 lb

## 2014-10-24 DIAGNOSIS — Z01419 Encounter for gynecological examination (general) (routine) without abnormal findings: Secondary | ICD-10-CM

## 2014-10-24 DIAGNOSIS — N649 Disorder of breast, unspecified: Secondary | ICD-10-CM

## 2014-10-24 DIAGNOSIS — L988 Other specified disorders of the skin and subcutaneous tissue: Secondary | ICD-10-CM

## 2014-10-24 MED ORDER — MUPIROCIN CALCIUM 2 % EX CREA: 1.0000 "application " | TOPICAL_CREAM | Freq: Two times a day (BID) | CUTANEOUS | Status: DC

## 2014-10-24 NOTE — Patient Instructions (Signed)

## 2014-10-24 NOTE — Progress Notes (Signed)
54 y.o. G87P2012 Divorced [name] here for annual exam.    Has a right breast cyst for the last month.  This is a superficial area of the skin but it does not seem to be healing.  This occurs in the same spot when it occurs.  Has had cysts removed surgically in the past as well.   Exercising three times a week.  Weights and cardio.  Good stress reliever.  No vaginal bleeding or spotting.   Had ultrasound in March this year due to pelvic cramping at that time and left ovary is pulled toward the right.   PCP:  Metheney,Catherine   No LMP recorded. Patient has had a hysterectomy.          Sexually active: No.  The current method of family planning is status post hysterectomy.    Exercising: Yes.    Gym Smoker:  no  Health Maintenance: Pap: 03/24/13 LSIL CIN I. HR HPV:+Detected. Leep 05/04/13.  History of abnormal Pap:  yes MMG:  06/12/14 BIRADS1:neg Colonoscopy:  07/24/13 Normal - repeat 10 years  BMD:   Never  TDaP:  2016  Screening Labs:  Hb today: PCP, Urine today: PCP   reports that she has never smoked. She has never used smokeless tobacco. She reports that she does not drink alcohol or use illicit drugs.  Past Medical History  Diagnosis Date  . Hypertension   . Allergy   . Hidradenitis   . Diabetes mellitus     borderline  . Fibroid   . Heart murmur     Past Surgical History  Procedure Laterality Date  . Cesarean section    . Inguinal hidradenitis excision    . Breast surgery      cysts removed right breast  . Abdominal hysterectomy      supracervical    Current Outpatient Prescriptions  Medication Sig Dispense Refill  . aspirin 81 MG tablet Take 81 mg by mouth daily.      [name] FOLIC ACID PO Take 443 mcg by mouth daily.     Elmore Guise Device MISC      . losartan-hydrochlorothiazide (HYZAAR) 100-25 MG per tablet TAKE 1 TABLET BY MOUTH ONCE DAILY 90 tablet PRN  . metFORMIN (GLUCOPHAGE) 1000 MG tablet Take 1 tablet (1,000 mg total) by mouth 2 (two) times  daily with a meal. 180 tablet 3  . Multiple Vitamin (MULTIVITAMIN) tablet Take 1 tablet by mouth daily.      . Omega-3 Fatty Acids (FISH OIL) 1000 MG CAPS Take 1,000 mg by mouth 2 (two) times daily.     . pravastatin (PRAVACHOL) 40 MG tablet Take 1 tablet (40 mg total) by mouth daily. Patient needs to schedule a follow up lab work before more refills. 30 tablet 0  . TRUETEST TEST test strip TEST BLOOD SUGAR TWICE DAILY 200 each 2  . Zinc Sulfate (ZINC 15 PO) Take by mouth.       No current facility-administered medications for this visit.    Family History  Problem Relation Age of Onset  . Asthma Mother   . COPD Mother   . Diabetes Mother   . Hypertension Mother   . Diabetes Father   . Hypertension Father   . Breast cancer Maternal Aunt   . Breast cancer Paternal Aunt     ROS:  Pertinent items are noted in HPI.  Otherwise, a comprehensive ROS was negative.  Exam:   BP 138/80 mmHg  Pulse 88  Resp 18  Ht 4' 11.75" (1.518 m)  Wt 170 lb (77.111 kg)  BMI 33.46 kg/m2    General appearance: alert, cooperative and appears stated age Head: Normocephalic, without obvious abnormality, atraumatic Neck: no adenopathy, supple, symmetrical, trachea midline and thyroid normal to inspection and palpation Lungs: clear to auscultation bilaterally Breasts: normal appearance, no masses or tenderness, No nipple retraction or dimpling, No nipple discharge or bleeding, No axillary or supraclavicular adenopathy, small eschar of right breast measuring 4 mm at the 4:00 position. Heart: regular rate and rhythm Abdomen: soft, non-tender; bowel sounds normal; no masses,  no organomegaly Extremities: extremities normal, atraumatic, no cyanosis or edema Skin: Skin color, texture, turgor normal. No rashes or lesions Lymph nodes: Cervical, supraclavicular, and axillary nodes normal. No abnormal inguinal nodes palpated Neurologic: Grossly normal  Pelvic: External genitalia:  no lesions               Urethra:  normal appearing urethra with no masses, tenderness or lesions              Bartholins and Skenes: normal                 Vagina: normal appearing vagina with normal color and discharge, no lesions              Cervix: cervix present.               Pap taken: Yes.   Bimanual Exam:  Uterus:  uterus absent              Adnexa: normal adnexa and no mass, fullness, tenderness              Rectovaginal: Yes.  .  Confirms.              Anus:  normal sphincter tone,  Hemorrhoids.  Chaperone was present for exam.  Assessment:   Well woman visit with normal exam. Status post supracervical hysterectomy.  Right breast skin with nonhealing area. Status post LEEP.  LGSIL.   Plan: Yearly mammogram recommended after age 61.  Discussed 3D. Recommended self breast exam.  Pap and HR HPV as above. Discussed Calcium, Vitamin D, regular exercise program including cardiovascular and weight bearing exercise. Labs performed.  No..   See orders. Refills given on medications.  Yes.  .  See orders.  Rx for Bactroban for breast skin twice a day.  Call if breast lesion does not resolve. May then need to see dermatology. Follow up annually and prn.      After visit summary provided.

## 2014-10-26 LAB — IPS PAP TEST WITH HPV

## 2014-10-31 ENCOUNTER — Encounter: Payer: Self-pay | Admitting: Obstetrics and Gynecology

## 2014-10-31 ENCOUNTER — Telehealth: Payer: Self-pay

## 2014-10-31 NOTE — Telephone Encounter (Signed)
Telephone encounter created for review of mychart message with Dr.Silva. 

## 2014-10-31 NOTE — Telephone Encounter (Signed)
Response given to patient through My Chart.  Encounter closed.

## 2014-10-31 NOTE — Telephone Encounter (Signed)
Patient is requesting clarification of results as seen below on pap smear. Routing to Dr.Silva for review.

## 2014-10-31 NOTE — Telephone Encounter (Signed)
Non-Urgent Medical Question  Message [id number]   From  Patterson, MD   Sent  10/31/2014 8:52 AM     Good morning Dr. Quincy Simmonds. I was reading the results of my test. Could you tell me what the below means?   ADEQUACY OF SPECIMEN:  Satisfactory for evaluation. The presence or absence of endocervical cells/transformation zone component cannot be determined due to atrophy.   OTHER CYTOLOGIC FINDINGS:  Marked/Severe Inflammation.      Responsible Party    Pool - [facility] Clinical Pool No one has taken responsibility for this message.     No actions have been taken on this message.

## 2014-11-02 ENCOUNTER — Ambulatory Visit (INDEPENDENT_AMBULATORY_CARE_PROVIDER_SITE_OTHER): Payer: 59 | Admitting: Osteopathic Medicine

## 2014-11-02 ENCOUNTER — Encounter: Payer: Self-pay | Admitting: Osteopathic Medicine

## 2014-11-02 VITALS — BP 141/75 | HR 87 | Wt 171.0 lb

## 2014-11-02 DIAGNOSIS — M9903 Segmental and somatic dysfunction of lumbar region: Secondary | ICD-10-CM

## 2014-11-02 DIAGNOSIS — M545 Low back pain, unspecified: Secondary | ICD-10-CM

## 2014-11-02 DIAGNOSIS — S39012A Strain of muscle, fascia and tendon of lower back, initial encounter: Secondary | ICD-10-CM

## 2014-11-02 MED ORDER — CYCLOBENZAPRINE HCL 5 MG PO TABS: 5.0000 mg | ORAL_TABLET | Freq: Three times a day (TID) | ORAL | Status: DC | PRN

## 2014-11-02 MED ORDER — NAPROXEN 375 MG PO TABS
375.0000 mg | ORAL_TABLET | Freq: Two times a day (BID) | ORAL | Status: DC
Start: 2014-11-02 — End: 2015-04-02

## 2014-11-02 NOTE — Progress Notes (Signed)
HPI: Maria Nunez is a 54 y.o. female who presents to [place]  today for chief complaint of:  Chief Complaint  Patient presents with  . back pain x 1 week    . Location: right lower back  . Quality: pain, radiating across lower back . Severity: mild to moderate . Duration: 1 week  . Timing: constant . Context: no injury at onset  . Modifying factors: tried tylenol/ibuprofen . Assoc signs/symptoms: walking ok, no sciatica, no saddle anesthesia/incontinence   Past medical, social and family history reviewed: Past Medical History  Diagnosis Date  . Hypertension   . Allergy   . Hidradenitis   . Diabetes mellitus     borderline  . Fibroid   . Heart murmur    Past Surgical History  Procedure Laterality Date  . Cesarean section    . Inguinal hidradenitis excision    . Breast surgery      cysts removed right breast  . Abdominal hysterectomy      supracervical  . Leep  05/04/13    HPV change   Social History  Substance Use Topics  . Smoking status: Never Smoker   . Smokeless tobacco: Never Used  . Alcohol Use: No   Family History  Problem Relation Age of Onset  . Asthma Mother   . COPD Mother   . Diabetes Mother   . Hypertension Mother   . Diabetes Father   . Hypertension Father   . Breast cancer Maternal Aunt   . Breast cancer Paternal Aunt     Current Outpatient Prescriptions  Medication Sig Dispense Refill  . aspirin 81 MG tablet Take 81 mg by mouth daily.      [name] FOLIC ACID PO Take 277 mcg by mouth daily.     Elmore Guise Device MISC      . losartan-hydrochlorothiazide (HYZAAR) 100-25 MG per tablet TAKE 1 TABLET BY MOUTH ONCE DAILY 90 tablet PRN  . metFORMIN (GLUCOPHAGE) 1000 MG tablet Take 1 tablet (1,000 mg total) by mouth 2 (two) times daily with a meal. 180 tablet 3  . Multiple Vitamin (MULTIVITAMIN) tablet Take 1 tablet by mouth daily.      . mupirocin cream (BACTROBAN) 2 % Apply 1 application topically 2 (two)  times daily. 15 g 0  . Omega-3 Fatty Acids (FISH OIL) 1000 MG CAPS Take 1,000 mg by mouth 2 (two) times daily.     . pravastatin (PRAVACHOL) 40 MG tablet Take 1 tablet (40 mg total) by mouth daily. Patient needs to schedule a follow up lab work before more refills. 30 tablet 0  . TRUETEST TEST test strip TEST BLOOD SUGAR TWICE DAILY 200 each 2  . Zinc Sulfate (ZINC 15 PO) Take by mouth.       No current facility-administered medications for this visit.   Allergies  Allergen Reactions  . Cefaclor   . Cephalexin   . Hycodan [Hydrocodone-Homatropine] Itching  . Januvia [Sitagliptin] Nausea Only    Back pain  . Penicillins   . Sulfonamide Derivatives     REACTION: hives  . Tetracycline       Review of Systems: CONSTITUTIONAL: Neg fever/chills, no unintentional weight changes CARDIAC: No chest pain/pressure/palpitations, no orthopnea RESPIRATORY: No cough/shortness of breath/wheeze GASTROINTESTINAL: No nausea/vomiting/abdominal pain/blood in stool/diarrhea/constipation MUSCULOSKELETAL: (+) myalgia/arthralgia - back pain as per HPI GENITOURINARY: No incontinence, No abnormal genital bleeding/discharge NEUROLOGIC: No weakness/dizzines/slurred speech    Exam:  BP 141/75 mmHg  Pulse 87  Wt  171 lb (77.565 kg) Constitutional: VSS, see above. General Appearance: alert, well-developed, well-nourished, NAD Respiratory: Normal respiratory effort. no wheeze/rhonchi/rales Cardiovascular: S1/S2 normal, no murmur/rub/gallop auscultated. RRR. No lower extremity edema. Musculoskeletal: Gait normal. No clubbing/cyanosis of digits. (+) perilumbar tenderness on R, (-) straight leg raise  Neurological: No cranial nerve deficit on limited exam. Motor and sensation intact and symmetric Psychiatric: Normal judgment/insight. Normal mood and affect. Oriented x3.    No results found for this or any previous visit (from the past 72 hour(s)).    ASSESSMENT/PLAN:  Right-sided low back pain without  sciatica - Plan: naproxen (NAPROSYN) 375 MG tablet, cyclobenzaprine (FLEXERIL) 5 MG tablet  Lumbar region somatic dysfunction OMT myofascial release attempted ot lumbar area, pt reports minimial or no relief of pain.   Lumbar strain, initial encounter   Patient has been educated on significant possible side effects of medication and is instructed to contact me or other medical professional with any concerns about side effects.   Avoid chronic use Naproxen with ASA, lower dose given for Naproxen.   Consider imaging/PT if no improvement.

## 2014-11-02 NOTE — Patient Instructions (Signed)
IF YOUR BACK PAIN IS NO BETTER IN 1 - 2 WEEKS, CALL OFFICE FOR REEVALUATION.  IF BACK PAIN PERSISTS WE CAN REFER FOR PHYSICAL THERAPY OR CONSIDER FURTHER WORKUP AS NEEDED.  HEATING PAD USE AND STRETCHING ENCOURAGED. WALKING AS TOLERATED IS ENCOURAGED AS WELL.

## 2014-11-15 ENCOUNTER — Ambulatory Visit: Payer: 59 | Admitting: Obstetrics and Gynecology

## 2014-11-17 LAB — COMPLETE METABOLIC PANEL WITH GFR
ALT: 41 U/L — ABNORMAL HIGH (ref 6–29)
AST: 26 U/L (ref 10–35)
Albumin: 4.1 g/dL (ref 3.6–5.1)
Alkaline Phosphatase: 79 U/L (ref 33–130)
BUN: 14 mg/dL (ref 7–25)
CO2: 27 mmol/L (ref 20–31)
Calcium: 9.7 mg/dL (ref 8.6–10.4)
Chloride: 99 mmol/L (ref 98–110)
Creat: 0.57 mg/dL (ref 0.50–1.05)
GFR, Est African American: >89 mL/min (ref >=60)
GFR, Est Non African American: >89 mL/min (ref >=60)
Glucose, Bld: 117 mg/dL — ABNORMAL HIGH (ref 65–99)
Potassium: 3.7 mmol/L (ref 3.5–5.3)
Sodium: 139 mmol/L (ref 135–146)
Total Bilirubin: 0.3 mg/dL (ref 0.2–1.2)
Total Protein: 6.6 g/dL (ref 6.1–8.1)

## 2014-11-17 LAB — LIPID PANEL
Cholesterol: 155 mg/dL (ref 125–200)
HDL: 45 mg/dL — ABNORMAL LOW (ref >=46)
LDL Cholesterol: 88 mg/dL (ref <130)
Total CHOL/HDL Ratio: 3.4 Ratio (ref <=5.0)
Triglycerides: 109 mg/dL (ref <150)
VLDL: 22 mg/dL (ref <30)

## 2014-11-19 ENCOUNTER — Encounter: Payer: Self-pay | Admitting: Pharmacist

## 2014-11-19 ENCOUNTER — Other Ambulatory Visit: Payer: Self-pay | Admitting: Family Medicine

## 2014-11-19 ENCOUNTER — Other Ambulatory Visit: Payer: Self-pay | Admitting: *Deleted

## 2014-11-19 ENCOUNTER — Ambulatory Visit (INDEPENDENT_AMBULATORY_CARE_PROVIDER_SITE_OTHER): Payer: Self-pay | Admitting: Family Medicine

## 2014-11-19 VITALS — BP 122/76 | Ht 59.0 in | Wt 170.4 lb

## 2014-11-19 DIAGNOSIS — R748 Abnormal levels of other serum enzymes: Secondary | ICD-10-CM

## 2014-11-19 DIAGNOSIS — E119 Type 2 diabetes mellitus without complications: Secondary | ICD-10-CM

## 2014-11-19 NOTE — Progress Notes (Signed)
Subjective:  Patient presents today for 3 month diabetes follow-up as part of the employer-sponsored Link to Wellness program.  Current diabetes regimen includes metformin 1000 mg BID.  Patient also continues on daily ASA, ARB and statin.   No med changes or major health changes at this time.   Most recent visit with Dr. Madilyn Fireman was in September. A1c was 6.1% at that visit.   She was prescribed mupirocin cream for a cyst on her chest. She did not pick it up and has been using triple antibiotic cream to treat it. It has not cleared up using that and I filled the mupirocin for her today.   Assessment:  Diabetes: Most recent A1C was 6.1 % which is at goal of less than 7%. Weight is stable from last visit with me.    CBG Review: Patient did not bring meter with her to appointment. Checking BID. Per patient report fasting runs 115- 125. Bedtime readings are 150-160. 1 episode of hypoglycemia following gardening. Nothing on a regular basis.   Gave patient a new True Metrix meter today and faxed for a refill on the true metrix test strips.    Lifestyle improvements:  Physical Activity-  She hasn't been to the gym in about a month. She hurt her back a few weeks ago and hasn't been exercising. Previously she was going a few times a week for 60 minutes.   She has been walking the dog 2-3 times a week for 20 minutes.   She is interested in losing more weight (has lost 12 pounds since this time last year).    Nutrition-  Patient states that she is eating three meals daily and has cut back on snacking. When she is snacking she is eating yogurt or cottage cheese.     Follow up with me in 3 months.    Plan/Goals for Next Visit:  1. Get back to going to the gym to exercise at least twice a week for 45 minutes. While your back is healing make sure that you are still walking for physical activity.  2. Continue making healthy eating choices and avoid sweets.     Next appointment to see me  is: Monday January 23rd at 8 AM.    Jinny Blossom D. Donneta Romberg, PharmD, BCPS, CDE [facility] Coordinator [phone]

## 2014-11-26 NOTE — Progress Notes (Signed)
I have reviewed this pharmacist's note and agree  

## 2014-12-11 ENCOUNTER — Other Ambulatory Visit: Payer: Self-pay | Admitting: Family Medicine

## 2014-12-11 NOTE — Addendum Note (Signed)
Addended by: Teddy Spike on: 12/11/2014 05:34 PM   Modules accepted: Orders

## 2015-01-02 ENCOUNTER — Encounter: Payer: Self-pay | Admitting: Family Medicine

## 2015-01-02 ENCOUNTER — Ambulatory Visit (INDEPENDENT_AMBULATORY_CARE_PROVIDER_SITE_OTHER): Payer: 59 | Admitting: Family Medicine

## 2015-01-02 VITALS — BP 142/78 | HR 87 | Temp 98.5°F | Resp 18 | Wt 169.7 lb

## 2015-01-02 DIAGNOSIS — E119 Type 2 diabetes mellitus without complications: Secondary | ICD-10-CM | POA: Diagnosis not present

## 2015-01-02 DIAGNOSIS — I10 Essential (primary) hypertension: Secondary | ICD-10-CM | POA: Diagnosis not present

## 2015-01-02 DIAGNOSIS — R748 Abnormal levels of other serum enzymes: Secondary | ICD-10-CM

## 2015-01-02 LAB — POCT GLYCOSYLATED HEMOGLOBIN (HGB A1C): Hemoglobin A1C: 7.2

## 2015-01-02 MED ORDER — METFORMIN HCL 1000 MG PO TABS: 1000.0000 mg | ORAL_TABLET | Freq: Two times a day (BID) | ORAL | Status: DC

## 2015-01-02 NOTE — Progress Notes (Signed)
   Subjective:    Patient ID: Maria Nunez, female    DOB: [birth year], 54 y.o.   MRN: [id number]  HPI  Diabetes - no hypoglycemic events. No wounds or sores that are not healing well. No increased thirst or urination. Checking glucose at home. Taking medications as prescribed without any side effects.  Hasn't been to the gym. Admits she has been stressed and has been eating candy.   Hypertension- Pt denies chest pain, SOB, dizziness, or heart palpitations.  Taking meds as directed w/o problems.  Denies medication side effects.    Elevated liver enzyme-her ALT was recently mildly elevated. She does not drink alcohol and rarely takes Tylenol.  Review of Systems     Objective:   Physical Exam  Constitutional: She is oriented to person, place, and time. She appears well-developed and well-nourished.  HENT:  Head: Normocephalic and atraumatic.  Cardiovascular: Normal rate, regular rhythm and normal heart sounds.   Pulmonary/Chest: Effort normal and breath sounds normal.  Neurological: She is alert and oriented to person, place, and time.  Skin: Skin is warm and dry.  Psychiatric: She has a normal mood and affect. Her behavior is normal.          Assessment & Plan:  DM - uncontrolled.  Says she has been eating candy and not working out.  She has lost a couple of pounds. She really wants to work on diet and exercise instead of adding a new medication.  /U in 3 months.    HTN - BP up a little today. Work on getting back on track with diet and exercise. Normally BP looks great.  F/U in 3 months.    Elevated liver enzymes - due to recheck levels. Discussed possible causes.

## 2015-01-26 LAB — HEPATIC FUNCTION PANEL
ALT: 48 U/L — ABNORMAL HIGH (ref 6–29)
AST: 32 U/L (ref 10–35)
Albumin: 4 g/dL (ref 3.6–5.1)
Alkaline Phosphatase: 71 U/L (ref 33–130)
Bilirubin, Direct: 0.1 mg/dL (ref <=0.2)
Indirect Bilirubin: 0.2 mg/dL (ref 0.2–1.2)
Total Bilirubin: 0.3 mg/dL (ref 0.2–1.2)
Total Protein: 6.8 g/dL (ref 6.1–8.1)

## 2015-01-29 ENCOUNTER — Other Ambulatory Visit: Payer: Self-pay | Admitting: *Deleted

## 2015-02-07 MED FILL — metFORMIN HCL 1000 MG TABS: 1000 | 90 days supply | Qty: 180 | Fill #0

## 2015-02-07 MED FILL — LOSARTAN-HCTZ 100-25 MG TAB: 100-25 | 90 days supply | Qty: 90 | Fill #3

## 2015-02-11 ENCOUNTER — Encounter: Payer: Self-pay | Admitting: Sports Medicine

## 2015-02-11 ENCOUNTER — Ambulatory Visit (INDEPENDENT_AMBULATORY_CARE_PROVIDER_SITE_OTHER): Payer: 59 | Admitting: Sports Medicine

## 2015-02-11 VITALS — BP 135/67 | HR 87 | Temp 98.5°F | Resp 16 | Wt 170.0 lb

## 2015-02-11 DIAGNOSIS — G5603 Carpal tunnel syndrome, bilateral upper limbs: Secondary | ICD-10-CM | POA: Diagnosis not present

## 2015-02-11 NOTE — Assessment & Plan Note (Signed)
Right worse than left, failed nighttime splinting, median nerve hydrodissection as above on the right side.  Return in one month.

## 2015-02-11 NOTE — Progress Notes (Signed)
  Subjective:    CC: Follow-up  HPI: I have not seen Maria Nunez in almost 2 years regarding her carpal tunnel syndrome, we started her off when conservative measures including NSAIDs and nighttime splinting. She comes back telling me that she has not yet improved, and continues to have numbness and tingling into both hands and wrists, right worse than left, she is eager to try interventional treatment today. Symptoms are moderate, persistent.  Past medical history, Surgical history, Family history not pertinant except as noted below, Social history, Allergies, and medications have been entered into the medical record, reviewed, and no changes needed.   Review of Systems: No fevers, chills, night sweats, weight loss, chest pain, or shortness of breath.   Objective:    General: Well Developed, well nourished, and in no acute distress.  Neuro: Alert and oriented x3, extra-ocular muscles intact, sensation grossly intact.  HEENT: Normocephalic, atraumatic, pupils equal round reactive to light, neck supple, no masses, no lymphadenopathy, thyroid nonpalpable.  Skin: Warm and dry, no rashes. Cardiac: Regular rate and rhythm, no murmurs rubs or gallops, no lower extremity edema.  Respiratory: Clear to auscultation bilaterally. Not using accessory muscles, speaking in full sentences. Right Wrist: Inspection normal with no visible erythema or swelling. ROM smooth and normal with good flexion and extension and ulnar/radial deviation that is symmetrical with opposite wrist. Palpation is normal over metacarpals, navicular, lunate, and TFCC; tendons without tenderness/ swelling No snuffbox tenderness. No tenderness over Canal of Guyon. Strength 5/5 in all directions without pain. Negative Finkelstein, tinel's and phalens, positive pain with Phalen's and Tinel sign but no reproduction of paresthesias. Negative Watson's test.  Procedure: Real-time Ultrasound Guided right median nerve  hydrodissection Device: GE Logiq E  Verbal informed consent obtained.  Time-out conducted.  Noted no overlying erythema, induration, or other signs of local infection.  Skin prepped in a sterile fashion.  Local anesthesia: Topical Ethyl chloride.  With sterile technique and under real time ultrasound guidance:  25-gauge needle advanced into the carpal tunnel taking care to avoid the radial artery, noted the ulnar artery sitting a few millimeters medial to the median nerve, injected medication both superficial to and deep to the median nerve, freeing it from surrounding structures and then redirected the needle deep into the carpal tunnel and further medication was injected for a total of 1 mL kenalog 40, 4 mL lidocaine. Completed without difficulty  Pain immediately resolved suggesting accurate placement of the medication.  Advised to call if fevers/chills, erythema, induration, drainage, or persistent bleeding.  Images permanently stored and available for review in the ultrasound unit.  Impression: Technically successful ultrasound guided injection.  Impression and Recommendations:

## 2015-02-18 ENCOUNTER — Encounter: Payer: Self-pay | Admitting: Pharmacist

## 2015-02-18 ENCOUNTER — Ambulatory Visit (INDEPENDENT_AMBULATORY_CARE_PROVIDER_SITE_OTHER): Payer: Self-pay | Admitting: Family Medicine

## 2015-02-18 VITALS — BP 122/76 | Wt 166.6 lb

## 2015-02-18 DIAGNOSIS — E119 Type 2 diabetes mellitus without complications: Secondary | ICD-10-CM

## 2015-02-18 NOTE — Progress Notes (Signed)
Subjective:  Patient presents today for 3 month diabetes follow-up as part of the employer-sponsored Link to Wellness program.  Current diabetes regimen includes metformin 1000 mg BID. Patient also continues on daily ARB, ASA and statin.  Most recent MD follow-up was in December with Dr. Madilyn Fireman. A1C at that point was 7.2% (increased). Patient has a pending appt for March to follow up. No med changes or major health changes at this time.   Patient has developed carpal tunnel in her right hand. She has had 1 shot of cortisone, but has not noticed any elevation in CBG.     Assessment:  Diabetes: Most recent A1C was 7.2  % which is exceeding goal of less than 7%. Weight is decreased from last visit with me by about 4 pounds.    CBG Review: Patient reports that she is checking CBG once daily fasting.  CBG averages are about 15-20 points higher than at last visit. Patient reports that she is not exercising and that this is why CBGs are higher.   High/Low- 186/113  Majority of recent fasting readings are less than 130. Denies hypoglycemia.   Lifestyle improvements:  Physical Activity-  Patient reports that she has not been doing any physical activity. Previously she was walking on the treadmill several days each week and going to the gym 3 days a week.   She admits that she has been under considerable stress at home and at work and has had zero motivation to exercise.   Spent a large portion of the visit discussing with patient motivations for physical activity and how she thought restarting physical activity would help with lowering CBGs and improving stress.   Nutrition-  Patient reports no major changes. She has lost 4 pounds since her last visit with me.    Follow up with me in 3 months.    Plan/Goals for Next Visit:  1. Start exercising again. Remember- this will help alleviate stress in your life. Start this Friday after your dental appointment. Aim for three days a week for  60 minutes.  2. Once you start exercising record your workouts in live life well so you can claim your wellness rewards.     Next appointment to see me is: Monday April 24th at [place] D. Donneta Romberg, PharmD, BCPS, CDE [facility] to [place] Coordinator [phone]

## 2015-03-01 NOTE — Progress Notes (Signed)
I have reviewed this pharmacist's note and agree  

## 2015-03-15 ENCOUNTER — Ambulatory Visit (INDEPENDENT_AMBULATORY_CARE_PROVIDER_SITE_OTHER): Payer: 59 | Admitting: Sports Medicine

## 2015-03-15 ENCOUNTER — Encounter: Payer: Self-pay | Admitting: Sports Medicine

## 2015-03-15 VITALS — BP 131/71 | HR 85 | Resp 18 | Wt 169.0 lb

## 2015-03-15 DIAGNOSIS — G5603 Carpal tunnel syndrome, bilateral upper limbs: Secondary | ICD-10-CM

## 2015-03-15 DIAGNOSIS — M1811 Unilateral primary osteoarthritis of first carpometacarpal joint, right hand: Secondary | ICD-10-CM | POA: Diagnosis not present

## 2015-03-15 NOTE — Assessment & Plan Note (Signed)
Diagnostic and therapeutic trapeziometacarpal joint injection as above.

## 2015-03-15 NOTE — Assessment & Plan Note (Signed)
Moderate response to a median nerve hydrodissection, carpal tunnel symptoms have improved significantly but still has some pain at the thumb basal joint.

## 2015-03-15 NOTE — Progress Notes (Signed)
  Subjective:    CC:  recheck carpal tunel  HPI: Carpal tunnel syndrome: significantly improved  After median nerve hydrodissection, still has some pain with gripping and opening doors, moderate, persistent and localized at the thumb basal joint without radiation.  Past medical history, Surgical history, Family history not pertinant except as noted below, Social history, Allergies, and medications have been entered into the medical record, reviewed, and no changes needed.   Review of Systems: No fevers, chills, night sweats, weight loss, chest pain, or shortness of breath.   Objective:    General: Well Developed, well nourished, and in no acute distress.  Neuro: Alert and oriented x3, extra-ocular muscles intact, sensation grossly intact.  HEENT: Normocephalic, atraumatic, pupils equal round reactive to light, neck supple, no masses, no lymphadenopathy, thyroid nonpalpable.  Skin: Warm and dry, no rashes. Cardiac: Regular rate and rhythm, no murmurs rubs or gallops, no lower extremity edema.  Respiratory: Clear to auscultation bilaterally. Not using accessory muscles, speaking in full sentences. Right Wrist: Inspection normal with no visible erythema or swelling. ROM smooth and normal with good flexion and extension and ulnar/radial deviation that is symmetrical with opposite wrist. Palpation is normal over metacarpals, navicular, lunate, and TFCC; tendons without tenderness/ swelling, tender to palpation at the thumb basal joint. No snuffbox tenderness. No tenderness over Canal of Guyon. Strength 5/5 in all directions without pain. Negative Finkelstein, tinel's and phalens. Negative Watson's test.  Procedure: Real-time Ultrasound Guided Injection of right trapeziometacarpal joint Device: GE Logiq E  Verbal informed consent obtained.  Time-out conducted.  Noted no overlying erythema, induration, or other signs of local infection.  Skin prepped in a sterile fashion.  Local  anesthesia: Topical Ethyl chloride.  With sterile technique and under real time ultrasound guidance:  1/2 mL lidocaine, 1/2 mL kenalog 40 injected easily. Completed without difficulty  Pain immediately resolved suggesting accurate placement of the medication.  Advised to call if fevers/chills, erythema, induration, drainage, or persistent bleeding.  Images permanently stored and available for review in the ultrasound unit.  Impression: Technically successful ultrasound guided injection.  Impression and Recommendations:    I spent 25 minutes with this patient, greater than 50% was face-to-face time counseling regarding the above diagnoses this time was separate from the time spent performing the procedure

## 2015-04-02 ENCOUNTER — Ambulatory Visit (INDEPENDENT_AMBULATORY_CARE_PROVIDER_SITE_OTHER): Payer: 59 | Admitting: Sports Medicine

## 2015-04-02 ENCOUNTER — Ambulatory Visit (INDEPENDENT_AMBULATORY_CARE_PROVIDER_SITE_OTHER): Payer: 59 | Admitting: Family Medicine

## 2015-04-02 ENCOUNTER — Encounter: Payer: Self-pay | Admitting: Sports Medicine

## 2015-04-02 VITALS — BP 134/55 | HR 87 | Resp 18 | Wt 170.0 lb

## 2015-04-02 VITALS — BP 134/52 | HR 87 | Wt 170.0 lb

## 2015-04-02 DIAGNOSIS — F410 Panic disorder [episodic paroxysmal anxiety] without agoraphobia: Secondary | ICD-10-CM

## 2015-04-02 DIAGNOSIS — E119 Type 2 diabetes mellitus without complications: Secondary | ICD-10-CM | POA: Diagnosis not present

## 2015-04-02 DIAGNOSIS — G5603 Carpal tunnel syndrome, bilateral upper limbs: Secondary | ICD-10-CM

## 2015-04-02 DIAGNOSIS — I10 Essential (primary) hypertension: Secondary | ICD-10-CM

## 2015-04-02 DIAGNOSIS — M1811 Unilateral primary osteoarthritis of first carpometacarpal joint, right hand: Secondary | ICD-10-CM | POA: Diagnosis not present

## 2015-04-02 DIAGNOSIS — F411 Generalized anxiety disorder: Secondary | ICD-10-CM | POA: Diagnosis not present

## 2015-04-02 LAB — POCT GLYCOSYLATED HEMOGLOBIN (HGB A1C): Hemoglobin A1C: 7.1

## 2015-04-02 MED ORDER — LOSARTAN POTASSIUM-HCTZ 100-25 MG PO TABS: 1.0000 | ORAL_TABLET | Freq: Every day | ORAL | Status: DC

## 2015-04-02 MED ORDER — DAPAGLIFLOZIN PROPANEDIOL 5 MG PO TABS: 5.0000 mg | ORAL_TABLET | Freq: Every day | ORAL | Status: DC

## 2015-04-02 MED ORDER — PRAVASTATIN SODIUM 40 MG PO TABS: ORAL_TABLET | ORAL | Status: DC

## 2015-04-02 MED ORDER — ESCITALOPRAM OXALATE 10 MG PO TABS: 10.0000 mg | ORAL_TABLET | Freq: Every day | ORAL | Status: DC

## 2015-04-02 MED FILL — PRAVASTATIN NA 40 MG TAB: 40 | 90 days supply | Qty: 90 | Fill #0

## 2015-04-02 MED FILL — FARXIGA 5 MG TABLET: 5 | 30 days supply | Qty: 30 | Fill #0

## 2015-04-02 MED FILL — ESCITALOPRAM 10 MG TABLET: 10 | 30 days supply | Qty: 30 | Fill #0

## 2015-04-02 NOTE — Assessment & Plan Note (Signed)
Resolved post injection.

## 2015-04-02 NOTE — Assessment & Plan Note (Signed)
Slight increase in pain with wrist extension however was not bad enough to consider operative intervention, she will work very hard with carpal tunnel rehabilitation exercises and return to see me on an as-needed basis.

## 2015-04-02 NOTE — Progress Notes (Signed)
Subjective:    Patient ID: Maria Nunez, female    DOB: 1960/08/02, 55 y.o.   MRN: [id number]  HPI Diabetes - no hypoglycemic events. No wounds or sores that are not healing well. No increased thirst or urination. Checking glucose at home. Taking medications as prescribed without any side effects.She started back to the gym this past weekend. She no she's been really off track with diet and exercise. She's also felt more down lately. She's been having some trouble at work and is actually considering finding a new job.  Hypertension- Pt denies chest pain, SOB, dizziness, or heart palpitations.  Taking meds as directed w/o problems.  Denies medication side effects.    She's been very stressed at work. She said she's at the point where she actually went to work Friday and started experiencing chest pain. She was him is having a panic attack and decided to just go home and set of stay at work and try to work through it. She would like to consider a medication to help her through for a while. She is not interested in any type of benzodiazepine or anything that can be habit forming. She does have a prior history of panic attacks.  Review of Systems     BP 134/52 mmHg  Pulse 87  Wt 170 lb (77.111 kg)  SpO2 97%    Allergies  Allergen Reactions  . Cefaclor   . Cephalexin   . Hycodan [Hydrocodone-Homatropine] Itching  . Januvia [Sitagliptin] Nausea Only    Back pain  . Penicillins   . Sulfonamide Derivatives     REACTION: hives  . Tetracycline     Past Medical History  Diagnosis Date  . Hypertension   . Allergy   . Hidradenitis   . Diabetes mellitus     borderline  . Fibroid   . Heart murmur     Past Surgical History  Procedure Laterality Date  . Cesarean section    . Inguinal hidradenitis excision    . Breast surgery      cysts removed right breast  . Abdominal hysterectomy      supracervical  . Leep  05/04/13    HPV change    Social History   Social History  .  Marital Status: Divorced    Spouse Name: N/A  . Number of Children: N/A  . Years of Education: N/A   Occupational History  . Not on file.   Social History Main Topics  . Smoking status: Never Smoker   . Smokeless tobacco: Never Used  . Alcohol Use: No  . Drug Use: No  . Sexual Activity: No     Comment: TAH/supracervical   Other Topics Concern  . Not on file   Social History Narrative    Family History  Problem Relation Age of Onset  . Asthma Mother   . COPD Mother   . Diabetes Mother   . Hypertension Mother   . Diabetes Father   . Hypertension Father   . Breast cancer Maternal Aunt   . Breast cancer Paternal Aunt     Outpatient Encounter Prescriptions as of 04/02/2015  Medication Sig  . aspirin 81 MG tablet Take 81 mg by mouth daily.    [name] FOLIC ACID PO Take [id number] mcg by mouth daily.   Elmore Guise Device MISC    . losartan-hydrochlorothiazide (HYZAAR) 100-25 MG tablet Take 1 tablet by mouth daily.  . metFORMIN (GLUCOPHAGE) 1000 MG tablet Take 1 tablet (1,000 mg total)  by mouth 2 (two) times daily with a meal.  . Multiple Vitamin (MULTIVITAMIN) tablet Take 1 tablet by mouth daily.    . Omega-3 Fatty Acids (FISH OIL) 1000 MG CAPS Take 1,000 mg by mouth daily.   . pravastatin (PRAVACHOL) 40 MG tablet TAKE 1 TABLET BY MOUTH ONCE DAILY  . TRUETEST TEST test strip TEST BLOOD SUGAR TWICE DAILY  . Zinc Sulfate (ZINC 15 PO) Take by mouth.    . [DISCONTINUED] losartan-hydrochlorothiazide (HYZAAR) 100-25 MG per tablet TAKE 1 TABLET BY MOUTH ONCE DAILY  . [DISCONTINUED] pravastatin (PRAVACHOL) 40 MG tablet TAKE 1 TABLET BY MOUTH ONCE DAILY (PATIENT NEEDS TO SCHEDULE A FOLLOW UP LAB WORK BEFORE MORE REFILLS)  . dapagliflozin propanediol (FARXIGA) 5 MG TABS tablet Take 5 mg by mouth daily.  [name] escitalopram (LEXAPRO) 10 MG tablet Take 1 tablet (10 mg total) by mouth daily.  . [DISCONTINUED] naproxen (NAPROSYN) 375 MG tablet Take 1 tablet (375 mg total) by mouth 2 (two) times daily with a  meal.  . [DISCONTINUED] vitamin B-12 (CYANOCOBALAMIN) 500 MCG tablet Take 500 mcg by mouth daily.   No facility-administered encounter medications on file as of 04/02/2015.       Objective:   Physical Exam  Constitutional: She is oriented to person, place, and time. She appears well-developed and well-nourished.  HENT:  Head: Normocephalic and atraumatic.  Cardiovascular: Normal rate, regular rhythm and normal heart sounds.   Pulmonary/Chest: Effort normal and breath sounds normal.  Neurological: She is alert and oriented to person, place, and time.  Skin: Skin is warm and dry.  Psychiatric: She has a normal mood and affect. Her behavior is normal.          Assessment & Plan:  DM- Uncontrolled. A1C is 7.1.  Discussed several options. She had not done well the Januvia in the past. We'll try low-dose [place]. Coupon card provided so she can try prefer one month. Encouraged her to make sure that she hydrates well. This may also help with weight loss. I'm hoping this will get her A1c down under 7. Continue metformin. We can consider doing a combination drug called Xigduo if she does well with this.  HTN - well controlled. Continue current regimen. Follow-up in 3 months.  Anxiety - we discussed several options. We discussed starting her on Lexapro. I will see her back in 6 weeks. Discussed potential side effects. Call if any problems or concerns.

## 2015-04-02 NOTE — Progress Notes (Signed)
  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  Past medical history, Surgical history, Family history not pertinant except as noted below, Social history, Allergies, and medications have been entered into the medical record, reviewed, and no changes needed.   Review of Systems: No fevers, chills, night sweats, weight loss, chest pain, or shortness of breath.   Objective:    General: Well Developed, well nourished, and in no acute distress.  Neuro: Alert and oriented x3, extra-ocular muscles intact, sensation grossly intact.  HEENT: Normocephalic, atraumatic, pupils equal round reactive to light, neck supple, no masses, no lymphadenopathy, thyroid nonpalpable.  Skin: Warm and dry, no rashes. Cardiac: Regular rate and rhythm, no murmurs rubs or gallops, no lower extremity edema.  Respiratory: Clear to auscultation bilaterally. Not using accessory muscles, speaking in full sentences.  Impression and Recommendations:

## 2015-04-04 MED FILL — TRUE METRIX GLUCOSE TEST ST: 90 days supply | Qty: 200 | Fill #1

## 2015-05-02 ENCOUNTER — Other Ambulatory Visit: Payer: Self-pay

## 2015-05-02 DIAGNOSIS — Z1231 Encounter for screening mammogram for malignant neoplasm of breast: Secondary | ICD-10-CM

## 2015-05-10 MED FILL — FARXIGA 5 MG TABLET: 5 | 30 days supply | Qty: 30 | Fill #1

## 2015-05-10 MED FILL — LOSARTAN-HCTZ 100-25 MG TAB: 100-25 | 90 days supply | Qty: 90 | Fill #0

## 2015-05-14 ENCOUNTER — Ambulatory Visit: Payer: 59 | Admitting: Family Medicine

## 2015-05-20 ENCOUNTER — Ambulatory Visit: Payer: Self-pay | Admitting: Pharmacist

## 2015-05-30 ENCOUNTER — Telehealth: Payer: Self-pay

## 2015-05-30 NOTE — Telephone Encounter (Signed)
Maria Nunez called and states that Dr Madilyn Fireman told her to call when she was in need of a refill. She states she was going to send in a combination tablet of Farxiga and Metformin. Please advise.

## 2015-05-31 MED ORDER — DAPAGLIFLOZIN PRO-METFORMIN ER 5-1000 MG PO TB24: 1.0000 | ORAL_TABLET | Freq: Every day | ORAL | Status: DC

## 2015-05-31 MED FILL — XIGDUO XR 5 MG-1,000 MG TAB: 5-1000 | 30 days supply | Qty: 30 | Fill #0

## 2015-05-31 NOTE — Telephone Encounter (Signed)
New rx sent. Make sure to use coupon card

## 2015-05-31 NOTE — Telephone Encounter (Signed)
Left detailed message on [atient vm with advise as noted below. Dalonda Simoni,CMA

## 2015-06-17 ENCOUNTER — Encounter: Payer: Self-pay | Admitting: Family Medicine

## 2015-06-17 ENCOUNTER — Ambulatory Visit (INDEPENDENT_AMBULATORY_CARE_PROVIDER_SITE_OTHER): Payer: 59 | Admitting: Family Medicine

## 2015-06-17 ENCOUNTER — Other Ambulatory Visit: Payer: Self-pay | Admitting: Pharmacist

## 2015-06-17 ENCOUNTER — Ambulatory Visit
Admission: RE | Admit: 2015-06-17 | Discharge: 2015-06-17 | Disposition: A | Discharge status: Home / Self Care | Payer: 59 | Source: Ambulatory Visit

## 2015-06-17 ENCOUNTER — Encounter: Payer: Self-pay | Admitting: Pharmacist

## 2015-06-17 VITALS — BP 118/82 | Wt 169.6 lb

## 2015-06-17 VITALS — BP 146/53 | HR 85 | Wt 171.0 lb

## 2015-06-17 DIAGNOSIS — R748 Abnormal levels of other serum enzymes: Secondary | ICD-10-CM | POA: Diagnosis not present

## 2015-06-17 DIAGNOSIS — I10 Essential (primary) hypertension: Secondary | ICD-10-CM | POA: Diagnosis not present

## 2015-06-17 DIAGNOSIS — E119 Type 2 diabetes mellitus without complications: Secondary | ICD-10-CM

## 2015-06-17 DIAGNOSIS — Z1231 Encounter for screening mammogram for malignant neoplasm of breast: Secondary | ICD-10-CM

## 2015-06-17 LAB — HEPATIC FUNCTION PANEL
ALT: 44 U/L — ABNORMAL HIGH (ref 6–29)
AST: 28 U/L (ref 10–35)
Albumin: 4.3 g/dL (ref 3.6–5.1)
Alkaline Phosphatase: 72 U/L (ref 33–130)
Bilirubin, Direct: 0.1 mg/dL (ref <=0.2)
Indirect Bilirubin: 0.3 mg/dL (ref 0.2–1.2)
Total Bilirubin: 0.4 mg/dL (ref 0.2–1.2)
Total Protein: 6.8 g/dL (ref 6.1–8.1)

## 2015-06-17 NOTE — Progress Notes (Signed)
   Subjective:    Patient ID: Maria Nunez, female    DOB: November 21, 1960, 55 y.o.   MRN: [id number]  HPI Diabetes - She is here for 6 week follow-up because her last 2 A1c's were elevated. no hypoglycemic events. No wounds or sores that are not healing well. No increased thirst or urination. Checking glucose at home. Taking medications as prescribed without any side effects. We have decided to try for seek a she had not done well with Januvia in the past. Also on metformin. Patient has eye exam scheduled for July 3. Her blood sugars were previously running in the 130s to 140s and now running more in the teens.  She also noticed that she lost some weight and she's not really exercising at this point.  Hypertension- Pt denies chest pain, SOB, dizziness, or heart palpitations.  Taking meds as directed w/o problems.  Denies medication side effects.    Elevated liver enzymes-2 like to go ahead and get blood work today to have that rechecked.  Review of Systems     Objective:   Physical Exam  Constitutional: She is oriented to person, place, and time. She appears well-developed and well-nourished.  HENT:  Head: Normocephalic and atraumatic.  Cardiovascular: Normal rate, regular rhythm and normal heart sounds.   Pulmonary/Chest: Effort normal and breath sounds normal.  Neurological: She is alert and oriented to person, place, and time.  Skin: Skin is warm and dry.  Psychiatric: She has a normal mood and affect. Her behavior is normal.          Assessment & Plan:  Diabetes-last A1c was 7.1. Eye exam scheduled. F/U in 6 weeks.  Reminded her the importance of getting back on track with diet and exercise. Unexcited she's lost some weight but I want to keep that momentum going.  HTN - Well controlled. Continue current regimen.   Elevated liver enzymes-due to recheck today.

## 2015-06-17 NOTE — Patient Outreach (Signed)
Subjective:  Patient presents today for 3 month diabetes follow-up as part of the employer-sponsored Link to Wellness program.  Current diabetes regimen includes Xigduo XR 05/998 mg once daily.   Patient also continues on daily ASA, ARB and statin.  Most recent MD follow-up was with Dr. Madilyn Fireman today. Last A1C was 7.1% in March.  Patient has a pending appt for July to see Dr. Madilyn Fireman. No major health changes at this time. One medication change- Wilder Glade was changed to Xigduo XR 05/998 mg once daily.     Assessment:  Diabetes: Most recent A1C was 7.1  % which is slightly exceeding goal of less than 7%. Weight is stable from last visit with me.    CBG Review: Patient states she is checking once or twice daily. Denies low blood sugars. Fasting is running 120-140, bedtime is running 140-180.   High/Low- 196/110 Lifestyle improvements:  Physical Activity-  She reports that she has walked the dog a few times and that is it. No regular exercise. She states that work has been crazy and she hasn't been exercising in the evening.   Nutrition-  She reports no changes. No cookies- doesn't buy anymore.    Follow up with me in 3 months.    Plan/Goals for Next Visit:  1. Start walking again. Aim for 4 days a week for 30 minutes. This will help give you energy in the evenings and help with your stress levels.      Next appointment to see me is: Monday August 14th at 4 PM.    Maria Nunez, PharmD, BCPS, CDE [facility] to [place] Coordinator [phone]

## 2015-06-18 ENCOUNTER — Other Ambulatory Visit: Payer: Self-pay | Admitting: Family Medicine

## 2015-06-18 DIAGNOSIS — N644 Mastodynia: Secondary | ICD-10-CM

## 2015-06-18 DIAGNOSIS — R234 Changes in skin texture: Secondary | ICD-10-CM

## 2015-06-18 NOTE — Addendum Note (Signed)
Addended by: Teddy Spike on: 06/18/2015 04:58 PM   Modules accepted: Orders

## 2015-06-25 ENCOUNTER — Ambulatory Visit
Admission: RE | Admit: 2015-06-25 | Discharge: 2015-06-25 | Disposition: A | Discharge status: Home / Self Care | Payer: 59 | Source: Ambulatory Visit | Attending: Family Medicine | Admitting: Family Medicine

## 2015-06-25 DIAGNOSIS — R234 Changes in skin texture: Secondary | ICD-10-CM

## 2015-06-25 DIAGNOSIS — N644 Mastodynia: Secondary | ICD-10-CM

## 2015-06-25 DIAGNOSIS — R922 Inconclusive mammogram: Secondary | ICD-10-CM | POA: Diagnosis not present

## 2015-07-10 MED FILL — XIGDUO XR 5 MG-1,000 MG TAB: 5-1000 | 30 days supply | Qty: 30 | Fill #1

## 2015-07-24 MED FILL — PRAVASTATIN NA 40 MG TAB: 40 | 90 days supply | Qty: 90 | Fill #1

## 2015-07-29 ENCOUNTER — Encounter: Payer: Self-pay | Admitting: Family Medicine

## 2015-07-29 ENCOUNTER — Ambulatory Visit (INDEPENDENT_AMBULATORY_CARE_PROVIDER_SITE_OTHER): Payer: 59 | Admitting: Family Medicine

## 2015-07-29 VITALS — BP 145/62 | HR 76 | Wt 173.0 lb

## 2015-07-29 DIAGNOSIS — E119 Type 2 diabetes mellitus without complications: Secondary | ICD-10-CM | POA: Diagnosis not present

## 2015-07-29 DIAGNOSIS — I10 Essential (primary) hypertension: Secondary | ICD-10-CM

## 2015-07-29 LAB — POCT GLYCOSYLATED HEMOGLOBIN (HGB A1C): Hemoglobin A1C: 6.7

## 2015-07-29 MED ORDER — DAPAGLIFLOZIN PRO-METFORMIN ER 5-1000 MG PO TB24: 1.0000 | ORAL_TABLET | Freq: Every day | ORAL | Status: DC

## 2015-07-29 NOTE — Progress Notes (Signed)
Subjective:    CC: DM, HTN  HPI: Diabetes - no hypoglycemic events. No wounds or sores that are not healing well. No increased thirst or urination. Checking glucose at home. Taking medications as prescribed without any side effects.She has had her eye exam since I last saw her. It was performed on July 3. Vision was 20/20 with no retinopathy in either eye. Will get her chart updated. Lab Results  Component Value Date   HGBA1C 7.1 04/02/2015     Hypertension- Pt denies chest pain, SOB, dizziness, or heart palpitations.  Taking meds as directed w/o problems.  Denies medication side effects.     Past medical history, Surgical history, Family history not pertinant except as noted below, Social history, Allergies, and medications have been entered into the medical record, reviewed, and corrections made.   Review of Systems: No fevers, chills, night sweats, weight loss, chest pain, or shortness of breath.   Objective:    General: Well Developed, well nourished, and in no acute distress.  Neuro: Alert and oriented x3, extra-ocular muscles intact, sensation grossly intact.  HEENT: Normocephalic, atraumatic  Skin: Warm and dry, no rashes. Cardiac: Regular rate and rhythm, no murmurs rubs or gallops, no lower extremity edema.  Respiratory: Clear to auscultation bilaterally. Not using accessory muscles, speaking in full sentences.   Impression and Recommendations:   DM- On statin and ARB.  Well-controlled. Hemoglobin A1c back down to 6.7 which is fantastic. We'll update chart for eye exam.  HTN - Uncontrolled. Blood pressure just up a little bit today. Asked her to have someone at work check in the next week or 2 and common back of her blood pressure stays up.

## 2015-08-07 MED FILL — LOSARTAN-HCTZ 100-25 MG TAB: 100-25 | 90 days supply | Qty: 90 | Fill #1

## 2015-08-07 MED FILL — XIGDUO XR 5 MG-1,000 MG TAB: 5-1000 | 30 days supply | Qty: 30 | Fill #2

## 2015-09-05 MED FILL — XIGDUO XR 5 MG-1,000 MG TAB: 5-1000 | 90 days supply | Qty: 90 | Fill #0

## 2015-09-06 ENCOUNTER — Ambulatory Visit (INDEPENDENT_AMBULATORY_CARE_PROVIDER_SITE_OTHER): Payer: 59 | Admitting: Sports Medicine

## 2015-09-06 ENCOUNTER — Encounter: Payer: Self-pay | Admitting: Sports Medicine

## 2015-09-06 DIAGNOSIS — M1811 Unilateral primary osteoarthritis of first carpometacarpal joint, right hand: Secondary | ICD-10-CM | POA: Diagnosis not present

## 2015-09-06 NOTE — Assessment & Plan Note (Signed)
It has been approximately 7 months since her last injection, repeat right carpometacarpal injection, return to see me as needed.

## 2015-09-06 NOTE — Progress Notes (Signed)
  Subjective:    CC: Right wrist pain  HPI: This is a pleasant 55 year old female with trapeziometacarpal osteoarthritis, last injection was 6-7 months ago and she desires repeat interventional treatment today, pain is moderate, persistent, localized at the carpometacarpal joint, no radiation, no other symptoms.  Past medical history, Surgical history, Family history not pertinant except as noted below, Social history, Allergies, and medications have been entered into the medical record, reviewed, and no changes needed.   Review of Systems: No fevers, chills, night sweats, weight loss, chest pain, or shortness of breath.   Objective:    General: Well Developed, well nourished, and in no acute distress.  Neuro: Alert and oriented x3, extra-ocular muscles intact, sensation grossly intact.  HEENT: Normocephalic, atraumatic, pupils equal round reactive to light, neck supple, no masses, no lymphadenopathy, thyroid nonpalpable.  Skin: Warm and dry, no rashes. Cardiac: Regular rate and rhythm, no murmurs rubs or gallops, no lower extremity edema.  Respiratory: Clear to auscultation bilaterally. Not using accessory muscles, speaking in full sentences.  Procedure: Real-time Ultrasound Guided Injection of right trapeziometacarpal joint Device: GE Logiq E  Verbal informed consent obtained.  Time-out conducted.  Noted no overlying erythema, induration, or other signs of local infection.  Skin prepped in a sterile fashion.  Local anesthesia: Topical Ethyl chloride.  With sterile technique and under real time ultrasound guidance:  30-gauge needle advanced into the joint and 1/2 mL kenalog 40, 1/2 mL lidocaine injected easily. Completed without difficulty  Pain immediately resolved suggesting accurate placement of the medication.  Advised to call if fevers/chills, erythema, induration, drainage, or persistent bleeding.  Images permanently stored and available for review in the ultrasound unit.    Impression: Technically successful ultrasound guided injection.  Impression and Recommendations:    Localized primary osteoarthritis of carpometacarpal joint of right thumb It has been approximately 7 months since her last injection, repeat right carpometacarpal injection, return to see me as needed.

## 2015-09-09 ENCOUNTER — Other Ambulatory Visit: Payer: Self-pay | Admitting: Pharmacist

## 2015-09-09 ENCOUNTER — Encounter: Payer: Self-pay | Admitting: Pharmacist

## 2015-09-09 VITALS — BP 132/68 | Wt 172.0 lb

## 2015-09-09 DIAGNOSIS — E119 Type 2 diabetes mellitus without complications: Secondary | ICD-10-CM

## 2015-09-09 NOTE — Patient Outreach (Signed)
Subjective:  Patient presents today for 3 month diabetes follow-up as part of the employer-sponsored [employer] [facility] program.  Current diabetes regimen includes Xigduo XR 05/998 mg daily. Patient also continues on daily ASA, ARB and statin.  Most recent MD follow-up was with Dr. Madilyn Fireman in July. A1C at that visit was 6.7%.Patient has a pending appt for October to see Dr. Madilyn Fireman again. No med changes or major health changes at this time.     Assessment:  Diabetes: Most recent A1C was 6.7 % which is at goal of less than 7%. Weight is stable from last visit with me.    CBG Review: Patient reports that she is checking fasting CBG three times a week. She did not bring her meter with her to the appointment. She reports that in the morning she is typically running in the 140s over the past few weeks. When she checks it in the evening she is running in the mid 100s.   Patient reports 1 episode of hypoglycemia- she did not have her meter with her so she does not know what her blood sugar was. She corrected with candy and felt better.   Per patient report- high.low- 180/95   Lifestyle improvements:  Physical Activity-  She has not been exercising lately due to her daughter moving out and helping get her daughter settled. Previously she was exercising at the gym. She cancelled her membership at the gym.   She has a treadmill and an elliptical machine at home. She wants to start walking on the treadmill again.    Nutrition-  No major changes. She states that she has continued to cut out sweets. She is eating breakfast and lunch always. She is eating dinner three times a week. Sometimes she is eating cereal or a sandwich. Also eating salads for dinner.   I cautioned her against skipping meals and recommended that she always eat something, however small, for dinner at night.    Follow up with me in 5 months.    Plan/Goals for Next Visit:  1. Start walking on the treadmill again. Aim for 3  days a week for 30-45 minutes.  2. Eat something for dinner- don't skip meals. Dinner meal can be small- even a piece of fruit with some protein.   Next appointment to see me is: Monday January 15th at 4 PM.    Jinny Blossom D. Donneta Romberg, PharmD, BCPS, CDE [facility] [place] Coordinator [phone]

## 2015-10-29 ENCOUNTER — Encounter: Payer: Self-pay | Admitting: Family Medicine

## 2015-10-29 ENCOUNTER — Ambulatory Visit (INDEPENDENT_AMBULATORY_CARE_PROVIDER_SITE_OTHER): Payer: 59 | Admitting: Family Medicine

## 2015-10-29 VITALS — BP 147/62 | HR 82 | Wt 172.0 lb

## 2015-10-29 DIAGNOSIS — H9201 Otalgia, right ear: Secondary | ICD-10-CM | POA: Diagnosis not present

## 2015-10-29 DIAGNOSIS — R748 Abnormal levels of other serum enzymes: Secondary | ICD-10-CM | POA: Diagnosis not present

## 2015-10-29 DIAGNOSIS — I10 Essential (primary) hypertension: Secondary | ICD-10-CM | POA: Diagnosis not present

## 2015-10-29 DIAGNOSIS — E119 Type 2 diabetes mellitus without complications: Secondary | ICD-10-CM

## 2015-10-29 LAB — POCT UA - MICROALBUMIN
Creatinine, POC: 200 mg/dL
Microalbumin Ur, POC: 80 mg/L

## 2015-10-29 LAB — POCT GLYCOSYLATED HEMOGLOBIN (HGB A1C): Hemoglobin A1C: 7

## 2015-10-29 MED ORDER — FLUTICASONE PROPIONATE 50 MCG/ACT NA SUSP: 2.0000 | Freq: Every day | NASAL | 2 refills | Status: DC

## 2015-10-29 MED FILL — FLUTICASONE PROP 50 MCG SPR: 50 | 30 days supply | Qty: 16 | Fill #0

## 2015-10-29 NOTE — Progress Notes (Signed)
Subjective:    CC: HTN, DM  HPI:  Hypertension- Pt denies chest pain, SOB, dizziness, or heart palpitations.  Taking meds as directed w/o problems.  Denies medication side effects.    Diabetes - no hypoglycemic events. No wounds or sores that are not healing well. No increased thirst or urination. Checking glucose at home. Taking medications as prescribed without any side effects. Had eye exam done at [facility] about a month ago. Will call for report.  She has been skipping dinner because her kids haven't been at home. Ever since she started doing as she noticed her morning blood sugars have been elevated. She said she spoke to the pharmacist and said that the elevated sugars are because she's skipping a meal.  Due to recheck elevated liver enzymes   Right ear pain/pressure. Thinks it may be allergy related but feels like her ear is in a box somedays. No drainage.  No URI or fever/chilss.     Past medical history, Surgical history, Family history not pertinant except as noted below, Social history, Allergies, and medications have been entered into the medical record, reviewed, and corrections made.   Review of Systems: No fevers, chills, night sweats, weight loss, chest pain, or shortness of breath.   Objective:    General: Well Developed, well nourished, and in no acute distress.  Neuro: Alert and oriented x3, extra-ocular muscles intact, sensation grossly intact.  HEENT: Normocephalic, atraumatic, Left TM and canal are clear. Right canal is clear. The right tympanic membrane is not erythematous but the light reflex is distorted laterally. Skin: Warm and dry, no rashes. Cardiac: Regular rate and rhythm, no murmurs rubs or gallops, no lower extremity edema.  Respiratory: Clear to auscultation bilaterally. Not using accessory muscles, speaking in full sentences.   Impression and Recommendations:   HTN - well controlled.  Due CMP and lipids.    DM- not as well controlled as  perviously.  Kercher eat dinner more regularly. She is probably getting a release of glucose from the liver overnight is causing elevated sugars first thing in the morning. Encouraged her to get back on track with this and see if the next A1c in 3 months is at goal.  On statin and ASA Lab Results  Component Value Date   HGBA1C 7.0 10/29/2015   Right otalgia-she does have a shifted light reflex indicating some pressure behind the eustachian tube. Recommend trial of a nasal steroid spray. If not feeling better after 2 weeks and please call me back.  Elevated liver enzymes - recheck

## 2015-10-29 NOTE — Patient Instructions (Signed)
Can try the fluticasone nasal spray daily for 2 weeks to see if helps you ear.  If not then let me know. Or if you ear gets worse call sooner.

## 2015-10-30 LAB — COMPLETE METABOLIC PANEL WITH GFR
ALT: 39 U/L — ABNORMAL HIGH (ref 6–29)
AST: 28 U/L (ref 10–35)
Albumin: 4.4 g/dL (ref 3.6–5.1)
Alkaline Phosphatase: 76 U/L (ref 33–130)
BUN: 14 mg/dL (ref 7–25)
CO2: 28 mmol/L (ref 20–31)
Calcium: 10 mg/dL (ref 8.6–10.4)
Chloride: 101 mmol/L (ref 98–110)
Creat: 0.7 mg/dL (ref 0.50–1.05)
GFR, Est African American: >89 mL/min (ref >=60)
GFR, Est Non African American: >89 mL/min (ref >=60)
Glucose, Bld: 146 mg/dL — ABNORMAL HIGH (ref 65–99)
Potassium: 3.7 mmol/L (ref 3.5–5.3)
Sodium: 140 mmol/L (ref 135–146)
Total Bilirubin: 0.5 mg/dL (ref 0.2–1.2)
Total Protein: 7.2 g/dL (ref 6.1–8.1)

## 2015-10-30 LAB — HEPATIC FUNCTION PANEL
ALT: 39 U/L — ABNORMAL HIGH (ref 6–29)
AST: 28 U/L (ref 10–35)
Albumin: 4.3 g/dL (ref 3.6–5.1)
Alkaline Phosphatase: 75 U/L (ref 33–130)
Bilirubin, Direct: 0.1 mg/dL (ref <=0.2)
Indirect Bilirubin: 0.4 mg/dL (ref 0.2–1.2)
Total Bilirubin: 0.5 mg/dL (ref 0.2–1.2)
Total Protein: 7.1 g/dL (ref 6.1–8.1)

## 2015-10-30 LAB — LIPID PANEL
Cholesterol: 191 mg/dL (ref 125–200)
HDL: 50 mg/dL (ref >=46)
LDL Cholesterol: 116 mg/dL (ref <130)
Total CHOL/HDL Ratio: 3.8 Ratio (ref <=5.0)
Triglycerides: 125 mg/dL (ref <150)
VLDL: 25 mg/dL (ref <30)

## 2015-11-13 NOTE — Progress Notes (Signed)
55 y.o. G95P2012 Divorced [name] here for annual exam.    Has persistent long term right breast pain.  Has had multiple breast biopsies in the past.  Next mammogram is due in May 2018.  Did blood work with PCP this month.  Hgb A1C 7.0.   Asking about pelvic ultrasound to check ovaries.  Prior ultrasound in 2016 showed the two ovaries adherent to one another.  No abdominal pain or change in bowel function.   PCP:   Beatrice Lecher, MD  Patient's last menstrual period was 01/27/2000 (approximate).           Sexually active: No. female The current method of family planning is status post hysterectomy--SUPRACERVICAL.    Exercising: Yes.    Treadmill Smoker:  no  Health Maintenance: Pap:  10-24-14 Neg:Neg HR HPV History of abnormal Pap:  Yes,  03/24/13 LSIL CIN I;Pos HR HPV. Leep 05/04/13 showing HPV effect only. No dysplasia. MMG:  06-25-15 Bil.Diag.with Rt.Br.US--Density C/Neg/screening 1year/[facility] Colonoscopy:  07-24-13 normal with Dr. Lenise Herald 06/2023. BMD:   N/A  Result  n/a TDaP: 2016 Gardasil:   N/A HIV: NR 03/23/14 Hep C: neg 03/23/14 Screening Labs:  Hb today: PCP, Urine today: unable to vooid   reports that she has never smoked. She has never used smokeless tobacco. She reports that she does not drink alcohol or use drugs.  Past Medical History:  Diagnosis Date  . Abnormal ultrasound of ovary 2016   Ultrasound showing bilateral ovaries in contact with one another.  Asymptomatic.  [name] Allergy   . Diabetes mellitus    borderline  . Fibroid   . Heart murmur   . Hidradenitis   . Hypertension     Past Surgical History:  Procedure Laterality Date  . ABDOMINAL HYSTERECTOMY     supracervical  . BREAST SURGERY     cysts removed right breast  . CESAREAN SECTION    . INGUINAL HIDRADENITIS EXCISION    . LEEP  05/04/13   HPV change    Current Outpatient Prescriptions  Medication Sig Dispense Refill  . aspirin 81 MG tablet Take 81 mg by mouth  daily.      . Cyanocobalamin (B-12) 500 MCG TABS Take 1 tablet by mouth daily.    . Dapagliflozin-Metformin HCl ER (XIGDUO XR) 05-998 MG TB24 Take 1 tablet by mouth daily. 90 tablet 3  . fluticasone (FLONASE) 50 MCG/ACT nasal spray Place 2 sprays into both nostrils daily. 16 g 2  . FOLIC ACID PO Take [id number] mcg by mouth daily.     [name] losartan-hydrochlorothiazide (HYZAAR) 100-25 MG tablet Take 1 tablet by mouth daily. 90 tablet PRN  . Multiple Vitamin (MULTIVITAMIN) tablet Take 1 tablet by mouth daily.      . Omega-3 Fatty Acids (FISH OIL) 1000 MG CAPS Take 1,000 mg by mouth daily.     . pravastatin (PRAVACHOL) 40 MG tablet TAKE 1 TABLET BY MOUTH ONCE DAILY 90 tablet 3  . pyridOXINE (VITAMIN B-6) 100 MG tablet Take 100 mg by mouth daily.    . TRUETEST TEST test strip TEST BLOOD SUGAR TWICE DAILY 200 each 2  . Zinc Sulfate (ZINC 15 PO) Take by mouth.       No current facility-administered medications for this visit.     Family History  Problem Relation Age of Onset  . Asthma Mother   . COPD Mother   . Diabetes Mother   . Hypertension Mother   . Diabetes Father   . Hypertension  Father   . Breast cancer Maternal Aunt   . Breast cancer Paternal Aunt     ROS:  Pertinent items are noted in HPI.  Otherwise, a comprehensive ROS was negative.  Exam:   BP 118/76 (BP Location: Right Arm, Patient Position: Sitting, Cuff Size: Normal)   Pulse 70   Resp 16   Ht 5' (1.524 m)   Wt 172 lb (78 kg)   LMP 01/27/2000 (Approximate)   BMI 33.59 kg/m     General appearance: alert, cooperative and appears stated age Head: Normocephalic, without obvious abnormality, atraumatic Neck: no adenopathy, supple, symmetrical, trachea midline and thyroid normal to inspection and palpation Lungs: clear to auscultation bilaterally Breasts: normal appearance, no masses or tenderness, No nipple retraction or dimpling, No nipple discharge or bleeding, No axillary or supraclavicular adenopathy Heart: regular rate and  rhythm Abdomen: soft, non-tender; no masses, no organomegaly Extremities: extremities normal, atraumatic, no cyanosis or edema Skin: Skin color, texture, turgor normal. No rashes or lesions Lymph nodes: Cervical, supraclavicular, and axillary nodes normal. No abnormal inguinal nodes palpated Neurologic: Grossly normal  Pelvic: External genitalia:  no lesions              Urethra:  normal appearing urethra with no masses, tenderness or lesions              Bartholins and Skenes: normal                 Vagina: normal appearing vagina with normal color and discharge, no lesions              Cervix: no lesions.  Scarring of os noted.               Pap taken: Yes.   Bimanual Exam:  Uterus:   absent              Adnexa: no mass, fullness, tenderness              Rectal exam: Yes.  .  Confirms.              Anus:  normal sphincter tone, no lesions  Chaperone was present for exam.  Assessment:   Well woman visit with normal exam. Hx supracervical hysterectomy.  Hx LGSIL pap.  LEEP showing HPV effect in 2015.  Status post right breast biopsies.  Abnormal ovaries on ultrasound.  Touching.  DM.  Plan: Yearly mammogram recommended after age 66.  Recommended self breast exam.  Pap and HR HPV as above. Discussed Calcium, Vitamin D, regular exercise program including cardiovascular and weight bearing exercise. Call for abdominal pain, nausea, vomiting, absence of bowel function.    Patient told that the potential adhesion of the ovaries can cause intra-abdominal pain and bowel compromise. Follow up annually and prn.      After visit summary provided.

## 2015-11-14 ENCOUNTER — Encounter: Payer: Self-pay | Admitting: Obstetrics and Gynecology

## 2015-11-14 ENCOUNTER — Ambulatory Visit: Payer: 59 | Admitting: Obstetrics and Gynecology

## 2015-11-14 VITALS — BP 118/76 | HR 70 | Resp 16 | Ht 60.0 in | Wt 172.0 lb

## 2015-11-14 DIAGNOSIS — Z01419 Encounter for gynecological examination (general) (routine) without abnormal findings: Secondary | ICD-10-CM

## 2015-11-14 DIAGNOSIS — Z1151 Encounter for screening for human papillomavirus (HPV): Secondary | ICD-10-CM | POA: Diagnosis not present

## 2015-11-14 MED FILL — PRAVASTATIN NA 40 MG TAB: 40 | 90 days supply | Qty: 90 | Fill #2

## 2015-11-14 MED FILL — LOSARTAN-HCTZ 100-25 MG TAB: 100-25 | 90 days supply | Qty: 90 | Fill #2

## 2015-11-14 NOTE — Patient Instructions (Signed)

## 2015-11-19 LAB — IPS PAP TEST WITH HPV

## 2015-12-11 MED FILL — XIGDUO XR 5 MG-1,000 MG TAB: 5-1000 | 90 days supply | Qty: 90 | Fill #1

## 2016-01-17 ENCOUNTER — Ambulatory Visit: Payer: 59 | Admitting: Sports Medicine

## 2016-01-24 ENCOUNTER — Ambulatory Visit (INDEPENDENT_AMBULATORY_CARE_PROVIDER_SITE_OTHER): Payer: 59

## 2016-01-24 ENCOUNTER — Ambulatory Visit (INDEPENDENT_AMBULATORY_CARE_PROVIDER_SITE_OTHER): Payer: 59 | Admitting: Sports Medicine

## 2016-01-24 DIAGNOSIS — M7552 Bursitis of left shoulder: Secondary | ICD-10-CM | POA: Insufficient documentation

## 2016-01-24 DIAGNOSIS — M719 Bursopathy, unspecified: Secondary | ICD-10-CM

## 2016-01-24 DIAGNOSIS — M25612 Stiffness of left shoulder, not elsewhere classified: Secondary | ICD-10-CM

## 2016-01-24 DIAGNOSIS — M25512 Pain in left shoulder: Secondary | ICD-10-CM | POA: Diagnosis not present

## 2016-01-24 DIAGNOSIS — M1811 Unilateral primary osteoarthritis of first carpometacarpal joint, right hand: Secondary | ICD-10-CM | POA: Diagnosis not present

## 2016-01-24 HISTORY — DX: Bursopathy, unspecified: M71.9

## 2016-01-24 MED ORDER — MELOXICAM 15 MG PO TABS: ORAL_TABLET | ORAL | 3 refills | Status: DC

## 2016-01-24 NOTE — Assessment & Plan Note (Signed)
4-1/2 month response to previous injection. Repeat right first carpometacarpal injection as above, return as needed.

## 2016-01-24 NOTE — Assessment & Plan Note (Addendum)
Meloxicam, x-rays, rehabilitation exercises. Return in one month, subacromial injection if no better.

## 2016-01-24 NOTE — Progress Notes (Signed)
   Subjective:    I'm seeing this patient as a consultation for:  Dr. 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 Moderate, persistent without mechanical symptoms.  Trapeziometacarpal joint osteoarthritis, right: 4 month response to previous injection, desires repeat injection today. Currently only using ibuprofen.  Past medical history:  Negative.  See flowsheet/record as well for more information.  Surgical history: Negative.  See flowsheet/record as well for more information.  Family history: Negative.  See flowsheet/record as well for more information.  Social history: Negative.  See flowsheet/record as well for more information.  Allergies, and medications have been entered into the medical record, reviewed, and no changes needed.   Review of Systems: No headache, visual changes, nausea, vomiting, diarrhea, constipation, dizziness, abdominal pain, skin rash, fevers, chills, night sweats, weight loss, swollen lymph nodes, body aches, joint swelling, muscle aches, chest pain, shortness of breath, mood changes, visual or auditory hallucinations.   Objective:   General: Well Developed, well nourished, and in no acute distress.  Neuro/Psych: Alert and oriented x3, extra-ocular muscles intact, able to move all 4 extremities, sensation grossly intact. Skin: Warm and dry, no rashes noted.  Respiratory: Not using accessory muscles, speaking in full sentences, trachea midline.  Cardiovascular: Pulses palpable, no extremity edema. Abdomen: Does not appear distended. Left Shoulder: Inspection reveals no abnormalities, atrophy or asymmetry. Palpation is normal with no tenderness over AC joint or bicipital groove. ROM is full in all planes. Rotator cuff strength  normal throughout. Positive Neer and Hawkin's tests, empty can. Speeds and Yergason's tests normal. No labral pathology noted with negative Obrien's, negative crank, negative clunk, and good stability. Normal scapular function observed. No painful arc and no drop arm sign. No apprehension sign  Procedure: Real-time Ultrasound Guided Injection of right trapeziometacarpal joint Device: GE Logiq E  Verbal informed consent obtained.  Time-out conducted.  Noted no overlying erythema, induration, or other signs of local infection.  Skin prepped in a sterile fashion.  Local anesthesia: Topical Ethyl chloride.  With sterile technique and under real time ultrasound guidance:  1/2 mL kenalog 40, 1/2 mL lidocaine injected easily. Completed without difficulty  Pain immediately resolved suggesting accurate placement of the medication.  Advised to call if fevers/chills, erythema, induration, drainage, or persistent bleeding.  Images permanently stored and available for review in the ultrasound unit.  Impression: Technically successful ultrasound guided injection.  Impression and Recommendations:   This case required medical decision making of moderate complexity.  Bursitis of left shoulder Meloxicam, x-rays, rehabilitation exercises. Return in one month, subacromial injection if no better.  Localized primary osteoarthritis of carpometacarpal joint of right thumb 4-1/2 month response to previous injection. Repeat right first carpometacarpal injection as above, return as needed.

## 2016-01-29 ENCOUNTER — Encounter: Payer: Self-pay | Admitting: Family Medicine

## 2016-01-29 ENCOUNTER — Ambulatory Visit (INDEPENDENT_AMBULATORY_CARE_PROVIDER_SITE_OTHER): Payer: 59 | Admitting: Family Medicine

## 2016-01-29 VITALS — BP 154/58 | HR 80 | Ht 60.0 in | Wt 173.0 lb

## 2016-01-29 DIAGNOSIS — I10 Essential (primary) hypertension: Secondary | ICD-10-CM | POA: Diagnosis not present

## 2016-01-29 DIAGNOSIS — E119 Type 2 diabetes mellitus without complications: Secondary | ICD-10-CM | POA: Diagnosis not present

## 2016-01-29 DIAGNOSIS — Z6833 Body mass index (BMI) 33.0-33.9, adult: Secondary | ICD-10-CM

## 2016-01-29 LAB — POCT GLYCOSYLATED HEMOGLOBIN (HGB A1C): Hemoglobin A1C: 7.4

## 2016-01-29 MED ORDER — DAPAGLIFLOZIN PRO-METFORMIN ER 10-1000 MG PO TB24: 1.0000 | ORAL_TABLET | Freq: Every day | ORAL | 1 refills | Status: DC

## 2016-01-29 MED FILL — XIGDUO XR 10 MG-1,000 MG TA: 10-1000 | 90 days supply | Qty: 90 | Fill #0

## 2016-01-29 NOTE — Progress Notes (Signed)
Subjective:    CC: DM  HPI:  Diabetes - no hypoglycemic events. No wounds or sores that are not healing well. No increased thirst or urination. Checking glucose at home. Taking medications as prescribed without any side effects.  Hypertension- Pt denies chest pain, SOB, dizziness, or heart palpitations.  Taking meds as directed w/o problems.  Denies medication side effects.   She reports her home blood pressures run in the 130s over 80s.  BMI 33-she plans on joining the well [place] program to help control her blood sugars and help promote her to exercise more regularly. She really wants to try to work on getting healthy this year.  Past medical history, Surgical history, Family history not pertinant except as noted below, Social history, Allergies, and medications have been entered into the medical record, reviewed, and corrections made.   Review of Systems: No fevers, chills, night sweats, weight loss, chest pain, or shortness of breath.   Objective:    General: Well Developed, well nourished, and in no acute distress.  Neuro: Alert and oriented x3, extra-ocular muscles intact, sensation grossly intact.  HEENT: Normocephalic, atraumatic  Skin: Warm and dry, no rashes. Cardiac: Regular rate and rhythm, no murmurs rubs or gallops, no lower extremity edema.  Respiratory: Clear to auscultation bilaterally. Not using accessory muscles, speaking in full sentences.   Impression and Recommendations:    DM- Uncontrolled.  Discussed options. Will increase her Xigduo to 10/998. New perception sent to pharmacy. She is also planning on joining the well [facility] program which is through a mobile at to help her get better control of her diabetes and help her exercise more readily. I think this is fantastic and will hopefully make a big difference in her numbers. Lab Results  Component Value Date   HGBA1C 7.4 01/29/2016     HTN - Uncontrolled today. The strategies to get this under better control.  She's currently on losartan HCTZ at the maximum dose. She really wants to get 3 more months to work on lifestyle changes. If not goal at that time then we'll need to add a second medication such as a beta blocker.  BMI 33/obesity-discussed strategies to get back on track with healthy diet and exercise.

## 2016-02-05 MED ORDER — GLUCOSE BLOOD VI STRP: ORAL_STRIP | 12 refills | Status: DC

## 2016-02-05 MED ORDER — ACCU-CHEK SOFTCLIX LANCET DEV MISC: 11 refills | Status: DC

## 2016-02-05 NOTE — Addendum Note (Signed)
Addended by: Teddy Spike on: 02/05/2016 04:34 PM   Modules accepted: Orders

## 2016-02-10 ENCOUNTER — Ambulatory Visit: Payer: Self-pay | Admitting: Pharmacist

## 2016-02-21 ENCOUNTER — Encounter: Payer: Self-pay | Admitting: Sports Medicine

## 2016-02-21 ENCOUNTER — Ambulatory Visit (INDEPENDENT_AMBULATORY_CARE_PROVIDER_SITE_OTHER): Payer: 59 | Admitting: Sports Medicine

## 2016-02-21 DIAGNOSIS — M7552 Bursitis of left shoulder: Secondary | ICD-10-CM

## 2016-02-21 DIAGNOSIS — M1811 Unilateral primary osteoarthritis of first carpometacarpal joint, right hand: Secondary | ICD-10-CM

## 2016-02-21 NOTE — Progress Notes (Addendum)
  Subjective:    CC: Follow-up  HPI: Left shoulder pain: Subacromial impingement related symptoms, moderate, persistent, did not do her rehabilitation exercises, not surprisingly is still hurting. Pain is moderate, persistent, localized over the deltoid, ADH to the elbow and is worse with overhead activities. She desires interventional treatment today.  Right thumb basal joint arthritis: Did well for several years with injections, unfortunately injections are no longer lasting and she is agreeable to discuss thumb suspension with hand surgery.  Past medical history:  Negative.  See flowsheet/record as well for more information.  Surgical history: Negative.  See flowsheet/record as well for more information.  Family history: Negative.  See flowsheet/record as well for more information.  Social history: Negative.  See flowsheet/record as well for more information.  Allergies, and medications have been entered into the medical record, reviewed, and no changes needed.   Review of Systems: No fevers, chills, night sweats, weight loss, chest pain, or shortness of breath.   Objective:    General: Well Developed, well nourished, and in no acute distress.  Neuro: Alert and oriented x3, extra-ocular muscles intact, sensation grossly intact.  HEENT: Normocephalic, atraumatic, pupils equal round reactive to light, neck supple, no masses, no lymphadenopathy, thyroid nonpalpable.  Skin: Warm and dry, no rashes. Cardiac: Regular rate and rhythm, no murmurs rubs or gallops, no lower extremity edema.  Respiratory: Clear to auscultation bilaterally. Not using accessory muscles, speaking in full sentences. Left Shoulder: Inspection reveals no abnormalities, atrophy or asymmetry. Palpation is normal with no tenderness over AC joint or bicipital groove. ROM is full in all planes. Rotator cuff strength normal throughout. Positive Neer and Hawkin's tests, empty can. Speeds and Yergason's tests normal. No  labral pathology noted with negative Obrien's, negative crank, negative clunk, and good stability. Normal scapular function observed. No painful arc and no drop arm sign. No apprehension sign  Procedure: Real-time Ultrasound Guided Injection of left subacromial bursa Device: GE Logiq E  Verbal informed consent obtained.  Time-out conducted.  Noted no overlying erythema, induration, or other signs of local infection.  Skin prepped in a sterile fashion.  Local anesthesia: Topical Ethyl chloride.  With sterile technique and under real time ultrasound guidance:  1 mL Kenalog 40, 1 mL lidocaine, 1 mL Marcaine injected easily. Completed without difficulty  Pain immediately resolved suggesting accurate placement of the medication.  Advised to call if fevers/chills, erythema, induration, drainage, or persistent bleeding.  Images permanently stored and available for review in the ultrasound unit.  Impression: Technically successful ultrasound guided injection.  Impression and Recommendations:    Bursitis of left shoulder Didn't do the rehabilitation exercises and not surprisingly is still hurting. Subacromial injection as above, she promises to get more aggressive with her rehabilitation. When she returns if she still hurting I am going to place her into formal physical therapy.  Localized primary osteoarthritis of carpometacarpal joint of right thumb Has done well with years of injections, now agreeable to discuss this with hand surgery.

## 2016-02-21 NOTE — Assessment & Plan Note (Signed)
Has done well with years of injections, now agreeable to discuss this with hand surgery.

## 2016-02-21 NOTE — Assessment & Plan Note (Signed)
Didn't do the rehabilitation exercises and not surprisingly is still hurting. Subacromial injection as above, she promises to get more aggressive with her rehabilitation. When she returns if she still hurting I am going to place her into formal physical therapy.

## 2016-02-21 NOTE — Addendum Note (Signed)
Addended by: Silverio Decamp on: 02/21/2016 03:39 PM   Modules accepted: Orders

## 2016-03-04 MED FILL — LOSARTAN-HCTZ 100-25 MG TAB: 100-25 | 90 days supply | Qty: 90 | Fill #3

## 2016-03-04 MED FILL — PRAVASTATIN NA 40 MG TAB: 40 | 90 days supply | Qty: 90 | Fill #3

## 2016-04-14 ENCOUNTER — Ambulatory Visit (INDEPENDENT_AMBULATORY_CARE_PROVIDER_SITE_OTHER): Payer: Self-pay | Admitting: Nurse Practitioner

## 2016-04-14 ENCOUNTER — Encounter: Payer: Self-pay | Admitting: Nurse Practitioner

## 2016-04-14 VITALS — BP 130/72 | HR 84 | Temp 98.5°F | Wt 173.2 lb

## 2016-04-14 DIAGNOSIS — J019 Acute sinusitis, unspecified: Secondary | ICD-10-CM

## 2016-04-14 MED ORDER — FLUTICASONE PROPIONATE 50 MCG/ACT NA SUSP: 2.0000 | Freq: Every day | NASAL | 0 refills | Status: DC

## 2016-04-14 MED ORDER — MONTELUKAST SODIUM 10 MG PO TABS: 10.0000 mg | ORAL_TABLET | Freq: Every day | ORAL | 3 refills | Status: DC

## 2016-04-14 MED ORDER — LEVOFLOXACIN 500 MG PO TABS: 500.0000 mg | ORAL_TABLET | Freq: Every day | ORAL | 0 refills | Status: AC

## 2016-04-14 MED FILL — levoFLOXacin 500 MG TABS: 500 | 7 days supply | Qty: 7 | Fill #0

## 2016-04-14 MED FILL — FLUTICASONE PROP 50 MCG SPR: 50 | 30 days supply | Qty: 16 | Fill #0

## 2016-04-14 MED FILL — MONTELUKAST SOD 10 MG TAB: 10 | 30 days supply | Qty: 30 | Fill #0

## 2016-04-14 NOTE — Patient Instructions (Addendum)

## 2016-04-14 NOTE — Progress Notes (Signed)
Subjective:  Maria Nunez is a 56 y.o. female who presents for evaluation of possible sinusitis.  Symptoms include congestion, fever: suspected fevers but not measured at home, headache described as dull, aching, rates 6/10, nasal congestion, post nasal drip and sore throat.  Onset of symptoms was 3 months ago, and has been gradually worsening since that time.  Treatment to date:  none.  High risk factors for influenza complications:  history of seasonal allergies.  Patient did yardwork on Saturday, symptoms have worsened since that time.    The following portions of the patient's history were reviewed and updated as appropriate:  allergies, current medications and past medical history.  Constitutional: positive for fevers, negative for chills and fatigue Eyes: negative Ears, nose, mouth, throat, and face: positive for earaches, nasal congestion and left ear, negative for ear drainage and hearing loss Respiratory: positive for cough Cardiovascular: negative Gastrointestinal: positive for nausea Neurological: positive for headaches and described as dull and achy, rates 6/10 Allergic/Immunologic: positive for hay fever Objective:  BP 130/72   Pulse 84   Temp 98.5 F (36.9 C)   Wt 173 lb 3.2 oz (78.6 kg)   LMP 01/27/2000 (Approximate)   SpO2 94%   BMI 33.83 kg/m  General appearance: alert and cooperative Head: Normocephalic, without obvious abnormality, atraumatic Eyes: conjunctivae/corneas clear. PERRL, EOM's intact. Fundi benign. Ears: normal TM's and external ear canals both ears Nose: clear discharge, turbinates red, no sinus tenderness Throat: lips, mucosa, and tongue normal; teeth and gums normal Lungs: clear to auscultation bilaterally Heart: regular rate and rhythm, S1, S2 normal, no murmur, click, rub or gallop Abdomen: soft, non-tender; bowel sounds normal; no masses,  no organomegaly Neurologic: Alert and oriented X 3, normal strength and tone. Normal symmetric  reflexes. Normal coordination and gait    Assessment:  sinusitis    Plan:  Discussed diagnosis and treatment of sinusitis. Suggested symptomatic OTC remedies. Nasal steroids per orders. Call in 2-3 days days if symptoms aren't resolving.

## 2016-04-15 ENCOUNTER — Other Ambulatory Visit: Payer: Self-pay | Admitting: *Deleted

## 2016-04-15 MED ORDER — GLUCOSE BLOOD VI STRP: ORAL_STRIP | 12 refills | Status: DC

## 2016-04-15 MED FILL — ACCU-CHEK GUIDE TEST STRIP: 50 days supply | Qty: 100 | Fill #0

## 2016-04-16 ENCOUNTER — Telehealth: Payer: Self-pay | Admitting: Nurse Practitioner

## 2016-04-16 MED FILL — XIGDUO XR 5 MG-1,000 MG TAB: 5-1000 | 90 days supply | Qty: 90 | Fill #2

## 2016-04-16 NOTE — Telephone Encounter (Signed)
Called patient to follow up on her status.  Patient states, "I feel much better".  Patient will follow up as needed.

## 2016-04-28 ENCOUNTER — Ambulatory Visit: Payer: 59 | Admitting: Family Medicine

## 2016-04-30 ENCOUNTER — Encounter: Payer: Self-pay | Admitting: Family Medicine

## 2016-04-30 ENCOUNTER — Ambulatory Visit (INDEPENDENT_AMBULATORY_CARE_PROVIDER_SITE_OTHER): Payer: 59 | Admitting: Family Medicine

## 2016-04-30 VITALS — BP 145/55 | HR 81 | Ht 60.0 in | Wt 174.0 lb

## 2016-04-30 DIAGNOSIS — R51 Headache: Secondary | ICD-10-CM

## 2016-04-30 DIAGNOSIS — R519 Headache, unspecified: Secondary | ICD-10-CM

## 2016-04-30 MED ORDER — PREDNISONE 10 MG PO TABS: ORAL_TABLET | ORAL | 0 refills | Status: DC

## 2016-04-30 NOTE — Progress Notes (Signed)
Subjective:    Patient ID: Maria Nunez, female    DOB: 10-31-1960, 57 y.o.   MRN: [id number]  HPI 56 year old female with a history diabetes comes in today complaining of a headache with nausea. Started 4 days ago with pressure on the top of her head. Gradually got worse. IBU helps some.  No fever, cough or other URI sxs. Had migraines years ago but says this feels different.  She has been very nauseated but no vomiting.  No fever.  Pain is 2/10 today.    Review of Systems     BP (!) 145/55   Pulse 81   Ht 5' (1.524 m)   Wt 174 lb (78.9 kg)   LMP 01/27/2000 (Approximate)   SpO2 95%   BMI 33.98 kg/m     Allergies  Allergen Reactions  . Cefaclor   . Cephalexin   . Hycodan [Hydrocodone-Homatropine] Itching  . Januvia [Sitagliptin] Nausea Only    Back pain  . Penicillins   . Sulfonamide Derivatives     REACTION: hives  . Tetracycline     Past Medical History:  Diagnosis Date  . Abnormal ultrasound of ovary 2016   Ultrasound showing bilateral ovaries in contact with one another.  Asymptomatic.  [name] Allergy   . Diabetes mellitus    borderline  . Fibroid   . Heart murmur   . Hidradenitis   . Hypertension     Past Surgical History:  Procedure Laterality Date  . ABDOMINAL HYSTERECTOMY     supracervical  . BREAST SURGERY     cysts removed right breast  . CESAREAN SECTION    . INGUINAL HIDRADENITIS EXCISION    . LEEP  05/04/13   HPV change    Social History   Social History  . Marital status: Divorced    Spouse name: N/A  . Number of children: N/A  . Years of education: N/A   Occupational History  . Not on file.   Social History Main Topics  . Smoking status: Never Smoker  . Smokeless tobacco: Never Used  . Alcohol use No  . Drug use: No  . Sexual activity: No     Comment: TAH/supracervical   Other Topics Concern  . Not on file   Social History Narrative  . No narrative on file    Family History  Problem Relation Age of Onset  . Asthma  Mother   . COPD Mother   . Diabetes Mother   . Hypertension Mother   . Diabetes Father   . Hypertension Father   . Breast cancer Maternal Aunt   . Breast cancer Paternal Aunt     Outpatient Encounter Prescriptions as of 04/30/2016  Medication Sig  . aspirin 81 MG tablet Take 81 mg by mouth daily.    . Blood Glucose Monitoring Suppl (FREESTYLE LITE) DEVI   . Cyanocobalamin (B-12) 500 MCG TABS Take 1 tablet by mouth daily.  [name] FOLIC ACID PO Take 010 mcg by mouth daily.   [name] glucose blood test strip accu chek strips. For testing twice daily. DX: E11.9  . Lancet Devices (ACCU-CHEK SOFTCLIX) lancets Use as instructed  . Lancets (FREESTYLE) lancets   . losartan-hydrochlorothiazide (HYZAAR) 100-25 MG tablet Take 1 tablet by mouth daily.  . meloxicam (MOBIC) 15 MG tablet One tab PO qAM with breakfast for 2 weeks, then daily prn pain.  . montelukast (SINGULAIR) 10 MG tablet Take 1 tablet (10 mg total) by mouth at bedtime.  . Multiple Vitamin (MULTIVITAMIN)  tablet Take 1 tablet by mouth daily.    . Omega-3 Fatty Acids (FISH OIL) 1000 MG CAPS Take 1,000 mg by mouth daily.   . pravastatin (PRAVACHOL) 40 MG tablet TAKE 1 TABLET BY MOUTH ONCE DAILY  . pyridOXINE (VITAMIN B-6) 100 MG tablet Take 100 mg by mouth daily.  [name] XIGDUO XR 05-998 MG TB24   . Zinc Sulfate (ZINC 15 PO) Take by mouth.    . fluticasone (FLONASE) 50 MCG/ACT nasal spray Place 2 sprays into both nostrils daily.  . predniSONE (DELTASONE) 10 MG tablet 8 tabs po Day 1, 6 tabs po Day 2, 4 tabs po Day 3, 2 tab Day 4, 1 tab Day 5  . [DISCONTINUED] Dapagliflozin-Metformin HCl ER (XIGDUO XR) 10-998 MG TB24 Take 1 tablet by mouth daily with breakfast.   No facility-administered encounter medications on file as of 04/30/2016.       Objective:   Physical Exam  Constitutional: She is oriented to person, place, and time. She appears well-developed and well-nourished.  HENT:  Head: Normocephalic and atraumatic.  Right Ear: External ear  normal.  Left Ear: External ear normal.  Nose: Nose normal.  Mouth/Throat: Oropharynx is clear and moist.  TMs and canals are clear.   Eyes: Conjunctivae and EOM are normal. Pupils are equal, round, and reactive to light. Right eye exhibits no discharge. Left eye exhibits no discharge.  Neck: Neck supple. No thyromegaly present.  Cardiovascular: Normal rate, regular rhythm and normal heart sounds.   Pulmonary/Chest: Effort normal and breath sounds normal. She has no wheezes.  Lymphadenopathy:    She has no cervical adenopathy.  Neurological: She is alert and oriented to person, place, and time. No cranial nerve deficit.  Skin: Skin is warm and dry.  Psychiatric: She has a normal mood and affect.       Assessment & Plan:  HA x 4 days - will tx with prednisone taper.  Encouraged her to hydrate well and get some rest over the next couple of days. If she's not significantly better by Monday to give [place] a call back. She's also been using Advil PM at night to help her sleep. If she develops any fever or new respiratory type symptoms and please let me know. She has had some more allergic type symptoms over the last couple weeks but is Artie on treatment for that.

## 2016-05-06 ENCOUNTER — Ambulatory Visit: Payer: 59 | Admitting: Family Medicine

## 2016-05-06 LAB — HM DIABETES EYE EXAM

## 2016-05-08 ENCOUNTER — Encounter: Payer: Self-pay | Admitting: Family Medicine

## 2016-05-12 ENCOUNTER — Other Ambulatory Visit: Payer: Self-pay | Admitting: Family Medicine

## 2016-05-12 DIAGNOSIS — Z1231 Encounter for screening mammogram for malignant neoplasm of breast: Secondary | ICD-10-CM

## 2016-05-18 ENCOUNTER — Telehealth: Payer: Self-pay | Admitting: *Deleted

## 2016-05-18 NOTE — Telephone Encounter (Signed)
Pt called and lvm stating that she had an allergic reaction to something so she took 2 benadryl about 1130 last night and it caused her bp to go up. She reports that she is still itching and red and doesn't want to take any more benadryl. She said that she does have some claritin on hand and wanted to know if it would be ok for her to take this.  Spoke w/Dr. Madilyn Fireman she stated that she can take the claritin BID. Advised if no better to call to schedule appt. Pt agreed .Audelia Hives [place]

## 2016-05-28 ENCOUNTER — Ambulatory Visit: Payer: 59 | Admitting: Family Medicine

## 2016-06-12 ENCOUNTER — Ambulatory Visit (INDEPENDENT_AMBULATORY_CARE_PROVIDER_SITE_OTHER): Payer: 59 | Admitting: Family Medicine

## 2016-06-12 ENCOUNTER — Encounter: Payer: Self-pay | Admitting: Family Medicine

## 2016-06-12 ENCOUNTER — Other Ambulatory Visit: Payer: Self-pay | Admitting: *Deleted

## 2016-06-12 VITALS — BP 126/79 | HR 73 | Ht 60.0 in | Wt 170.0 lb

## 2016-06-12 DIAGNOSIS — T148XXA Other injury of unspecified body region, initial encounter: Secondary | ICD-10-CM

## 2016-06-12 DIAGNOSIS — T887XXA Unspecified adverse effect of drug or medicament, initial encounter: Secondary | ICD-10-CM | POA: Diagnosis not present

## 2016-06-12 DIAGNOSIS — I10 Essential (primary) hypertension: Secondary | ICD-10-CM

## 2016-06-12 DIAGNOSIS — L509 Urticaria, unspecified: Secondary | ICD-10-CM

## 2016-06-12 DIAGNOSIS — E119 Type 2 diabetes mellitus without complications: Secondary | ICD-10-CM

## 2016-06-12 DIAGNOSIS — T50905A Adverse effect of unspecified drugs, medicaments and biological substances, initial encounter: Secondary | ICD-10-CM

## 2016-06-12 LAB — POCT GLYCOSYLATED HEMOGLOBIN (HGB A1C): Hemoglobin A1C: 10.4

## 2016-06-12 MED ORDER — MUPIROCIN 2 % EX OINT: TOPICAL_OINTMENT | Freq: Two times a day (BID) | CUTANEOUS | 0 refills | Status: DC

## 2016-06-12 MED ORDER — LIRAGLUTIDE 18 MG/3ML ~~LOC~~ SOPN: 0.6000 mg | PEN_INJECTOR | Freq: Every day | SUBCUTANEOUS | 3 refills | Status: DC

## 2016-06-12 MED ORDER — METFORMIN HCL 1000 MG PO TABS: 1000.0000 mg | ORAL_TABLET | Freq: Two times a day (BID) | ORAL | 3 refills | Status: DC

## 2016-06-12 MED ORDER — INSULIN PEN NEEDLE 32G X 4 MM MISC: 99 refills | Status: DC

## 2016-06-12 MED FILL — metFORMIN HCL 1000 MG TABS: 1000 | 90 days supply | Qty: 180 | Fill #0

## 2016-06-12 NOTE — Addendum Note (Signed)
Addended by: Beatrice Lecher D on: 06/12/2016 05:17 PM   Modules accepted: Orders

## 2016-06-12 NOTE — Progress Notes (Addendum)
Subjective:    CC: DM, HTN  HPI:  Diabetes - no hypoglycemic events. No wounds or sores that are not healing well. No increased thirst or urination. Checking glucose at home.Home sugars have been running between the 130s up to 200s. They've been much higher than usual. Taking medications as prescribed without any side effects.She previously been taking Xigduo 5 mg by accident. She thought she was actually taking the 10 mg but she wasn't. Then when she found the 10 mg tabs and hurt for about a week ago she started taking them but soon after starting the higher dose she actually started to break out in hives. She's been using Benadryl. She did take 10 mg for almost a week. She still has a large hivesAnd felt that her lips were very dry. She has a hive on her forehead right now. She denies any chest pain shortness of breath or throat tightness or swelling on the medication.  Hypertension- Pt denies chest pain, SOB, dizziness, or heart palpitations.  Taking meds as directed w/o problems.  Denies medication side effects.    She also may look at her left earlobe. She's been getting some crusting and scabbing over the hole where her ears pierced. She's been cleaning her earrings well and soaking them and then using a little bit of Neosporin on it. Her right ear is not affected at all.  Past medical history, Surgical history, Family history not pertinant except as noted below, Social history, Allergies, and medications have been entered into the medical record, reviewed, and corrections made.   Review of Systems: No fevers, chills, night sweats, weight loss, chest pain, or shortness of breath.   Objective:    General: Well Developed, well nourished, and in no acute distress.  Neuro: Alert and oriented x3, extra-ocular muscles intact, sensation grossly intact.  HEENT: Normocephalic, atraumatic  Skin: Warm and dry, no rashes. She does have a large hive on her forehead. She does have some dry skin and  scabbing around her peircing on the lobe of the left ear. No sign of erythema, induration, or drainage Cardiac: Regular rate and rhythm, no murmurs rubs or gallops, no lower extremity edema.  Respiratory: Clear to auscultation bilaterally. Not using accessory muscles, speaking in full sentences.   Impression and Recommendations:     DM - A1c 10.6 today which is uncontrolled. Partly because she was accidentally taking the lower dose of her old prescription.. She has been exercising and feels like she's been doing a good job with her diet. Added Xigduo to her intolerance list. We'll continue with the metformin component as I do not feel that she is allergic to that and will start Victoza. Discussed medication and potential side effects. I want to see her back in 6 weeks to make sure that we are getting back on track. Continue to work on healthy diet and regular exercise. Make sure drinking plenty of water.  HTN - Well controlled. Continue current regimen. Follow up in  3-4 months.   Medication reaction-hives. Can use Benadryl as needed. Stop medication.  Scabbing and crusting of piercing site on the left ear-recommend a trial mupirocin ointment. If not better after one week and please let me know. Continue to cleanse earrings very well. It could be that she is starting to develop a metal allergy. No sign of abscess.

## 2016-06-13 LAB — BASIC METABOLIC PANEL WITH GFR
BUN: 15 mg/dL (ref 7–25)
CO2: 23 mmol/L (ref 20–31)
Calcium: 10.1 mg/dL (ref 8.6–10.4)
Chloride: 100 mmol/L (ref 98–110)
Creat: 0.67 mg/dL (ref 0.50–1.05)
GFR, Est African American: >89 mL/min (ref >=60)
GFR, Est Non African American: >89 mL/min (ref >=60)
Glucose, Bld: 109 mg/dL — ABNORMAL HIGH (ref 65–99)
Potassium: 3.5 mmol/L (ref 3.5–5.3)
Sodium: 140 mmol/L (ref 135–146)

## 2016-06-15 ENCOUNTER — Other Ambulatory Visit: Payer: Self-pay | Admitting: Family Medicine

## 2016-06-15 MED FILL — ACCU-CHEK GUIDE TEST STRIP: 50 days supply | Qty: 100 | Fill #1

## 2016-06-15 MED FILL — LOSARTAN-HCTZ 100-25 MG TAB: 100-25 | 90 days supply | Qty: 90 | Fill #0

## 2016-06-15 MED FILL — PRAVASTATIN NA 40 MG TAB: 40 | 90 days supply | Qty: 90 | Fill #0

## 2016-06-15 NOTE — Progress Notes (Signed)
All labs are normal. 

## 2016-06-16 ENCOUNTER — Telehealth: Payer: Self-pay | Admitting: Family Medicine

## 2016-06-16 NOTE — Telephone Encounter (Signed)
Received PA on Victoza sent through cover my meds waiting on determination. - CF

## 2016-06-19 MED FILL — VICTOZA 2-PAK 18 MG/3 ML PE: 18 | 30 days supply | Qty: 6 | Fill #0

## 2016-06-19 MED FILL — UNIFINE PENTIPS 32GX5/32": 32G X 4 MM | 90 days supply | Qty: 100 | Fill #0

## 2016-06-19 MED FILL — UNIFINE PENTIPS 32GX5/32: 32G X 4 MM | 90 days supply | Qty: 100 | Fill #0

## 2016-06-23 NOTE — Telephone Encounter (Signed)
victoza has been approved. Left message on [facility] outpatient pharm vm

## 2016-06-25 ENCOUNTER — Ambulatory Visit: Payer: 59 | Admitting: Family Medicine

## 2016-07-08 ENCOUNTER — Encounter: Payer: Self-pay | Admitting: Family Medicine

## 2016-07-08 ENCOUNTER — Telehealth: Payer: 59 | Admitting: Family

## 2016-07-08 DIAGNOSIS — W57XXXA Bitten or stung by nonvenomous insect and other nonvenomous arthropods, initial encounter: Secondary | ICD-10-CM | POA: Diagnosis not present

## 2016-07-08 DIAGNOSIS — S70369A Insect bite (nonvenomous), unspecified thigh, initial encounter: Secondary | ICD-10-CM

## 2016-07-08 NOTE — Progress Notes (Signed)
Thank you for describing your tick bite, Here is how we plan to help! Based on the information that you shared with me it looks like you have An uncomplicated tick bite that just occurred and can be closely follow using the instructions in your care plan.  In most cases a tick bite is painless and does not itch.  Most tick bites in which the tick is quickly removed do not require prescriptions. Ticks can transmit several diseases if they are infected and remain attacked to your skin. Therefore the length that the tick was attached and any symptoms you have experienced after the bite are import to accurately develop your custom treatment plan. In most cases a single dose of doxycycline may prevent the development of a more serious condition.  Based on your information I have Provided a home care guide for tick bites  and  instructions on when to call for help. and Because you are allergic to doxycycline, the center for disease control recommends that we monitor you closely for the next two weeks. No other antibiotic has been approved for prophylaxis.  Which ticks  are associated with illness?  The Wood Tick (dog tick) is the size of a watermelon seed and can sometimes transmit [facility] spotted fever and [place] tick fever.   The Deer Tick (black-legged tick) is between the size of a poppy seed (pin head) and an apple seed, and can sometimes transmit Lyme disease.  A brown to black tick with a white splotch on its back is likely a female Amblyomma americanum ([place] tick). This tick has been associated with [place] Tick Associated illness ( STARI)  Lyme disease has become the most common tick-borne illness in the [place]. The risk of Lyme disease following a recognized deer tick bite is estimated to be 1%.  The majority of cases of Lyme disease start with a bull's eye rash at the site of the tick bite. The rash can occur days to weeks (typically 7-10 days) after a tick bite.  Treatment with antibiotics is indicated if this rash appears. Flu-like symptoms may accompany the rash, including: fever, chills, headaches, muscle aches, and fatigue. Removing ticks promptly may prevent tick borne disease.  What can be used to prevent Tick Bites?   Insect repellant with at leas 20% DEET.  Wearing long pants with sock and shoes.  Avoiding tall grass and heavily wooded areas.  Checking your skin after being outdoors.  Shower with a washcloth after outdoor exposures.  HOME CARE ADVICE FOR TICK BITE  1. Wood Tick Removal:  o Use a pair of tweezers and grasp the wood tick close to the skin (on its head). Pull the wood tick straight upward without twisting or crushing it. Maintain a steady pressure until it releases its grip.   o If tweezers aren't available, use fingers, a loop of thread around the jaws, or a needle between the jaws for traction.  o Note: covering the tick with petroleum jelly, nail polish or rubbing alcohol doesn't work. Neither does touching the tick with a hot or cold object. 2. Tiny Deer Tick Removal:   o Needs to be scraped off with a knife blade or credit card edge. o Place tick in a sealed container (e.g. glass jar, zip lock plastic bag), in case your doctor wants to see it. 3. Tick's Head Removal:  o If the wood tick's head breaks off in the skin, it must be removed. Clean the skin. Then use a  sterile needle to uncover the head and lift it out or scrape it off.  o If a very small piece of the head remains, the skin will eventually slough it off. 4. Antibiotic Ointment:  o Wash the wound and your hands with soap and water after removal to prevent catching any tick disease.  Apply an over the counter antibiotic ointment (e.g. bacitracin) to the bite once. 5. Expected Course: Tick bites normally don't itch or hurt. That's why they often go unnoticed. 6. Call Your Doctor If:  o You can't remove the tick or the tick's head o Fever, a severe head ache,  or rash occur in the next 2 weeks o Bite begins to look infected o Lyme's disease is common in your area o You have not had a tetanus in the last 10 years o Your current symptoms become worse    MAKE SURE YOU   Understand these instructions.  Will watch your condition.  Will get help right away if you are not doing well or get worse.   Thank you for choosing an e-visit.  Your e-visit answers were reviewed by a board certified advanced clinical practitioner to complete your personal care plan. Depending upon the condition, your plan could have included both over the counter or prescription medications. Please review your pharmacy choice. If there is a problem you may use MyChart messaging to have the prescription routed to another pharmacy. Your safety is important to [place]. If you have drug allergies check your prescription carefully.   You can use MyChart to ask questions about today's visit, request a non-urgent call back, or ask for a work or school excuse for 24 hours related to this e-Visit. If it has been greater than 24 hours you will need to follow up with your provider, or enter a new e-Visit to address those concerns.  You will get an email in the next two days asking about your experience. I hope  that your e-visit has been valuable and will speed your recovery

## 2016-07-10 ENCOUNTER — Encounter: Payer: Self-pay | Admitting: Physician Assistant

## 2016-07-10 ENCOUNTER — Ambulatory Visit (INDEPENDENT_AMBULATORY_CARE_PROVIDER_SITE_OTHER): Payer: 59 | Admitting: Physician Assistant

## 2016-07-10 VITALS — BP 152/64 | HR 82 | Temp 98.8°F | Wt 173.0 lb

## 2016-07-10 DIAGNOSIS — W57XXXA Bitten or stung by nonvenomous insect and other nonvenomous arthropods, initial encounter: Secondary | ICD-10-CM | POA: Diagnosis not present

## 2016-07-10 DIAGNOSIS — Z79899 Other long term (current) drug therapy: Secondary | ICD-10-CM | POA: Diagnosis not present

## 2016-07-10 DIAGNOSIS — S30860A Insect bite (nonvenomous) of lower back and pelvis, initial encounter: Secondary | ICD-10-CM

## 2016-07-10 LAB — CBC WITH DIFFERENTIAL/PLATELET
Basophils Absolute: 0 cells/uL (ref 0–200)
Basophils Relative: 0 %
Eosinophils Absolute: 90 cells/uL (ref 15–500)
Eosinophils Relative: 1 %
HCT: 40.7 % (ref 35.0–45.0)
Hemoglobin: 13.4 g/dL (ref 11.7–15.5)
Lymphocytes Relative: 27 %
Lymphs Abs: 2430 cells/uL (ref 850–3900)
MCH: 29.6 pg (ref 27.0–33.0)
MCHC: 32.9 g/dL (ref 32.0–36.0)
MCV: 89.8 fL (ref 80.0–100.0)
MPV: 9.5 fL (ref 7.5–12.5)
Monocytes Absolute: 540 cells/uL (ref 200–950)
Monocytes Relative: 6 %
Neutro Abs: 5940 cells/uL (ref 1500–7800)
Neutrophils Relative %: 66 %
Platelets: 280 10*3/uL (ref 140–400)
RBC: 4.53 MIL/uL (ref 3.80–5.10)
RDW: 13.1 % (ref 11.0–15.0)
WBC: 9 10*3/uL (ref 3.8–10.8)

## 2016-07-10 MED ORDER — DOXYCYCLINE HYCLATE 100 MG PO TABS: ORAL_TABLET | ORAL | 0 refills | Status: DC

## 2016-07-10 NOTE — Patient Instructions (Signed)
Lyme Disease Lyme disease is an infection that affects many parts of the body, including the skin, joints, and nervous system. It is a bacterial infection that starts from the bite of an infected tick. The infection can spread, and some of the symptoms are similar to the flu. If Lyme disease is not treated, it may cause joint pain, swelling, numbness, problems thinking, fatigue, muscle weakness, and other problems. What are the causes? This condition is caused by bacteria called Borrelia burgdorferi. You can get Lyme disease by being bitten by an infected tick. The tick must be attached to your skin to pass along the infection. Deer often carry infected ticks. What increases the risk? The following factors may make you more likely to develop this condition:  Living in or visiting these areas in the [place]  [place].  The [place].  The [place].  Spending time in wooded or grassy areas.  Being outdoors with exposed skin.  Camping, gardening, hiking, fishing, or hunting outdoors.  Failing to remove a tick from your skin within 3-4 days. What are the signs or symptoms? Symptoms of this condition include:  A round, red rash that surrounds the center of the tick bite. This is the first sign of infection. The center of the rash may be blood colored or have tiny blisters.  Fatigue.  Headache.  Chills and fever.  General achiness.  Joint pain, often in the knees.  Muscle pain.  Swollen lymph glands.  Stiff neck. How is this diagnosed? This condition is diagnosed based on:  Your symptoms and medical history.  A physical exam.  A blood test. How is this treated? The main treatment for this condition is antibiotic medicine, which is usually taken by mouth (orally). The length of treatment depends on how soon after a tick bite you begin taking the medicine. In some cases, treatment is necessary for several weeks. If the infection is severe, antibiotics may  need to be given through an IV tube that is inserted into one of your veins. Follow these instructions at home:  Take your antibiotic medicine as told by your health care provider. Do not stop taking the antibiotic even if you start to feel better.  Ask your health care provider about takinga probiotic in between doses of your antibiotic to help avoid stomach upset or diarrhea.  Check with your health care provider before supplementing your treatment. Many alternative therapies have not been proven and may be harmful to you.  Keep all follow-up visits as told by your health care provider. This is important. How is this prevented? You can become reinfected if you get another tick bite from an infected tick. Take these steps to help prevent an infection:  Cover your skin with light-colored clothing when you are outdoors in the spring and summer months.  Spray clothing and skin with bug spray. The spray should be 20-30% DEET.  Avoid wooded, grassy, and shaded areas.  Remove yard litter, brush, trash, and plants that attract deer and rodents.  Check yourself for ticks when you come indoors.  Wash clothing worn each day.  Check your pets for ticks before they come inside.  If you find a tick:  Remove it with tweezers.  Clean your hands and the bite area with rubbing alcohol or soap and water. Pregnant women should take special care to avoid tick bites because the infection can be passed along to the fetus. Contact a health care provider if:  You have symptoms after   treatment.  You have removed a tick and want to bring it to your health care provider for testing. Get help right away if:  You have an irregular heartbeat.  You have nerve pain.  Your face feels numb. This information is not intended to replace advice given to you by your health care provider. Make sure you discuss any questions you have with your health care provider. Document Released: 04/20/2000 Document  Revised: 09/03/2015 Document Reviewed: 09/03/2015 Elsevier Interactive Patient Education  2017 [employer] Inc.  

## 2016-07-10 NOTE — Progress Notes (Signed)
   Subjective:    Patient ID: Maria Nunez, female    DOB: May 31, 1960, 56 y.o.   MRN: [id number]  HPI  Pt is a 56 yo female who presents to the clinic after she removed tick on Wednesday in the shower. She takes shower everyday so does not think tick latched more than 36 hours. She does confirm it was engorged. She denies any travel. She has been working in yard. She is concerned because she has felt bad and had nausea and diarrhea for 2 days. She has had some mild abdominal cramping. She denies any URI symptoms. She has not tried anything to make her better. She denies any rash but it was on her right buttocks and cannot see the area well.   .. Active Ambulatory Problems    Diagnosis Date Noted  . Diabetes mellitus without complication ([place]) 95/28/4132  . HYPERCHOLESTEROLEMIA 11/03/2005  . PANIC ATTACK 09/07/2008  . RESTLESS LEG SYNDROME 04/02/2008  . HYPERTENSION, BENIGN SYSTEMIC 11/03/2005  . HOT FLASHES 08/04/2006  . KNEE PAIN, LEFT 01/02/2008  . POLYARTHRITIS 04/02/2008  . LGSIL (low grade squamous intraepithelial dysplasia) 06/16/2012  . Carpal tunnel syndrome, bilateral 04/05/2013  . Localized primary osteoarthritis of carpometacarpal joint of right thumb 03/15/2015  . GAD (generalized anxiety disorder) 04/02/2015  . Bursitis of left shoulder 01/24/2016   Resolved Ambulatory Problems    Diagnosis Date Noted  . HYPERTENSION 04/27/2008  . Plantar fasciosis 11/26/2011  . Tennis elbow 03/24/2013  . Left lateral epicondylitis 04/05/2013  . Skin abscess 06/22/2013  . Skin lesion of left arm 09/11/2013   Past Medical History:  Diagnosis Date  . Abnormal ultrasound of ovary 2016  . Allergy   . Diabetes mellitus   . Fibroid   . Heart murmur   . Hidradenitis   . Hypertension        Review of Systems See HPI>     Objective:   Physical Exam  Constitutional: She is oriented to person, place, and time. She appears well-developed and well-nourished.  HENT:  Head:  Normocephalic and atraumatic.  Cardiovascular: Normal rate, regular rhythm and normal heart sounds.   Pulmonary/Chest: Effort normal and breath sounds normal.  Abdominal: Soft. Bowel sounds are normal. She exhibits no distension and no mass. There is no tenderness. There is no rebound and no guarding.  Neurological: She is alert and oriented to person, place, and time.  Skin:  Right buttock small nodule likely where tick attached. No surrounding rash, erythema, tenderness, induration.   Psychiatric: She has a normal mood and affect. Her behavior is normal.          Assessment & Plan:  Marland KitchenMarland KitchenKalene was seen today for tick removal.  Diagnoses and all orders for this visit:  Tick bite, initial encounter -     CBC with Differential/Platelet -     doxycycline (VIBRA-TABS) 100 MG tablet; Take 2 tablets as a single dose.   Reassurance given that I do not think this is lymes disease that patient is worried about. Discussed we do not live in an area that this is prominent. Since tick was engorged and she does have symptoms and it has been under 72 hours since tick was attached will give single dose prophylaxis. CBC ordered. Quite possible there could be some viral gastroenteritis going on as well. Keep BRAT diet and stay hydrated. Follow up if symptoms worsen or persist.

## 2016-07-13 ENCOUNTER — Ambulatory Visit
Admission: RE | Admit: 2016-07-13 | Discharge: 2016-07-13 | Disposition: A | Discharge status: Home / Self Care | Payer: 59 | Source: Ambulatory Visit | Attending: Family Medicine | Admitting: Family Medicine

## 2016-07-13 DIAGNOSIS — Z1231 Encounter for screening mammogram for malignant neoplasm of breast: Secondary | ICD-10-CM | POA: Diagnosis not present

## 2016-07-22 DIAGNOSIS — M79644 Pain in right finger(s): Secondary | ICD-10-CM | POA: Diagnosis not present

## 2016-07-22 DIAGNOSIS — G5601 Carpal tunnel syndrome, right upper limb: Secondary | ICD-10-CM | POA: Diagnosis not present

## 2016-07-23 ENCOUNTER — Encounter: Payer: Self-pay | Admitting: Family Medicine

## 2016-07-24 ENCOUNTER — Ambulatory Visit: Payer: 59 | Admitting: Family Medicine

## 2016-07-27 DIAGNOSIS — G5601 Carpal tunnel syndrome, right upper limb: Secondary | ICD-10-CM | POA: Diagnosis not present

## 2016-08-03 ENCOUNTER — Ambulatory Visit (INDEPENDENT_AMBULATORY_CARE_PROVIDER_SITE_OTHER): Payer: 59 | Admitting: Family Medicine

## 2016-08-03 ENCOUNTER — Encounter: Payer: Self-pay | Admitting: Family Medicine

## 2016-08-03 VITALS — BP 139/64 | HR 87 | Wt 171.0 lb

## 2016-08-03 DIAGNOSIS — T887XXA Unspecified adverse effect of drug or medicament, initial encounter: Secondary | ICD-10-CM | POA: Diagnosis not present

## 2016-08-03 DIAGNOSIS — E119 Type 2 diabetes mellitus without complications: Secondary | ICD-10-CM | POA: Diagnosis not present

## 2016-08-03 LAB — POCT GLYCOSYLATED HEMOGLOBIN (HGB A1C): Hemoglobin A1C: 7.4

## 2016-08-03 NOTE — Progress Notes (Signed)
Subjective:    CC: DM  HPI: Diabetes - no hypoglycemic events. No wounds or sores that are not healing well. No increased thirst or urination. Checking glucose at home. Taking medications as prescribed without any side effects.We actually recently had changed her medication because she had an allergic reaction to her medication. We decided to start [place] in May. She is still on metformin. She has had some nausea. She says the first week that she started she had some lower abdominal cramping. That actually did resolve after about a week but she has still had a little bit of nausea particularly after she eats. She says that she is okay with that and not ready to stop the medication. She has not had any vomiting.  She did want to let me know that she did see Dr. Phillip Heal in for her right carpal tunnel. It is severe enough that they are actually planning surgery. She said she will likely do it in December still be easier to take vacation time.   Objective:    General: Well Developed, well nourished, and in no acute distress.  Neuro: Alert and oriented x3, extra-ocular muscles intact, sensation grossly intact.  HEENT: Normocephalic, atraumatic  Skin: Warm and dry, no rashes. Cardiac: Regular rate and rhythm, no murmurs rubs or gallops, no lower extremity edema.  Respiratory: Clear to auscultation bilaterally. Not using accessory muscles, speaking in full sentences.   Impression and Recommendations:    DM- Much improved. Last hemoglobin A1c was 10.4. Is now down to 7.4 which is absolutely fantastic. I explained to her that happened that is from the medication but the other half is from her getting back on track with her diet and exercise. She's been trying to get [place] every day even if she just doesn't from the TV in the evenings. Follow-up in 2 months. Lab Results  Component Value Date   HGBA1C 7.4 08/03/2016   Medication side effect-she is getting some nausea from the [place] but is  willing to continue it at this point. Her that often it does seem to get a little bit better. She does want to stay at the 0.6 mg dose so we will keep her there.

## 2016-08-10 MED FILL — ACCU-CHEK GUIDE TEST STRIP: 50 days supply | Qty: 100 | Fill #2

## 2016-08-24 ENCOUNTER — Encounter: Payer: Self-pay | Admitting: Family Medicine

## 2016-09-16 ENCOUNTER — Ambulatory Visit: Payer: 59 | Admitting: Family Medicine

## 2016-09-17 MED FILL — metFORMIN HCL 1000 MG TABS: 1000 | 90 days supply | Qty: 180 | Fill #1

## 2016-09-17 MED FILL — LOSARTAN-HCTZ 100-25 MG TAB: 100-25 | 90 days supply | Qty: 90 | Fill #1

## 2016-09-18 MED FILL — PRAVASTATIN NA 40 MG TAB: 40 | 90 days supply | Qty: 90 | Fill #1

## 2016-09-22 ENCOUNTER — Encounter: Payer: Self-pay | Admitting: Family Medicine

## 2016-09-22 ENCOUNTER — Ambulatory Visit (INDEPENDENT_AMBULATORY_CARE_PROVIDER_SITE_OTHER): Payer: 59 | Admitting: Family Medicine

## 2016-09-22 VITALS — BP 137/70 | HR 77 | Wt 163.0 lb

## 2016-09-22 DIAGNOSIS — E119 Type 2 diabetes mellitus without complications: Secondary | ICD-10-CM

## 2016-09-22 DIAGNOSIS — A084 Viral intestinal infection, unspecified: Secondary | ICD-10-CM | POA: Diagnosis not present

## 2016-09-22 DIAGNOSIS — T887XXA Unspecified adverse effect of drug or medicament, initial encounter: Secondary | ICD-10-CM | POA: Diagnosis not present

## 2016-09-22 NOTE — Progress Notes (Signed)
Subjective:     Patient ID: Maria Nunez, female   DOB: 1960/04/12, 56 y.o.   MRN: [id number]  HPI Diabetes - no hypoglycemic events. No wounds or sores that are not healing well. No increased thirst or urination. Checking glucose at home. She is now on metformin. She had actually sent to my chart message about 4 weeks ago in the end of July stating that she was expressing some back pain and had read on the side effect profile that this could be from the Victoza. She stopped it temporarily and her pain decreased but did not completely go away.she says the back pain was quite severe. She couldn't even sit still with it. She finally talked to one of the nurses at work and they told her that it could be an early sign of pancreatitis and to stop the medication. Coming off the medication she's felt significantly better.she did bring in her home glucose log.at this point her back pain is completely resolved.  She also c/o f diarrhea for about 4 days. She didn't have any bad cramping with it but just didn't feel great. She felt nauseated but never vomited. She had a headache with it. She denies any fever. She says today she actually feels a lot better. She was wondering if there may be a virus going around. She doesn't think she ate anything bad to trigger it.  Review of Systems  BP 137/70   Pulse 77   Wt 163 lb (73.9 kg)   LMP 01/27/2000 (Approximate)   SpO2 96%   BMI 31.83 kg/m     Allergies  Allergen Reactions  . Cefaclor   . Cephalexin   . Hycodan [Hydrocodone-Homatropine] Itching  . Januvia [Sitagliptin] Nausea Only    Back pain  . Penicillins   . Sulfonamide Derivatives     REACTION: hives  . Tetracycline   . Victoza [Liraglutide] Other (See Comments)    Back pain  . Xigduo Xr [Dapagliflozin-Metformin Hcl Er] Hives    Past Medical History:  Diagnosis Date  . Abnormal ultrasound of ovary 2016   Ultrasound showing bilateral ovaries in contact with one another.  Asymptomatic.   [name] Allergy   . Diabetes mellitus    borderline  . Fibroid   . Heart murmur   . Hidradenitis   . Hypertension     Past Surgical History:  Procedure Laterality Date  . ABDOMINAL HYSTERECTOMY     supracervical  . BREAST EXCISIONAL BIOPSY    . BREAST SURGERY     cysts removed right breast  . CESAREAN SECTION    . INGUINAL HIDRADENITIS EXCISION    . LEEP  05/04/13   HPV change    Social History   Social History  . Marital status: Divorced    Spouse name: N/A  . Number of children: N/A  . Years of education: N/A   Occupational History  . Not on file.   Social History Main Topics  . Smoking status: Never Smoker  . Smokeless tobacco: Never Used  . Alcohol use No  . Drug use: No  . Sexual activity: No     Comment: TAH/supracervical   Other Topics Concern  . Not on file   Social History Narrative  . No narrative on file    Family History  Problem Relation Age of Onset  . Asthma Mother   . COPD Mother   . Diabetes Mother   . Hypertension Mother   . Diabetes Father   . Hypertension  Father   . Breast cancer Maternal Aunt   . Breast cancer Paternal Aunt     Outpatient Encounter Prescriptions as of 09/22/2016  Medication Sig  . aspirin 81 MG tablet Take 81 mg by mouth daily.    . Blood Glucose Monitoring Suppl (FREESTYLE LITE) DEVI   . Cyanocobalamin (B-12) 500 MCG TABS Take 1 tablet by mouth daily.  [name] FOLIC ACID PO Take 578 mcg by mouth daily.   [name] glucose blood test strip accu chek strips. For testing twice daily. DX: E11.9  . Lancet Devices (ACCU-CHEK SOFTCLIX) lancets Use as instructed  . losartan-hydrochlorothiazide (HYZAAR) 100-25 MG tablet TAKE 1 TABLET BY MOUTH DAILY.  . metFORMIN (GLUCOPHAGE) 1000 MG tablet Take 1 tablet (1,000 mg total) by mouth 2 (two) times daily with a meal.  . Multiple Vitamin (MULTIVITAMIN) tablet Take 1 tablet by mouth daily.    . Omega-3 Fatty Acids (FISH OIL) 1000 MG CAPS Take 1,000 mg by mouth daily.   . pravastatin (PRAVACHOL)  40 MG tablet TAKE 1 TABLET BY MOUTH ONCE DAILY  . pyridOXINE (VITAMIN B-6) 100 MG tablet Take 100 mg by mouth daily.  . Zinc Sulfate (ZINC 15 PO) Take by mouth.    . [DISCONTINUED] fluticasone (FLONASE) 50 MCG/ACT nasal spray Place 2 sprays into both nostrils daily.  . [DISCONTINUED] Insulin Pen Needle 32G X 4 MM MISC Use prn with Victoza pen  . [DISCONTINUED] Lancets (FREESTYLE) lancets   . [DISCONTINUED] liraglutide (VICTOZA) 18 MG/3ML SOPN Inject 0.1 mLs (0.6 mg total) into the skin daily. After one week increase to 1.2 mg [place] QD   No facility-administered encounter medications on file as of 09/22/2016.          Objective:   Physical Exam  Constitutional: She is oriented to person, place, and time. She appears well-developed and well-nourished.  HENT:  Head: Normocephalic and atraumatic.  Cardiovascular: Normal rate, regular rhythm and normal heart sounds.   Pulmonary/Chest: Effort normal and breath sounds normal.  Abdominal: Soft. Bowel sounds are normal. She exhibits no distension and no mass. There is no tenderness. There is no rebound and no guarding.  Neurological: She is alert and oriented to person, place, and time.  Skin: Skin is warm and dry.  Psychiatric: She has a normal mood and affect. Her behavior is normal.       Assessment:    DM  Diarrheal illness  Medication side effect -     Plan:    DM - home blood sugars look fantastic. So think for now we'll just continue to monitor them with just the metformin. She can call me in a few weeks if she is noticing it increase in her blood sugars. We can always consider another agent similar to sick ptosis such as Trulicity for try a completely different medication.  Medication side effect-added Victoza to intolerance list. Per up-to-date 4-5% reported back pain.  Diarrheal illness-seems to be short lived likely viral. Call symptoms recur.

## 2016-10-05 ENCOUNTER — Ambulatory Visit: Payer: 59 | Admitting: Family Medicine

## 2016-10-16 DIAGNOSIS — M79644 Pain in right finger(s): Secondary | ICD-10-CM | POA: Diagnosis not present

## 2016-10-16 DIAGNOSIS — G5601 Carpal tunnel syndrome, right upper limb: Secondary | ICD-10-CM | POA: Diagnosis not present

## 2016-10-16 DIAGNOSIS — M79641 Pain in right hand: Secondary | ICD-10-CM | POA: Diagnosis not present

## 2016-10-16 DIAGNOSIS — M1811 Unilateral primary osteoarthritis of first carpometacarpal joint, right hand: Secondary | ICD-10-CM | POA: Diagnosis not present

## 2016-10-19 ENCOUNTER — Encounter: Payer: Self-pay | Admitting: Family Medicine

## 2016-10-19 MED ORDER — ESCITALOPRAM OXALATE 10 MG PO TABS: 10.0000 mg | ORAL_TABLET | Freq: Every day | ORAL | 3 refills | Status: DC

## 2016-10-19 MED FILL — ESCITALOPRAM 10 MG TABLET: 10 | 30 days supply | Qty: 30 | Fill #0

## 2016-10-29 ENCOUNTER — Ambulatory Visit (INDEPENDENT_AMBULATORY_CARE_PROVIDER_SITE_OTHER): Payer: 59

## 2016-10-29 ENCOUNTER — Encounter: Payer: Self-pay | Admitting: Family Medicine

## 2016-10-29 ENCOUNTER — Ambulatory Visit (INDEPENDENT_AMBULATORY_CARE_PROVIDER_SITE_OTHER): Payer: 59 | Admitting: Family Medicine

## 2016-10-29 VITALS — BP 129/63 | HR 81 | Ht 60.0 in | Wt 167.0 lb

## 2016-10-29 DIAGNOSIS — E119 Type 2 diabetes mellitus without complications: Secondary | ICD-10-CM | POA: Diagnosis not present

## 2016-10-29 DIAGNOSIS — I10 Essential (primary) hypertension: Secondary | ICD-10-CM | POA: Diagnosis not present

## 2016-10-29 DIAGNOSIS — M25552 Pain in left hip: Secondary | ICD-10-CM

## 2016-10-29 DIAGNOSIS — R1030 Lower abdominal pain, unspecified: Secondary | ICD-10-CM | POA: Diagnosis not present

## 2016-10-29 LAB — POCT GLYCOSYLATED HEMOGLOBIN (HGB A1C): Hemoglobin A1C: 6.9

## 2016-10-29 NOTE — Progress Notes (Signed)
Subjective:    CC: DM  HPI:  Diabetes - no hypoglycemic events. No wounds or sores that are not healing well. No increased thirst or urination. Checking glucose at home. Taking medications as prescribed without any side effects.  She's also complaining of some left hip pain it's actually been keeping her from walking regularly. It started back in August. She was exercising and going up and down a lot of stairs around that time. No specific trauma or injury though. She describes it as a dull ache and sometimes almost a burning sensation. It's worse with walking and also sometimes worse with just sitting for prolonged periods it's not painful at night when she's lying on her side. It's really over the anterior crease on the left side she's been trying to do some stretches on her own and use some ibuprofen.  Hypertension- Pt denies chest pain, SOB, dizziness, or heart palpitations.  Taking meds as directed w/o problems.  Denies medication side effects.      Past medical history, Surgical history, Family history not pertinant except as noted below, Social history, Allergies, and medications have been entered into the medical record, reviewed, and corrections made.   Review of Systems: No fevers, chills, night sweats, weight loss, chest pain, or shortness of breath.   Objective:    General: Well Developed, well nourished, and in no acute distress.  Neuro: Alert and oriented x3, extra-ocular muscles intact, sensation grossly intact.  HEENT: Normocephalic, atraumatic  Skin: Warm and dry, no rashes. Cardiac: Regular rate and rhythm, no murmurs rubs or gallops, no lower extremity edema.  Respiratory: Clear to auscultation bilaterally. Not using accessory muscles, speaking in full sentences. MSK:    Impression and Recommendations:    DM- 1C is back down into the normal range at 6.9 today. Great work. Continue with current regimen which she is tolerating well.  Left hip pain-most consistent  with left hip flexor strain. She would like to get an x-ray for further evaluations order placed. Given a handout for stretches to do on her own at home. It's improving them please let me know. We can also get her resistance band if needed. And then gradually ease herself back and her regular exercise routine.  HTN - Well controlled. Continue current regimen. Follow up in  4 months.

## 2016-11-16 ENCOUNTER — Ambulatory Visit: Payer: Self-pay | Admitting: Orthopedic Surgery

## 2016-12-02 MED FILL — ACCU-CHEK GUIDE TEST STRIP: 50 days supply | Qty: 100 | Fill #3

## 2016-12-04 ENCOUNTER — Ambulatory Visit: Payer: 59 | Admitting: Obstetrics and Gynecology

## 2016-12-11 MED FILL — LOSARTAN-HCTZ 100-25 MG TAB: 100-25 | 90 days supply | Qty: 90 | Fill #2

## 2016-12-11 MED FILL — PRAVASTATIN NA 40 MG TAB: 40 | 90 days supply | Qty: 90 | Fill #2

## 2016-12-11 MED FILL — metFORMIN HCL 1000 MG TABS: 1000 | 90 days supply | Qty: 180 | Fill #2

## 2016-12-23 ENCOUNTER — Ambulatory Visit: Payer: 59 | Admitting: Family Medicine

## 2017-01-05 ENCOUNTER — Encounter (HOSPITAL_BASED_OUTPATIENT_CLINIC_OR_DEPARTMENT_OTHER): Payer: Self-pay | Admitting: *Deleted

## 2017-01-05 ENCOUNTER — Other Ambulatory Visit: Payer: Self-pay

## 2017-01-06 ENCOUNTER — Encounter (HOSPITAL_BASED_OUTPATIENT_CLINIC_OR_DEPARTMENT_OTHER)
Admission: RE | Admit: 2017-01-06 | Discharge: 2017-01-06 | Disposition: A | Discharge status: Home / Self Care | Payer: 59 | Source: Ambulatory Visit | Attending: Orthopedic Surgery | Admitting: Orthopedic Surgery

## 2017-01-06 DIAGNOSIS — I10 Essential (primary) hypertension: Secondary | ICD-10-CM | POA: Diagnosis not present

## 2017-01-06 DIAGNOSIS — Z833 Family history of diabetes mellitus: Secondary | ICD-10-CM | POA: Diagnosis not present

## 2017-01-06 DIAGNOSIS — Z882 Allergy status to sulfonamides status: Secondary | ICD-10-CM | POA: Diagnosis not present

## 2017-01-06 DIAGNOSIS — Z885 Allergy status to narcotic agent status: Secondary | ICD-10-CM | POA: Diagnosis not present

## 2017-01-06 DIAGNOSIS — Z803 Family history of malignant neoplasm of breast: Secondary | ICD-10-CM | POA: Diagnosis not present

## 2017-01-06 DIAGNOSIS — Z79899 Other long term (current) drug therapy: Secondary | ICD-10-CM | POA: Diagnosis not present

## 2017-01-06 DIAGNOSIS — Z7982 Long term (current) use of aspirin: Secondary | ICD-10-CM | POA: Diagnosis not present

## 2017-01-06 DIAGNOSIS — Z7984 Long term (current) use of oral hypoglycemic drugs: Secondary | ICD-10-CM | POA: Diagnosis not present

## 2017-01-06 DIAGNOSIS — Z825 Family history of asthma and other chronic lower respiratory diseases: Secondary | ICD-10-CM | POA: Diagnosis not present

## 2017-01-06 DIAGNOSIS — Z8249 Family history of ischemic heart disease and other diseases of the circulatory system: Secondary | ICD-10-CM | POA: Diagnosis not present

## 2017-01-06 DIAGNOSIS — Z888 Allergy status to other drugs, medicaments and biological substances status: Secondary | ICD-10-CM | POA: Diagnosis not present

## 2017-01-06 DIAGNOSIS — E119 Type 2 diabetes mellitus without complications: Secondary | ICD-10-CM | POA: Diagnosis not present

## 2017-01-06 DIAGNOSIS — Z01810 Encounter for preprocedural cardiovascular examination: Secondary | ICD-10-CM | POA: Diagnosis not present

## 2017-01-06 DIAGNOSIS — Z01812 Encounter for preprocedural laboratory examination: Secondary | ICD-10-CM | POA: Diagnosis not present

## 2017-01-06 DIAGNOSIS — Z881 Allergy status to other antibiotic agents status: Secondary | ICD-10-CM | POA: Diagnosis not present

## 2017-01-06 DIAGNOSIS — G5601 Carpal tunnel syndrome, right upper limb: Secondary | ICD-10-CM | POA: Diagnosis not present

## 2017-01-06 NOTE — Progress Notes (Signed)
EKG reviewed by Dr. Carignan, will proceed with surgery as scheduled. 

## 2017-01-08 ENCOUNTER — Ambulatory Visit (HOSPITAL_BASED_OUTPATIENT_CLINIC_OR_DEPARTMENT_OTHER): Payer: 59 | Admitting: Certified Registered"

## 2017-01-08 ENCOUNTER — Encounter (HOSPITAL_BASED_OUTPATIENT_CLINIC_OR_DEPARTMENT_OTHER)
Admission: RE | Disposition: A | Discharge status: Home / Self Care | Payer: Self-pay | Source: Ambulatory Visit | Attending: Orthopedic Surgery

## 2017-01-08 ENCOUNTER — Other Ambulatory Visit: Payer: Self-pay

## 2017-01-08 ENCOUNTER — Encounter (HOSPITAL_BASED_OUTPATIENT_CLINIC_OR_DEPARTMENT_OTHER): Payer: Self-pay | Admitting: *Deleted

## 2017-01-08 ENCOUNTER — Ambulatory Visit (HOSPITAL_BASED_OUTPATIENT_CLINIC_OR_DEPARTMENT_OTHER)
Admission: RE | Admit: 2017-01-08 | Discharge: 2017-01-08 | Disposition: A | Discharge status: Home / Self Care | Payer: 59 | Source: Ambulatory Visit | Attending: Orthopedic Surgery | Admitting: Orthopedic Surgery

## 2017-01-08 DIAGNOSIS — M1811 Unilateral primary osteoarthritis of first carpometacarpal joint, right hand: Secondary | ICD-10-CM | POA: Diagnosis not present

## 2017-01-08 DIAGNOSIS — G5601 Carpal tunnel syndrome, right upper limb: Secondary | ICD-10-CM | POA: Diagnosis not present

## 2017-01-08 DIAGNOSIS — Z01810 Encounter for preprocedural cardiovascular examination: Secondary | ICD-10-CM | POA: Insufficient documentation

## 2017-01-08 DIAGNOSIS — E119 Type 2 diabetes mellitus without complications: Secondary | ICD-10-CM | POA: Insufficient documentation

## 2017-01-08 DIAGNOSIS — Z888 Allergy status to other drugs, medicaments and biological substances status: Secondary | ICD-10-CM | POA: Insufficient documentation

## 2017-01-08 DIAGNOSIS — Z803 Family history of malignant neoplasm of breast: Secondary | ICD-10-CM | POA: Insufficient documentation

## 2017-01-08 DIAGNOSIS — I10 Essential (primary) hypertension: Secondary | ICD-10-CM | POA: Insufficient documentation

## 2017-01-08 DIAGNOSIS — Z79899 Other long term (current) drug therapy: Secondary | ICD-10-CM | POA: Diagnosis not present

## 2017-01-08 DIAGNOSIS — Z7982 Long term (current) use of aspirin: Secondary | ICD-10-CM | POA: Diagnosis not present

## 2017-01-08 DIAGNOSIS — Z8249 Family history of ischemic heart disease and other diseases of the circulatory system: Secondary | ICD-10-CM | POA: Insufficient documentation

## 2017-01-08 DIAGNOSIS — Z7984 Long term (current) use of oral hypoglycemic drugs: Secondary | ICD-10-CM | POA: Diagnosis not present

## 2017-01-08 DIAGNOSIS — Z833 Family history of diabetes mellitus: Secondary | ICD-10-CM | POA: Insufficient documentation

## 2017-01-08 DIAGNOSIS — Z01812 Encounter for preprocedural laboratory examination: Secondary | ICD-10-CM | POA: Diagnosis not present

## 2017-01-08 DIAGNOSIS — Z885 Allergy status to narcotic agent status: Secondary | ICD-10-CM | POA: Insufficient documentation

## 2017-01-08 DIAGNOSIS — Z882 Allergy status to sulfonamides status: Secondary | ICD-10-CM | POA: Insufficient documentation

## 2017-01-08 DIAGNOSIS — G5603 Carpal tunnel syndrome, bilateral upper limbs: Secondary | ICD-10-CM | POA: Diagnosis not present

## 2017-01-08 DIAGNOSIS — Z881 Allergy status to other antibiotic agents status: Secondary | ICD-10-CM | POA: Insufficient documentation

## 2017-01-08 DIAGNOSIS — Z825 Family history of asthma and other chronic lower respiratory diseases: Secondary | ICD-10-CM | POA: Insufficient documentation

## 2017-01-08 HISTORY — PX: CARPAL TUNNEL RELEASE: SHX101

## 2017-01-08 LAB — POCT I-STAT, CHEM 8
BUN: 13 mg/dL (ref 6–20)
Calcium, Ion: 1.02 mmol/L — ABNORMAL LOW (ref 1.15–1.40)
Chloride: 100 mmol/L — ABNORMAL LOW (ref 101–111)
Creatinine, Ser: 0.5 mg/dL (ref 0.44–1.00)
Glucose, Bld: 133 mg/dL — ABNORMAL HIGH (ref 65–99)
HCT: 43 % (ref 36.0–46.0)
Hemoglobin: 14.6 g/dL (ref 12.0–15.0)
Potassium: 3.4 mmol/L — ABNORMAL LOW (ref 3.5–5.1)
Sodium: 139 mmol/L (ref 135–145)
TCO2: 24 mmol/L (ref 22–32)

## 2017-01-08 LAB — GLUCOSE, CAPILLARY: Glucose-Capillary: 142 mg/dL — ABNORMAL HIGH (ref 65–99)

## 2017-01-08 SURGERY — CARPAL TUNNEL RELEASE
Anesthesia: Monitor Anesthesia Care | Site: Wrist | Laterality: Right

## 2017-01-08 MED ORDER — DEXAMETHASONE SODIUM PHOSPHATE 10 MG/ML IJ SOLN
INTRAMUSCULAR | Status: AC
  Filled 2017-01-08: qty 3

## 2017-01-08 MED ORDER — CHLORHEXIDINE GLUCONATE 4 % EX LIQD: 60.0000 mL | Freq: Once | CUTANEOUS | Status: DC

## 2017-01-08 MED ORDER — FENTANYL CITRATE (PF) 100 MCG/2ML IJ SOLN: 25.0000 ug | INTRAMUSCULAR | Status: DC | PRN

## 2017-01-08 MED ORDER — ONDANSETRON HCL 4 MG/2ML IJ SOLN
INTRAMUSCULAR | Status: DC | PRN
  Administered 2017-01-08: 4 mg via INTRAVENOUS

## 2017-01-08 MED ORDER — PROMETHAZINE HCL 25 MG/ML IJ SOLN: 6.2500 mg | INTRAMUSCULAR | Status: DC | PRN

## 2017-01-08 MED ORDER — PHENYLEPHRINE 40 MCG/ML (10ML) SYRINGE FOR IV PUSH (FOR BLOOD PRESSURE SUPPORT)
PREFILLED_SYRINGE | INTRAVENOUS | Status: AC
  Filled 2017-01-08: qty 30

## 2017-01-08 MED ORDER — BUPIVACAINE HCL (PF) 0.25 % IJ SOLN
INTRAMUSCULAR | Status: AC
  Filled 2017-01-08: qty 30

## 2017-01-08 MED ORDER — LIDOCAINE 2% (20 MG/ML) 5 ML SYRINGE
INTRAMUSCULAR | Status: AC
  Filled 2017-01-08: qty 15

## 2017-01-08 MED ORDER — FENTANYL CITRATE (PF) 100 MCG/2ML IJ SOLN
INTRAMUSCULAR | Status: AC
  Filled 2017-01-08: qty 2

## 2017-01-08 MED ORDER — SODIUM BICARBONATE 4 % IV SOLN
INTRAVENOUS | Status: DC | PRN
  Administered 2017-01-08: 18 mL via INTRAMUSCULAR

## 2017-01-08 MED ORDER — SCOPOLAMINE 1 MG/3DAYS TD PT72: 1.0000 | MEDICATED_PATCH | Freq: Once | TRANSDERMAL | Status: DC | PRN

## 2017-01-08 MED ORDER — EPHEDRINE 5 MG/ML INJ
INTRAVENOUS | Status: AC
  Filled 2017-01-08: qty 10

## 2017-01-08 MED ORDER — FENTANYL CITRATE (PF) 100 MCG/2ML IJ SOLN
50.0000 ug | INTRAMUSCULAR | Status: DC | PRN
  Administered 2017-01-08: 50 ug via INTRAVENOUS

## 2017-01-08 MED ORDER — SODIUM BICARBONATE 4 % IV SOLN
INTRAVENOUS | Status: AC
  Filled 2017-01-08: qty 5

## 2017-01-08 MED ORDER — MIDAZOLAM HCL 2 MG/2ML IJ SOLN
INTRAMUSCULAR | Status: AC
  Filled 2017-01-08: qty 2

## 2017-01-08 MED ORDER — VANCOMYCIN HCL IN DEXTROSE 1-5 GM/200ML-% IV SOLN
1000.0000 mg | INTRAVENOUS | Status: AC
  Administered 2017-01-08: 1000 mg via INTRAVENOUS

## 2017-01-08 MED ORDER — ONDANSETRON HCL 4 MG/2ML IJ SOLN
INTRAMUSCULAR | Status: AC
  Filled 2017-01-08: qty 8

## 2017-01-08 MED ORDER — PROPOFOL 500 MG/50ML IV EMUL
INTRAVENOUS | Status: AC
  Filled 2017-01-08: qty 50

## 2017-01-08 MED ORDER — LIDOCAINE HCL (PF) 1 % IJ SOLN
INTRAMUSCULAR | Status: AC
  Filled 2017-01-08: qty 5

## 2017-01-08 MED ORDER — MIDAZOLAM HCL 2 MG/2ML IJ SOLN
1.0000 mg | INTRAMUSCULAR | Status: DC | PRN
  Administered 2017-01-08: 1 mg via INTRAVENOUS

## 2017-01-08 MED ORDER — LACTATED RINGERS IV SOLN
INTRAVENOUS | Status: DC
  Administered 2017-01-08: 07:00:00 via INTRAVENOUS

## 2017-01-08 MED ORDER — OXYCODONE-ACETAMINOPHEN 5-325 MG PO TABS: 2.0000 | ORAL_TABLET | ORAL | 0 refills | Status: DC | PRN

## 2017-01-08 MED ORDER — VANCOMYCIN HCL IN DEXTROSE 1-5 GM/200ML-% IV SOLN
INTRAVENOUS | Status: AC
  Filled 2017-01-08: qty 200

## 2017-01-08 SURGICAL SUPPLY — 46 items
BANDAGE ACE 3X5.8 VEL STRL LF (GAUZE/BANDAGES/DRESSINGS) ×3 IMPLANT
BLADE CARPAL TUNNEL SNGL USE (BLADE) ×3 IMPLANT
BLADE SURG 15 STRL LF DISP TIS (BLADE) ×2 IMPLANT
BLADE SURG 15 STRL SS (BLADE) ×4
BNDG CONFORM 3 STRL LF (GAUZE/BANDAGES/DRESSINGS) ×3 IMPLANT
BRUSH SCRUB EZ PLAIN DRY (MISCELLANEOUS) ×3 IMPLANT
CORD BIPOLAR FORCEPS 12FT (ELECTRODE) ×3 IMPLANT
COVER BACK TABLE 60X90IN (DRAPES) ×3 IMPLANT
CUFF TOURNIQUET SINGLE 18IN (TOURNIQUET CUFF) ×3 IMPLANT
DRAIN PENROSE 1/4X12 LTX STRL (WOUND CARE) IMPLANT
DRAPE EXTREMITY T 121X128X90 (DRAPE) ×3 IMPLANT
DRAPE SURG 17X23 STRL (DRAPES) ×3 IMPLANT
DRSG EMULSION OIL 3X3 NADH (GAUZE/BANDAGES/DRESSINGS) ×3 IMPLANT
GAUZE SPONGE 4X4 12PLY STRL (GAUZE/BANDAGES/DRESSINGS) IMPLANT
GAUZE SPONGE 4X4 16PLY XRAY LF (GAUZE/BANDAGES/DRESSINGS) IMPLANT
GAUZE XEROFORM 1X8 LF (GAUZE/BANDAGES/DRESSINGS) ×3 IMPLANT
GLOVE BIO SURGEON STRL SZ 6.5 (GLOVE) ×4 IMPLANT
GLOVE BIO SURGEONS STRL SZ 6.5 (GLOVE) ×2
GLOVE BIOGEL M STRL SZ7.5 (GLOVE) IMPLANT
GLOVE SS BIOGEL STRL SZ 8 (GLOVE) ×1 IMPLANT
GLOVE SUPERSENSE BIOGEL SZ 8 (GLOVE) ×2
GOWN STRL REUS W/ TWL LRG LVL3 (GOWN DISPOSABLE) ×1 IMPLANT
GOWN STRL REUS W/ TWL XL LVL3 (GOWN DISPOSABLE) ×1 IMPLANT
GOWN STRL REUS W/TWL LRG LVL3 (GOWN DISPOSABLE) ×2
GOWN STRL REUS W/TWL XL LVL3 (GOWN DISPOSABLE) ×2
LOOP VESSEL MAXI BLUE (MISCELLANEOUS) IMPLANT
NDL SAFETY ECLIPSE 18X1.5 (NEEDLE) ×1 IMPLANT
NEEDLE HYPO 18GX1.5 SHARP (NEEDLE) ×2
NEEDLE HYPO 22GX1.5 SAFETY (NEEDLE) IMPLANT
NEEDLE HYPO 25X1 1.5 SAFETY (NEEDLE) ×6 IMPLANT
NS IRRIG 1000ML POUR BTL (IV SOLUTION) ×3 IMPLANT
PACK BASIN DAY SURGERY FS (CUSTOM PROCEDURE TRAY) ×3 IMPLANT
PAD ALCOHOL SWAB (MISCELLANEOUS) ×24 IMPLANT
PAD CAST 3X4 CTTN HI CHSV (CAST SUPPLIES) ×2 IMPLANT
PADDING CAST ABS 4INX4YD NS (CAST SUPPLIES) ×2
PADDING CAST ABS COTTON 4X4 ST (CAST SUPPLIES) ×1 IMPLANT
PADDING CAST COTTON 3X4 STRL (CAST SUPPLIES) ×4
SHEET MEDIUM DRAPE 40X70 STRL (DRAPES) ×3 IMPLANT
STOCKINETTE 4X48 STRL (DRAPES) ×3 IMPLANT
SUT PROLENE 4 0 PS 2 18 (SUTURE) ×3 IMPLANT
SYR BULB 3OZ (MISCELLANEOUS) ×3 IMPLANT
SYR CONTROL 10ML LL (SYRINGE) ×6 IMPLANT
TOWEL OR 17X24 6PK STRL BLUE (TOWEL DISPOSABLE) ×3 IMPLANT
TOWEL OR NON WOVEN STRL DISP B (DISPOSABLE) ×3 IMPLANT
TRAY DSU PREP LF (CUSTOM PROCEDURE TRAY) ×3 IMPLANT
UNDERPAD 30X30 (UNDERPADS AND DIAPERS) ×3 IMPLANT

## 2017-01-08 NOTE — Anesthesia Postprocedure Evaluation (Signed)
Anesthesia Post Note  Patient: Andreas Blower  Procedure(s) Performed: CARPAL TUNNEL RELEASE, POSSIBLE RIGHT THUMB CARPOMETACARPAL INJECTION (Right Wrist)     Patient location during evaluation: PACU Anesthesia Type: MAC Level of consciousness: awake and alert Pain management: pain level controlled Vital Signs Assessment: post-procedure vital signs reviewed and stable Respiratory status: spontaneous breathing, nonlabored ventilation and respiratory function stable Cardiovascular status: stable and blood pressure returned to baseline Postop Assessment: no apparent nausea or vomiting Anesthetic complications: no    Last Vitals:  Vitals:   01/08/17 0842 01/08/17 0851  BP:  (!) 118/58  Pulse: 79   Resp: 13 16  Temp:  36.5 C  SpO2: 96% 97%    Last Pain:  Vitals:   01/08/17 0851  TempSrc: Oral  PainSc: 0-No pain                 Catalina Gravel

## 2017-01-08 NOTE — Transfer of Care (Signed)
Immediate Anesthesia Transfer of Care Note  Patient: Maria Nunez  Procedure(s) Performed: CARPAL TUNNEL RELEASE, POSSIBLE RIGHT THUMB CARPOMETACARPAL INJECTION (Right Wrist)  Patient Location: PACU  Anesthesia Type:MAC  Level of Consciousness: awake, alert , oriented and patient cooperative  Airway & Oxygen Therapy: Patient Spontanous Breathing  Post-op Assessment: Report given to RN and Post -op Vital signs reviewed and stable  Post vital signs: Reviewed and stable  Last Vitals:  Vitals:   01/08/17 0724 01/08/17 0815  BP: (!) 141/63   Pulse: 84   Resp: 16   Temp: 36.8 C (P) 36.7 C  SpO2: 97% (P) 96%    Last Pain:  Vitals:   01/08/17 0724  TempSrc: Oral      Patients Stated Pain Goal: 3 (11/94/17 4081)  Complications: No apparent anesthesia complications

## 2017-01-08 NOTE — Anesthesia Procedure Notes (Signed)
Procedure Name: MAC Date/Time: 01/08/2017 7:48 AM Performed by: Signe Colt, CRNA Pre-anesthesia Checklist: Patient identified, Emergency Drugs available, Suction available, Patient being monitored and Timeout performed Patient Re-evaluated:Patient Re-evaluated prior to induction Oxygen Delivery Method: Simple face mask

## 2017-01-08 NOTE — Op Note (Signed)
See [id number] SP [facility] Maria Papania Md

## 2017-01-08 NOTE — H&P (Signed)
Maria Nunez is an 56 y.o. female.   Chief Complaint: R CTS HPI: Patient presents for evaluation and treatment of the of their upper extremity predicament. The patient denies neck, back, chest or  abdominal pain. The patient notes that they have no lower extremity problems. The patients primary complaint is noted. We are planning surgical care pathway for the upper extremity.  Past Medical History:  Diagnosis Date  . Abnormal ultrasound of ovary 2016   Ultrasound showing bilateral ovaries in contact with one another.  Asymptomatic.  [name] Allergy   . Diabetes mellitus    borderline  . Fibroid   . Heart murmur   . Hidradenitis   . Hypertension     Past Surgical History:  Procedure Laterality Date  . ABDOMINAL HYSTERECTOMY     supracervical  . BREAST EXCISIONAL BIOPSY    . BREAST SURGERY     cysts removed right breast  . CESAREAN SECTION    . INGUINAL HIDRADENITIS EXCISION    . LEEP  05/04/13   HPV change    Family History  Problem Relation Age of Onset  . Asthma Mother   . COPD Mother   . Diabetes Mother   . Hypertension Mother   . Diabetes Father   . Hypertension Father   . Breast cancer Maternal Aunt   . Breast cancer Paternal Aunt    Social History:  reports that  has never smoked. she has never used smokeless tobacco. She reports that she does not drink alcohol or use drugs.  Allergies:  Allergies  Allergen Reactions  . Cefaclor   . Cephalexin   . Hycodan [Hydrocodone-Homatropine] Itching  . Januvia [Sitagliptin] Nausea Only    Back pain  . Penicillins   . Sulfonamide Derivatives     REACTION: hives  . Tetracycline   . Victoza [Liraglutide] Other (See Comments)    Back pain  . Xigduo Xr [Dapagliflozin-Metformin Hcl Er] Hives    Medications Prior to Admission  Medication Sig Dispense Refill  . aspirin 81 MG tablet Take 81 mg by mouth daily.      . Blood Glucose Monitoring Suppl (FREESTYLE LITE) DEVI   0  . Cyanocobalamin (B-12) 500 MCG TABS Take 1  tablet by mouth daily.    [name] FOLIC ACID PO Take 416 mcg by mouth daily.     [name] glucose blood test strip accu chek strips. For testing twice daily. DX: E11.9 100 each 12  . Lancet Devices (ACCU-CHEK SOFTCLIX) lancets Use as instructed 1 each 11  . losartan-hydrochlorothiazide (HYZAAR) 100-25 MG tablet TAKE 1 TABLET BY MOUTH DAILY. 90 tablet PRN  . metFORMIN (GLUCOPHAGE) 1000 MG tablet Take 1 tablet (1,000 mg total) by mouth 2 (two) times daily with a meal. 180 tablet 3  . Multiple Vitamin (MULTIVITAMIN) tablet Take 1 tablet by mouth daily.      . Omega-3 Fatty Acids (FISH OIL) 1000 MG CAPS Take 1,000 mg by mouth daily.     . pravastatin (PRAVACHOL) 40 MG tablet TAKE 1 TABLET BY MOUTH ONCE DAILY 90 tablet 3  . pyridOXINE (VITAMIN B-6) 100 MG tablet Take 100 mg by mouth daily.    . Zinc Sulfate (ZINC 15 PO) Take by mouth.      . escitalopram (LEXAPRO) 10 MG tablet Take 1 tablet (10 mg total) by mouth daily. 30 tablet 3    Results for orders placed or performed during the hospital encounter of 01/08/17 (from the past 48 hour(s))  I-STAT, chem 8  Status: Abnormal   Collection Time: 01/08/17  7:19 AM  Result Value Ref Range   Sodium 139 135 - 145 mmol/L   Potassium 3.4 (L) 3.5 - 5.1 mmol/L   Chloride 100 (L) 101 - 111 mmol/L   BUN 13 6 - 20 mg/dL   Creatinine, Ser 0.50 0.44 - 1.00 mg/dL   Glucose, Bld 133 (H) 65 - 99 mg/dL   Calcium, Ion 1.02 (L) 1.15 - 1.40 mmol/L   TCO2 24 22 - 32 mmol/L   Hemoglobin 14.6 12.0 - 15.0 g/dL   HCT 43.0 36.0 - 46.0 %   No results found.  Review of Systems  Respiratory: Negative.   Cardiovascular: Negative.   Gastrointestinal: Negative.   Genitourinary: Negative.     Blood pressure (!) 141/63, pulse 84, temperature 98.3 F (36.8 C), temperature source Oral, resp. rate 16, height 4\' 11"  (1.499 m), weight 74.4 kg (164 lb), last menstrual period 01/27/2000, SpO2 97 %. Physical Exam R CTS  The patient is alert and oriented in no acute distress. The  patient complains of pain in the affected upper extremity.  The patient is noted to have a normal HEENT exam. Lung fields show equal chest expansion and no shortness of breath. Abdomen exam is nontender without distention. Lower extremity examination does not show any fracture dislocation or blood clot symptoms. Pelvis is stable and the neck and back are stable and nontender.  Assessment/Plan Plan Rt CTR  We are planning surgery for your upper extremity. The risk and benefits of surgery to include risk of bleeding, infection, anesthesia,  damage to normal structures and failure of the surgery to accomplish its intended goals of relieving symptoms and restoring function have been discussed in detail. With this in mind we plan to proceed. I have specifically discussed with the patient the pre-and postoperative regime and the dos and don'ts and risk and benefits in great detail. Risk and benefits of surgery also include risk of dystrophy(CRPS), chronic nerve pain, failure of the healing process to go onto completion and other inherent risks of surgery The relavent the pathophysiology of the disease/injury process, as well as the alternatives for treatment and postoperative course of action has been discussed in great detail with the patient who desires to proceed.  We will do everything in our power to help you (the patient) restore function to the upper extremity. It is a pleasure to see this patient today.   Willa Frater III, MD 01/08/2017, 7:36 AM

## 2017-01-08 NOTE — Anesthesia Preprocedure Evaluation (Signed)
Anesthesia Evaluation  Patient identified by MRN, date of birth, ID band Patient awake    Reviewed: Allergy & Precautions, NPO status , Patient's Chart, lab work & pertinent test results  Airway Mallampati: II  TM Distance: >3 FB Neck ROM: Full    Dental  (+) Teeth Intact, Dental Advisory Given   Pulmonary neg pulmonary ROS,    Pulmonary exam normal breath sounds clear to auscultation       Cardiovascular hypertension, Pt. on medications Normal cardiovascular exam Rhythm:Regular Rate:Normal     Neuro/Psych PSYCHIATRIC DISORDERS Anxiety negative neurological ROS     GI/Hepatic negative GI ROS, Neg liver ROS,   Endo/Other  diabetes, Type 2, Oral Hypoglycemic AgentsObesity   Renal/GU negative Renal ROS     Musculoskeletal  (+) Arthritis , Osteoarthritis,    Abdominal   Peds  Hematology negative hematology ROS (+)   Anesthesia Other Findings Day of surgery medications reviewed with the patient.  Reproductive/Obstetrics                             Anesthesia Physical Anesthesia Plan  ASA: II  Anesthesia Plan: MAC   Post-op Pain Management:    Induction: Intravenous  PONV Risk Score and Plan: 2 and Treatment may vary due to age or medical condition, Propofol infusion and Ondansetron  Airway Management Planned: Nasal Cannula  Additional Equipment:   Intra-op Plan:   Post-operative Plan:   Informed Consent: I have reviewed the patients History and Physical, chart, labs and discussed the procedure including the risks, benefits and alternatives for the proposed anesthesia with the patient or authorized representative who has indicated his/her understanding and acceptance.   Dental advisory given  Plan Discussed with: CRNA and Anesthesiologist  Anesthesia Plan Comments: (Discussed risks/benefits/alternatives to MAC sedation including need for ventilatory support, hypotension, need  for conversion to general anesthesia.  All patient questions answered.  Patient/guardian wishes to proceed.)        Anesthesia Quick Evaluation

## 2017-01-08 NOTE — Discharge Instructions (Signed)
Keep bandage clean and dry.  Call for any problems.  No smoking.  Criteria for driving a car: you should be off your pain medicine for 7-8 hours, able to drive one handed(confident), thinking clearly and feeling able in your judgement to drive. °Continue elevation as it will decrease swelling.  If instructed by MD move your fingers within the confines of the bandage/splint.  Use ice if instructed by your MD. Call immediately for any sudden loss of feeling in your hand/arm or change in functional abilities of the extremity.We recommend that you to take vitamin C 1000 mg a day to promote healing. °We also recommend that if you require  pain medicine that you take a stool softener to prevent constipation as most pain medicines will have constipation side effects. We recommend either Peri-Colace or Senokot and recommend that you also consider adding MiraLAX as well to prevent the constipation affects from pain medicine if you are required to use them. These medicines are over the counter and may be purchased at a local pharmacy. A cup of yogurt and a probiotic can also be helpful during the recovery process as the medicines can disrupt your intestinal environment. ° ° ° ° °Post Anesthesia Home Care Instructions ° °Activity: °Get plenty of rest for the remainder of the day. A responsible individual must stay with you for 24 hours following the procedure.  °For the next 24 hours, DO NOT: °-Drive a car °-Operate machinery °-Drink alcoholic beverages °-Take any medication unless instructed by your physician °-Make any legal decisions or sign important papers. ° °Meals: °Start with liquid foods such as gelatin or soup. Progress to regular foods as tolerated. Avoid greasy, spicy, heavy foods. If nausea and/or vomiting occur, drink only clear liquids until the nausea and/or vomiting subsides. Call your physician if vomiting continues. ° °Special Instructions/Symptoms: °Your throat may feel dry or sore from the anesthesia or  the breathing tube placed in your throat during surgery. If this causes discomfort, gargle with warm salt water. The discomfort should disappear within 24 hours. ° °If you had a scopolamine patch placed behind your ear for the management of post- operative nausea and/or vomiting: ° °1. The medication in the patch is effective for 72 hours, after which it should be removed.  Wrap patch in a tissue and discard in the trash. Wash hands thoroughly with soap and water. °2. You may remove the patch earlier than 72 hours if you experience unpleasant side effects which may include dry mouth, dizziness or visual disturbances. °3. Avoid touching the patch. Wash your hands with soap and water after contact with the patch. °   ° °

## 2017-01-11 ENCOUNTER — Encounter (HOSPITAL_BASED_OUTPATIENT_CLINIC_OR_DEPARTMENT_OTHER): Payer: Self-pay | Admitting: Orthopedic Surgery

## 2017-01-11 NOTE — Op Note (Signed)
NAMEJOSSALIN, Maria Nunez           ACCOUNT NO.:  [id number]  MEDICAL RECORD NO.:  [id number]  LOCATION:                                 FACILITY:  PHYSICIAN:  Satira Anis. Amedeo Plenty, M.D.     DATE OF BIRTH:  DATE OF PROCEDURE:  01/08/2017 DATE OF DISCHARGE:                              OPERATIVE REPORT   PREOPERATIVE DIAGNOSIS:  Right carpal tunnel syndrome.  POSTOPERATIVE DIAGNOSIS:  Right carpal tunnel syndrome.  PROCEDURE: 1. Right median nerve/peripheral nerve block at the wrist-forearm     level for anesthetic purposes for carpal tunnel release. 2. Right limited open carpal tunnel release.  SURGEON:  Satira Anis. Amedeo Plenty, MD.  ASSISTANT:  None.  COMPLICATIONS:  None.  ANESTHESIA:  Peripheral nerve block with IV sedation keeping the patient awake, alert, and oriented.  OPERATION IN DETAIL:  The patient was seen by myself and Anesthesia, taken to the operative theater, underwent smooth induction of peripheral nerve/median nerve block with Celestone with a combination of lidocaine and Sensorcaine without epinephrine and a small amount of bicarb added into the mixture.  Once field and median nerve block was created, we then prepped her a second time with Betadine scrub and paint.  Sterile field was secured.  Operation commenced with elevation of the tourniquet.  Incision 1 cm in nature was made at the distal edge of the transverse carpal ligament.  The patient tolerated this well.  The patient had a small retractor placed.  Palmar fascia incised.  Distal edge of the transverse carpal ligament was released under 4.0 loupe magnification and following this, the fat pad egressed nicely. Superficial palmar arch was protected as was the median nerve and distal proximal dissection was then carried down until adequate room was available for canal, prepared toward device 1, 2, and 3 which were placed just under the proximal leading leaflet of transverse carpal ligament.  Once these were  passed into the proximal leaflet, the obturator was disengaged from the security clip as it was passed and the security knife was placed and security clip effectively releasing the proximal leaflet.  The patient tolerated this well.  She was awake, alert, oriented and there were no issues.  She had a very hyperemic median nerve, it was nicely decompressed and there were no complicating features.  Following decompression, we then irrigated copiously and closed wound with Prolene.  The patient tolerated this well.  There were no complicating features.  All sponge, needle, and instrument counts were reported as correct.  The patient tolerated the procedure well.  This was an uncomplicated carpal tunnel release with the patient awake, alert, and oriented.  The patient was discharged on OxyIR p.r.n. pain. She will elevate, move, massage fingers and adhere to our standard postop protocol.  Should any problems arise, she will notify me.  We will look forward to seeing her back in the office in 1 week.     Satira Anis. Amedeo Plenty, M.D.     [facility]  D:  01/08/2017  T:  01/09/2017  Job:  [id number]

## 2017-01-15 DIAGNOSIS — G5601 Carpal tunnel syndrome, right upper limb: Secondary | ICD-10-CM | POA: Diagnosis not present

## 2017-01-22 DIAGNOSIS — M79641 Pain in right hand: Secondary | ICD-10-CM | POA: Diagnosis not present

## 2017-01-27 ENCOUNTER — Ambulatory Visit: Payer: 59 | Admitting: Obstetrics and Gynecology

## 2017-01-29 ENCOUNTER — Encounter: Payer: 59 | Admitting: Family Medicine

## 2017-02-10 DIAGNOSIS — M1811 Unilateral primary osteoarthritis of first carpometacarpal joint, right hand: Secondary | ICD-10-CM | POA: Diagnosis not present

## 2017-02-10 DIAGNOSIS — Z4789 Encounter for other orthopedic aftercare: Secondary | ICD-10-CM | POA: Diagnosis not present

## 2017-02-10 DIAGNOSIS — M189 Osteoarthritis of first carpometacarpal joint, unspecified: Secondary | ICD-10-CM | POA: Diagnosis not present

## 2017-02-10 DIAGNOSIS — G5601 Carpal tunnel syndrome, right upper limb: Secondary | ICD-10-CM | POA: Diagnosis not present

## 2017-03-09 ENCOUNTER — Other Ambulatory Visit (HOSPITAL_COMMUNITY)
Admission: RE | Admit: 2017-03-09 | Discharge: 2017-03-09 | Disposition: A | Discharge status: Home / Self Care | Payer: 59 | Source: Ambulatory Visit | Attending: Obstetrics and Gynecology | Admitting: Obstetrics and Gynecology

## 2017-03-09 ENCOUNTER — Other Ambulatory Visit: Payer: Self-pay

## 2017-03-09 ENCOUNTER — Ambulatory Visit: Payer: 59 | Admitting: Obstetrics and Gynecology

## 2017-03-09 ENCOUNTER — Encounter: Payer: Self-pay | Admitting: Obstetrics and Gynecology

## 2017-03-09 VITALS — BP 122/70 | HR 80 | Resp 16 | Ht 59.5 in | Wt 165.0 lb

## 2017-03-09 DIAGNOSIS — Z01419 Encounter for gynecological examination (general) (routine) without abnormal findings: Secondary | ICD-10-CM | POA: Insufficient documentation

## 2017-03-09 DIAGNOSIS — R935 Abnormal findings on diagnostic imaging of other abdominal regions, including retroperitoneum: Secondary | ICD-10-CM | POA: Diagnosis not present

## 2017-03-09 MED FILL — ACCU-CHEK GUIDE TEST STRIP: 50 days supply | Qty: 100 | Fill #4

## 2017-03-09 MED FILL — PRAVASTATIN NA 40 MG TAB: 40 | 90 days supply | Qty: 90 | Fill #3

## 2017-03-09 MED FILL — LOSARTAN-HCTZ 100-25 MG TAB: 100-25 | 90 days supply | Qty: 90 | Fill #3

## 2017-03-09 MED FILL — metFORMIN HCL 1000 MG TABS: 1000 | 90 days supply | Qty: 180 | Fill #3

## 2017-03-09 NOTE — Progress Notes (Signed)
57 y.o. G63P2012 Divorced [name] here for annual exam.    Worried about her ovaries.  Prior US 2016 showing they are adherent to one another.  No pain or cramping.  No vaginal bleeding.  No changes bowel function.  No urinary urgency, frequency, or blood in urine.   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   Hgb A1C 6.9.  PCP:   Dr. Madilyn Fireman  Patient's last menstrual period was 01/27/2000 (approximate).           Sexually active: No.  The current method of family planning is status post hysterectomy -- SUPRACERVICAL    Exercising: Yes.    walking and treadmill Smoker:  no  Health Maintenance: Pap:  11/14/15 Pap and HR HPV negative History of abnormal Pap:  Yes, 03/24/13 LSIL CIN I;Pos HR HPV. Leep 05/04/13 showing HPV effect only. No dysplasia. MMG:  07/14/16 BIRADS 1 negative/density c Colonoscopy:   07-24-13 normal with Dr. Lenise Herald 06/2023 BMD:   n/a  Result  n/a TDaP:  2016 Gardasil:   n/a HIV and Hep C: 03/23/14 Negative Screening Labs:  PCP   reports that  has never smoked. she has never used smokeless tobacco. She reports that she does not drink alcohol or use drugs.  Past Medical History:  Diagnosis Date  . Abnormal ultrasound of ovary 2016   Ultrasound showing bilateral ovaries in contact with one another.  Asymptomatic.  [name] Allergy   . Diabetes mellitus    borderline  . Fibroid   . Heart murmur   . Hidradenitis   . Hypertension     Past Surgical History:  Procedure Laterality Date  . ABDOMINAL HYSTERECTOMY     supracervical  . BREAST EXCISIONAL BIOPSY    . BREAST SURGERY     cysts removed right breast  . CARPAL TUNNEL RELEASE Right 01/08/2017   Procedure: CARPAL TUNNEL RELEASE, POSSIBLE RIGHT THUMB CARPOMETACARPAL INJECTION;  Surgeon: Roseanne Kaufman, MD;  Location: [facility];  Service: Orthopedics;  Laterality: Right;  60 MINS  . CESAREAN SECTION    . INGUINAL HIDRADENITIS EXCISION    . LEEP  05/04/13   HPV change    Current  Outpatient Medications  Medication Sig Dispense Refill  . aspirin 81 MG tablet Take 81 mg by mouth daily.      . Blood Glucose Monitoring Suppl (FREESTYLE LITE) DEVI   0  . Cyanocobalamin (B-12) 500 MCG TABS Take 1 tablet by mouth daily.    [name] escitalopram (LEXAPRO) 10 MG tablet Take 1 tablet (10 mg total) by mouth daily. 30 tablet 3  . FOLIC ACID PO Take 202 mcg by mouth daily.     [name] glucose blood test strip accu chek strips. For testing twice daily. DX: E11.9 100 each 12  . Lancet Devices (ACCU-CHEK SOFTCLIX) lancets Use as instructed 1 each 11  . losartan-hydrochlorothiazide (HYZAAR) 100-25 MG tablet TAKE 1 TABLET BY MOUTH DAILY. 90 tablet PRN  . metFORMIN (GLUCOPHAGE) 1000 MG tablet Take 1 tablet (1,000 mg total) by mouth 2 (two) times daily with a meal. 180 tablet 3  . Multiple Vitamin (MULTIVITAMIN) tablet Take 1 tablet by mouth daily.      . Omega-3 Fatty Acids (FISH OIL) 1000 MG CAPS Take 1,000 mg by mouth daily.     . pravastatin (PRAVACHOL) 40 MG tablet TAKE 1 TABLET BY MOUTH ONCE DAILY 90 tablet 3  . pyridOXINE (VITAMIN B-6) 100 MG tablet Take 100 mg by mouth daily.    [name] Zinc  Sulfate (ZINC 15 PO) Take by mouth.       No current facility-administered medications for this visit.     Family History  Problem Relation Age of Onset  . Asthma Mother   . COPD Mother   . Diabetes Mother   . Hypertension Mother   . Diabetes Father   . Hypertension Father   . Breast cancer Maternal Aunt   . Breast cancer Paternal Aunt     ROS:  Pertinent items are noted in HPI.  Otherwise, a comprehensive ROS was negative.  Exam:   BP 122/70 (BP Location: Right Arm, Patient Position: Sitting, Cuff Size: Normal)   Pulse 80   Resp 16   Ht 4' 11.5" (1.511 m)   Wt 165 lb (74.8 kg)   LMP 01/27/2000 (Approximate)   BMI 32.77 kg/m     General appearance: alert, cooperative and appears stated age Head: Normocephalic, without obvious abnormality, atraumatic Neck: no adenopathy, supple, symmetrical,  trachea midline and thyroid normal to inspection and palpation Lungs: clear to auscultation bilaterally Breasts: normal appearance, no masses or tenderness, No nipple retraction or dimpling, No nipple discharge or bleeding, No axillary or supraclavicular adenopathy Heart: regular rate and rhythm Abdomen: soft, non-tender; no masses, no organomegaly Extremities: extremities normal, atraumatic, no cyanosis or edema Skin: Skin color, texture, turgor normal. No rashes or lesions Lymph nodes: Cervical, supraclavicular, and axillary nodes normal. No abnormal inguinal nodes palpated Neurologic: Grossly normal  Pelvic: External genitalia:  no lesions              Urethra:  normal appearing urethra with no masses, tenderness or lesions              Bartholins and Skenes: normal                 Vagina: normal appearing vagina with normal color and discharge, no lesions              Cervix: no lesions              Pap taken: Yes.   Bimanual Exam:  Uterus:   Absent.              Adnexa: no mass, fullness, tenderness              Rectal exam: Yes.  .  Confirms.              Anus:  normal sphincter tone, no lesions  Chaperone was present for exam.  Assessment:   Well woman visit with normal exam. Hx supracervical hysterectomy.  Hx LGSIL pap.  LEEP showing HPV effect in 2015.  Status post right breast biopsies.  Abnormal ovaries on ultrasound.  Touching one another. DM.  Plan: Mammogram screening discussed. Recommended self breast awareness. Pap and HR HPV as above. Guidelines for Calcium, Vitamin D, regular exercise program including cardiovascular and weight bearing exercise. Proceed with pelvic US to re-eval ovaries.  Follow up annually and prn.   After visit summary provided.

## 2017-03-09 NOTE — Patient Instructions (Signed)

## 2017-03-12 LAB — CYTOLOGY - PAP
Diagnosis: NEGATIVE
HPV: NOT DETECTED

## 2017-03-18 ENCOUNTER — Other Ambulatory Visit: Payer: Self-pay

## 2017-03-18 ENCOUNTER — Ambulatory Visit (INDEPENDENT_AMBULATORY_CARE_PROVIDER_SITE_OTHER): Payer: 59

## 2017-03-18 ENCOUNTER — Ambulatory Visit: Payer: 59 | Admitting: Obstetrics and Gynecology

## 2017-03-18 ENCOUNTER — Encounter: Payer: Self-pay | Admitting: Obstetrics and Gynecology

## 2017-03-18 VITALS — BP 134/82 | HR 84 | Resp 16 | Ht 59.0 in | Wt 165.0 lb

## 2017-03-18 DIAGNOSIS — R935 Abnormal findings on diagnostic imaging of other abdominal regions, including retroperitoneum: Secondary | ICD-10-CM

## 2017-03-18 NOTE — Progress Notes (Signed)
Encounter reviewed by Dr. Brook Amundson C. Silva.  

## 2017-03-18 NOTE — Progress Notes (Signed)
GYNECOLOGY  VISIT   HPI: 57 y.o.   Divorced  Caucasian  female   [phone] with Patient's last menstrual period was 01/27/2000 (approximate).   here for  Ultrasound for check of the ovaries.  Had a supracervical hysterectomy and has bilateral ovaries which appear to be adherent to one another. . Denies pain or pain with intercourse.   GYNECOLOGIC HISTORY: Patient's last menstrual period was 01/27/2000 (approximate). Contraception:  Supracervical  Menopausal hormone therapy:  none Last mammogram:   07/14/16 BIRADS 1 negative/density c Last pap smear:   03/09/17 Pap and HR HPV negative        OB History    Gravida Para Term Preterm AB Living   3 2 2   1 2    SAB TAB Ectopic Multiple Live Births   1                 Patient Active Problem List   Diagnosis Date Noted  . Bursitis of left shoulder 01/24/2016  . GAD (generalized anxiety disorder) 04/02/2015  . Localized primary osteoarthritis of carpometacarpal joint of right thumb 03/15/2015  . Carpal tunnel syndrome, bilateral 04/05/2013  . LGSIL (low grade squamous intraepithelial dysplasia) 06/16/2012  . Diabetes mellitus without complication ([place]) 67/89/3810  . PANIC ATTACK 09/07/2008  . RESTLESS LEG SYNDROME 04/02/2008  . POLYARTHRITIS 04/02/2008  . KNEE PAIN, LEFT 01/02/2008  . HOT FLASHES 08/04/2006  . HYPERCHOLESTEROLEMIA 11/03/2005  . HYPERTENSION, BENIGN SYSTEMIC 11/03/2005    Past Medical History:  Diagnosis Date  . Abnormal ultrasound of ovary 2016   Ultrasound showing bilateral ovaries in contact with one another.  Asymptomatic.  [name] Allergy   . Diabetes mellitus    borderline  . Fibroid   . Heart murmur   . Hidradenitis   . Hypertension     Past Surgical History:  Procedure Laterality Date  . ABDOMINAL HYSTERECTOMY     supracervical  . BREAST EXCISIONAL BIOPSY    . BREAST SURGERY     cysts removed right breast  . CARPAL TUNNEL RELEASE Right 01/08/2017   Procedure: CARPAL TUNNEL RELEASE, POSSIBLE RIGHT  THUMB CARPOMETACARPAL INJECTION;  Surgeon: Roseanne Kaufman, MD;  Location: [facility];  Service: Orthopedics;  Laterality: Right;  60 MINS  . CESAREAN SECTION    . INGUINAL HIDRADENITIS EXCISION    . LEEP  05/04/13   HPV change    Current Outpatient Medications  Medication Sig Dispense Refill  . aspirin 81 MG tablet Take 81 mg by mouth daily.      . Blood Glucose Monitoring Suppl (FREESTYLE LITE) DEVI   0  . Cyanocobalamin (B-12) 500 MCG TABS Take 1 tablet by mouth daily.    [name] escitalopram (LEXAPRO) 10 MG tablet Take 1 tablet (10 mg total) by mouth daily. 30 tablet 3  . FOLIC ACID PO Take 175 mcg by mouth daily.     [name] glucose blood test strip accu chek strips. For testing twice daily. DX: E11.9 100 each 12  . Lancet Devices (ACCU-CHEK SOFTCLIX) lancets Use as instructed 1 each 11  . losartan-hydrochlorothiazide (HYZAAR) 100-25 MG tablet TAKE 1 TABLET BY MOUTH DAILY. 90 tablet PRN  . metFORMIN (GLUCOPHAGE) 1000 MG tablet Take 1 tablet (1,000 mg total) by mouth 2 (two) times daily with a meal. 180 tablet 3  . Multiple Vitamin (MULTIVITAMIN) tablet Take 1 tablet by mouth daily.      . Omega-3 Fatty Acids (FISH OIL) 1000 MG CAPS Take 1,000 mg by mouth daily.     [name]  pravastatin (PRAVACHOL) 40 MG tablet TAKE 1 TABLET BY MOUTH ONCE DAILY 90 tablet 3  . pyridOXINE (VITAMIN B-6) 100 MG tablet Take 100 mg by mouth daily.    . Zinc Sulfate (ZINC 15 PO) Take by mouth.       No current facility-administered medications for this visit.      ALLERGIES: Cefaclor; Cephalexin; Hycodan [hydrocodone-homatropine]; Januvia [sitagliptin]; Penicillins; Sulfonamide derivatives; Tetracycline; Victoza [liraglutide]; and Xigduo xr [dapagliflozin-metformin hcl er]  Family History  Problem Relation Age of Onset  . Asthma Mother   . COPD Mother   . Diabetes Mother   . Hypertension Mother   . Diabetes Father   . Hypertension Father   . Breast cancer Maternal Aunt   . Breast cancer Paternal Aunt      Social History   Socioeconomic History  . Marital status: Divorced    Spouse name: Not on file  . Number of children: Not on file  . Years of education: Not on file  . Highest education level: Not on file  Social Needs  . Financial resource strain: Not on file  . Food insecurity - worry: Not on file  . Food insecurity - inability: Not on file  . Transportation needs - medical: Not on file  . Transportation needs - non-medical: Not on file  Occupational History  . Not on file  Tobacco Use  . Smoking status: Never Smoker  . Smokeless tobacco: Never Used  Substance and Sexual Activity  . Alcohol use: No    Alcohol/week: 0.0 oz  . Drug use: No  . Sexual activity: No    Partners: Male    Birth control/protection: Surgical    Comment: TAH/supracervical  Other Topics Concern  . Not on file  Social History Narrative  . Not on file    ROS:  Pertinent items are noted in HPI.  PHYSICAL EXAMINATION:    BP 134/82 (BP Location: Left Arm, Patient Position: Sitting, Cuff Size: Normal)   Pulse 84   Resp 16   Ht 4\' 11"  (1.499 m)   Wt 165 lb (74.8 kg)   LMP 01/27/2000 (Approximate)   BMI 33.33 kg/m     General appearance: alert, cooperative and appears stated age   Pelvic US Absent uterus.  Cervix present.  Ovaries adherent to one another. No free fluid.  ASSESSMENT  Ovarian adhesions. Cervical stump present.  Hx prior long standing LGSIL.  PLAN  I discussed the lack of findings of ovarian cancer. We discussed fibrosis and mechanisms for this forming - endometriosis, surgery, inflammation.  We discussed potential surgical intervention if she develops dyspareunia.  I would anticipated laparoscopic BSO, LOA, and possible trachelectomy if she needed surgical intervention. I reviewed signs of small bowel obstruction.   An After Visit Summary was printed and given to the patient.  ___15___ minutes face to face time of which over 50% was spent in counseling.

## 2017-03-29 ENCOUNTER — Ambulatory Visit (INDEPENDENT_AMBULATORY_CARE_PROVIDER_SITE_OTHER): Payer: 59 | Admitting: Family Medicine

## 2017-03-29 ENCOUNTER — Encounter: Payer: Self-pay | Admitting: Family Medicine

## 2017-03-29 VITALS — BP 140/63 | HR 80 | Ht 59.0 in | Wt 165.0 lb

## 2017-03-29 DIAGNOSIS — Z0000 Encounter for general adult medical examination without abnormal findings: Secondary | ICD-10-CM

## 2017-03-29 NOTE — Patient Instructions (Addendum)
Health Maintenance for Postmenopausal Women Menopause is a normal process in which your reproductive ability comes to an end. This process happens gradually over a span of months to years, usually between the ages of 22 and 9. Menopause is complete when you have missed 12 consecutive menstrual periods. It is important to talk with your health care provider about some of the most common conditions that affect postmenopausal women, such as heart disease, cancer, and bone loss (osteoporosis). Adopting a healthy lifestyle and getting preventive care can help to promote your health and wellness. Those actions can also lower your chances of developing some of these common conditions. What should I know about menopause? During menopause, you may experience a number of symptoms, such as:  Moderate-to-severe hot flashes.  Night sweats.  Decrease in sex drive.  Mood swings.  Headaches.  Tiredness.  Irritability.  Memory problems.  Insomnia.  Choosing to treat or not to treat menopausal changes is an individual decision that you make with your health care provider. What should I know about hormone replacement therapy and supplements? Hormone therapy products are effective for treating symptoms that are associated with menopause, such as hot flashes and night sweats. Hormone replacement carries certain risks, especially as you become older. If you are thinking about using estrogen or estrogen with progestin treatments, discuss the benefits and risks with your health care provider. What should I know about heart disease and stroke? Heart disease, heart attack, and stroke become more likely as you age. This may be due, in part, to the hormonal changes that your body experiences during menopause. These can affect how your body processes dietary fats, triglycerides, and cholesterol. Heart attack and stroke are both medical emergencies. There are many things that you can do to help prevent heart disease  and stroke:  Have your blood pressure checked at least every 1-2 years. High blood pressure causes heart disease and increases the risk of stroke.  If you are 53-22 years old, ask your health care provider if you should take aspirin to prevent a heart attack or a stroke.  Do not use any tobacco products, including cigarettes, chewing tobacco, or electronic cigarettes. If you need help quitting, ask your health care provider.  It is important to eat a healthy diet and maintain a healthy weight. ? Be sure to include plenty of vegetables, fruits, low-fat dairy products, and lean protein. ? Avoid eating foods that are high in solid fats, added sugars, or salt (sodium).  Get regular exercise. This is one of the most important things that you can do for your health. ? Try to exercise for at least 150 minutes each week. The type of exercise that you do should increase your heart rate and make you sweat. This is known as moderate-intensity exercise. ? Try to do strengthening exercises at least twice each week. Do these in addition to the moderate-intensity exercise.  Know your numbers.Ask your health care provider to check your cholesterol and your blood glucose. Continue to have your blood tested as directed by your health care provider.  What should I know about cancer screening? There are several types of cancer. Take the following steps to reduce your risk and to catch any cancer development as early as possible. Breast Cancer  Practice breast self-awareness. ? This means understanding how your breasts normally appear and feel. ? It also means doing regular breast self-exams. Let your health care provider know about any changes, no matter how small.  If you are 40  or older, have a clinician do a breast exam (clinical breast exam or CBE) every year. Depending on your age, family history, and medical history, it may be recommended that you also have a yearly breast X-ray (mammogram).  If you  have a family history of breast cancer, talk with your health care provider about genetic screening.  If you are at high risk for breast cancer, talk with your health care provider about having an MRI and a mammogram every year.  Breast cancer (BRCA) gene test is recommended for women who have family members with BRCA-related cancers. Results of the assessment will determine the need for genetic counseling and BRCA1 and for BRCA2 testing. BRCA-related cancers include these types: ? Breast. This occurs in males or females. ? Ovarian. ? Tubal. This may also be called fallopian tube cancer. ? Cancer of the abdominal or pelvic lining (peritoneal cancer). ? Prostate. ? Pancreatic.  Cervical, Uterine, and Ovarian Cancer Your health care provider may recommend that you be screened regularly for cancer of the pelvic organs. These include your ovaries, uterus, and vagina. This screening involves a pelvic exam, which includes checking for microscopic changes to the surface of your cervix (Pap test).  For women ages 21-65, health care providers may recommend a pelvic exam and a Pap test every three years. For women ages 79-65, they may recommend the Pap test and pelvic exam, combined with testing for human papilloma virus (HPV), every five years. Some types of HPV increase your risk of cervical cancer. Testing for HPV may also be done on women of any age who have unclear Pap test results.  Other health care providers may not recommend any screening for nonpregnant women who are considered low risk for pelvic cancer and have no symptoms. Ask your health care provider if a screening pelvic exam is right for you.  If you have had past treatment for cervical cancer or a condition that could lead to cancer, you need Pap tests and screening for cancer for at least 20 years after your treatment. If Pap tests have been discontinued for you, your risk factors (such as having a new sexual partner) need to be  reassessed to determine if you should start having screenings again. Some women have medical problems that increase the chance of getting cervical cancer. In these cases, your health care provider may recommend that you have screening and Pap tests more often.  If you have a family history of uterine cancer or ovarian cancer, talk with your health care provider about genetic screening.  If you have vaginal bleeding after reaching menopause, tell your health care provider.  There are currently no reliable tests available to screen for ovarian cancer.  Lung Cancer Lung cancer screening is recommended for adults 69-62 years old who are at high risk for lung cancer because of a history of smoking. A yearly low-dose CT scan of the lungs is recommended if you:  Currently smoke.  Have a history of at least 30 pack-years of smoking and you currently smoke or have quit within the past 15 years. A pack-year is smoking an average of one pack of cigarettes per day for one year.  Yearly screening should:  Continue until it has been 15 years since you quit.  Stop if you develop a health problem that would prevent you from having lung cancer treatment.  Colorectal Cancer  This type of cancer can be detected and can often be prevented.  Routine colorectal cancer screening usually begins at  age 42 and continues through age 45.  If you have risk factors for colon cancer, your health care provider may recommend that you be screened at an earlier age.  If you have a family history of colorectal cancer, talk with your health care provider about genetic screening.  Your health care provider may also recommend using home test kits to check for hidden blood in your stool.  A small camera at the end of a tube can be used to examine your colon directly (sigmoidoscopy or colonoscopy). This is done to check for the earliest forms of colorectal cancer.  Direct examination of the colon should be repeated every  5-10 years until age 71. However, if early forms of precancerous polyps or small growths are found or if you have a family history or genetic risk for colorectal cancer, you may need to be screened more often.  Skin Cancer  Check your skin from head to toe regularly.  Monitor any moles. Be sure to tell your health care provider: ? About any new moles or changes in moles, especially if there is a change in a mole's shape or color. ? If you have a mole that is larger than the size of a pencil eraser.  If any of your family members has a history of skin cancer, especially at a young age, talk with your health care provider about genetic screening.  Always use sunscreen. Apply sunscreen liberally and repeatedly throughout the day.  Whenever you are outside, protect yourself by wearing long sleeves, pants, a wide-brimmed hat, and sunglasses.  What should I know about osteoporosis? Osteoporosis is a condition in which bone destruction happens more quickly than new bone creation. After menopause, you may be at an increased risk for osteoporosis. To help prevent osteoporosis or the bone fractures that can happen because of osteoporosis, the following is recommended:  If you are 46-71 years old, get at least 1,000 mg of calcium and at least 600 mg of vitamin D per day.  If you are older than age 55 but younger than age 65, get at least 1,200 mg of calcium and at least 600 mg of vitamin D per day.  If you are older than age 54, get at least 1,200 mg of calcium and at least 800 mg of vitamin D per day.  Smoking and excessive alcohol intake increase the risk of osteoporosis. Eat foods that are rich in calcium and vitamin D, and do weight-bearing exercises several times each week as directed by your health care provider. What should I know about how menopause affects my mental health? Depression may occur at any age, but it is more common as you become older. Common symptoms of depression  include:  Low or sad mood.  Changes in sleep patterns.  Changes in appetite or eating patterns.  Feeling an overall lack of motivation or enjoyment of activities that you previously enjoyed.  Frequent crying spells.  Talk with your health care provider if you think that you are experiencing depression. What should I know about immunizations? It is important that you get and maintain your immunizations. These include:  Tetanus, diphtheria, and pertussis (Tdap) booster vaccine.  Influenza every year before the flu season begins.  Pneumonia vaccine.  Shingles vaccine.  Your health care provider may also recommend other immunizations. This information is not intended to replace advice given to you by your health care provider. Make sure you discuss any questions you have with your health care provider. Document Released: 03/06/2005  Document Revised: 08/02/2015 Document Reviewed: 10/16/2014 Elsevier Interactive Patient Education  2018 [employer]  

## 2017-03-29 NOTE — Progress Notes (Signed)
Subjective:     Maria Nunez is a 57 y.o. female and is here for a comprehensive physical exam. The patient reports no problems.  She has been having left flank pain occ. No dysuria or frequency.   Social History   Socioeconomic History  . Marital status: Divorced    Spouse name: Not on file  . Number of children: Not on file  . Years of education: Not on file  . Highest education level: Not on file  Social Needs  . Financial resource strain: Not on file  . Food insecurity - worry: Not on file  . Food insecurity - inability: Not on file  . Transportation needs - medical: Not on file  . Transportation needs - non-medical: Not on file  Occupational History  . Not on file  Tobacco Use  . Smoking status: Never Smoker  . Smokeless tobacco: Never Used  Substance and Sexual Activity  . Alcohol use: No    Alcohol/week: 0.0 oz  . Drug use: No  . Sexual activity: No    Partners: Male    Birth control/protection: Surgical    Comment: TAH/supracervical  Other Topics Concern  . Not on file  Social History Narrative  . Not on file   Health Maintenance  Topic Date Due  . INFLUENZA VACCINE  09/22/2017 (Originally 08/26/2016)  . PNEUMOCOCCAL POLYSACCHARIDE VACCINE (2) 04/18/2017  . HEMOGLOBIN A1C  04/29/2017  . OPHTHALMOLOGY EXAM  05/06/2017  . FOOT EXAM  06/12/2017  . MAMMOGRAM  07/14/2018  . PAP SMEAR  03/09/2020  . COLONOSCOPY  07/25/2023  . TETANUS/TDAP  04/02/2024  . Hepatitis C Screening  Completed  . HIV Screening  Completed    The following portions of the patient's history were reviewed and updated as appropriate: allergies, current medications, past family history, past medical history, past social history, past surgical history and problem list.  Review of Systems A comprehensive review of systems was negative.   Objective:    BP 140/63   Pulse 80   Ht 4\' 11"  (1.499 m)   Wt 165 lb (74.8 kg)   LMP 01/27/2000 (Approximate)   SpO2 95%   BMI 33.33 kg/m   General appearance: alert, cooperative and appears stated age Head: Normocephalic, without obvious abnormality, atraumatic Eyes: conj clear, EOMi, PEERLA Ears: normal TM's and external ear canals both ears Nose: Nares normal. Septum midline. Mucosa normal. No drainage or sinus tenderness. Throat: lips, mucosa, and tongue normal; teeth and gums normal Neck: no adenopathy, no carotid bruit, no JVD, supple, symmetrical, trachea midline and thyroid not enlarged, symmetric, no tenderness/mass/nodules Back: symmetric, no curvature. ROM normal. No CVA tenderness. Lungs: clear to auscultation bilaterally Breasts: normal appearance, no masses or tenderness Heart: regular rate and rhythm, S1, S2 normal, no murmur, click, rub or gallop Abdomen: soft, non-tender; bowel sounds normal; no masses,  no organomegaly Extremities: extremities normal, atraumatic, no cyanosis or edema Pulses: 2+ and symmetric Skin: Skin color, texture, turgor normal. No rashes or lesions Lymph nodes: Cervical adenopathy: nl and Supraclavicular adenopathy: nl Neurologic: Alert and oriented X 3, normal strength and tone. Normal symmetric reflexes. Normal coordination and gait    Assessment:    Healthy female exam.      Plan:     See After Visit Summary for Counseling Recommendations   Keep up a regular exercise program and make sure you are eating a healthy diet Try to eat 4 servings of dairy a day, or if you are lactose intolerant take a  calcium with vitamin D daily.  Your vaccines are up to date.  Due for labs today. Will call.   Left flank pain - will check UA, urine micro.    Info provided on Advanced Directives.

## 2017-03-30 LAB — COMPLETE METABOLIC PANEL WITH GFR
AG Ratio: 1.8 (calc) (ref 1.0–2.5)
ALT: 35 U/L — ABNORMAL HIGH (ref 6–29)
AST: 27 U/L (ref 10–35)
Albumin: 4.4 g/dL (ref 3.6–5.1)
Alkaline phosphatase (APISO): 66 U/L (ref 33–130)
BUN: 13 mg/dL (ref 7–25)
CO2: 29 mmol/L (ref 20–32)
Calcium: 10.8 mg/dL — ABNORMAL HIGH (ref 8.6–10.4)
Chloride: 101 mmol/L (ref 98–110)
Creat: 0.61 mg/dL (ref 0.50–1.05)
GFR, Est African American: 117 mL/min/{1.73_m2} (ref >=60)
GFR, Est Non African American: 101 mL/min/{1.73_m2} (ref >=60)
Globulin: 2.4 g/dL (calc) (ref 1.9–3.7)
Glucose, Bld: 121 mg/dL — ABNORMAL HIGH (ref 65–99)
Potassium: 3.8 mmol/L (ref 3.5–5.3)
Sodium: 142 mmol/L (ref 135–146)
Total Bilirubin: 0.4 mg/dL (ref 0.2–1.2)
Total Protein: 6.8 g/dL (ref 6.1–8.1)

## 2017-03-30 LAB — MICROALBUMIN / CREATININE URINE RATIO
Creatinine, Urine: 67 mg/dL (ref 20–275)
Microalb Creat Ratio: 6 mcg/mg creat (ref <30)
Microalb, Ur: 0.4 mg/dL

## 2017-03-30 LAB — URINALYSIS, ROUTINE W REFLEX MICROSCOPIC
Bacteria, UA: NONE SEEN /HPF
Bilirubin Urine: NEGATIVE
Glucose, UA: NEGATIVE
Hgb urine dipstick: NEGATIVE
Hyaline Cast: NONE SEEN /LPF
Ketones, ur: NEGATIVE
Nitrite: NEGATIVE
Protein, ur: NEGATIVE
RBC / HPF: NONE SEEN /HPF (ref 0–2)
Specific Gravity, Urine: 1.012 (ref 1.001–1.03)
pH: 5.5 (ref 5.0–8.0)

## 2017-03-30 LAB — HEMOGLOBIN A1C
Hgb A1c MFr Bld: 6.8 % of total Hgb — ABNORMAL HIGH (ref <5.7)
Mean Plasma Glucose: 148 (calc)
eAG (mmol/L): 8.2 (calc)

## 2017-03-30 LAB — LIPID PANEL
Cholesterol: 180 mg/dL (ref <200)
HDL: 53 mg/dL (ref >50)
LDL Cholesterol (Calc): 97 mg/dL (calc)
Non-HDL Cholesterol (Calc): 127 mg/dL (calc) (ref <130)
Total CHOL/HDL Ratio: 3.4 (calc) (ref <5.0)
Triglycerides: 205 mg/dL — ABNORMAL HIGH (ref <150)

## 2017-03-30 LAB — TSH: TSH: 0.94 mIU/L (ref 0.40–4.50)

## 2017-04-19 ENCOUNTER — Other Ambulatory Visit: Payer: Self-pay | Admitting: Nurse Practitioner

## 2017-04-21 ENCOUNTER — Other Ambulatory Visit: Payer: Self-pay | Admitting: *Deleted

## 2017-04-21 MED ORDER — FLUTICASONE PROPIONATE 50 MCG/ACT NA SUSP: 2.0000 | Freq: Every day | NASAL | 6 refills | Status: DC

## 2017-04-21 MED FILL — FLUTICASONE PROP 50 MCG SPR: 50 | 30 days supply | Qty: 16 | Fill #0

## 2017-05-03 LAB — HM DIABETES EYE EXAM

## 2017-05-31 ENCOUNTER — Other Ambulatory Visit: Payer: Self-pay | Admitting: Family Medicine

## 2017-05-31 DIAGNOSIS — Z1231 Encounter for screening mammogram for malignant neoplasm of breast: Secondary | ICD-10-CM

## 2017-06-08 ENCOUNTER — Telehealth: Payer: Self-pay | Admitting: Family Medicine

## 2017-06-08 NOTE — Telephone Encounter (Signed)
The request has been approved. The authorization is effective for a maximum of 12 fills from 06/03/2017 to 06/03/2018, as long as the member is enrolled in their current health plan. The request was reviewed and approved by a licensed clinical pharmacist. A written notification letter will follow with additional details.

## 2017-06-24 ENCOUNTER — Other Ambulatory Visit: Payer: Self-pay | Admitting: Family Medicine

## 2017-06-24 MED FILL — LOSARTAN-HCTZ 100-25 MG TAB: 100-25 | 90 days supply | Qty: 90 | Fill #0

## 2017-06-24 MED FILL — metFORMIN HCL 1000 MG TABS: 1000 | 90 days supply | Qty: 180 | Fill #0

## 2017-06-25 ENCOUNTER — Ambulatory Visit (INDEPENDENT_AMBULATORY_CARE_PROVIDER_SITE_OTHER): Payer: 59

## 2017-06-25 ENCOUNTER — Other Ambulatory Visit: Payer: Self-pay | Admitting: Family Medicine

## 2017-06-25 ENCOUNTER — Ambulatory Visit (INDEPENDENT_AMBULATORY_CARE_PROVIDER_SITE_OTHER): Payer: 59 | Admitting: Sports Medicine

## 2017-06-25 DIAGNOSIS — M1712 Unilateral primary osteoarthritis, left knee: Secondary | ICD-10-CM

## 2017-06-25 DIAGNOSIS — M1711 Unilateral primary osteoarthritis, right knee: Secondary | ICD-10-CM | POA: Diagnosis not present

## 2017-06-25 DIAGNOSIS — M25462 Effusion, left knee: Secondary | ICD-10-CM | POA: Diagnosis not present

## 2017-06-25 DIAGNOSIS — M1811 Unilateral primary osteoarthritis of first carpometacarpal joint, right hand: Secondary | ICD-10-CM

## 2017-06-25 DIAGNOSIS — M25561 Pain in right knee: Secondary | ICD-10-CM

## 2017-06-25 MED ORDER — DICLOFENAC SODIUM 2 % TD SOLN: 2.0000 | Freq: Two times a day (BID) | TRANSDERMAL | 11 refills | Status: DC

## 2017-06-25 MED FILL — PRAVASTATIN NA 40 MG TAB: 40 | 90 days supply | Qty: 90 | Fill #0

## 2017-06-25 NOTE — Progress Notes (Signed)
Subjective:    CC: Left knee pain, right thumb pain  HPI: CMC arthritis: Previous injection sometime in 2017, recurrence of pain, moderate, persistent, localized without radiation.  Left knee pain: Present since 2009, gelling, medial joint line, no mechanical symptoms, moderate, persistent without radiation.  Oral NSAIDs have been ineffective.  I reviewed the past medical history, family history, social history, surgical history, and allergies today and no changes were needed.  Please see the problem list section below in epic for further details.  Past Medical History: Past Medical History:  Diagnosis Date  . Abnormal ultrasound of ovary 2016   Ultrasound showing bilateral ovaries in contact with one another.  Asymptomatic.  [name] Allergy   . Diabetes mellitus    borderline  . Fibroid   . Heart murmur   . Hidradenitis   . Hypertension    Past Surgical History: Past Surgical History:  Procedure Laterality Date  . ABDOMINAL HYSTERECTOMY     supracervical  . BREAST EXCISIONAL BIOPSY    . BREAST SURGERY     cysts removed right breast  . CARPAL TUNNEL RELEASE Right 01/08/2017   Procedure: CARPAL TUNNEL RELEASE, POSSIBLE RIGHT THUMB CARPOMETACARPAL INJECTION;  Surgeon: Roseanne Kaufman, MD;  Location: [place];  Service: Orthopedics;  Laterality: Right;  60 MINS  . CESAREAN SECTION    . INGUINAL HIDRADENITIS EXCISION    . LEEP  05/04/13   HPV change   Social History: Social History   Socioeconomic History  . Marital status: Divorced    Spouse name: Not on file  . Number of children: Not on file  . Years of education: Not on file  . Highest education level: Not on file  Occupational History  . Not on file  Social Needs  . Financial resource strain: Not on file  . Food insecurity:    Worry: Not on file    Inability: Not on file  . Transportation needs:    Medical: Not on file    Non-medical: Not on file  Tobacco Use  . Smoking status: Never Smoker  .  Smokeless tobacco: Never Used  Substance and Sexual Activity  . Alcohol use: No    Alcohol/week: 0.0 oz  . Drug use: No  . Sexual activity: Never    Partners: Male    Birth control/protection: Surgical    Comment: TAH/supracervical  Lifestyle  . Physical activity:    Days per week: Not on file    Minutes per session: Not on file  . Stress: Not on file  Relationships  . Social connections:    Talks on phone: Not on file    Gets together: Not on file    Attends religious service: Not on file    Active member of club or organization: Not on file    Attends meetings of clubs or organizations: Not on file    Relationship status: Not on file  Other Topics Concern  . Not on file  Social History Narrative  . Not on file   Family History: Family History  Problem Relation Age of Onset  . Asthma Mother   . COPD Mother   . Diabetes Mother   . Hypertension Mother   . Diabetes Father   . Hypertension Father   . Breast cancer Maternal Aunt   . Breast cancer Paternal Aunt    Allergies: Allergies  Allergen Reactions  . Sulfonamide Derivatives Hives    REACTION: hives  . Xigduo Xr [Dapagliflozin-Metformin Hcl Er] Hives  .  Hycodan [Hydrocodone-Homatropine] Itching  . Cefaclor   . Cephalexin   . Penicillins   . Tetracycline   . Victoza [Liraglutide] Other (See Comments)    Back pain  . Januvia [Sitagliptin] Nausea Only    Back pain   Medications: See med rec.  Review of Systems: No fevers, chills, night sweats, weight loss, chest pain, or shortness of breath.   Objective:    General: Well Developed, well nourished, and in no acute distress.  Neuro: Alert and oriented x3, extra-ocular muscles intact, sensation grossly intact.  HEENT: Normocephalic, atraumatic, pupils equal round reactive to light, neck supple, no masses, no lymphadenopathy, thyroid nonpalpable.  Skin: Warm and dry, no rashes. Cardiac: Regular rate and rhythm, no murmurs rubs or gallops, no lower extremity  edema.  Respiratory: Clear to auscultation bilaterally. Not using accessory muscles, speaking in full sentences. Left knee: Tender to palpation at the medial joint line, minimal fluid wave ROM normal in flexion and extension and lower leg rotation. Ligaments with solid consistent endpoints including ACL, PCL, LCL, MCL. Negative Mcmurray's and provocative meniscal tests. Non painful patellar compression. Patellar and quadriceps tendons unremarkable. Hamstring and quadriceps strength is normal.  Procedure: Real-time Ultrasound Guided Injection of left knee Device: GE Logiq E  Verbal informed consent obtained.  Time-out conducted.  Noted no overlying erythema, induration, or other signs of local infection.  Skin prepped in a sterile fashion.  Local anesthesia: Topical Ethyl chloride.  With sterile technique and under real time ultrasound guidance: 25-gauge needle advanced into the suprapatellar recess, 1 cc Kenalog 40, 2 cc lidocaine, 2 cc bupivacaine injected easily Completed without difficulty  Pain immediately resolved suggesting accurate placement of the medication.  Advised to call if fevers/chills, erythema, induration, drainage, or persistent bleeding.  Images permanently stored and available for review in the ultrasound unit.  Impression: Technically successful ultrasound guided injection.  Procedure: Real-time Ultrasound Guided Injection of right trapeziometacarpal joint Device: GE Logiq E  Verbal informed consent obtained.  Time-out conducted.  Noted no overlying erythema, induration, or other signs of local infection.  Skin prepped in a sterile fashion.  Local anesthesia: Topical Ethyl chloride.  With sterile technique and under real time ultrasound guidance: 1/2 cc kenalog 40, 1/2 cc lidocaine injected, I did get some resistance at about 1/2 cc total injectate, and I placed the rest of the medication periarticular. Completed without difficulty  Pain immediately resolved  suggesting accurate placement of the medication.  Advised to call if fevers/chills, erythema, induration, drainage, or persistent bleeding.  Images permanently stored and available for review in the ultrasound unit.  Impression: Technically successful ultrasound guided injection.  Impression and Recommendations:    Primary osteoarthritis of left knee Failure of oral analgesics and NSAIDs. Injection as above, updated x-rays, rehab exercises. Topical Pennsaid (diclofenac 2% topical). Return to see me in 1 month.  Localized primary osteoarthritis of carpometacarpal joint of right thumb Previous right first [place] injection was in 2017 some time, repeating today. ___________________________________________ Gwen Her. Dianah Field, M.D., ABFM., CAQSM. Primary Care and [place] Instructor of [place] of [facility] of Medicine

## 2017-06-25 NOTE — Assessment & Plan Note (Signed)
Failure of oral analgesics and NSAIDs. Injection as above, updated x-rays, rehab exercises. Topical Pennsaid (diclofenac 2% topical). Return to see me in 1 month.

## 2017-06-25 NOTE — Assessment & Plan Note (Signed)
Previous right first [place] injection was in 2017 some time, repeating today.

## 2017-06-26 DIAGNOSIS — M545 Low back pain: Secondary | ICD-10-CM | POA: Diagnosis not present

## 2017-06-26 DIAGNOSIS — M542 Cervicalgia: Secondary | ICD-10-CM | POA: Diagnosis not present

## 2017-06-26 DIAGNOSIS — M62830 Muscle spasm of back: Secondary | ICD-10-CM | POA: Diagnosis not present

## 2017-06-26 DIAGNOSIS — M5412 Radiculopathy, cervical region: Secondary | ICD-10-CM | POA: Diagnosis not present

## 2017-06-28 ENCOUNTER — Other Ambulatory Visit: Payer: Self-pay | Admitting: Family Medicine

## 2017-06-28 MED FILL — ACCU-CHEK GUIDE STRP: 50 days supply | Qty: 100 | Fill #0

## 2017-06-29 ENCOUNTER — Ambulatory Visit (INDEPENDENT_AMBULATORY_CARE_PROVIDER_SITE_OTHER): Payer: 59 | Admitting: Family Medicine

## 2017-06-29 ENCOUNTER — Encounter: Payer: Self-pay | Admitting: Family Medicine

## 2017-06-29 VITALS — BP 143/68 | HR 77 | Ht 59.0 in | Wt 166.0 lb

## 2017-06-29 DIAGNOSIS — I10 Essential (primary) hypertension: Secondary | ICD-10-CM | POA: Diagnosis not present

## 2017-06-29 DIAGNOSIS — E119 Type 2 diabetes mellitus without complications: Secondary | ICD-10-CM | POA: Diagnosis not present

## 2017-06-29 LAB — POCT GLYCOSYLATED HEMOGLOBIN (HGB A1C): Hemoglobin A1C: 6.6 % — AB (ref 4.0–5.6)

## 2017-06-29 MED ORDER — ACCU-CHEK FASTCLIX LANCETS MISC: 11 refills | Status: DC

## 2017-06-29 MED ORDER — AMLODIPINE BESYLATE 5 MG PO TABS: 5.0000 mg | ORAL_TABLET | Freq: Every day | ORAL | 1 refills | Status: DC

## 2017-06-29 MED FILL — ACCU-CHEK FASTCLIX LANCETS: 90 days supply | Qty: 408 | Fill #0

## 2017-06-29 MED FILL — AMLODIPINE BESYLATE 5 MG TA: 5 | 90 days supply | Qty: 90 | Fill #0

## 2017-06-29 NOTE — Patient Instructions (Signed)
Follow up in 2 weeks for a nurse visit for BP check on new medication.

## 2017-06-29 NOTE — Addendum Note (Signed)
Addended by: Teddy Spike on: 06/29/2017 09:42 AM   Modules accepted: Orders

## 2017-06-29 NOTE — Progress Notes (Signed)
   Subjective:    Patient ID: Maria Nunez, female    DOB: [birth year], 57 y.o.   MRN: [id number]  HPI Diabetes - no hypoglycemic events. No wounds or sores that are not healing well. No increased thirst or urination. Checking glucose at home. Taking medications as prescribed without any side effects.  She has recently had injections last Friday for 2 different joints her knee and her hand.  She says it really shot her blood sugars up into the 200s which is very rare for her.  She says she is been trying to eat very healthy and really only splurged on memorial day.  She did have her eye exam done at my eye doctor in April.  Hypertension- Pt denies chest pain, SOB, dizziness, or heart palpitations.  Taking meds as directed w/o problems.  Denies medication side effects.      Review of Systems     Objective:   Physical Exam  Constitutional: She is oriented to person, place, and time. She appears well-developed and well-nourished.  HENT:  Head: Normocephalic and atraumatic.  Cardiovascular: Normal rate, regular rhythm and normal heart sounds.  Pulmonary/Chest: Effort normal and breath sounds normal.  Neurological: She is alert and oriented to person, place, and time.  Skin: Skin is warm and dry.  Psychiatric: She has a normal mood and affect. Her behavior is normal.       Assessment & Plan:  DM -well controlled.  Heme globin A1c is 6.6 today.  Continue current regimen.   HTN - BP uncontrolled. Has been high the last couple of times she has been here. Will add low dose amlodipine to her regimen.

## 2017-06-30 DIAGNOSIS — M5412 Radiculopathy, cervical region: Secondary | ICD-10-CM | POA: Diagnosis not present

## 2017-06-30 DIAGNOSIS — M62830 Muscle spasm of back: Secondary | ICD-10-CM | POA: Diagnosis not present

## 2017-06-30 DIAGNOSIS — M542 Cervicalgia: Secondary | ICD-10-CM | POA: Diagnosis not present

## 2017-06-30 DIAGNOSIS — M545 Low back pain: Secondary | ICD-10-CM | POA: Diagnosis not present

## 2017-07-08 ENCOUNTER — Telehealth: Payer: Self-pay

## 2017-07-08 DIAGNOSIS — M545 Low back pain: Secondary | ICD-10-CM | POA: Diagnosis not present

## 2017-07-08 DIAGNOSIS — M62830 Muscle spasm of back: Secondary | ICD-10-CM | POA: Diagnosis not present

## 2017-07-08 DIAGNOSIS — M542 Cervicalgia: Secondary | ICD-10-CM | POA: Diagnosis not present

## 2017-07-08 DIAGNOSIS — M5412 Radiculopathy, cervical region: Secondary | ICD-10-CM | POA: Diagnosis not present

## 2017-07-08 NOTE — Telephone Encounter (Signed)
Pt called stating that since her office visit last week on 06-29-17, she was started on low dose amlodipine in addition to her regular BP meds.   Pt states she has been taking it and her blood pressure has been running low, her reading last night was 95/63.  Pt states that she is not having any SX, she is just wanting to know how low is Dr Madilyn Fireman okay with it getting?   Please advise.

## 2017-07-08 NOTE — Telephone Encounter (Signed)
Pt advised. She will split pills in half and monitor BP for the next week and call with update. Pt will be pushing fluids and call if any further low readings after cutting medication in half.

## 2017-07-08 NOTE — Telephone Encounter (Signed)
Pressure is too low.  Cut amlodipine in half and take half tab daily.  It does come in a 2.5 mg prescription but since she has so much of it I want to use that up first.  Check pressures again in a week or so and give [place] a call and let us know how they are doing.  Make sure drinking plenty of fluid to.

## 2017-07-19 ENCOUNTER — Ambulatory Visit
Admission: RE | Admit: 2017-07-19 | Discharge: 2017-07-19 | Disposition: A | Discharge status: Home / Self Care | Payer: 59 | Source: Ambulatory Visit | Attending: Family Medicine | Admitting: Family Medicine

## 2017-07-19 DIAGNOSIS — Z1231 Encounter for screening mammogram for malignant neoplasm of breast: Secondary | ICD-10-CM | POA: Diagnosis not present

## 2017-07-22 ENCOUNTER — Encounter: Payer: Self-pay | Admitting: Family Medicine

## 2017-07-22 DIAGNOSIS — M62830 Muscle spasm of back: Secondary | ICD-10-CM | POA: Diagnosis not present

## 2017-07-22 DIAGNOSIS — M545 Low back pain: Secondary | ICD-10-CM | POA: Diagnosis not present

## 2017-07-22 DIAGNOSIS — M542 Cervicalgia: Secondary | ICD-10-CM | POA: Diagnosis not present

## 2017-07-22 DIAGNOSIS — M5412 Radiculopathy, cervical region: Secondary | ICD-10-CM | POA: Diagnosis not present

## 2017-07-23 ENCOUNTER — Ambulatory Visit (INDEPENDENT_AMBULATORY_CARE_PROVIDER_SITE_OTHER): Payer: 59 | Admitting: Sports Medicine

## 2017-07-23 ENCOUNTER — Encounter: Payer: Self-pay | Admitting: Sports Medicine

## 2017-07-23 DIAGNOSIS — M1811 Unilateral primary osteoarthritis of first carpometacarpal joint, right hand: Secondary | ICD-10-CM | POA: Diagnosis not present

## 2017-07-23 DIAGNOSIS — M1712 Unilateral primary osteoarthritis, left knee: Secondary | ICD-10-CM | POA: Diagnosis not present

## 2017-07-23 NOTE — Progress Notes (Signed)
Subjective:    CC: Follow-up  HPI: Right CMC arthritis and left knee osteoarthritis: Both structures are now pain-free with injections and Pennsaid (diclofenac 2% topical).  I reviewed the past medical history, family history, social history, surgical history, and allergies today and no changes were needed.  Please see the problem list section below in epic for further details.  Past Medical History: Past Medical History:  Diagnosis Date  . Abnormal ultrasound of ovary 2016   Ultrasound showing bilateral ovaries in contact with one another.  Asymptomatic.  [name] Allergy   . Diabetes mellitus    borderline  . Fibroid   . Heart murmur   . Hidradenitis   . Hypertension    Past Surgical History: Past Surgical History:  Procedure Laterality Date  . ABDOMINAL HYSTERECTOMY     supracervical  . BREAST EXCISIONAL BIOPSY    . BREAST SURGERY     cysts removed right breast  . CARPAL TUNNEL RELEASE Right 01/08/2017   Procedure: CARPAL TUNNEL RELEASE, POSSIBLE RIGHT THUMB CARPOMETACARPAL INJECTION;  Surgeon: Roseanne Kaufman, MD;  Location: [place];  Service: Orthopedics;  Laterality: Right;  60 MINS  . CESAREAN SECTION    . INGUINAL HIDRADENITIS EXCISION    . LEEP  05/04/13   HPV change   Social History: Social History   Socioeconomic History  . Marital status: Divorced    Spouse name: Not on file  . Number of children: Not on file  . Years of education: Not on file  . Highest education level: Not on file  Occupational History  . Not on file  Social Needs  . Financial resource strain: Not on file  . Food insecurity:    Worry: Not on file    Inability: Not on file  . Transportation needs:    Medical: Not on file    Non-medical: Not on file  Tobacco Use  . Smoking status: Never Smoker  . Smokeless tobacco: Never Used  Substance and Sexual Activity  . Alcohol use: No    Alcohol/week: 0.0 oz  . Drug use: No  . Sexual activity: Never    Partners: Male   Birth control/protection: Surgical    Comment: TAH/supracervical  Lifestyle  . Physical activity:    Days per week: Not on file    Minutes per session: Not on file  . Stress: Not on file  Relationships  . Social connections:    Talks on phone: Not on file    Gets together: Not on file    Attends religious service: Not on file    Active member of club or organization: Not on file    Attends meetings of clubs or organizations: Not on file    Relationship status: Not on file  Other Topics Concern  . Not on file  Social History Narrative  . Not on file   Family History: Family History  Problem Relation Age of Onset  . Asthma Mother   . COPD Mother   . Diabetes Mother   . Hypertension Mother   . Diabetes Father   . Hypertension Father   . Breast cancer Maternal Aunt   . Breast cancer Paternal Aunt    Allergies: Allergies  Allergen Reactions  . Sulfonamide Derivatives Hives    REACTION: hives  . Xigduo Xr [Dapagliflozin-Metformin Hcl Er] Hives  . Hycodan [Hydrocodone-Homatropine] Itching  . Cefaclor   . Cephalexin   . Penicillins   . Tetracycline   . Victoza [Liraglutide] Other (See Comments)  Back pain  . Januvia [Sitagliptin] Nausea Only    Back pain   Medications: See med rec.  Review of Systems: No fevers, chills, night sweats, weight loss, chest pain, or shortness of breath.   Objective:    General: Well Developed, well nourished, and in no acute distress.  Neuro: Alert and oriented x3, extra-ocular muscles intact, sensation grossly intact.  HEENT: Normocephalic, atraumatic, pupils equal round reactive to light, neck supple, no masses, no lymphadenopathy, thyroid nonpalpable.  Skin: Warm and dry, no rashes. Cardiac: Regular rate and rhythm, no murmurs rubs or gallops, no lower extremity edema.  Respiratory: Clear to auscultation bilaterally. Not using accessory muscles, speaking in full sentences.  Impression and Recommendations:    Primary  osteoarthritis of left knee Resolved after injection. Pennsaid (diclofenac 2% topical) highly effective.  Localized primary osteoarthritis of carpometacarpal joint of right thumb Previous injection was in 2017, most recent injection last month is provided good relief, no further pain, return as needed. ___________________________________________ Gwen Her. Dianah Field, M.D., ABFM., CAQSM. Primary Care and [place] Instructor of [place] of [facility] of Medicine

## 2017-07-23 NOTE — Assessment & Plan Note (Signed)
Previous injection was in 2017, most recent injection last month is provided good relief, no further pain, return as needed.

## 2017-07-23 NOTE — Assessment & Plan Note (Signed)
Resolved after injection. Pennsaid (diclofenac 2% topical) highly effective.

## 2017-07-26 ENCOUNTER — Ambulatory Visit (INDEPENDENT_AMBULATORY_CARE_PROVIDER_SITE_OTHER): Payer: 59 | Admitting: Family Medicine

## 2017-07-26 ENCOUNTER — Encounter: Payer: Self-pay | Admitting: Family Medicine

## 2017-07-26 VITALS — BP 142/62 | HR 79 | Ht 59.0 in | Wt 168.0 lb

## 2017-07-26 DIAGNOSIS — M6208 Separation of muscle (nontraumatic), other site: Secondary | ICD-10-CM | POA: Diagnosis not present

## 2017-07-26 DIAGNOSIS — R109 Unspecified abdominal pain: Secondary | ICD-10-CM

## 2017-07-26 NOTE — Patient Instructions (Signed)
BP : top number > than 110 Bottom number needs to be 85 or less.

## 2017-07-26 NOTE — Progress Notes (Signed)
   Subjective:    Patient ID: Maria Nunez, female    DOB: 1960/02/03, 57 y.o.   MRN: [id number]  HPI 57 year old female comes in today complaining of a abdominal wall hernia.  When she was pregnant with 1 of her kids she was told that she had an abdominal wall hernia more midline.  She put out a bone was 35 bags of mulch and has been sore ever since.  Sore enough that is been difficult for her to sleep on her stomach.  She is also been having some left flank pain which for which she is been seeing a Restaurant manager, fast food.  She has not had any blood in the stool or urine.  This also started around the time that she put the mulch out.  She has had some epigastric discomfort.  But she is also been expensing a little bit more reflux than usual over the last month.   Review of Systems     Objective:   Physical Exam  Constitutional: She is oriented to person, place, and time. She appears well-developed and well-nourished.  HENT:  Head: Normocephalic and atraumatic.  Eyes: Conjunctivae and EOM are normal.  Cardiovascular: Normal rate.  Pulmonary/Chest: Effort normal.  Abdominal: Soft. She exhibits no distension. There is no tenderness. There is no guarding.  When she Valsalva's there is a slight bulge of the mid abdomen.  Unable to palpate a distinct defect in the wall or around the umbilicus.  Musculoskeletal:  Nontender over the mid thoracic spine.  Nontender over the paraspinous muscles.  Neurological: She is alert and oriented to person, place, and time.  Skin: Skin is dry. No pallor.  Psychiatric: She has a normal mood and affect. Her behavior is normal.  Vitals reviewed.         Assessment & Plan:  Abdominal wall weakness-I think what she is describing is more consistent with a diastases rectus.  Or some weakness in the muscle walls.  I am unable to palpate a distinct wall defect indicating a hernia that would benefit from repair.  We discussed options including further imaging versus  a consultation with general surgery just to see what they recommend.  She will give it a month and see if the soreness improves.  If not then she will give [place] a call back.  Left flank pain-seeing a chiropractor.  She says she does not usually do well with medication such as muscle relaxers.  If she starts to notice any blood in her urine or is not improving then consider evaluating for renal problems.  Hypertension-she is to do blood pressures at home look great with systolics running in the 176H with diastolics in running in the upper 80s.  She will go back up to whole tab on her blood pressure pill.

## 2017-08-19 ENCOUNTER — Ambulatory Visit: Payer: 59 | Admitting: Family Medicine

## 2017-08-23 ENCOUNTER — Ambulatory Visit: Payer: 59 | Admitting: Obstetrics and Gynecology

## 2017-08-23 ENCOUNTER — Telehealth: Payer: Self-pay | Admitting: Obstetrics and Gynecology

## 2017-08-23 ENCOUNTER — Encounter: Payer: Self-pay | Admitting: Obstetrics and Gynecology

## 2017-08-23 VITALS — BP 130/82 | HR 62 | Resp 14 | Ht <= 58 in | Wt 169.0 lb

## 2017-08-23 DIAGNOSIS — M549 Dorsalgia, unspecified: Secondary | ICD-10-CM

## 2017-08-23 DIAGNOSIS — N764 Abscess of vulva: Secondary | ICD-10-CM | POA: Diagnosis not present

## 2017-08-23 DIAGNOSIS — N762 Acute vulvitis: Secondary | ICD-10-CM

## 2017-08-23 LAB — POCT URINALYSIS DIPSTICK
Bilirubin, UA: NEGATIVE
Blood, UA: NEGATIVE
Glucose, UA: NEGATIVE
Ketones, UA: NEGATIVE
Leukocytes, UA: NEGATIVE
Nitrite, UA: NEGATIVE
Protein, UA: NEGATIVE
Urobilinogen, UA: 0.2 E.U./dL
pH, UA: 5 (ref 5.0–8.0)

## 2017-08-23 MED ORDER — DOXYCYCLINE HYCLATE 100 MG PO CAPS: 100.0000 mg | ORAL_CAPSULE | Freq: Two times a day (BID) | ORAL | 0 refills | Status: DC

## 2017-08-23 MED FILL — DOXYCYCLINE HYCLATE 100 MG: 100 | 7 days supply | Qty: 14 | Fill #0

## 2017-08-23 NOTE — Telephone Encounter (Signed)
Patient has hidradenitis and is having a flare up.

## 2017-08-23 NOTE — Telephone Encounter (Signed)
Patient reports hydratenitis in groin area. Increased and more painful. No fevers or streaking of redness on skin.  Hx of DM. Office visit with Dr. Quincy Simmonds today.  Encounter closed.

## 2017-08-23 NOTE — Progress Notes (Signed)
GYNECOLOGY  VISIT   HPI: 57 y.o.   Divorced  Caucasian  female   [phone] with Patient's last menstrual period was 01/27/2000 (approximate).   here for  chronic lower left back pain. A clean catch urine was obtained.  Urine dip negative.  No motor weakness or numbness in her legs. Has seen chiropractor, and pain is not improving.   Also here for hidradenitis per patient. Notes a lump in her groin and getting bigger suddenly this am.  Not draining.  Used Bacitracin.  Had surgery for this a long time ago.  No fevers.   States she can take Doxycycline without a problems.   GYNECOLOGIC HISTORY: Patient's last menstrual period was 01/27/2000 (approximate). Contraception:  Histerectomy Menopausal hormone therapy:   Last mammogram:   Last pap         OB History    Gravida  3   Para  2   Term  2   Preterm      AB  1   Living  2     SAB  1   TAB      Ectopic      Multiple      Live Births                 Patient Active Problem List   Diagnosis Date Noted  . Bursitis of left shoulder 01/24/2016  . GAD (generalized anxiety disorder) 04/02/2015  . Localized primary osteoarthritis of carpometacarpal joint of right thumb 03/15/2015  . Carpal tunnel syndrome, bilateral 04/05/2013  . LGSIL (low grade squamous intraepithelial dysplasia) 06/16/2012  . Diabetes mellitus without complication ([place]) 18/29/9371  . PANIC ATTACK 09/07/2008  . RESTLESS LEG SYNDROME 04/02/2008  . POLYARTHRITIS 04/02/2008  . Primary osteoarthritis of left knee 01/02/2008  . HOT FLASHES 08/04/2006  . HYPERCHOLESTEROLEMIA 11/03/2005  . HYPERTENSION, BENIGN SYSTEMIC 11/03/2005    Past Medical History:  Diagnosis Date  . Abnormal ultrasound of ovary 2016   Ultrasound showing bilateral ovaries in contact with one another.  Asymptomatic.  [name] Allergy   . Diabetes mellitus    borderline  . Fibroid   . Heart murmur   . Hidradenitis   . Hypertension     Past Surgical History:  Procedure  Laterality Date  . ABDOMINAL HYSTERECTOMY     supracervical  . BREAST EXCISIONAL BIOPSY    . BREAST SURGERY     cysts removed right breast  . CARPAL TUNNEL RELEASE Right 01/08/2017   Procedure: CARPAL TUNNEL RELEASE, POSSIBLE RIGHT THUMB CARPOMETACARPAL INJECTION;  Surgeon: Roseanne Kaufman, MD;  Location: [facility];  Service: Orthopedics;  Laterality: Right;  60 MINS  . CESAREAN SECTION    . INGUINAL HIDRADENITIS EXCISION    . LEEP  05/04/13   HPV change    Current Outpatient Medications  Medication Sig Dispense Refill  . ACCU-CHEK FASTCLIX LANCETS MISC For use when checking blood sugars up to four times daily. Dx: E11.9 4 each 11  . ACCU-CHEK GUIDE test strip USE TO TEST BLOOD SUGAR TWICE DAILY 100 each 12  . aspirin 81 MG tablet Take 81 mg by mouth daily.      . Blood Glucose Monitoring Suppl (FREESTYLE LITE) DEVI   0  . Cyanocobalamin (B-12) 500 MCG TABS Take 1 tablet by mouth daily.    . fluticasone (FLONASE) 50 MCG/ACT nasal spray Place 2 sprays into both nostrils daily. 16 g 6  . FOLIC ACID PO Take 696 mcg by mouth daily.     [name]  Lancet Devices (ACCU-CHEK SOFTCLIX) lancets Use as instructed 1 each 11  . losartan-hydrochlorothiazide (HYZAAR) 100-25 MG tablet TAKE 1 TABLET BY MOUTH DAILY. 90 tablet PRN  . metFORMIN (GLUCOPHAGE) 1000 MG tablet TAKE 1 TABLET BY MOUTH 2 TIMES DAILY WITH A MEAL. 180 tablet 3  . Multiple Vitamin (MULTIVITAMIN) tablet Take 1 tablet by mouth daily.      . Omega-3 Fatty Acids (FISH OIL) 1000 MG CAPS Take 1,000 mg by mouth daily.     . pravastatin (PRAVACHOL) 40 MG tablet TAKE 1 TABLET BY MOUTH ONCE DAILY 90 tablet 3  . pyridOXINE (VITAMIN B-6) 100 MG tablet Take 100 mg by mouth daily.    . Zinc Sulfate (ZINC 15 PO) Take by mouth.      . doxycycline (VIBRAMYCIN) 100 MG capsule Take 1 capsule (100 mg total) by mouth 2 (two) times daily. Take for 7 days.  Take with food as can cause GI distress. 14 capsule 0   No current facility-administered  medications for this visit.      ALLERGIES: Sulfonamide derivatives; Xigduo xr [dapagliflozin-metformin hcl er]; Hycodan [hydrocodone-homatropine]; Cefaclor; Cephalexin; Penicillins; Tetracycline; Victoza [liraglutide]; and Januvia [sitagliptin]  Family History  Problem Relation Age of Onset  . Asthma Mother   . COPD Mother   . Diabetes Mother   . Hypertension Mother   . Diabetes Father   . Hypertension Father   . Breast cancer Maternal Aunt   . Breast cancer Paternal Aunt     Social History   Socioeconomic History  . Marital status: Divorced    Spouse name: Not on file  . Number of children: Not on file  . Years of education: Not on file  . Highest education level: Not on file  Occupational History  . Not on file  Social Needs  . Financial resource strain: Not on file  . Food insecurity:    Worry: Not on file    Inability: Not on file  . Transportation needs:    Medical: Not on file    Non-medical: Not on file  Tobacco Use  . Smoking status: Never Smoker  . Smokeless tobacco: Never Used  Substance and Sexual Activity  . Alcohol use: No    Alcohol/week: 0.0 oz  . Drug use: No  . Sexual activity: Never    Partners: Male    Birth control/protection: Surgical    Comment: TAH/supracervical  Lifestyle  . Physical activity:    Days per week: Not on file    Minutes per session: Not on file  . Stress: Not on file  Relationships  . Social connections:    Talks on phone: Not on file    Gets together: Not on file    Attends religious service: Not on file    Active member of club or organization: Not on file    Attends meetings of clubs or organizations: Not on file    Relationship status: Not on file  . Intimate partner violence:    Fear of current or ex partner: Not on file    Emotionally abused: Not on file    Physically abused: Not on file    Forced sexual activity: Not on file  Other Topics Concern  . Not on file  Social History Narrative  . Not on file     Review of Systems  Genitourinary:       Vulvar lump  All other systems reviewed and are negative.   PHYSICAL EXAMINATION:    BP 130/82 (BP Location:  Right Arm, Patient Position: Sitting, Cuff Size: Normal)   Pulse 62   Resp 14   Ht 4' (1.219 m)   Wt 169 lb (76.7 kg)   LMP 01/27/2000 (Approximate)   BMI 51.57 kg/m     General appearance: alert, cooperative and appears stated age   Pelvic: External genitalia:  Left labia majora with 3 cm cystic tender mass.  Skin with erythema and induration.  Small area of breakdown of skin with pus coming to a head.              Urethra:  normal appearing urethra with no masses, tenderness or lesions           Vulvar abscess I and D. Consent for procedure.  Sterile prep with betadine.  Local 1% lidocaine - lot [id number], exp 1/23.  Incision made with scalpel. Small amount of pus noted.  Wound culture obtained.  Sterile Q-Tip used to explore the area.  No chamber of pus noted. Minimal EBL.  No complications.   Chaperone was present for exam.  ASSESSMENT  Left vulvar cellulitis.  Very little abscess noted.  No hidradenitis noted today. Back pain.  Negative urine dip.   PLAN  FU wound culture.  Doxycycline 100 mg po bid x 7 days.  FU in 2 - 3 days, sooner if needed. Fu with PCP regarding back pain.    An After Visit Summary was printed and given to the patient.  ___15___ minutes face to face time of which over 50% was spent in counseling.

## 2017-08-24 NOTE — Addendum Note (Signed)
Addended by: Yates Decamp on: 08/24/2017 09:56 AM   Modules accepted: Orders

## 2017-08-25 LAB — WOUND CULTURE: Organism ID, Bacteria: NONE SEEN

## 2017-08-25 NOTE — Progress Notes (Signed)
GYNECOLOGY  VISIT   HPI: 57 y.o.   Divorced  Caucasian  female   [phone] with Patient's last menstrual period was 01/27/2000 (approximate).   here for  Left vulvar cellulitis follow up. Was placed on Doxycycline 100 mg po bid x 7 days.   Wound culture showed staphylococcus lugdunensis. Swelling has reduced.  No pain.   GYNECOLOGIC HISTORY: Patient's last menstrual period was 01/27/2000 (approximate). Contraception:  Hysterectomy Menopausal hormone therapy:  None Last mammogram:  07/19/2017 Bir[facility] 1 negative Last pap smear:   03/09/2017 pap negative HR HPV negative        OB History    Gravida  3   Para  2   Term  2   Preterm      AB  1   Living  2     SAB  1   TAB      Ectopic      Multiple      Live Births                 Patient Active Problem List   Diagnosis Date Noted  . Bursitis of left shoulder 01/24/2016  . GAD (generalized anxiety disorder) 04/02/2015  . Localized primary osteoarthritis of carpometacarpal joint of right thumb 03/15/2015  . Carpal tunnel syndrome, bilateral 04/05/2013  . LGSIL (low grade squamous intraepithelial dysplasia) 06/16/2012  . Diabetes mellitus without complication ([place]) 13/08/6576  . PANIC ATTACK 09/07/2008  . RESTLESS LEG SYNDROME 04/02/2008  . POLYARTHRITIS 04/02/2008  . Primary osteoarthritis of left knee 01/02/2008  . HOT FLASHES 08/04/2006  . HYPERCHOLESTEROLEMIA 11/03/2005  . HYPERTENSION, BENIGN SYSTEMIC 11/03/2005    Past Medical History:  Diagnosis Date  . Abnormal ultrasound of ovary 2016   Ultrasound showing bilateral ovaries in contact with one another.  Asymptomatic.  [name] Allergy   . Diabetes mellitus    borderline  . Fibroid   . Heart murmur   . Hidradenitis   . Hypertension     Past Surgical History:  Procedure Laterality Date  . ABDOMINAL HYSTERECTOMY     supracervical  . BREAST EXCISIONAL BIOPSY    . BREAST SURGERY     cysts removed right breast  . CARPAL TUNNEL RELEASE Right  01/08/2017   Procedure: CARPAL TUNNEL RELEASE, POSSIBLE RIGHT THUMB CARPOMETACARPAL INJECTION;  Surgeon: Roseanne Kaufman, MD;  Location: [facility];  Service: Orthopedics;  Laterality: Right;  60 MINS  . CESAREAN SECTION    . INGUINAL HIDRADENITIS EXCISION    . LEEP  05/04/13   HPV change    Current Outpatient Medications  Medication Sig Dispense Refill  . ACCU-CHEK FASTCLIX LANCETS MISC For use when checking blood sugars up to four times daily. Dx: E11.9 4 each 11  . ACCU-CHEK GUIDE test strip USE TO TEST BLOOD SUGAR TWICE DAILY 100 each 12  . aspirin 81 MG tablet Take 81 mg by mouth daily.      . Blood Glucose Monitoring Suppl (FREESTYLE LITE) DEVI   0  . Cyanocobalamin (B-12) 500 MCG TABS Take 1 tablet by mouth daily.    [name] doxycycline (VIBRAMYCIN) 100 MG capsule Take 1 capsule (100 mg total) by mouth 2 (two) times daily. Take for 7 days.  Take with food as can cause GI distress. 14 capsule 0  . fluticasone (FLONASE) 50 MCG/ACT nasal spray Place 2 sprays into both nostrils daily. 16 g 6  . FOLIC ACID PO Take 469 mcg by mouth daily.     Elmore Guise Devices (  ACCU-CHEK SOFTCLIX) lancets Use as instructed 1 each 11  . losartan-hydrochlorothiazide (HYZAAR) 100-25 MG tablet TAKE 1 TABLET BY MOUTH DAILY. 90 tablet PRN  . metFORMIN (GLUCOPHAGE) 1000 MG tablet TAKE 1 TABLET BY MOUTH 2 TIMES DAILY WITH A MEAL. 180 tablet 3  . Multiple Vitamin (MULTIVITAMIN) tablet Take 1 tablet by mouth daily.      . Omega-3 Fatty Acids (FISH OIL) 1000 MG CAPS Take 1,000 mg by mouth daily.     . pravastatin (PRAVACHOL) 40 MG tablet TAKE 1 TABLET BY MOUTH ONCE DAILY 90 tablet 3  . pyridOXINE (VITAMIN B-6) 100 MG tablet Take 100 mg by mouth daily.    . Zinc Sulfate (ZINC 15 PO) Take by mouth.       No current facility-administered medications for this visit.      ALLERGIES: Sulfonamide derivatives; Xigduo xr [dapagliflozin-metformin hcl er]; Hycodan [hydrocodone-homatropine]; Cefaclor; Cephalexin;  Penicillins; Tetracycline; Victoza [liraglutide]; and Januvia [sitagliptin]  Family History  Problem Relation Age of Onset  . Asthma Mother   . COPD Mother   . Diabetes Mother   . Hypertension Mother   . Diabetes Father   . Hypertension Father   . Breast cancer Maternal Aunt   . Breast cancer Paternal Aunt     Social History   Socioeconomic History  . Marital status: Divorced    Spouse name: Not on file  . Number of children: Not on file  . Years of education: Not on file  . Highest education level: Not on file  Occupational History  . Not on file  Social Needs  . Financial resource strain: Not on file  . Food insecurity:    Worry: Not on file    Inability: Not on file  . Transportation needs:    Medical: Not on file    Non-medical: Not on file  Tobacco Use  . Smoking status: Never Smoker  . Smokeless tobacco: Never Used  Substance and Sexual Activity  . Alcohol use: No    Alcohol/week: 0.0 oz  . Drug use: No  . Sexual activity: Never    Partners: Male    Birth control/protection: Surgical    Comment: TAH/supracervical  Lifestyle  . Physical activity:    Days per week: Not on file    Minutes per session: Not on file  . Stress: Not on file  Relationships  . Social connections:    Talks on phone: Not on file    Gets together: Not on file    Attends religious service: Not on file    Active member of club or organization: Not on file    Attends meetings of clubs or organizations: Not on file    Relationship status: Not on file  . Intimate partner violence:    Fear of current or ex partner: Not on file    Emotionally abused: Not on file    Physically abused: Not on file    Forced sexual activity: Not on file  Other Topics Concern  . Not on file  Social History Narrative  . Not on file    Review of Systems  Constitutional: Negative.   HENT: Negative.   Eyes: Negative.   Respiratory: Negative.   Cardiovascular: Negative.   Gastrointestinal: Negative.    Endocrine: Negative.   Genitourinary: Negative.   Musculoskeletal: Negative.   Skin: Negative.   Allergic/Immunologic: Negative.   Neurological: Negative.   Hematological: Negative.   Psychiatric/Behavioral: Negative.     PHYSICAL EXAMINATION:    BP 128/82 (  BP Location: Left Arm, Patient Position: Sitting, Cuff Size: Normal)   Pulse 78   Ht 4\' 11"  (1.499 m)   Wt 168 lb (76.2 kg)   LMP 01/27/2000 (Approximate)   BMI 33.93 kg/m     General appearance: alert, cooperative and appears stated age   Pelvic: External genitalia:  Left labia majora with 1.5 cm cystic type area under the skin, minimally tender.               Urethra:  normal appearing urethra with no masses, tenderness or lesions              Chaperone was present for exam.  ASSESSMENT  Left vulvar abscess, improving.  Staphylococcus on wound culture.   PLAN  Finish course of doxycycline and return for a recheck in 2 weeks, sooner if needed. We did a literature review of staphylococcus lugdunensis in Up to Date.   An After Visit Summary was printed and given to the patient.  __15____ minutes face to face time of which over 50% was spent in counseling.

## 2017-08-26 ENCOUNTER — Ambulatory Visit: Payer: 59 | Admitting: Obstetrics and Gynecology

## 2017-08-26 ENCOUNTER — Encounter: Payer: Self-pay | Admitting: Obstetrics and Gynecology

## 2017-08-26 VITALS — BP 128/82 | HR 78 | Ht 59.0 in | Wt 168.0 lb

## 2017-08-26 DIAGNOSIS — N764 Abscess of vulva: Secondary | ICD-10-CM | POA: Diagnosis not present

## 2017-08-26 NOTE — Patient Instructions (Addendum)
Your wound culture showed Staphylococcus lugdunensis.  Please complete your course of antibiotics.

## 2017-09-05 ENCOUNTER — Emergency Department
Admission: EM | Admit: 2017-09-05 | Discharge: 2017-09-05 | Disposition: A | Discharge status: Home / Self Care | Payer: 59 | Source: Home / Self Care | Attending: Family Medicine | Admitting: Family Medicine

## 2017-09-05 ENCOUNTER — Other Ambulatory Visit: Payer: Self-pay

## 2017-09-05 DIAGNOSIS — R509 Fever, unspecified: Secondary | ICD-10-CM | POA: Diagnosis not present

## 2017-09-05 DIAGNOSIS — R52 Pain, unspecified: Secondary | ICD-10-CM

## 2017-09-05 NOTE — ED Provider Notes (Signed)
[name]    CSN: [id number] Arrival date & time: 09/05/17  1537     History   Chief Complaint No chief complaint on file.   HPI Maria Nunez is a 57 y.o. female.   HPI Maria Nunez is a 57 y.o. female presenting to UC with c/o 2 days of fever Tmax 100.4*F, chills, clear nasal drainage, body aches and intermittent lower back pain on Left side that radiates to the middle lower back.  She was tx for a staph infection about 3 weeks ago on her Left thigh with doxycycline and f/u with her PCP about 2 weeks ago. That infection had resolved. While there, she had her urine checked due to the back pain and it was normal. Denies dysuria, hematuria or frequency.  Denies tick bites or rashes. No cough, sore throat or ear pain.   Past Medical History:  Diagnosis Date  . Abnormal ultrasound of ovary 2016   Ultrasound showing bilateral ovaries in contact with one another.  Asymptomatic.  [name] Allergy   . Diabetes mellitus    borderline  . Fibroid   . Heart murmur   . Hidradenitis   . Hypertension     Patient Active Problem List   Diagnosis Date Noted  . Bursitis of left shoulder 01/24/2016  . GAD (generalized anxiety disorder) 04/02/2015  . Localized primary osteoarthritis of carpometacarpal joint of right thumb 03/15/2015  . Carpal tunnel syndrome, bilateral 04/05/2013  . LGSIL (low grade squamous intraepithelial dysplasia) 06/16/2012  . Diabetes mellitus without complication ([place]) 38/25/0539  . PANIC ATTACK 09/07/2008  . RESTLESS LEG SYNDROME 04/02/2008  . POLYARTHRITIS 04/02/2008  . Primary osteoarthritis of left knee 01/02/2008  . HOT FLASHES 08/04/2006  . HYPERCHOLESTEROLEMIA 11/03/2005  . HYPERTENSION, BENIGN SYSTEMIC 11/03/2005    Past Surgical History:  Procedure Laterality Date  . ABDOMINAL HYSTERECTOMY     supracervical  . BREAST EXCISIONAL BIOPSY    . BREAST SURGERY     cysts removed right breast  . CARPAL TUNNEL RELEASE Right 01/08/2017     Procedure: CARPAL TUNNEL RELEASE, POSSIBLE RIGHT THUMB CARPOMETACARPAL INJECTION;  Surgeon: Roseanne Kaufman, MD;  Location: [facility];  Service: Orthopedics;  Laterality: Right;  60 MINS  . CESAREAN SECTION    . INGUINAL HIDRADENITIS EXCISION    . LEEP  05/04/13   HPV change    OB History    Gravida  3   Para  2   Term  2   Preterm      AB  1   Living  2     SAB  1   TAB      Ectopic      Multiple      Live Births               Home Medications    Prior to Admission medications   Medication Sig Start Date End Date Taking? Authorizing Provider  ACCU-CHEK FASTCLIX LANCETS MISC For use when checking blood sugars up to four times daily. Dx: E11.9 06/29/17   Hali Marry, MD  ACCU-CHEK GUIDE test strip USE TO TEST BLOOD SUGAR TWICE DAILY 06/28/17   Hali Marry, MD  aspirin 81 MG tablet Take 81 mg by mouth daily.      [provider]  Blood Glucose Monitoring Suppl (FREESTYLE LITE) DEVI  02/05/16   [provider]  Cyanocobalamin (B-12) 500 MCG TABS Take 1 tablet by mouth daily.    [provider]  doxycycline (VIBRAMYCIN) 100 MG capsule Take 1 capsule (100 mg total) by mouth 2 (two) times daily. Take for 7 days.  Take with food as can cause GI distress. 08/23/17   Nunzio Cobbs, MD  fluticasone (FLONASE) 50 MCG/ACT nasal spray Place 2 sprays into both nostrils daily. 04/21/17   Hali Marry, MD  FOLIC ACID PO Take 195 mcg by mouth daily.     [provider]  Lancet Devices (ACCU-CHEK [facility]) lancets Use as instructed 02/05/16   Hali Marry, MD  losartan-hydrochlorothiazide (HYZAAR) 100-25 MG tablet TAKE 1 TABLET BY MOUTH DAILY. 06/24/17   Hali Marry, MD  metFORMIN (GLUCOPHAGE) 1000 MG tablet TAKE 1 TABLET BY MOUTH 2 TIMES DAILY WITH A MEAL. 06/24/17   Hali Marry, MD  Multiple Vitamin (MULTIVITAMIN) tablet Take 1 tablet by mouth daily.      [provider]  Omega-3 Fatty Acids (FISH OIL) 1000 MG CAPS Take 1,000 mg by mouth daily.     [provider]  pravastatin (PRAVACHOL) 40 MG tablet TAKE 1 TABLET BY MOUTH ONCE DAILY 06/25/17   Hali Marry, MD  pyridOXINE (VITAMIN B-6) 100 MG tablet Take 100 mg by mouth daily.    [provider]  Zinc Sulfate (ZINC 15 PO) Take by mouth.      [provider]    Family History Family History  Problem Relation Age of Onset  . Asthma Mother   . COPD Mother   . Diabetes Mother   . Hypertension Mother   . Diabetes Father   . Hypertension Father   . Breast cancer Maternal Aunt   . Breast cancer Paternal Aunt     Social History Social History   Tobacco Use  . Smoking status: Never Smoker  . Smokeless tobacco: Never Used  Substance Use Topics  . Alcohol use: No    Alcohol/week: 0.0 standard drinks  . Drug use: No     Allergies   Sulfonamide derivatives; Xigduo xr [dapagliflozin-metformin hcl er]; Hycodan [hydrocodone-homatropine]; Cefaclor; Cephalexin; Penicillins; Tetracycline; Victoza [liraglutide]; and Januvia [sitagliptin]   Review of Systems Review of Systems  Constitutional: Positive for chills and fever.  HENT: Positive for congestion and rhinorrhea. Negative for ear pain, sore throat, trouble swallowing and voice change.   Respiratory: Negative for cough and shortness of breath.   Cardiovascular: Negative for chest pain and palpitations.  Gastrointestinal: Negative for abdominal pain, diarrhea, nausea and vomiting.  Musculoskeletal: Positive for back pain and myalgias. Negative for arthralgias.  Skin: Negative for rash.  Neurological: Positive for headaches (minimal). Negative for dizziness and light-headedness.     Physical Exam Triage Vital Signs ED Triage Vitals [09/05/17 1606]  Enc Vitals Group     BP (!) 142/83     Pulse Rate 90     Resp      Temp 99 F (37.2 C)     Temp Source Oral     SpO2 92 %     Weight 169 lb  (76.7 kg)     Height 4\' 11"  (1.499 m)     Head Circumference      Peak Flow      Pain Score 0     Pain Loc      Pain Edu?      Excl. in [place]?    No data found.  Updated Vital Signs BP (!) 142/83 (BP Location: Right Arm)   Pulse 90   Temp 99 F (37.2  C) (Oral)   Ht 4\' 11"  (1.499 m)   Wt 169 lb (76.7 kg)   LMP 01/27/2000 (Approximate)   SpO2 92%   BMI 34.13 kg/m   Visual Acuity Right Eye Distance:   Left Eye Distance:   Bilateral Distance:    Right Eye Near:   Left Eye Near:    Bilateral Near:     Physical Exam  Constitutional: She is oriented to person, place, and time. She appears well-developed and well-nourished.  HENT:  Head: Normocephalic and atraumatic.  Right Ear: Tympanic membrane normal.  Left Ear: Tympanic membrane normal.  Nose: Nose normal.  Mouth/Throat: Uvula is midline, oropharynx is clear and moist and mucous membranes are normal.  Eyes: EOM are normal.  Neck: Normal range of motion. Neck supple.  Cardiovascular: Normal rate and regular rhythm.  Pulmonary/Chest: Effort normal and breath sounds normal. No stridor. No respiratory distress. She has no wheezes. She has no rales.  Abdominal: Soft. She exhibits no distension. There is no tenderness. There is no CVA tenderness.  Musculoskeletal: Normal range of motion.  Neurological: She is alert and oriented to person, place, and time.  Skin: Skin is warm and dry.  Psychiatric: She has a normal mood and affect. Her behavior is normal.  Nursing note and vitals reviewed.    UC Treatments / Results  Labs (all labs ordered are listed, but only abnormal results are displayed) Labs Reviewed - No data to display  EKG None  Radiology No results found.  Procedures Procedures (including critical care time)  Medications Ordered in UC Medications - No data to display  Initial Impression / Assessment and Plan / UC Course  I have reviewed the triage vital signs and the nursing notes.  Pertinent labs &  imaging results that were available during my care of the patient were reviewed by me and considered in my medical decision making (see chart for details).     Hx and exam c/w viral illness Offered to recheck urine, pt declined. Encouraged symptomatic treatment.   Final Clinical Impressions(s) / UC Diagnoses   Final diagnoses:  Fever and chills  Body aches     Discharge Instructions      You may take 500mg  acetaminophen every 4-6 hours or in combination with ibuprofen 400-600mg  every 6-8 hours as needed for pain, inflammation, and fever.  Be sure to stay well hydrated and get at least 8 hours of sleep at night, preferably more while sick.   Please follow up with family medicine in 1 week if not improving, sooner if worsening.     ED Prescriptions    None     Controlled Substance Prescriptions [place] Controlled Substance Registry consulted? Not Applicable   Tyrell Antonio 09/05/17 1731

## 2017-09-05 NOTE — Discharge Instructions (Signed)
°  You may take 500mg  acetaminophen every 4-6 hours or in combination with ibuprofen 400-600mg  every 6-8 hours as needed for pain, inflammation, and fever.  Be sure to stay well hydrated and get at least 8 hours of sleep at night, preferably more while sick.   Please follow up with family medicine in 1 week if not improving, sooner if worsening.

## 2017-09-05 NOTE — ED Triage Notes (Signed)
Pt c/o runny nose (clear drainage), fever, chills since Fri. Fever was up to 100.4 yesterday. Has not taken ibuprofen today. Also has intermittent lower back pain that starts on LT side and travels to middle. Urine checked 2 wks ago and was normal.

## 2017-09-06 ENCOUNTER — Telehealth: Payer: Self-pay | Admitting: Obstetrics and Gynecology

## 2017-09-06 NOTE — Telephone Encounter (Signed)
Left message to call Sharee Pimple at [phone].  Patient to return for 2wk recheck of vulvar abscess, last seen on 08/26/17.

## 2017-09-06 NOTE — Telephone Encounter (Signed)
Patient called in to cancel her 2 week re check on 09/08/17. Patient states the infection has cleared up.

## 2017-09-08 ENCOUNTER — Ambulatory Visit: Payer: 59 | Admitting: Obstetrics and Gynecology

## 2017-09-14 NOTE — Telephone Encounter (Signed)
Spoke with patient, reports "infection has cleared". 2 wk recheck scheduled for 09/16/17 at 4pm with Dr. Quincy Simmonds.   Routing to provider for final review. Patient is agreeable to disposition. Will close encounter.

## 2017-09-14 NOTE — Telephone Encounter (Signed)
Left message to call Kaitlyn at [phone]. 

## 2017-09-16 ENCOUNTER — Ambulatory Visit: Payer: 59 | Admitting: Obstetrics and Gynecology

## 2017-09-16 ENCOUNTER — Encounter: Payer: Self-pay | Admitting: Obstetrics and Gynecology

## 2017-09-16 VITALS — BP 130/78 | HR 80 | Resp 16 | Ht 59.0 in | Wt 169.0 lb

## 2017-09-16 DIAGNOSIS — N764 Abscess of vulva: Secondary | ICD-10-CM

## 2017-09-16 NOTE — Progress Notes (Signed)
GYNECOLOGY  VISIT   HPI: 57 y.o.   Divorced  Caucasian  female   [phone] with Patient's last menstrual period was 01/27/2000 (approximate).   here for vulvar abscess recheck     Left vulvar abscess with staphylococcus lugdunensis.  Treated with doxycycline. Feels normal again.   Went to urgent care for fever and flu symptoms.  Diagnosed with a virus.   GYNECOLOGIC HISTORY: Patient's last menstrual period was 01/27/2000 (approximate). Contraception:  Hysterectomy Menopausal hormone therapy:  none Last mammogram:  07/19/17 BIRADS 1 negative Last pap smear:   03/09/17 Neg:Neg HR HPV        OB History    Gravida  3   Para  2   Term  2   Preterm      AB  1   Living  2     SAB  1   TAB      Ectopic      Multiple      Live Births                 Patient Active Problem List   Diagnosis Date Noted  . Bursitis of left shoulder 01/24/2016  . GAD (generalized anxiety disorder) 04/02/2015  . Localized primary osteoarthritis of carpometacarpal joint of right thumb 03/15/2015  . Carpal tunnel syndrome, bilateral 04/05/2013  . LGSIL (low grade squamous intraepithelial dysplasia) 06/16/2012  . Diabetes mellitus without complication ([place]) 81/44/8185  . PANIC ATTACK 09/07/2008  . RESTLESS LEG SYNDROME 04/02/2008  . POLYARTHRITIS 04/02/2008  . Primary osteoarthritis of left knee 01/02/2008  . HOT FLASHES 08/04/2006  . HYPERCHOLESTEROLEMIA 11/03/2005  . HYPERTENSION, BENIGN SYSTEMIC 11/03/2005    Past Medical History:  Diagnosis Date  . Abnormal ultrasound of ovary 2016   Ultrasound showing bilateral ovaries in contact with one another.  Asymptomatic.  [name] Allergy   . Diabetes mellitus    borderline  . Fibroid   . Heart murmur   . Hidradenitis   . Hypertension     Past Surgical History:  Procedure Laterality Date  . ABDOMINAL HYSTERECTOMY     supracervical  . BREAST EXCISIONAL BIOPSY    . BREAST SURGERY     cysts removed right breast  . CARPAL TUNNEL  RELEASE Right 01/08/2017   Procedure: CARPAL TUNNEL RELEASE, POSSIBLE RIGHT THUMB CARPOMETACARPAL INJECTION;  Surgeon: Roseanne Kaufman, MD;  Location: [place];  Service: Orthopedics;  Laterality: Right;  60 MINS  . CESAREAN SECTION    . INGUINAL HIDRADENITIS EXCISION    . LEEP  05/04/13   HPV change    Current Outpatient Medications  Medication Sig Dispense Refill  . ACCU-CHEK FASTCLIX LANCETS MISC For use when checking blood sugars up to four times daily. Dx: E11.9 4 each 11  . ACCU-CHEK GUIDE test strip USE TO TEST BLOOD SUGAR TWICE DAILY 100 each 12  . aspirin 81 MG tablet Take 81 mg by mouth daily.      . Blood Glucose Monitoring Suppl (FREESTYLE LITE) DEVI   0  . Cyanocobalamin (B-12) 500 MCG TABS Take 1 tablet by mouth daily.    . fluticasone (FLONASE) 50 MCG/ACT nasal spray Place 2 sprays into both nostrils daily. 16 g 6  . FOLIC ACID PO Take 631 mcg by mouth daily.     Elmore Guise Devices (ACCU-CHEK SOFTCLIX) lancets Use as instructed 1 each 11  . losartan-hydrochlorothiazide (HYZAAR) 100-25 MG tablet TAKE 1 TABLET BY MOUTH DAILY. 90 tablet PRN  . metFORMIN (GLUCOPHAGE) 1000 MG  tablet TAKE 1 TABLET BY MOUTH 2 TIMES DAILY WITH A MEAL. 180 tablet 3  . Multiple Vitamin (MULTIVITAMIN) tablet Take 1 tablet by mouth daily.      . Omega-3 Fatty Acids (FISH OIL) 1000 MG CAPS Take 1,000 mg by mouth daily.     . pravastatin (PRAVACHOL) 40 MG tablet TAKE 1 TABLET BY MOUTH ONCE DAILY 90 tablet 3  . pyridOXINE (VITAMIN B-6) 100 MG tablet Take 100 mg by mouth daily.    . Zinc Sulfate (ZINC 15 PO) Take by mouth.       No current facility-administered medications for this visit.      ALLERGIES: Sulfonamide derivatives; Xigduo xr [dapagliflozin-metformin hcl er]; Hycodan [hydrocodone-homatropine]; Cefaclor; Cephalexin; Penicillins; Tetracycline; Victoza [liraglutide]; and Januvia [sitagliptin]  Family History  Problem Relation Age of Onset  . Asthma Mother   . COPD Mother   .  Diabetes Mother   . Hypertension Mother   . Diabetes Father   . Hypertension Father   . Breast cancer Maternal Aunt   . Breast cancer Paternal Aunt     Social History   Socioeconomic History  . Marital status: Divorced    Spouse name: Not on file  . Number of children: Not on file  . Years of education: Not on file  . Highest education level: Not on file  Occupational History  . Not on file  Social Needs  . Financial resource strain: Not on file  . Food insecurity:    Worry: Not on file    Inability: Not on file  . Transportation needs:    Medical: Not on file    Non-medical: Not on file  Tobacco Use  . Smoking status: Never Smoker  . Smokeless tobacco: Never Used  Substance and Sexual Activity  . Alcohol use: No    Alcohol/week: 0.0 standard drinks  . Drug use: No  . Sexual activity: Not Currently    Partners: Male    Birth control/protection: Surgical    Comment: TAH/supracervical  Lifestyle  . Physical activity:    Days per week: Not on file    Minutes per session: Not on file  . Stress: Not on file  Relationships  . Social connections:    Talks on phone: Not on file    Gets together: Not on file    Attends religious service: Not on file    Active member of club or organization: Not on file    Attends meetings of clubs or organizations: Not on file    Relationship status: Not on file  . Intimate partner violence:    Fear of current or ex partner: Not on file    Emotionally abused: Not on file    Physically abused: Not on file    Forced sexual activity: Not on file  Other Topics Concern  . Not on file  Social History Narrative  . Not on file    Review of Systems  All other systems reviewed and are negative.   PHYSICAL EXAMINATION:    BP 130/78 (BP Location: Right Arm, Patient Position: Sitting, Cuff Size: Normal)   Pulse 80   Resp 16   Ht 4\' 11"  (1.499 m)   Wt 169 lb (76.7 kg)   LMP 01/27/2000 (Approximate)   BMI 34.13 kg/m     General  appearance: alert, cooperative and appears stated age   Pelvic: External genitalia:  no lesions              Urethra:  normal  appearing urethra with no masses, tenderness or lesions              Bartholins and Skenes: normal               Chaperone was present for exam.  ASSESSMENT  Left vulvar abscess resolved.  No residual cystic mass of the vulva.  PLAN  If has problems with recurrent vulvar infections,  We talked about Hibiclens wash once weekly or the use of Cleocin solution for vulva skin cleansing. Return prn.   An After Visit Summary was printed and given to the patient.  _15_____ minutes face to face time of which over 50% was spent in counseling.

## 2017-09-20 DIAGNOSIS — D239 Other benign neoplasm of skin, unspecified: Secondary | ICD-10-CM | POA: Diagnosis not present

## 2017-09-20 DIAGNOSIS — L820 Inflamed seborrheic keratosis: Secondary | ICD-10-CM | POA: Diagnosis not present

## 2017-09-20 DIAGNOSIS — L821 Other seborrheic keratosis: Secondary | ICD-10-CM | POA: Diagnosis not present

## 2017-09-23 MED FILL — metFORMIN HCL 1000 MG TABS: 1000 | 90 days supply | Qty: 180 | Fill #1

## 2017-09-23 MED FILL — LOSARTAN-HCTZ 100-25 MG TAB: 100-25 | 90 days supply | Qty: 90 | Fill #1

## 2017-10-14 DIAGNOSIS — M545 Low back pain: Secondary | ICD-10-CM | POA: Diagnosis not present

## 2017-10-14 DIAGNOSIS — M62830 Muscle spasm of back: Secondary | ICD-10-CM | POA: Diagnosis not present

## 2017-10-14 DIAGNOSIS — M5412 Radiculopathy, cervical region: Secondary | ICD-10-CM | POA: Diagnosis not present

## 2017-10-14 DIAGNOSIS — M542 Cervicalgia: Secondary | ICD-10-CM | POA: Diagnosis not present

## 2017-10-14 MED FILL — PRAVASTATIN NA 40 MG TAB: 40 | 90 days supply | Qty: 90 | Fill #1

## 2017-10-19 ENCOUNTER — Encounter: Payer: Self-pay | Admitting: Sports Medicine

## 2017-10-19 ENCOUNTER — Ambulatory Visit: Payer: 59 | Admitting: Sports Medicine

## 2017-10-19 DIAGNOSIS — M545 Low back pain: Secondary | ICD-10-CM | POA: Diagnosis not present

## 2017-10-19 DIAGNOSIS — M542 Cervicalgia: Secondary | ICD-10-CM | POA: Diagnosis not present

## 2017-10-19 DIAGNOSIS — M1811 Unilateral primary osteoarthritis of first carpometacarpal joint, right hand: Secondary | ICD-10-CM | POA: Diagnosis not present

## 2017-10-19 DIAGNOSIS — M5412 Radiculopathy, cervical region: Secondary | ICD-10-CM | POA: Diagnosis not present

## 2017-10-19 DIAGNOSIS — M62830 Muscle spasm of back: Secondary | ICD-10-CM | POA: Diagnosis not present

## 2017-10-19 NOTE — Assessment & Plan Note (Signed)
Good response to previous right CMC injection, this was done back approximately 4 to 5 months ago. Repeat right CMC injection today, return as needed.

## 2017-10-19 NOTE — Progress Notes (Signed)
Subjective:    CC: Right hand pain  HPI: This is a pleasant 57 year old female, she has had a few weeks of worsening pain in her right hand, localized at the base of the thumb.  Last injection was approximately 4-1/2 months ago.  Pain is severe, worsening, localized without radiation.  No trauma.  No constitutional symptoms.  I reviewed the past medical history, family history, social history, surgical history, and allergies today and no changes were needed.  Please see the problem list section below in epic for further details.  Past Medical History: Past Medical History:  Diagnosis Date  . Abnormal ultrasound of ovary 2016   Ultrasound showing bilateral ovaries in contact with one another.  Asymptomatic.  [name] Allergy   . Diabetes mellitus    borderline  . Fibroid   . Heart murmur   . Hidradenitis   . Hypertension    Past Surgical History: Past Surgical History:  Procedure Laterality Date  . ABDOMINAL HYSTERECTOMY     supracervical  . BREAST EXCISIONAL BIOPSY    . BREAST SURGERY     cysts removed right breast  . CARPAL TUNNEL RELEASE Right 01/08/2017   Procedure: CARPAL TUNNEL RELEASE, POSSIBLE RIGHT THUMB CARPOMETACARPAL INJECTION;  Surgeon: Roseanne Kaufman, MD;  Location: [place];  Service: Orthopedics;  Laterality: Right;  60 MINS  . CESAREAN SECTION    . INGUINAL HIDRADENITIS EXCISION    . LEEP  05/04/13   HPV change   Social History: Social History   Socioeconomic History  . Marital status: Divorced    Spouse name: Not on file  . Number of children: Not on file  . Years of education: Not on file  . Highest education level: Not on file  Occupational History  . Not on file  Social Needs  . Financial resource strain: Not on file  . Food insecurity:    Worry: Not on file    Inability: Not on file  . Transportation needs:    Medical: Not on file    Non-medical: Not on file  Tobacco Use  . Smoking status: Never Smoker  . Smokeless tobacco:  Never Used  Substance and Sexual Activity  . Alcohol use: No    Alcohol/week: 0.0 standard drinks  . Drug use: No  . Sexual activity: Not Currently    Partners: Male    Birth control/protection: Surgical    Comment: TAH/supracervical  Lifestyle  . Physical activity:    Days per week: Not on file    Minutes per session: Not on file  . Stress: Not on file  Relationships  . Social connections:    Talks on phone: Not on file    Gets together: Not on file    Attends religious service: Not on file    Active member of club or organization: Not on file    Attends meetings of clubs or organizations: Not on file    Relationship status: Not on file  Other Topics Concern  . Not on file  Social History Narrative  . Not on file   Family History: Family History  Problem Relation Age of Onset  . Asthma Mother   . COPD Mother   . Diabetes Mother   . Hypertension Mother   . Diabetes Father   . Hypertension Father   . Breast cancer Maternal Aunt   . Breast cancer Paternal Aunt    Allergies: Allergies  Allergen Reactions  . Sulfonamide Derivatives Hives    REACTION: hives  .  Xigduo Xr [Dapagliflozin-Metformin Hcl Er] Hives  . Hycodan [Hydrocodone-Homatropine] Itching  . Cefaclor   . Cephalexin   . Penicillins   . Tetracycline   . Victoza [Liraglutide] Other (See Comments)    Back pain  . Januvia [Sitagliptin] Nausea Only    Back pain   Medications: See med rec.  Review of Systems: No fevers, chills, night sweats, weight loss, chest pain, or shortness of breath.   Objective:    General: Well Developed, well nourished, and in no acute distress.  Neuro: Alert and oriented x3, extra-ocular muscles intact, sensation grossly intact.  HEENT: Normocephalic, atraumatic, pupils equal round reactive to light, neck supple, no masses, no lymphadenopathy, thyroid nonpalpable.  Skin: Warm and dry, no rashes. Cardiac: Regular rate and rhythm, no murmurs rubs or gallops, no lower  extremity edema.  Respiratory: Clear to auscultation bilaterally. Not using accessory muscles, speaking in full sentences. Right wrist: Inspection normal with no visible erythema or swelling. ROM smooth and normal with good flexion and extension and ulnar/radial deviation that is symmetrical with opposite wrist. Palpation is normal over metacarpals, navicular, lunate, and TFCC; tendons without tenderness/ swelling No snuffbox tenderness. No tenderness over Canal of Guyon. Strength 5/5 in all directions without pain. Negative tinel's and phalens signs. Negative Finkelstein sign. Negative Watson's test. Only mild tenderness at the thumb basal joint, minimal erythema without induration or warmth.  Procedure: Real-time Ultrasound Guided Injection of right first [facility] Device: GE Logiq E  Verbal informed consent obtained.  Time-out conducted.  Noted no overlying erythema, induration, or other signs of local infection.  Skin prepped in a sterile fashion.  Local anesthesia: Topical Ethyl chloride.  With sterile technique and under real time ultrasound guidance: 25-gauge needle advanced into the joint, injected 1/2 cc Kenalog 40, 1/2 cc lidocaine. Completed without difficulty  Pain immediately resolved suggesting accurate placement of the medication.  Advised to call if fevers/chills, erythema, induration, drainage, or persistent bleeding.  Images permanently stored and available for review in the ultrasound unit.  Impression: Technically successful ultrasound guided injection.  Impression and Recommendations:    Localized primary osteoarthritis of carpometacarpal joint of right thumb Good response to previous right CMC injection, this was done back approximately 4 to 5 months ago. Repeat right CMC injection today, return as needed. ___________________________________________ Gwen Her. Dianah Field, M.D., ABFM., CAQSM. Primary Care and [place] Instructor of [place] [employer] [facility] [employer]

## 2017-10-25 ENCOUNTER — Ambulatory Visit: Payer: 59 | Admitting: Sports Medicine

## 2017-10-28 MED FILL — ACCU-CHEK GUIDE STRP: 50 days supply | Qty: 100 | Fill #1

## 2017-11-01 ENCOUNTER — Ambulatory Visit: Payer: 59 | Admitting: Family Medicine

## 2017-11-01 ENCOUNTER — Ambulatory Visit: Payer: 59 | Admitting: Sports Medicine

## 2017-11-11 ENCOUNTER — Encounter: Payer: Self-pay | Admitting: Podiatry

## 2017-11-11 ENCOUNTER — Ambulatory Visit: Payer: 59 | Admitting: Podiatry

## 2017-11-11 VITALS — BP 141/79 | HR 85 | Resp 16

## 2017-11-11 DIAGNOSIS — L600 Ingrowing nail: Secondary | ICD-10-CM | POA: Diagnosis not present

## 2017-11-11 MED ORDER — NEOMYCIN-POLYMYXIN-HC 1 % OT SOLN: OTIC | 1 refills | Status: DC

## 2017-11-11 MED FILL — NEO/POLYMYXIN/HC EAR SOLN: 3.5-10000-1 | 50 days supply | Qty: 10 | Fill #0

## 2017-11-11 NOTE — Progress Notes (Signed)
Subjective:  Patient ID: Maria Nunez, female    DOB: 1960-04-22,  MRN: [id number] HPI Chief Complaint  Patient presents with  . Toe Pain    Hallux right - both borders, tender x 3 months, tried trimming, closed shoes make worse  . Diabetes    Last a1c was 6.8  . New Patient (Initial Visit)    57 y.o. female presents with the above complaint.   ROS: Denies fever chills nausea vomiting muscle aches pains calf pain back pain chest pain shortness of breath.  Past Medical History:  Diagnosis Date  . Abnormal ultrasound of ovary 2016   Ultrasound showing bilateral ovaries in contact with one another.  Asymptomatic.  [name] Allergy   . Diabetes mellitus    borderline  . Fibroid   . Heart murmur   . Hidradenitis   . Hypertension    Past Surgical History:  Procedure Laterality Date  . ABDOMINAL HYSTERECTOMY     supracervical  . BREAST EXCISIONAL BIOPSY    . BREAST SURGERY     cysts removed right breast  . CARPAL TUNNEL RELEASE Right 01/08/2017   Procedure: CARPAL TUNNEL RELEASE, POSSIBLE RIGHT THUMB CARPOMETACARPAL INJECTION;  Surgeon: Roseanne Kaufman, MD;  Location: [place];  Service: Orthopedics;  Laterality: Right;  60 MINS  . CESAREAN SECTION    . INGUINAL HIDRADENITIS EXCISION    . LEEP  05/04/13   HPV change    Current Outpatient Medications:  .  ACCU-CHEK FASTCLIX LANCETS MISC, For use when checking blood sugars up to four times daily. Dx: E11.9, Disp: 4 each, Rfl: 11 .  ACCU-CHEK GUIDE test strip, USE TO TEST BLOOD SUGAR TWICE DAILY, Disp: 100 each, Rfl: 12 .  aspirin 81 MG tablet, Take 81 mg by mouth daily.  , Disp: , Rfl:  .  Blood Glucose Monitoring Suppl (FREESTYLE LITE) DEVI, , Disp: , Rfl: 0 .  Cyanocobalamin (B-12) 500 MCG TABS, Take 1 tablet by mouth daily., Disp: , Rfl:  .  fluticasone (FLONASE) 50 MCG/ACT nasal spray, Place 2 sprays into both nostrils daily., Disp: 16 g, Rfl: 6 .  FOLIC ACID PO, Take 017 mcg by mouth daily. , Disp: , Rfl:   .  Lancet Devices (ACCU-CHEK SOFTCLIX) lancets, Use as instructed, Disp: 1 each, Rfl: 11 .  losartan-hydrochlorothiazide (HYZAAR) 100-25 MG tablet, TAKE 1 TABLET BY MOUTH DAILY., Disp: 90 tablet, Rfl: PRN .  metFORMIN (GLUCOPHAGE) 1000 MG tablet, TAKE 1 TABLET BY MOUTH 2 TIMES DAILY WITH A MEAL., Disp: 180 tablet, Rfl: 3 .  Multiple Vitamin (MULTIVITAMIN) tablet, Take 1 tablet by mouth daily.  , Disp: , Rfl:  .  Omega-3 Fatty Acids (FISH OIL) 1000 MG CAPS, Take 1,000 mg by mouth daily. , Disp: , Rfl:  .  pravastatin (PRAVACHOL) 40 MG tablet, TAKE 1 TABLET BY MOUTH ONCE DAILY, Disp: 90 tablet, Rfl: 3 .  pyridOXINE (VITAMIN B-6) 100 MG tablet, Take 100 mg by mouth daily., Disp: , Rfl:  .  Zinc Sulfate (ZINC 15 PO), Take by mouth.  , Disp: , Rfl:   Allergies  Allergen Reactions  . Sulfonamide Derivatives Hives    REACTION: hives  . Xigduo Xr [Dapagliflozin-Metformin Hcl Er] Hives  . Hycodan [Hydrocodone-Homatropine] Itching  . Cefaclor   . Cephalexin   . Penicillins   . Tetracycline   . Victoza [Liraglutide] Other (See Comments)    Back pain  . Januvia [Sitagliptin] Nausea Only    Back pain   Review of Systems  Objective:   Vitals:   11/11/17 1327  BP: (!) 141/79  Pulse: 85  Resp: 16    General: Well developed, nourished, in no acute distress, alert and oriented x3   Dermatological: Skin is warm, dry and supple bilateral. Nails x 10 are well maintained; remaining integument appears unremarkable at this time. There are no open sores, no preulcerative lesions, no rash or signs of infection present.  Painful ingrown toenail tibiofibular border of the hallux right.  Mild erythema no edema cellulitis drainage or odor.  Vascular: Dorsalis Pedis artery and Posterior Tibial artery pedal pulses are 2/4 bilateral with immedate capillary fill time. Pedal hair growth present. No varicosities and no lower extremity edema present bilateral.   Neruologic: Grossly intact via light touch  bilateral. Vibratory intact via tuning fork bilateral. Protective threshold with Semmes Wienstein monofilament intact to all pedal sites bilateral. Patellar and Achilles deep tendon reflexes 2+ bilateral. No Babinski or clonus noted bilateral.   Musculoskeletal: No gross boney pedal deformities bilateral. No pain, crepitus, or limitation noted with foot and ankle range of motion bilateral. Muscular strength 5/5 in all groups tested bilateral.  Gait: Unassisted, Nonantalgic.    Radiographs:  None taken  Assessment & Plan:   Assessment: Ingrown toenails both borders hallux right  Plan: Discussed etiology pathology conservative surgical therapies chemical matrixectomy's were performed today after local anesthetic was administered.  She tolerated procedure well without complications to both borders tibial and fibular border hallux right.  She was given both oral and written home-going instructions for the care and soaking of her toe as well as prescription for Cortisporin Otic to be applied twice daily after soaking.     Max T. [place], [place]

## 2017-11-11 NOTE — Patient Instructions (Signed)

## 2017-11-16 ENCOUNTER — Encounter: Payer: Self-pay | Admitting: Family Medicine

## 2017-11-25 ENCOUNTER — Ambulatory Visit: Payer: 59 | Admitting: Family Medicine

## 2017-11-25 ENCOUNTER — Encounter: Payer: Self-pay | Admitting: Family Medicine

## 2017-11-25 ENCOUNTER — Other Ambulatory Visit: Payer: 59

## 2017-11-25 VITALS — BP 140/68 | HR 86 | Ht 59.0 in | Wt 166.0 lb

## 2017-11-25 DIAGNOSIS — R0789 Other chest pain: Secondary | ICD-10-CM

## 2017-11-25 DIAGNOSIS — I10 Essential (primary) hypertension: Secondary | ICD-10-CM | POA: Diagnosis not present

## 2017-11-25 DIAGNOSIS — F430 Acute stress reaction: Secondary | ICD-10-CM | POA: Diagnosis not present

## 2017-11-25 DIAGNOSIS — E119 Type 2 diabetes mellitus without complications: Secondary | ICD-10-CM

## 2017-11-25 LAB — POCT GLYCOSYLATED HEMOGLOBIN (HGB A1C): Hemoglobin A1C: 6.7 % — AB (ref 4.0–5.6)

## 2017-11-25 LAB — TSH: TSH: 0.96 mIU/L (ref 0.40–4.50)

## 2017-11-25 MED ORDER — CLONAZEPAM 0.5 MG PO TABS: 0.2500 mg | ORAL_TABLET | Freq: Two times a day (BID) | ORAL | 0 refills | Status: DC | PRN

## 2017-11-25 NOTE — Addendum Note (Signed)
Addended by: Beatrice Lecher D on: 11/25/2017 12:55 PM   Modules accepted: Orders

## 2017-11-25 NOTE — Addendum Note (Signed)
Addended by: Teddy Spike on: 11/25/2017 12:54 PM   Modules accepted: Orders

## 2017-11-25 NOTE — Progress Notes (Signed)
Subjective:    CC: DM, BP  HPI:  Hypertension- Pt denies chest pain, SOB, dizziness, or heart palpitations.  Taking meds as directed w/o problems.  Denies medication side effects.  Did bring in a home log that records her blood pressures and pulse.  Most of her blood pressures run between 101 130 on her personal log.  She does have a wrist cuff.  Diabetes - no hypoglycemic events. No wounds or sores that are not healing well. No increased thirst or urination. Checking glucose at home. Taking medications as prescribed without any side effects.  She has been under some increased stress lately.  In fact she is in a my chart note about may be getting some medication to just take as needed.  Sometimes will start to breathe quickly and almost hyperventilate.  She is been having some intermittent left-sided chest pain.  It can happen at rest or with activity.  It just seems to come and go she notices it more in the mornings.  She says it really just feels a heaviness.  No diaphoresis no numbness or tingling no pain radiating into the jaw.  She denies any heartburn or reflux symptoms.  She does have diabetes and has a family history of heart disease in her father.  Not want to take any medications daily.  Past medical history, Surgical history, Family history not pertinant except as noted below, Social history, Allergies, and medications have been entered into the medical record, reviewed, and corrections made.   Review of Systems: No fevers, chills, night sweats, weight loss, chest pain, or shortness of breath.   Objective:    General: Well Developed, well nourished, and in no acute distress.  Neuro: Alert and oriented x3, extra-ocular muscles intact, sensation grossly intact.  HEENT: Normocephalic, atraumatic  Skin: Warm and dry, no rashes. Cardiac: Regular rate and rhythm, no murmurs rubs or gallops, no lower extremity edema.  Respiratory: Clear to auscultation bilaterally. Not using accessory  muscles, speaking in full sentences.   Impression and Recommendations:    Hypertension-blood pressure on our cuff was a little higher than her home cuff.  The home blood pressures do look good in fact some of them are low which I suspect are probably really truly in the normal range.  DM - Well controlled.  Globin A1c of 6.7 today.  Continue current regimen and be careful through the holidays.  Continue current regimen. Follow up in  3-4 months.    Atypical left-sided chest pain-discussed that I think it is probably unrelated to cardiac cause.  I suspect probably more stress related but because she is over 2 has diabetes and a family history of heart disease I think it be prudent to evaluate her for coronary artery disease.  EKG today.  We will schedule for exercise treadmill test.  Acute distress-I did give her a small prescription of benzodiazepine to use sparingly.  We did warn about potential side effects including dependency and to make sure that she is using it very sparingly.  Also warned about sedation and not taking medication driving.

## 2017-11-25 NOTE — Addendum Note (Signed)
Addended by: Teddy Spike on: 11/25/2017 08:41 AM   Modules accepted: Orders

## 2017-11-26 LAB — CBC
HCT: 39.7 % (ref 35.0–45.0)
Hemoglobin: 13.3 g/dL (ref 11.7–15.5)
MCH: 29.4 pg (ref 27.0–33.0)
MCHC: 33.5 g/dL (ref 32.0–36.0)
MCV: 87.6 fL (ref 80.0–100.0)
MPV: 9.9 fL (ref 7.5–12.5)
Platelets: 288 10*3/uL (ref 140–400)
RBC: 4.53 10*6/uL (ref 3.80–5.10)
RDW: 12.6 % (ref 11.0–15.0)
WBC: 8.4 10*3/uL (ref 3.8–10.8)

## 2017-11-26 LAB — BASIC METABOLIC PANEL WITH GFR
BUN: 16 mg/dL (ref 7–25)
CO2: 28 mmol/L (ref 20–32)
Calcium: 10.1 mg/dL (ref 8.6–10.4)
Chloride: 102 mmol/L (ref 98–110)
Creat: 0.63 mg/dL (ref 0.50–1.05)
GFR, Est African American: 115 mL/min/{1.73_m2} (ref >=60)
GFR, Est Non African American: 100 mL/min/{1.73_m2} (ref >=60)
Glucose, Bld: 120 mg/dL — ABNORMAL HIGH (ref 65–99)
Potassium: 3.5 mmol/L (ref 3.5–5.3)
Sodium: 141 mmol/L (ref 135–146)

## 2017-11-26 LAB — TROPONIN I: Troponin I: <0.01 ng/mL (ref <=0.0)

## 2017-11-27 ENCOUNTER — Encounter: Payer: Self-pay | Admitting: Family Medicine

## 2017-11-29 ENCOUNTER — Encounter: Payer: Self-pay | Admitting: Family Medicine

## 2017-11-29 ENCOUNTER — Ambulatory Visit: Payer: 59 | Admitting: Family Medicine

## 2017-11-29 DIAGNOSIS — R1031 Right lower quadrant pain: Secondary | ICD-10-CM | POA: Diagnosis not present

## 2017-11-29 NOTE — Progress Notes (Signed)
Subjective:    Patient ID: Maria Nunez, female    DOB: 1960/02/08, 57 y.o.   MRN: [id number]  HPI RLQ towards her groin. she reports that the pain began saturday night. the pain is like a sharp pinch it comes and goes she did not have any pain on sunday denies any f/s/c/n/v. she stated that the pinching causes her to have difficulty w/breathing and the CP comes back no problems w/movement. she feels that this may be a hernia.  Says felt SOB when the pain hist. Says it just lasts seconds and then lets off.  NO change in bowel. No blood in the stool Still has her ovaries. Pain doesn't radiate. No fever or chills.  Says had similar pain about 3 months ago after lifting mulch.  Using anything for pain.  This time she says it started while she was just sitting watching television she was not bending twisting or being active at the time.   Review of Systems LMP 01/27/2000 (Approximate)     Allergies  Allergen Reactions  . Sulfonamide Derivatives Hives    REACTION: hives  . Xigduo Xr [Dapagliflozin-Metformin Hcl Er] Hives  . Hycodan [Hydrocodone-Homatropine] Itching  . Cefaclor   . Cephalexin   . Penicillins   . Tetracycline   . Victoza [Liraglutide] Other (See Comments)    Back pain  . Januvia [Sitagliptin] Nausea Only    Back pain    Past Medical History:  Diagnosis Date  . Abnormal ultrasound of ovary 2016   Ultrasound showing bilateral ovaries in contact with one another.  Asymptomatic.  [name] Allergy   . Diabetes mellitus    borderline  . Fibroid   . Heart murmur   . Hidradenitis   . Hypertension     Past Surgical History:  Procedure Laterality Date  . ABDOMINAL HYSTERECTOMY     supracervical  . BREAST EXCISIONAL BIOPSY    . BREAST SURGERY     cysts removed right breast  . CARPAL TUNNEL RELEASE Right 01/08/2017   Procedure: CARPAL TUNNEL RELEASE, POSSIBLE RIGHT THUMB CARPOMETACARPAL INJECTION;  Surgeon: Roseanne Kaufman, MD;  Location: [place];   Service: Orthopedics;  Laterality: Right;  60 MINS  . CESAREAN SECTION    . INGUINAL HIDRADENITIS EXCISION    . LEEP  05/04/13   HPV change    Social History   Socioeconomic History  . Marital status: Divorced    Spouse name: Not on file  . Number of children: Not on file  . Years of education: Not on file  . Highest education level: Not on file  Occupational History  . Not on file  Social Needs  . Financial resource strain: Not on file  . Food insecurity:    Worry: Not on file    Inability: Not on file  . Transportation needs:    Medical: Not on file    Non-medical: Not on file  Tobacco Use  . Smoking status: Never Smoker  . Smokeless tobacco: Never Used  Substance and Sexual Activity  . Alcohol use: No    Alcohol/week: 0.0 standard drinks  . Drug use: No  . Sexual activity: Not Currently    Partners: Male    Birth control/protection: Surgical    Comment: TAH/supracervical  Lifestyle  . Physical activity:    Days per week: Not on file    Minutes per session: Not on file  . Stress: Not on file  Relationships  . Social connections:    Talks  on phone: Not on file    Gets together: Not on file    Attends religious service: Not on file    Active member of club or organization: Not on file    Attends meetings of clubs or organizations: Not on file    Relationship status: Not on file  . Intimate partner violence:    Fear of current or ex partner: Not on file    Emotionally abused: Not on file    Physically abused: Not on file    Forced sexual activity: Not on file  Other Topics Concern  . Not on file  Social History Narrative  . Not on file    Family History  Problem Relation Age of Onset  . Asthma Mother   . COPD Mother   . Diabetes Mother   . Hypertension Mother   . Diabetes Father   . Hypertension Father   . Breast cancer Maternal Aunt   . Breast cancer Paternal Aunt     Outpatient Encounter Medications as of 11/29/2017  Medication Sig  . ACCU-CHEK  FASTCLIX LANCETS MISC For use when checking blood sugars up to four times daily. Dx: E11.9  . ACCU-CHEK GUIDE test strip USE TO TEST BLOOD SUGAR TWICE DAILY  . aspirin 81 MG tablet Take 81 mg by mouth daily.    . Blood Glucose Monitoring Suppl (FREESTYLE LITE) DEVI   . clonazePAM (KLONOPIN) 0.5 MG tablet Take 0.5-1 tablets (0.25-0.5 mg total) by mouth 2 (two) times daily as needed for anxiety.  . Cyanocobalamin (B-12) 500 MCG TABS Take 1 tablet by mouth daily.  . fluticasone (FLONASE) 50 MCG/ACT nasal spray Place 2 sprays into both nostrils daily.  [name] FOLIC ACID PO Take 562 mcg by mouth daily.   Elmore Guise Devices (ACCU-CHEK SOFTCLIX) lancets Use as instructed  . losartan-hydrochlorothiazide (HYZAAR) 100-25 MG tablet TAKE 1 TABLET BY MOUTH DAILY.  . metFORMIN (GLUCOPHAGE) 1000 MG tablet TAKE 1 TABLET BY MOUTH 2 TIMES DAILY WITH A MEAL.  . Multiple Vitamin (MULTIVITAMIN) tablet Take 1 tablet by mouth daily.    . NEOMYCIN-POLYMYXIN-HYDROCORTISONE (CORTISPORIN) 1 % SOLN OTIC solution Apply 1-2 drops to toe BID after soaking  . Omega-3 Fatty Acids (FISH OIL) 1000 MG CAPS Take 1,000 mg by mouth daily.   . pravastatin (PRAVACHOL) 40 MG tablet TAKE 1 TABLET BY MOUTH ONCE DAILY  . pyridOXINE (VITAMIN B-6) 100 MG tablet Take 100 mg by mouth daily.  . Zinc Sulfate (ZINC 15 PO) Take by mouth.     No facility-administered encounter medications on file as of 11/29/2017.          Objective:   Physical Exam  Constitutional: She is oriented to person, place, and time. She appears well-developed and well-nourished.  HENT:  Head: Normocephalic and atraumatic.  Eyes: Conjunctivae and EOM are normal.  Cardiovascular: Normal rate.  Pulmonary/Chest: Effort normal.  Abdominal: Soft. Bowel sounds are normal. She exhibits no distension and no mass. There is no tenderness. There is no rebound and no guarding. No hernia.  Neurological: She is alert and oriented to person, place, and time.  Skin: Skin is dry. No  pallor.  Psychiatric: She has a normal mood and affect. Her behavior is normal.  Vitals reviewed.       Assessment & Plan:  RLQ pain - sharp intermittant pain.  Will start with [place] to evaluate the ovaries since no other bowel sxs. If normal then consider CT fo further w/u.  Does have some diastases rectus on  exam but it is more midline.  Not sure if this could be contributing to her discomfort or pain was unable to re-created on exam.

## 2017-11-29 NOTE — Telephone Encounter (Signed)
Patient scheduled.

## 2017-11-30 ENCOUNTER — Encounter: Payer: Self-pay | Admitting: Family Medicine

## 2017-11-30 ENCOUNTER — Ambulatory Visit (INDEPENDENT_AMBULATORY_CARE_PROVIDER_SITE_OTHER): Payer: 59

## 2017-11-30 DIAGNOSIS — R1031 Right lower quadrant pain: Secondary | ICD-10-CM

## 2018-01-07 ENCOUNTER — Ambulatory Visit (INDEPENDENT_AMBULATORY_CARE_PROVIDER_SITE_OTHER): Payer: Self-pay | Admitting: Physician Assistant

## 2018-01-07 ENCOUNTER — Encounter: Payer: Self-pay | Admitting: Physician Assistant

## 2018-01-07 ENCOUNTER — Encounter

## 2018-01-07 VITALS — BP 124/76 | HR 87 | Temp 98.3°F | Resp 17 | Wt 171.0 lb

## 2018-01-07 DIAGNOSIS — J0100 Acute maxillary sinusitis, unspecified: Secondary | ICD-10-CM

## 2018-01-07 MED ORDER — LEVOFLOXACIN 500 MG PO TABS: 500.0000 mg | ORAL_TABLET | Freq: Every day | ORAL | 0 refills | Status: DC

## 2018-01-07 NOTE — Progress Notes (Signed)
MRN: [id number] DOB: 11/14/1960  Subjective:   Maria Nunez is a 57 y.o. female presenting for chief complaint of body aches, headache,nasal pressurex3weeks .  Reports 3 week history of worsening illness. Has sinus pressure, facial pain, sinus headache, nasal congestion, ear fullness, and body aches. Has tried OTC cold/flu medication and flonase daily for the past week with no relief. Denies fever, sore throat, wheezing, shortness of breath, chest pain,cough, nausea, vomiting, abdominal pain and diarrhea. No known sick exposures.  Has history of seasonal allergies, HTN, and well controlled T2DM (A1C 1 month ago 6.7). No history of asthma or COPD. Patient has had flu shot this season. Denies smoking. Denies any other aggravating or relieving factors, no other questions or concerns.  Review of Systems  Constitutional: Negative for diaphoresis.  Musculoskeletal: Negative for neck pain.  Skin: Negative for rash.  Neurological: Negative for dizziness, sensory change, speech change and focal weakness.    Maria Nunez has a current medication list which includes the following prescription(s): accu-chek fastclix lancets, accu-chek guide, aspirin, freestyle lite, fluticasone, folic acid, accu-chek softclix, losartan-hydrochlorothiazide, metformin, multivitamin, fish oil, pravastatin, pyridoxine, zinc sulfate, clonazepam, b-12, and neomycin-polymyxin-hydrocortisone. Also is allergic to sulfonamide derivatives; xigduo xr [dapagliflozin-metformin hcl er]; hycodan [hydrocodone-homatropine]; cefaclor; cephalexin; penicillins; tetracycline; victoza [liraglutide]; and januvia [sitagliptin].  Maria Nunez  has a past medical history of Abnormal ultrasound of ovary (2016), Allergy, Diabetes mellitus, Fibroid, Heart murmur, Hidradenitis, and Hypertension. Also  has a past surgical history that includes Cesarean section; Inguinal hidradenitis excision; Breast surgery; Abdominal hysterectomy; LEEP (05/04/13); Breast  excisional biopsy; and Carpal tunnel release (Right, 01/08/2017).   Objective:   Vitals: BP 124/76 (BP Location: Right Arm, Patient Position: Sitting, Cuff Size: Normal)   Pulse 87   Temp 98.3 F (36.8 C) (Oral)   Resp 17   Wt 171 lb (77.6 kg)   LMP 01/27/2000 (Approximate)   SpO2 97%   BMI 34.54 kg/m   Physical Exam Vitals signs reviewed.  Constitutional:      General: She is not in acute distress.    Appearance: She is well-developed. She is not toxic-appearing.  HENT:     Head: Normocephalic and atraumatic.     Right Ear: Ear canal and external ear normal. A middle ear effusion is present. Tympanic membrane is injected. Tympanic membrane is not bulging.     Left Ear: Ear canal and external ear normal. A middle ear effusion is present. Tympanic membrane is not erythematous or bulging.     Nose: Mucosal edema and congestion present.     Right Turbinates: Swollen.     Left Turbinates: Swollen.     Right Sinus: Maxillary sinus tenderness present. No frontal sinus tenderness.     Left Sinus: Maxillary sinus tenderness present. No frontal sinus tenderness.     Mouth/Throat:     Lips: Pink.     Mouth: Mucous membranes are moist.     Dentition: Normal dentition. No dental tenderness.     Pharynx: Oropharynx is clear. Uvula midline. No posterior oropharyngeal erythema.     Tonsils: No tonsillar exudate or tonsillar abscesses.  Eyes:     Conjunctiva/sclera: Conjunctivae normal.     Pupils: Pupils are equal, round, and reactive to light.  Neck:     Musculoskeletal: Normal range of motion.  Cardiovascular:     Rate and Rhythm: Normal rate and regular rhythm.     Heart sounds: Murmur present. Systolic (RUSB) murmur present.  Pulmonary:     Effort: Pulmonary effort is normal.  Breath sounds: Normal breath sounds. No decreased breath sounds, wheezing, rhonchi or rales.  Lymphadenopathy:     Head:     Right side of head: No submental, submandibular, tonsillar, preauricular,  posterior auricular or occipital adenopathy.     Left side of head: No submental, submandibular, tonsillar, preauricular, posterior auricular or occipital adenopathy.     Cervical: No cervical adenopathy.     Upper Body:     Right upper body: No supraclavicular adenopathy.     Left upper body: No supraclavicular adenopathy.  Skin:    General: Skin is warm and dry.  Neurological:     Mental Status: She is alert and oriented to person, place, and time.     No results found for this or any previous visit (from the past 24 hour(s)).  Assessment and Plan :  1. Acute non-recurrent maxillary sinusitis Due to hx,duration, PE findings, and no improvement with conservative tx, will treat with oral abx at this time for bacterial sinusitis. Rec continuing supportive care. Pt has reported medication allergies to PCN, cephalosporins, and tetracyclines. Therefore, given Rx for levaquin. Will use lowest effective dose. Encouraged to f/u with PCP if no improvement with tx plan. Seek care at urgent care/ED if symptoms worsen or she develops new concerning sx.  - levofloxacin (LEVAQUIN) 500 MG tablet; Take 1 tablet (500 mg total) by mouth daily.  Dispense: 5 tablet; Refill: 0  Side effects, risks, benefits, and alternatives of the medications and treatment plan prescribed today were discussed, and patient expressed understanding of the instructions given. No barriers to understanding were identified. Red flags discussed in detail. Pt expressed understanding regarding what to do in case of emergency/urgent symptoms.   Tenna Delaine, Hershal Coria  [facility] 01/07/2018 3:39 PM

## 2018-01-07 NOTE — Patient Instructions (Signed)
Sinusitis, Adult  -Take antibiotics as prescribed. Avoid heavy lifting while on this antibiotic. Continue with symptomatic treatment. Follow up with your PCP if no improvement with treatment plan or if any of your symptoms worsen.   Sinusitis is soreness and inflammation of your sinuses. Sinuses are hollow spaces in the bones around your face. They are located:  Around your eyes.  In the middle of your forehead.  Behind your nose.  In your cheekbones.  Your sinuses and nasal passages are lined with a stringy fluid (mucus). Mucus normally drains out of your sinuses. When your nasal tissues get inflamed or swollen, the mucus can get trapped or blocked so air cannot flow through your sinuses. This lets bacteria, viruses, and funguses grow, and that leads to infection. Follow these instructions at home: Medicines  Take, use, or apply over-the-counter and prescription medicines only as told by your doctor. These may include nasal sprays.  If you were prescribed an antibiotic medicine, take it as told by your doctor. Do not stop taking the antibiotic even if you start to feel better. Hydrate and Humidify  Drink enough water to keep your pee (urine) clear or pale yellow.  Use a cool mist humidifier to keep the humidity level in your home above 50%.  Breathe in steam for 10-15 minutes, 3-4 times a day or as told by your doctor. You can do this in the bathroom while a hot shower is running.  Try not to spend time in cool or dry air. Rest  Rest as much as possible.  Sleep with your head raised (elevated).  Make sure to get enough sleep each night. General instructions  Put a warm, moist washcloth on your face 3-4 times a day or as told by your doctor. This will help with discomfort.  Wash your hands often with soap and water. If there is no soap and water, use hand sanitizer.  Do not smoke. Avoid being around people who are smoking (secondhand smoke).  Keep all follow-up visits as  told by your doctor. This is important. Contact a doctor if:  You have a fever.  Your symptoms get worse.  Your symptoms do not get better within 10 days. Get help right away if:  You have a very bad headache.  You cannot stop throwing up (vomiting).  You have pain or swelling around your face or eyes.  You have trouble seeing.  You feel confused.  Your neck is stiff.  You have trouble breathing. This information is not intended to replace advice given to you by your health care provider. Make sure you discuss any questions you have with your health care provider. Document Released: 07/01/2007 Document Revised: 09/08/2015 Document Reviewed: 11/07/2014 Elsevier Interactive Patient Education  2018 [employer]. Levofloxacin tablets O que  este medicamento? O LEVOFLOXACINO  um antibitico tipo quinolona.  usada para tratar alguns tipos de infeces bacterianas. No tem efeito no tratamento de resfriados, gripes ou outras viroses. Este medicamento pode ser usado para outros propsitos; em caso de dvidas, pergunte ao seu profissional de sade ou farmacutico. NOMES DE MARCAS COMUNS: Levaquin, Levaquin Leva-Pak O que devo dizer a meu profissional de sade antes de tomar este medicamento? Precisam saber se voc tem algum dos seguintes problemas ou estados de sade: -problemas nos ossos -diabetes -histrico de baixos nveis de potssio no sangue -batimento cardaco irregular -problemas nas articulaes -doenas renais -doenas hepticas -miastenia grave -convulses (crises convulsivas) -problemas nos tendes -formigamento nos dedos das mos ou dos ps ou  outro [employer] -reao estranha ou alergia ao levofloxacino ou a outros antibiticos tipo quinolona -reao estranha ou alergia a alimentos, corantes ou conservantes -est grvida ou tentando Hospital doctor -est [employer] [place] devo usar este medicamento? [employer] medicamento por via oral com um copo cheio  d'gua. Siga as instrues na embalagem ou na bula. Voc pode tomar este medicamento com ou sem comida. Tome este medicamento em intervalos regulares. No tome este medicamento com frequncia maior do que a indicada. No pule nenhuma dose ou pare de tomar o medicamento antes da hora, mesmo se estiver se sentindo Sports coach. No pare de usar [employer] a menos que seu mdico [place]. O farmacutico lhe dar um folheto informativo especial a cada compra do medicamento. No se esquea de ler atentamente essas informaes todas as vezes. Fale com seu pediatra a respeito do uso deste medicamento em crianas. Embora este medicamento possa ser receitado para crianas a [employer] 6 meses de idade para certas condies, algumas precaues so necessrias. Superdosagem: Se achar que tomou uma superdosagem deste medicamento, entre em contato imediatamente com o [facility] ou v a [employer]. OBSERVAO: Este medicamento  s para voc. No compartilhe este medicamento com outras pessoas. E se eu deixar de tomar uma dose? Se perder uma dose, tome-a assim que possvel. Se j estiver quase na hora da sua prxima dose, tome somente essa dose. No tome o remdio em dobro, nem tome uma dose adicional. O que pode interagir com este medicamento? No tome este medicamento com nenhum dos seguintes: -bepridil -alguns medicamentos para depresso, ansiedade ou transtornos psicticos, como pimozida, tioridazina e ziprasidona -alguns medicamentos para arritmia cardaca, como dofetilida e dronedarona -cisaprida -halofantrina Este medicamento tambm pode interagir com os seguintes remdios: -anticidos -plulas anticoncepcionais -alguns medicamentos para diabetes, como glipizida, glibenclamida ou insulina -didanosina em comprimidos tamponados ou p -multivitamnicos -AINEs, medicamentos analgsicos e anti-inflamatrios, como ibuprofeno, naproxeno -corticoides, como prednisona ou  cortisona -sucralfato -teofilina -varfarina Esta lista pode no descrever todas as interaes possveis. D ao seu profissional de sade uma lista de todos os medicamentos, ervas medicinais, remdios de venda livre, ou suplementos alimentares que voc [place]. Diga tambm se voc fuma, bebe, ou [place] drogas ilcitas. Alguns destes podem interagir com o seu medicamento. Ao que devo ficar atento quando estiver [employer] medicamento? Avise seu mdico ou profissional de sade se os seus sintomas no melhorarem ou se piorarem. No trate a diarreia com medicamentos vendidos sem receita. Entre em contato com seu mdico se tiver diarreia por mais de 2 dias ou se a diarreia for forte e aguada. Se tiver uma crise grave de diarreia, enjoo e vmitos ou se estiver suando em demasia, entre em contato com seu mdico ou profissional de sade. Tomar este medicamento pode ser perigoso se tiver perdido [employer] lquido. Voc pode sentir sonolncia ou tontura. No dirija, no opere mquinas e no faa nada que exija concentrao mental at [employer] como o medicamento lhe afeta. No se sente nem se levante rpido demais, principalmente se for um paciente idoso. Isso diminui o risco de [place] ou desmaio. Este medicamento pode aumentar sua sensibilidade ao sol. No tome sol. Se no puder evitar a exposio ao sol, use roupas protetoras e protetor solar. No use lmpadas de luz solar e no faa bronzeamento artificial. Se se queimar no sol, entre em contato com seu mdico. Se tiver diabetes, monitore sua glicemia atentamente. Em caso de resultado fora do comum, pare de tomar este medicamento e [employer]  em contato com seu mdico ou profissional de sade imediatamente. Evite tomar anticidos ou produtos com clcio, ferro e zinco 2 horas antes ou 2 horas depois de tomar [employer]. Que efeitos colaterais posso sentir aps usar este medicamento? Efeitos colaterais que devem ser informados ao seu mdico ou profissional de  sade o mais rpido possvel: -reaes alrgicas, como erupo na pele, coceira, urticria, ou inchao do rosto, dos lbios ou da lngua -sensao de ansiedade -dificuldade para respirar -confuso -deprimido -diarreia -tontura -batimento cardaco acelerado ou irregular -alucinao, perda do contato com a realidade -dor ou inchao nas articulaes, msculos ou tendes -fraqueza muscular -dor, dormncia ou formigamento nas mos ou ps -convulses (crises convulsivas) -sinais e sintomas de hiperglicemia, tais como tonturas; boca seca; pele seca; hlito com odor frutado; nuseas; dor de estmago; aumento da fome ou sede; Nurse, adult mico -sinais e sintomas de leso heptica, tais como urina amarela escura ou marrom; mal-estar geral ou sintomas de gripe; fezes de cor clara; perda de apetite; nuseas; dor no lado superior direito da barriga; cansao ou fraqueza fora do comum; amarelecimento [employer] ou da pele -sinais e sintomas de hipoglicemia, tais como ansiedade, confuso, tonturas, [employer], fraqueza ou cansao anormais, sudorese, tremores, frio, Teaching laboratory technician, cefaleia, viso turva, batimento cardaco acelerado, perda de conscincia -pensamentos suicidas ou outras alteraes do humor -queimadura de sol -fraqueza ou cansao fora do comum Efeitos colaterais que normalmente no precisam de cuidados mdicos (avise ao seu mdico ou profissional de sade se persistirem ou forem incmodos): -priso de ventre -boca seca -dor de cabea -enjoo ou vmitos -dificuldade para dormir Esta lista pode no descrever todos os efeitos colaterais possveis. Para mais orientaes sobre efeitos colaterais, consulte o seu mdico. Voc pode relatar a ocorrncia de efeitos colaterais  FDA pelo telefone [phone]. Onde devo guardar meu medicamento? [facility] fora do [employer]. Conservar em temperatura ambiente, entre 15 e 30 degreesC [phone] e 86 degreesF). Manter o recipiente bem fechado. Descartar  qualquer medicamento no utilizado aps a data de validade impressa no rtulo ou embalagem. OBSERVAO: Este folheto  um resumo. Pode no cobrir todas as informaes possveis. Se tiver dvidas a respeito deste medicamento, fale com seu mdico, farmacutico ou profissional de sade.  2018 Elsevier/Gold Standard (2016-02-13 00:00:00)

## 2018-01-10 MED FILL — PRAVASTATIN NA 40 MG TAB: 40 | 90 days supply | Qty: 90 | Fill #2

## 2018-01-10 MED FILL — metFORMIN HCL 1000 MG TABS: 1000 | 90 days supply | Qty: 180 | Fill #2

## 2018-01-10 MED FILL — ACCU-CHEK GUIDE STRP: 50 days supply | Qty: 100 | Fill #2

## 2018-01-11 ENCOUNTER — Other Ambulatory Visit: Payer: Self-pay | Admitting: *Deleted

## 2018-01-11 ENCOUNTER — Telehealth: Payer: Self-pay | Admitting: Family Medicine

## 2018-01-11 ENCOUNTER — Encounter: Payer: Self-pay | Admitting: Family Medicine

## 2018-01-11 DIAGNOSIS — I10 Essential (primary) hypertension: Secondary | ICD-10-CM

## 2018-01-11 MED ORDER — HYDROCHLOROTHIAZIDE 25 MG PO TABS: 25.0000 mg | ORAL_TABLET | Freq: Every day | ORAL | 1 refills | Status: DC

## 2018-01-11 MED ORDER — LOSARTAN POTASSIUM 100 MG PO TABS: 100.0000 mg | ORAL_TABLET | Freq: Every day | ORAL | 1 refills | Status: DC

## 2018-01-11 MED FILL — HYDROCHLOROTHIAZIDE 25 MG T: 25 | 90 days supply | Qty: 90 | Fill #0

## 2018-01-11 MED FILL — LOSARTAN POTASSIUM 100 MG T: 100 | 90 days supply | Qty: 90 | Fill #0

## 2018-01-11 NOTE — Telephone Encounter (Signed)
Patient called in to let staff known that she was seen last week and did not tolerate the antibiotic well. She was additionally concerned about dark colored urine- she inquired if this location could perform any testing to identify the cause. Discussed with patient likely diagnosis of hematuria could be detected on urinalysis capabilities we have at this location but any additional lab testing, imaging or supportive diagnostic capabilities are not available at this location. Patient has multiple abx allergies and she was wondering if this could also be a side effect of the medication. At this point patient was advised to seek treatment at a higher level of care due to the limited scope of services at this location. Recommended [facility] today or PCP in the AM. Patient verbalized understanding.

## 2018-01-11 NOTE — Telephone Encounter (Signed)
Medication is on backorder. Will need separate rx for both of these.Maria Nunez, Maria Nunez, CMA

## 2018-01-25 DIAGNOSIS — M545 Low back pain: Secondary | ICD-10-CM | POA: Diagnosis not present

## 2018-01-25 DIAGNOSIS — M5412 Radiculopathy, cervical region: Secondary | ICD-10-CM | POA: Diagnosis not present

## 2018-01-25 DIAGNOSIS — M542 Cervicalgia: Secondary | ICD-10-CM | POA: Diagnosis not present

## 2018-01-25 DIAGNOSIS — M62830 Muscle spasm of back: Secondary | ICD-10-CM | POA: Diagnosis not present

## 2018-01-26 HISTORY — PX: BREAST EXCISIONAL BIOPSY: SUR124

## 2018-01-29 DIAGNOSIS — M542 Cervicalgia: Secondary | ICD-10-CM | POA: Diagnosis not present

## 2018-01-29 DIAGNOSIS — M5412 Radiculopathy, cervical region: Secondary | ICD-10-CM | POA: Diagnosis not present

## 2018-01-29 DIAGNOSIS — M62830 Muscle spasm of back: Secondary | ICD-10-CM | POA: Diagnosis not present

## 2018-01-29 DIAGNOSIS — M545 Low back pain: Secondary | ICD-10-CM | POA: Diagnosis not present

## 2018-02-05 DIAGNOSIS — M62830 Muscle spasm of back: Secondary | ICD-10-CM | POA: Diagnosis not present

## 2018-02-05 DIAGNOSIS — M5412 Radiculopathy, cervical region: Secondary | ICD-10-CM | POA: Diagnosis not present

## 2018-02-05 DIAGNOSIS — M542 Cervicalgia: Secondary | ICD-10-CM | POA: Diagnosis not present

## 2018-02-05 DIAGNOSIS — M545 Low back pain: Secondary | ICD-10-CM | POA: Diagnosis not present

## 2018-02-21 DIAGNOSIS — M79641 Pain in right hand: Secondary | ICD-10-CM | POA: Insufficient documentation

## 2018-02-21 DIAGNOSIS — M1811 Unilateral primary osteoarthritis of first carpometacarpal joint, right hand: Secondary | ICD-10-CM | POA: Diagnosis not present

## 2018-02-21 DIAGNOSIS — G5601 Carpal tunnel syndrome, right upper limb: Secondary | ICD-10-CM | POA: Diagnosis not present

## 2018-02-21 DIAGNOSIS — M189 Osteoarthritis of first carpometacarpal joint, unspecified: Secondary | ICD-10-CM | POA: Diagnosis not present

## 2018-02-24 ENCOUNTER — Ambulatory Visit: Payer: 59 | Admitting: Family Medicine

## 2018-03-03 ENCOUNTER — Ambulatory Visit: Payer: 59 | Admitting: Family Medicine

## 2018-03-25 ENCOUNTER — Ambulatory Visit: Payer: 59 | Admitting: Obstetrics and Gynecology

## 2018-03-28 ENCOUNTER — Ambulatory Visit: Payer: 59 | Admitting: Family Medicine

## 2018-03-28 ENCOUNTER — Encounter: Payer: Self-pay | Admitting: Family Medicine

## 2018-03-28 VITALS — BP 136/52 | HR 75 | Ht 59.0 in | Wt 168.0 lb

## 2018-03-28 DIAGNOSIS — E7800 Pure hypercholesterolemia, unspecified: Secondary | ICD-10-CM | POA: Diagnosis not present

## 2018-03-28 DIAGNOSIS — I10 Essential (primary) hypertension: Secondary | ICD-10-CM

## 2018-03-28 DIAGNOSIS — E119 Type 2 diabetes mellitus without complications: Secondary | ICD-10-CM | POA: Diagnosis not present

## 2018-03-28 LAB — POCT GLYCOSYLATED HEMOGLOBIN (HGB A1C): Hemoglobin A1C: 6.9 % — AB (ref 4.0–5.6)

## 2018-03-28 MED ORDER — LINAGLIPTIN 5 MG PO TABS: 5.0000 mg | ORAL_TABLET | Freq: Every day | ORAL | 2 refills | Status: DC

## 2018-03-28 NOTE — Progress Notes (Signed)
Subjective:    CC: DM and BP.   HPI: Diabetes - no hypoglycemic events. No wounds or sores that are not healing well. No increased thirst or urination. Checking glucose at home. Taking medications as prescribed without any side effects.  Hypertension- Pt denies chest pain, SOB, dizziness, or heart palpitations.  Taking meds as directed w/o problems.  Denies medication side effects.    Hyperlipidemia-only on pravastatin 40 mg.  Tolerating well without any side effects or problems.  Has a "stomach bug" recently that last more than a week.  Did have some right-sided back pain with it but says she is feeling better.  Past medical history, Surgical history, Family history not pertinant except as noted below, Social history, Allergies, and medications have been entered into the medical record, reviewed, and corrections made.   Review of Systems: No fevers, chills, night sweats, weight loss, chest pain, or shortness of breath.   Objective:    General: Well Developed, well nourished, and in no acute distress.  Neuro: Alert and oriented x3, extra-ocular muscles intact, sensation grossly intact.  HEENT: Normocephalic, atraumatic  Skin: Warm and dry, no rashes. Cardiac: Regular rate and rhythm, no murmurs rubs or gallops, no lower extremity edema.  Respiratory: Clear to auscultation bilaterally. Not using accessory muscles, speaking in full sentences.   Impression and Recommendations:    DM -A1c of 6.9 today.  Borderline.  Discussed options.  She is tried a couple of different agents including Victoza, Januvia, and Onglyza.  We will try Tradjenta.  Scription sent to pharmacy.  Initially we will keep it separated from the metformin in case she does not tolerate it.  But if she does then we can plan on combining these 2.  Otherwise I will see her back in 3 to 4 months.  HTN - Well controlled. Continue current regimen. Follow up in  6 months.  Due for labs.    Hyperlipidemia-due to recheck lipid  panel.

## 2018-03-29 LAB — LIPID PANEL
Cholesterol: 158 mg/dL (ref <200)
HDL: 49 mg/dL — ABNORMAL LOW (ref >=50)
LDL Cholesterol (Calc): 84 mg/dL (calc)
Non-HDL Cholesterol (Calc): 109 mg/dL (calc) (ref <130)
Total CHOL/HDL Ratio: 3.2 (calc) (ref <5.0)
Triglycerides: 146 mg/dL (ref <150)

## 2018-03-29 LAB — COMPLETE METABOLIC PANEL WITH GFR
AG Ratio: 2.1 (calc) (ref 1.0–2.5)
ALT: 41 U/L — ABNORMAL HIGH (ref 6–29)
AST: 27 U/L (ref 10–35)
Albumin: 4.6 g/dL (ref 3.6–5.1)
Alkaline phosphatase (APISO): 68 U/L (ref 37–153)
BUN: 12 mg/dL (ref 7–25)
CO2: 26 mmol/L (ref 20–32)
Calcium: 10.7 mg/dL — ABNORMAL HIGH (ref 8.6–10.4)
Chloride: 100 mmol/L (ref 98–110)
Creat: 0.63 mg/dL (ref 0.50–1.05)
GFR, Est African American: 115 mL/min/{1.73_m2} (ref >=60)
GFR, Est Non African American: 100 mL/min/{1.73_m2} (ref >=60)
Globulin: 2.2 g/dL (calc) (ref 1.9–3.7)
Glucose, Bld: 131 mg/dL — ABNORMAL HIGH (ref 65–99)
Potassium: 4 mmol/L (ref 3.5–5.3)
Sodium: 140 mmol/L (ref 135–146)
Total Bilirubin: 0.4 mg/dL (ref 0.2–1.2)
Total Protein: 6.8 g/dL (ref 6.1–8.1)

## 2018-03-30 MED FILL — PRAVASTATIN NA 40 MG TAB: 40 | 90 days supply | Qty: 90 | Fill #3

## 2018-03-30 MED FILL — LOSARTAN-HCTZ 100-25 MG TAB: 100-25 | 90 days supply | Qty: 90 | Fill #2

## 2018-03-30 MED FILL — metFORMIN HCL 1000 MG TABS: 1000 | 90 days supply | Qty: 180 | Fill #3

## 2018-03-31 MED FILL — TRADJENTA 5 MG TABLET: 5 | 30 days supply | Qty: 30 | Fill #0 | Status: TO

## 2018-04-02 DIAGNOSIS — M62830 Muscle spasm of back: Secondary | ICD-10-CM | POA: Diagnosis not present

## 2018-04-02 DIAGNOSIS — M546 Pain in thoracic spine: Secondary | ICD-10-CM | POA: Diagnosis not present

## 2018-04-02 DIAGNOSIS — M545 Low back pain: Secondary | ICD-10-CM | POA: Diagnosis not present

## 2018-04-02 DIAGNOSIS — M542 Cervicalgia: Secondary | ICD-10-CM | POA: Diagnosis not present

## 2018-04-04 DIAGNOSIS — M546 Pain in thoracic spine: Secondary | ICD-10-CM | POA: Diagnosis not present

## 2018-04-04 DIAGNOSIS — M545 Low back pain: Secondary | ICD-10-CM | POA: Diagnosis not present

## 2018-04-04 DIAGNOSIS — M62830 Muscle spasm of back: Secondary | ICD-10-CM | POA: Diagnosis not present

## 2018-04-04 DIAGNOSIS — M542 Cervicalgia: Secondary | ICD-10-CM | POA: Diagnosis not present

## 2018-04-29 ENCOUNTER — Other Ambulatory Visit: Payer: Self-pay

## 2018-04-29 ENCOUNTER — Encounter: Payer: Self-pay | Admitting: Podiatry

## 2018-04-29 ENCOUNTER — Ambulatory Visit: Payer: 59 | Admitting: Podiatry

## 2018-04-29 VITALS — Temp 98.1°F

## 2018-04-29 DIAGNOSIS — L600 Ingrowing nail: Secondary | ICD-10-CM

## 2018-04-29 NOTE — Progress Notes (Signed)
Subjective:  Patient ID: Maria Nunez, female    DOB: [birth year],  MRN: [id number]  Chief Complaint  Patient presents with  . Nail Problem    Hallux nail right - patient had ingrown procedure on 11/11/17 and now concerned about the nail splitting    58 y.o. female presents with the above complaint. Hx above confirmed with patient.   Review of Systems: Negative except as noted in the HPI. Denies N/V/F/Ch.  Past Medical History:  Diagnosis Date  . Abnormal ultrasound of ovary 2016   Ultrasound showing bilateral ovaries in contact with one another.  Asymptomatic.  [name] Allergy   . Diabetes mellitus    borderline  . Fibroid   . Heart murmur   . Hidradenitis   . Hypertension     Current Outpatient Medications:  .  ACCU-CHEK FASTCLIX LANCETS MISC, For use when checking blood sugars up to four times daily. Dx: E11.9, Disp: 4 each, Rfl: 11 .  ACCU-CHEK GUIDE test strip, USE TO TEST BLOOD SUGAR TWICE DAILY, Disp: 100 each, Rfl: 12 .  aspirin 81 MG tablet, Take 81 mg by mouth daily.  , Disp: , Rfl:  .  Blood Glucose Monitoring Suppl (FREESTYLE LITE) DEVI, , Disp: , Rfl: 0 .  Cyanocobalamin (B-12) 500 MCG TABS, Take 1 tablet by mouth daily., Disp: , Rfl:  .  fluticasone (FLONASE) 50 MCG/ACT nasal spray, Place 2 sprays into both nostrils daily., Disp: 16 g, Rfl: 6 .  FOLIC ACID PO, Take 782 mcg by mouth daily. , Disp: , Rfl:  .  hydrochlorothiazide (HYDRODIURIL) 25 MG tablet, Take 1 tablet (25 mg total) by mouth daily., Disp: 90 tablet, Rfl: 1 .  linagliptin (TRADJENTA) 5 MG TABS tablet, Take 1 tablet (5 mg total) by mouth daily., Disp: 30 tablet, Rfl: 2 .  losartan (COZAAR) 100 MG tablet, Take 1 tablet (100 mg total) by mouth daily., Disp: 90 tablet, Rfl: 1 .  losartan-hydrochlorothiazide (HYZAAR) 100-25 MG tablet, , Disp: , Rfl:  .  metFORMIN (GLUCOPHAGE) 1000 MG tablet, TAKE 1 TABLET BY MOUTH 2 TIMES DAILY WITH A MEAL., Disp: 180 tablet, Rfl: 3 .  Multiple Vitamin (MULTIVITAMIN)  tablet, Take 1 tablet by mouth daily.  , Disp: , Rfl:  .  Omega-3 Fatty Acids (FISH OIL) 1000 MG CAPS, Take 1,000 mg by mouth daily. , Disp: , Rfl:  .  pravastatin (PRAVACHOL) 40 MG tablet, TAKE 1 TABLET BY MOUTH ONCE DAILY, Disp: 90 tablet, Rfl: 3 .  pyridOXINE (VITAMIN B-6) 100 MG tablet, Take 100 mg by mouth daily., Disp: , Rfl:  .  Zinc Sulfate (ZINC 15 PO), Take by mouth.  , Disp: , Rfl:   Social History   Tobacco Use  Smoking Status Never Smoker  Smokeless Tobacco Never Used    Allergies  Allergen Reactions  . Sulfonamide Derivatives Hives    REACTION: hives  . Xigduo Xr [Dapagliflozin-Metformin Hcl Er] Hives  . Hycodan [Hydrocodone-Homatropine] Itching  . Cefaclor   . Cephalexin   . Penicillins   . Tetracycline   . Victoza [Liraglutide] Other (See Comments)    Back pain  . Januvia [Sitagliptin] Nausea Only    Back pain  . Levofloxacin Itching and Rash   Objective:  Vitals:   04/29/18 0842  Temp: 98.1 F (36.7 C)   There is no height or weight on file to calculate BMI. Constitutional Well developed. Well nourished.  Vascular Dorsalis pedis pulses palpable bilaterally. Posterior tibial pulses palpable bilaterally. Capillary refill normal to  all digits.  No cyanosis or clubbing noted. Pedal hair growth normal.  Neurologic Normal speech. Oriented to person, place, and time. Epicritic sensation to light touch grossly present bilaterally.  Dermatologic Nails left hallux nail splitting No open wounds. No skin lesions.  Orthopedic: Normal joint ROM without pain or crepitus bilaterally. No visible deformities. No bony tenderness.   Radiographs: None Assessment:  No diagnosis found. Plan:  Patient was evaluated and treated and all questions answered.  Onychomycosis -Educated on etiology of nail fungus. -Nail sample taken for microbiology and histology. -eRx for weekly fluconazole. Educated on use. -Baseline liver function studies ordered. Will d/c  terbinafine if elevated during therapy. -eRx for oral terbinafine #30. Educated on risks and benefits of the medication.  No follow-ups on file.

## 2018-05-05 ENCOUNTER — Encounter: Payer: Self-pay | Admitting: Sports Medicine

## 2018-05-09 ENCOUNTER — Encounter: Payer: Self-pay | Admitting: Sports Medicine

## 2018-05-09 ENCOUNTER — Ambulatory Visit: Payer: 59 | Admitting: Sports Medicine

## 2018-05-09 DIAGNOSIS — M1811 Unilateral primary osteoarthritis of first carpometacarpal joint, right hand: Secondary | ICD-10-CM

## 2018-05-09 MED ORDER — CELECOXIB 200 MG PO CAPS: ORAL_CAPSULE | ORAL | 2 refills | Status: DC

## 2018-05-09 MED FILL — TRADJENTA 5 MG TABLET: 5 | 30 days supply | Qty: 30 | Fill #0

## 2018-05-09 NOTE — Assessment & Plan Note (Signed)
Repeat right first [place] injection today. Last injection was in January with Dr. Amedeo Plenty, prior to that it was September with [place]. Adding hand physical therapy, Celebrex, return to see me as needed.

## 2018-05-09 NOTE — Progress Notes (Signed)
Subjective:    CC: Right hand pain  HPI: This is a pleasant 58 year old female with right carpometacarpal osteoarthritis, previous injection was done by Dr. Amedeo Plenty in January, and previously by [place] with ultrasound guidance in September.  Pain is recurrent, localized without radiation, moderate, persistent.  She is eager to do some hand physical therapy as well.  I reviewed the past medical history, family history, social history, surgical history, and allergies today and no changes were needed.  Please see the problem list section below in epic for further details.  Past Medical History: Past Medical History:  Diagnosis Date  . Abnormal ultrasound of ovary 2016   Ultrasound showing bilateral ovaries in contact with one another.  Asymptomatic.  [name] Allergy   . Diabetes mellitus    borderline  . Fibroid   . Heart murmur   . Hidradenitis   . Hypertension    Past Surgical History: Past Surgical History:  Procedure Laterality Date  . ABDOMINAL HYSTERECTOMY     supracervical  . BREAST EXCISIONAL BIOPSY    . BREAST SURGERY     cysts removed right breast  . CARPAL TUNNEL RELEASE Right 01/08/2017   Procedure: CARPAL TUNNEL RELEASE, POSSIBLE RIGHT THUMB CARPOMETACARPAL INJECTION;  Surgeon: Roseanne Kaufman, MD;  Location: [facility];  Service: Orthopedics;  Laterality: Right;  60 MINS  . CESAREAN SECTION    . INGUINAL HIDRADENITIS EXCISION    . LEEP  05/04/13   HPV change   Social History: Social History   Socioeconomic History  . Marital status: Divorced    Spouse name: Not on file  . Number of children: Not on file  . Years of education: Not on file  . Highest education level: Not on file  Occupational History  . Not on file  Social Needs  . Financial resource strain: Not on file  . Food insecurity:    Worry: Not on file    Inability: Not on file  . Transportation needs:    Medical: Not on file    Non-medical: Not on file  Tobacco Use  . Smoking status:  Never Smoker  . Smokeless tobacco: Never Used  Substance and Sexual Activity  . Alcohol use: No    Alcohol/week: 0.0 standard drinks  . Drug use: No  . Sexual activity: Not Currently    Partners: Male    Birth control/protection: Surgical    Comment: TAH/supracervical  Lifestyle  . Physical activity:    Days per week: Not on file    Minutes per session: Not on file  . Stress: Not on file  Relationships  . Social connections:    Talks on phone: Not on file    Gets together: Not on file    Attends religious service: Not on file    Active member of club or organization: Not on file    Attends meetings of clubs or organizations: Not on file    Relationship status: Not on file  Other Topics Concern  . Not on file  Social History Narrative  . Not on file   Family History: Family History  Problem Relation Age of Onset  . Asthma Mother   . COPD Mother   . Diabetes Mother   . Hypertension Mother   . Diabetes Father   . Hypertension Father   . Breast cancer Maternal Aunt   . Breast cancer Paternal Aunt    Allergies: Allergies  Allergen Reactions  . Sulfonamide Derivatives Hives    REACTION: hives  .  Xigduo Xr [Dapagliflozin-Metformin Hcl Er] Hives  . Hycodan [Hydrocodone-Homatropine] Itching  . Cefaclor   . Cephalexin   . Penicillins   . Tetracycline   . Victoza [Liraglutide] Other (See Comments)    Back pain  . Januvia [Sitagliptin] Nausea Only    Back pain  . Levofloxacin Itching and Rash   Medications: See med rec.  Review of Systems: No fevers, chills, night sweats, weight loss, chest pain, or shortness of breath.   Objective:    General: Well Developed, well nourished, and in no acute distress.  Neuro: Alert and oriented x3, extra-ocular muscles intact, sensation grossly intact.  HEENT: Normocephalic, atraumatic, pupils equal round reactive to light, neck supple, no masses, no lymphadenopathy, thyroid nonpalpable.  Skin: Warm and dry, no rashes. Cardiac:  Regular rate and rhythm, no murmurs rubs or gallops, no lower extremity edema.  Respiratory: Clear to auscultation bilaterally. Not using accessory muscles, speaking in full sentences.  Procedure: Real-time Ultrasound Guided injection of the right first carpometacarpal joint Device: GE Logiq E  Verbal informed consent obtained.  Time-out conducted.  Noted no overlying erythema, induration, or other signs of local infection.  Skin prepped in a sterile fashion.  Local anesthesia: Topical Ethyl chloride.  With sterile technique and under real time ultrasound guidance:  25-gauge needle advanced between the first metacarpal base and the trapezium, I then injected 1/2 cc Kenalog 40, 1/2 cc lidocaine. Completed without difficulty  Pain immediately resolved suggesting accurate placement of the medication.  Advised to call if fevers/chills, erythema, induration, drainage, or persistent bleeding.  Images permanently stored and available for review in the ultrasound unit.  Impression: Technically successful ultrasound guided injection.  Impression and Recommendations:    Localized primary osteoarthritis of carpometacarpal joint of right thumb Repeat right first [facility] injection today. Last injection was in January with Dr. Amedeo Plenty, prior to that it was September with [place]. Adding hand physical therapy, Celebrex, return to see me as needed.   ___________________________________________ Gwen Her. Dianah Field, M.D., ABFM., CAQSM. Primary Care and Sports Medicine [facility]  Adjunct Professor of [employer] [employer]

## 2018-05-12 DIAGNOSIS — M25631 Stiffness of right wrist, not elsewhere classified: Secondary | ICD-10-CM | POA: Diagnosis not present

## 2018-05-12 DIAGNOSIS — M6281 Muscle weakness (generalized): Secondary | ICD-10-CM | POA: Diagnosis not present

## 2018-05-12 DIAGNOSIS — M25531 Pain in right wrist: Secondary | ICD-10-CM | POA: Diagnosis not present

## 2018-05-24 DIAGNOSIS — M6281 Muscle weakness (generalized): Secondary | ICD-10-CM | POA: Diagnosis not present

## 2018-05-24 DIAGNOSIS — M25631 Stiffness of right wrist, not elsewhere classified: Secondary | ICD-10-CM | POA: Diagnosis not present

## 2018-05-24 DIAGNOSIS — M25531 Pain in right wrist: Secondary | ICD-10-CM | POA: Diagnosis not present

## 2018-06-01 MED FILL — ACCU-CHEK GUIDE STRP: 50 days supply | Qty: 100 | Fill #0

## 2018-06-02 ENCOUNTER — Encounter: Payer: 59 | Admitting: Family Medicine

## 2018-06-02 ENCOUNTER — Ambulatory Visit: Payer: 59 | Admitting: Obstetrics and Gynecology

## 2018-06-03 ENCOUNTER — Telehealth: Payer: 59 | Admitting: Family

## 2018-06-03 DIAGNOSIS — J029 Acute pharyngitis, unspecified: Secondary | ICD-10-CM

## 2018-06-03 NOTE — Progress Notes (Signed)
Thank you for the details you included in the comment boxes. Those details are very helpful in determining the best course of treatment for you and help us to provide the best care.  We are sorry that you are not feeling well.  Here is how we plan to help!  Your symptoms indicate a likely viral infection (Pharyngitis).   Pharyngitis is inflammation in the back of the throat which can cause a sore throat, scratchiness and sometimes difficulty swallowing.   Pharyngitis is typically caused by a respiratory virus and will just run its course.  Please keep in mind that your symptoms could last up to 10 days.  For throat pain, we recommend over the counter oral pain relief medications such as acetaminophen or aspirin, or anti-inflammatory medications such as ibuprofen or naproxen sodium.  Topical treatments such as oral throat lozenges or sprays may be used as needed.  Avoid close contact with loved ones, especially the very young and elderly.  Remember to wash your hands thoroughly throughout the day as this is the number one way to prevent the spread of infection and wipe down door knobs and counters with disinfectant.  After careful review of your answers, I would not recommend and antibiotic for your condition.  Antibiotics should not be used to treat conditions that we suspect are caused by viruses like the virus that causes the common cold or flu. However, some people can have Strep with atypical symptoms. You may need formal testing in clinic or office to confirm if your symptoms continue or worsen.  Providers prescribe antibiotics to treat infections caused by bacteria. Antibiotics are very powerful in treating bacterial infections when they are used properly.  To maintain their effectiveness, they should be used only when necessary.  Overuse of antibiotics has resulted in the development of super bugs that are resistant to treatment!    Home Care:  Only take medications as instructed by your medical  team.  Do not drink alcohol while taking these medications.  A steam or ultrasonic humidifier can help congestion.  You can place a towel over your head and breathe in the steam from hot water coming from a faucet.  Avoid close contacts especially the very young and the elderly.  Cover your mouth when you cough or sneeze.  Always remember to wash your hands.  Get Help Right Away If:  You develop worsening fever or throat pain.  You develop a severe head ache or visual changes.  Your symptoms persist after you have completed your treatment plan.  Make sure you  Understand these instructions.  Will watch your condition.  Will get help right away if you are not doing well or get worse.  Your e-visit answers were reviewed by a board certified advanced clinical practitioner to complete your personal care plan.  Depending on the condition, your plan could have included both over the counter or prescription medications.  If there is a problem please reply  once you have received a response from your provider.  Your safety is important to us.  If you have drug allergies check your prescription carefully.    You can use MyChart to ask questions about todays visit, request a non-urgent call back, or ask for a work or school excuse for 24 hours related to this e-Visit. If it has been greater than 24 hours you will need to follow up with your provider, or enter a new e-Visit to address those concerns.  You will get an e-mail   in the next two days asking about your experience.  I hope that your e-visit has been valuable and will speed your recovery. Thank you for using e-visits.    

## 2018-06-06 NOTE — Progress Notes (Signed)
Virtual Visit via Video Note  I connected with Maria Nunez on 06/06/18 at  8:30 AM EDT by a video enabled telemedicine application and verified that I am speaking with the correct person using two identifiers.   I discussed the limitations of evaluation and management by telemedicine and the availability of in person appointments. The patient expressed understanding and agreed to proceed.  Pt was at home and I was in my office for the virtual visit.     Subjective:    CC: F/U DM   HPI:  Diabetes - + hypoglycemic event, sometime at night. No wounds or sores that are not healing well. No increased thirst or urination. Checking glucose at home. Taking medications as prescribed without any side effects.  Last A1c was 6.9.  She is tried Victoza, Januvia and Onglyza in the past.  We decided to try Tradjenta.  Prescription sent to pharmacy. She has been waling regularly.   Hypertension- Pt denies chest pain, SOB, dizziness, or heart palpitations.  Taking meds as directed w/o problems.  Denies medication side effects.    She also did an ED visit at the end of last week for viral pharyngitis.  She is actually had body aches for almost a week.  She is been taking some Coricidin flu and says that has been helping and she is been feeling a little bit better.  She has been working at home.   Past medical history, Surgical history, Family history not pertinant except as noted below, Social history, Allergies, and medications have been entered into the medical record, reviewed, and corrections made.   Review of Systems: No fevers, chills, night sweats, weight loss, chest pain, or shortness of breath.   Objective:    General: Speaking clearly in complete sentences without any shortness of breath.  Alert and oriented x3.  Normal judgment. No apparent acute distress. She is well groomed.     Impression and Recommendations:   DM -  Having hypoglycemia.  Though, her lowest blood sugar was 119  which technically is not true hypoglycemia but she says when it drops that low she feels bad and feels really nauseated.  Hold the tradjenta for now for one month and see if she feels better she is walking now so I suspect her blood sugars really are lower.  When she returns to work though I am worried that she will not be walking as frequently and we may need to restart the [place].  We might even able to do a combination with a lower dose of the metformin in it.  HTN - well controlled.  F/U in 3 months.   Viral pharyngitis - her throat is better.  Improving.   Hyperlipidemia-doing well on statin.  I discussed the assessment and treatment plan with the patient. The patient was provided an opportunity to ask questions and all were answered. The patient agreed with the plan and demonstrated an understanding of the instructions.   The patient was advised to call back or seek an in-person evaluation if the symptoms worsen or if the condition fails to improve as anticipated.   Maria Lecher, MD

## 2018-06-07 ENCOUNTER — Ambulatory Visit (INDEPENDENT_AMBULATORY_CARE_PROVIDER_SITE_OTHER): Payer: 59 | Admitting: Family Medicine

## 2018-06-07 ENCOUNTER — Encounter: Payer: Self-pay | Admitting: Family Medicine

## 2018-06-07 VITALS — BP 104/71 | Temp 97.9°F | Ht 59.0 in | Wt 167.0 lb

## 2018-06-07 DIAGNOSIS — E785 Hyperlipidemia, unspecified: Secondary | ICD-10-CM | POA: Diagnosis not present

## 2018-06-07 DIAGNOSIS — E1169 Type 2 diabetes mellitus with other specified complication: Secondary | ICD-10-CM | POA: Diagnosis not present

## 2018-06-07 DIAGNOSIS — I10 Essential (primary) hypertension: Secondary | ICD-10-CM | POA: Diagnosis not present

## 2018-06-07 NOTE — Progress Notes (Signed)
[id number]/76 .Maria Nunez [place]

## 2018-06-16 ENCOUNTER — Encounter: Payer: Self-pay | Admitting: Family Medicine

## 2018-07-05 ENCOUNTER — Other Ambulatory Visit: Payer: Self-pay | Admitting: Family Medicine

## 2018-07-05 DIAGNOSIS — Z1231 Encounter for screening mammogram for malignant neoplasm of breast: Secondary | ICD-10-CM

## 2018-07-06 ENCOUNTER — Ambulatory Visit: Payer: 59 | Admitting: Obstetrics and Gynecology

## 2018-07-08 ENCOUNTER — Other Ambulatory Visit: Payer: Self-pay | Admitting: Family Medicine

## 2018-07-08 MED FILL — LOSARTAN-HCTZ 100-25 MG TAB: 100-25 | 90 days supply | Qty: 90 | Fill #0

## 2018-07-08 MED FILL — PRAVASTATIN NA 40 MG TAB: 40 | 90 days supply | Qty: 90 | Fill #0

## 2018-07-08 MED FILL — metFORMIN HCL 1000 MG TABS: 1000 | 30 days supply | Qty: 60 | Fill #0

## 2018-07-11 ENCOUNTER — Ambulatory Visit (INDEPENDENT_AMBULATORY_CARE_PROVIDER_SITE_OTHER): Payer: 59 | Admitting: Obstetrics and Gynecology

## 2018-07-11 ENCOUNTER — Other Ambulatory Visit: Payer: Self-pay | Admitting: Family Medicine

## 2018-07-11 ENCOUNTER — Other Ambulatory Visit: Payer: Self-pay

## 2018-07-11 ENCOUNTER — Encounter: Payer: Self-pay | Admitting: Obstetrics and Gynecology

## 2018-07-11 ENCOUNTER — Other Ambulatory Visit (HOSPITAL_COMMUNITY)
Admission: RE | Admit: 2018-07-11 | Discharge: 2018-07-11 | Disposition: A | Discharge status: Home / Self Care | Payer: 59 | Source: Ambulatory Visit | Attending: Obstetrics and Gynecology | Admitting: Obstetrics and Gynecology

## 2018-07-11 VITALS — BP 120/72 | HR 84 | Temp 97.5°F | Resp 14 | Ht 60.5 in | Wt 169.0 lb

## 2018-07-11 DIAGNOSIS — Z01419 Encounter for gynecological examination (general) (routine) without abnormal findings: Secondary | ICD-10-CM

## 2018-07-11 NOTE — Patient Instructions (Signed)

## 2018-07-11 NOTE — Progress Notes (Signed)
58 y.o. G3P2012 Divorced [name] here for annual exam.    Working from home.   No vaginal bleeding. No pain or discomfort.  No bladder or bowel concerns.   A1C 6.9.  Doing daily walking.   PCP: Beatrice Lecher, MD     Patient's last menstrual period was 01/27/2000 (approximate).           Sexually active: No.  The current method of family planning is status post hysterectomy.    Exercising: Yes.    walking Smoker:  no  Health Maintenance: Pap:  03/09/17 Neg:Neg HR HPV History of abnormal Pap:  Yes,03/24/13 LSIL CIN I;PosHR HPV. [place] 4/9/15showing HPV effect only. No dysplasia. MMG:  07/19/17 BIRADS 1 negative/density c -- scheduled 07/23/18 Colonoscopy:  07-24-13 normal with Dr. Lenise Herald 06/2023 TDaP:  2016 HIV and Hep C: 03/23/14 Negative Screening Labs:  PCP   reports that she has never smoked. She has never used smokeless tobacco. She reports that she does not drink alcohol or use drugs.  Past Medical History:  Diagnosis Date  . Abnormal ultrasound of ovary 2016   Ultrasound showing bilateral ovaries in contact with one another.  Asymptomatic.  [name] Allergy   . Diabetes mellitus    borderline  . Fibroid   . Heart murmur   . Hidradenitis   . Hypertension     Past Surgical History:  Procedure Laterality Date  . ABDOMINAL HYSTERECTOMY     supracervical  . BREAST EXCISIONAL BIOPSY    . BREAST SURGERY     cysts removed right breast  . CARPAL TUNNEL RELEASE Right 01/08/2017   Procedure: CARPAL TUNNEL RELEASE, POSSIBLE RIGHT THUMB CARPOMETACARPAL INJECTION;  Surgeon: Roseanne Kaufman, MD;  Location: [facility];  Service: Orthopedics;  Laterality: Right;  60 MINS  . CESAREAN SECTION    . INGUINAL HIDRADENITIS EXCISION    . LEEP  05/04/13   HPV change    Current Outpatient Medications  Medication Sig Dispense Refill  . ACCU-CHEK FASTCLIX LANCETS MISC For use when checking blood sugars up to four times daily. Dx: E11.9 4 each 11  . ACCU-CHEK  GUIDE test strip USE TO TEST BLOOD SUGAR TWICE DAILY 100 each 12  . Ascorbic Acid (VITAMIN C) 1000 MG tablet Take 1 tablet by mouth daily.    [name] aspirin 81 MG tablet Take 81 mg by mouth daily.      . Blood Glucose Monitoring Suppl (FREESTYLE LITE) DEVI   0  . celecoxib (CELEBREX) 200 MG capsule One to 2 tablets by mouth daily as needed for pain. 60 capsule 2  . Cyanocobalamin (B-12) 500 MCG TABS Take 1 tablet by mouth daily.    . fluticasone (FLONASE) 50 MCG/ACT nasal spray Place 2 sprays into both nostrils daily. 16 g 6  . FOLIC ACID PO Take 782 mcg by mouth daily.     [name] linagliptin (TRADJENTA) 5 MG TABS tablet Take 1 tablet (5 mg total) by mouth daily. 30 tablet 2  . losartan-hydrochlorothiazide (HYZAAR) 100-25 MG tablet TAKE 1 TABLET BY MOUTH DAILY. 90 tablet 0  . metFORMIN (GLUCOPHAGE) 1000 MG tablet TAKE 1 TABLET BY MOUTH 2 TIMES DAILY WITH A MEAL. 180 tablet 0  . Multiple Vitamin (MULTIVITAMIN) tablet Take 1 tablet by mouth daily.      . Omega-3 Fatty Acids (FISH OIL) 1000 MG CAPS Take 1,000 mg by mouth daily.     . pravastatin (PRAVACHOL) 40 MG tablet TAKE 1 TABLET BY MOUTH ONCE DAILY 90 tablet 1  .  pyridOXINE (VITAMIN B-6) 100 MG tablet Take 100 mg by mouth daily.    . Zinc Sulfate (ZINC 15 PO) Take by mouth.       No current facility-administered medications for this visit.     Family History  Problem Relation Age of Onset  . Asthma Mother   . COPD Mother   . Diabetes Mother   . Hypertension Mother   . Diabetes Father   . Hypertension Father   . Breast cancer Maternal Aunt   . Breast cancer Paternal Aunt     Review of Systems  Constitutional: Negative.   HENT: Negative.   Eyes: Negative.   Respiratory: Negative.   Cardiovascular: Negative.   Gastrointestinal: Negative.   Endocrine: Negative.   Genitourinary: Negative.   Musculoskeletal: Negative.   Skin: Negative.   Allergic/Immunologic: Negative.   Neurological: Negative.   Hematological: Negative.    Psychiatric/Behavioral: Negative.     Exam:   BP 120/72 (BP Location: Left Arm, Patient Position: Sitting, Cuff Size: Large)   Pulse 84   Temp (!) 97.5 F (36.4 C) (Temporal)   Resp 14   Ht 5' 0.5" (1.537 m)   Wt 169 lb (76.7 kg)   LMP 01/27/2000 (Approximate)   BMI 32.46 kg/m     General appearance: alert, cooperative and appears stated age Head: normocephalic, without obvious abnormality, atraumatic Neck: no adenopathy, supple, symmetrical, trachea midline and thyroid normal to inspection and palpation Lungs: clear to auscultation bilaterally Breasts: normal appearance, no masses or tenderness, No nipple retraction or dimpling, No nipple discharge or bleeding, No axillary adenopathy Heart: regular rate and rhythm Abdomen: soft, non-tender; no masses, no organomegaly Extremities: extremities normal, atraumatic, no cyanosis or edema Skin: skin color, texture, turgor normal. No rashes or lesions Lymph nodes: cervical, supraclavicular, and axillary nodes normal. Neurologic: grossly normal  Pelvic: External genitalia:  no lesions              No abnormal inguinal nodes palpated.              Urethra:  normal appearing urethra with no masses, tenderness or lesions              Bartholins and Skenes: normal                 Vagina: normal appearing vagina with normal color and discharge, no lesions              Cervix: no lesions.  Cervical stenosis noted.               Pap taken: Yes.   Bimanual Exam:  Uterus:  Absent.              Adnexa: no mass, fullness, tenderness              Rectal exam: Yes.  .  Confirms.              Anus:  normal sphincter tone, no lesions  Chaperone was present for exam.  Assessment:   Well woman visit with normal exam. Hxsupracervical hysterectomy.  Hx LGSIL pap. LEEP showing HPV effect in 2015.   Cervical stenosis.  Status post right breast biopsies.  Abnormal ovaries on ultrasound. Touching one another. DM. Hx vulvar  abscess.  Plan: Mammogram screening discussed. Self breast awareness reviewed. Pap and HR HPV as above. Guidelines for Calcium, Vitamin D, regular exercise program including cardiovascular and weight bearing exercise.   Follow up annually and prn.   After visit  summary provided.

## 2018-07-14 LAB — CYTOLOGY - PAP
Diagnosis: NEGATIVE
HPV: NOT DETECTED

## 2018-07-18 MED FILL — ACCU-CHEK GUIDE TEST STRIP: 50 days supply | Qty: 100 | Fill #0

## 2018-07-23 ENCOUNTER — Ambulatory Visit: Payer: 59

## 2018-07-28 ENCOUNTER — Ambulatory Visit: Payer: 59 | Admitting: Family Medicine

## 2018-08-01 MED FILL — metFORMIN HCL 1000 MG TABS: 1000 | 30 days supply | Qty: 60 | Fill #1

## 2018-08-01 MED FILL — TRADJENTA 5 MG TABLET: 5 | 30 days supply | Qty: 30 | Fill #0

## 2018-08-08 ENCOUNTER — Other Ambulatory Visit: Payer: Self-pay

## 2018-08-08 ENCOUNTER — Encounter: Payer: Self-pay | Admitting: Family Medicine

## 2018-08-08 ENCOUNTER — Ambulatory Visit (INDEPENDENT_AMBULATORY_CARE_PROVIDER_SITE_OTHER): Payer: 59 | Admitting: Family Medicine

## 2018-08-08 VITALS — BP 122/74 | HR 67 | Ht 61.0 in | Wt 168.0 lb

## 2018-08-08 DIAGNOSIS — Z87898 Personal history of other specified conditions: Secondary | ICD-10-CM

## 2018-08-08 DIAGNOSIS — Z8709 Personal history of other diseases of the respiratory system: Secondary | ICD-10-CM | POA: Diagnosis not present

## 2018-08-08 DIAGNOSIS — I10 Essential (primary) hypertension: Secondary | ICD-10-CM

## 2018-08-08 DIAGNOSIS — E1169 Type 2 diabetes mellitus with other specified complication: Secondary | ICD-10-CM

## 2018-08-08 DIAGNOSIS — E785 Hyperlipidemia, unspecified: Secondary | ICD-10-CM

## 2018-08-08 LAB — POCT GLYCOSYLATED HEMOGLOBIN (HGB A1C): Hemoglobin A1C: 6.8 % — AB (ref 4.0–5.6)

## 2018-08-08 NOTE — Assessment & Plan Note (Signed)
Well controlled. Continue current regimen. Follow up in  3-4 months.  

## 2018-08-08 NOTE — Assessment & Plan Note (Signed)
Continue statin therapy.  Tolerating well. Lab Results  Component Value Date   CHOL 158 03/28/2018   HDL 49 (L) 03/28/2018   LDLCALC 84 03/28/2018   LDLDIRECT 125 (H) 01/02/2014   TRIG 146 03/28/2018   CHOLHDL 3.2 03/28/2018

## 2018-08-08 NOTE — Progress Notes (Signed)
Established Patient Office Visit  Subjective:  Patient ID: Maria Nunez, female    DOB: 12-17-1960  Age: 57 y.o. MRN: [id number]  CC:  Chief Complaint  Patient presents with  . Diabetes  . Hypertension    HPI Maria Nunez presents for    Hypertension- Pt denies chest pain, SOB, dizziness, or heart palpitations.  Taking meds as directed w/o problems.  Denies medication side effects.    Diabetes - no hypoglycemic events. No wounds or sores that are not healing well. No increased thirst or urination. Checking glucose at home. Taking medications as prescribed without any side effects.  She did restart the Tradjenta recently because of elevated blood sugars.  Her daughter was having some health concerns and she got quite stressed about it and says her blood sugars were jumping up all over the place.  So far she is been tolerating it well and has not had any hypoglycemic events which is why she had originally stopped it.  She went for her eye exam in May.  She would also like to have antibody testing done.  She got pretty sick in February and just wants to make sure that it was not COVID.   Past Medical History:  Diagnosis Date  . Abnormal ultrasound of ovary 2016   Ultrasound showing bilateral ovaries in contact with one another.  Asymptomatic.  [name] Allergy   . Diabetes mellitus    borderline  . Fibroid   . Heart murmur   . Hidradenitis   . Hypertension     Past Surgical History:  Procedure Laterality Date  . ABDOMINAL HYSTERECTOMY     supracervical  . BREAST EXCISIONAL BIOPSY    . BREAST SURGERY     cysts removed right breast  . CARPAL TUNNEL RELEASE Right 01/08/2017   Procedure: CARPAL TUNNEL RELEASE, POSSIBLE RIGHT THUMB CARPOMETACARPAL INJECTION;  Surgeon: Roseanne Kaufman, MD;  Location: [facility];  Service: Orthopedics;  Laterality: Right;  60 MINS  . CESAREAN SECTION    . INGUINAL HIDRADENITIS EXCISION    . LEEP  05/04/13   HPV change     Family History  Problem Relation Age of Onset  . Asthma Mother   . COPD Mother   . Diabetes Mother   . Hypertension Mother   . Diabetes Father   . Hypertension Father   . Breast cancer Maternal Aunt   . Breast cancer Paternal Aunt     Social History   Socioeconomic History  . Marital status: Divorced    Spouse name: Not on file  . Number of children: Not on file  . Years of education: Not on file  . Highest education level: Not on file  Occupational History  . Not on file  Social Needs  . Financial resource strain: Not on file  . Food insecurity    Worry: Not on file    Inability: Not on file  . Transportation needs    Medical: Not on file    Non-medical: Not on file  Tobacco Use  . Smoking status: Never Smoker  . Smokeless tobacco: Never Used  Substance and Sexual Activity  . Alcohol use: No    Alcohol/week: 0.0 standard drinks  . Drug use: No  . Sexual activity: Not Currently    Partners: Male    Birth control/protection: Surgical    Comment: TAH/supracervical  Lifestyle  . Physical activity    Days per week: Not on file    Minutes per session:  Not on file  . Stress: Not on file  Relationships  . Social Herbalist on phone: Not on file    Gets together: Not on file    Attends religious service: Not on file    Active member of club or organization: Not on file    Attends meetings of clubs or organizations: Not on file    Relationship status: Not on file  . Intimate partner violence    Fear of current or ex partner: Not on file    Emotionally abused: Not on file    Physically abused: Not on file    Forced sexual activity: Not on file  Other Topics Concern  . Not on file  Social History Narrative  . Not on file    Outpatient Medications Prior to Visit  Medication Sig Dispense Refill  . ACCU-CHEK FASTCLIX LANCETS MISC For use when checking blood sugars up to four times daily. Dx: E11.9 4 each 11  . ACCU-CHEK GUIDE test strip USE TO  TEST BLOOD SUGAR TWICE DAILY 100 each 9  . Ascorbic Acid (VITAMIN C) 1000 MG tablet Take 1 tablet by mouth daily.    [name] aspirin 81 MG tablet Take 81 mg by mouth daily.      . Blood Glucose Monitoring Suppl (FREESTYLE LITE) DEVI   0  . celecoxib (CELEBREX) 200 MG capsule One to 2 tablets by mouth daily as needed for pain. 60 capsule 2  . Cyanocobalamin (B-12) 500 MCG TABS Take 1 tablet by mouth daily.    . fluticasone (FLONASE) 50 MCG/ACT nasal spray Place 2 sprays into both nostrils daily. 16 g 6  . FOLIC ACID PO Take 952 mcg by mouth daily.     [name] linagliptin (TRADJENTA) 5 MG TABS tablet Take 1 tablet (5 mg total) by mouth daily. 30 tablet 2  . losartan-hydrochlorothiazide (HYZAAR) 100-25 MG tablet TAKE 1 TABLET BY MOUTH DAILY. 90 tablet 0  . metFORMIN (GLUCOPHAGE) 1000 MG tablet TAKE 1 TABLET BY MOUTH 2 TIMES DAILY WITH A MEAL. 180 tablet 0  . Multiple Vitamin (MULTIVITAMIN) tablet Take 1 tablet by mouth daily.      . Omega-3 Fatty Acids (FISH OIL) 1000 MG CAPS Take 1,000 mg by mouth daily.     . pravastatin (PRAVACHOL) 40 MG tablet TAKE 1 TABLET BY MOUTH ONCE DAILY 90 tablet 1  . pyridOXINE (VITAMIN B-6) 100 MG tablet Take 100 mg by mouth daily.    . Zinc Sulfate (ZINC 15 PO) Take by mouth.       No facility-administered medications prior to visit.     Allergies  Allergen Reactions  . Sulfonamide Derivatives Hives    REACTION: hives  . Xigduo Xr [Dapagliflozin-Metformin Hcl Er] Hives  . Hycodan [Hydrocodone-Homatropine] Itching  . Cefaclor   . Cephalexin   . Januvia [Sitagliptin] Nausea Only    Back pain  . Levofloxacin Itching and Rash  . Penicillins   . Tetracycline   . Victoza [Liraglutide] Other (See Comments)    Back pain    ROS Review of Systems    Objective:    Physical Exam  Constitutional: She is oriented to person, place, and time. She appears well-developed and well-nourished.  HENT:  Head: Normocephalic and atraumatic.  Cardiovascular: Normal rate, regular  rhythm and normal heart sounds.  Pulmonary/Chest: Effort normal and breath sounds normal.  Neurological: She is alert and oriented to person, place, and time.  Skin: Skin is warm and dry.  Psychiatric: She  has a normal mood and affect. Her behavior is normal.    BP 122/74   Pulse 67   Ht 5\' 1"  (1.549 m)   Wt 168 lb (76.2 kg)   LMP 01/27/2000 (Approximate)   SpO2 98%   BMI 31.74 kg/m  Wt Readings from Last 3 Encounters:  08/08/18 168 lb (76.2 kg)  07/11/18 169 lb (76.7 kg)  06/07/18 167 lb (75.8 kg)     Health Maintenance Due  Topic Date Due  . FOOT EXAM  06/30/2018    There are no preventive care reminders to display for this patient.  Lab Results  Component Value Date   TSH 0.96 11/25/2017   Lab Results  Component Value Date   WBC 8.4 11/25/2017   HGB 13.3 11/25/2017   HCT 39.7 11/25/2017   MCV 87.6 11/25/2017   PLT 288 11/25/2017   Lab Results  Component Value Date   NA 140 03/28/2018   K 4.0 03/28/2018   CO2 26 03/28/2018   GLUCOSE 131 (H) 03/28/2018   BUN 12 03/28/2018   CREATININE 0.63 03/28/2018   BILITOT 0.4 03/28/2018   ALKPHOS 76 10/29/2015   AST 27 03/28/2018   ALT 41 (H) 03/28/2018   PROT 6.8 03/28/2018   ALBUMIN 4.4 10/29/2015   CALCIUM 10.7 (H) 03/28/2018   Lab Results  Component Value Date   CHOL 158 03/28/2018   Lab Results  Component Value Date   HDL 49 (L) 03/28/2018   Lab Results  Component Value Date   LDLCALC 84 03/28/2018   Lab Results  Component Value Date   TRIG 146 03/28/2018   Lab Results  Component Value Date   CHOLHDL 3.2 03/28/2018   Lab Results  Component Value Date   HGBA1C 6.8 (A) 08/08/2018      Assessment & Plan:   Problem List Items Addressed This Visit      Cardiovascular and Mediastinum   HYPERTENSION, BENIGN SYSTEMIC - Primary    Well controlled. Continue current regimen. Follow up in  3-4 months.         Endocrine   Type 2 diabetes mellitus with other specified complication (HCC)     Well controlled. Continue current regimen. Follow up in  3-4 months.       Relevant Orders   POCT glycosylated hemoglobin (Hb A1C) (Completed)   Hyperlipidemia associated with type 2 diabetes mellitus ([place])    Continue statin therapy.  Tolerating well. Lab Results  Component Value Date   CHOL 158 03/28/2018   HDL 49 (L) 03/28/2018   LDLCALC 84 03/28/2018   LDLDIRECT 125 (H) 01/02/2014   TRIG 146 03/28/2018   CHOLHDL 3.2 03/28/2018          Other Visit Diagnoses    History of persistent cough       Relevant Orders   SAR CoV2 Serology (COVID 19)AB(IGG)IA     We will work on getting antibiotic COVID testing.  She has her mammogram scheduled for August.  She would like to have antibody test done for COVID.  Lab order placed.   No orders of the defined types were placed in this encounter.   Follow-up: Return in about 3 months (around 11/08/2018) for Diabetes follow-up.    Beatrice Lecher, MD

## 2018-08-09 LAB — SAR COV2 SEROLOGY (COVID19)AB(IGG),IA: SARS CoV2 AB IGG: NEGATIVE

## 2018-08-27 HISTORY — PX: BREAST SURGERY: SHX581

## 2018-09-06 ENCOUNTER — Other Ambulatory Visit: Payer: Self-pay | Admitting: Family Medicine

## 2018-09-06 MED FILL — ACCU-CHEK GUIDE TEST STRIP: 50 days supply | Qty: 100 | Fill #0

## 2018-09-06 MED FILL — metFORMIN HCL 1000 MG TABS: 1000 | 30 days supply | Qty: 60 | Fill #2

## 2018-09-06 MED FILL — TRADJENTA 5 MG TABLET: 5 | 30 days supply | Qty: 30 | Fill #0

## 2018-09-07 ENCOUNTER — Encounter: Payer: Self-pay | Admitting: Family Medicine

## 2018-09-07 ENCOUNTER — Ambulatory Visit (INDEPENDENT_AMBULATORY_CARE_PROVIDER_SITE_OTHER): Payer: 59 | Admitting: Family Medicine

## 2018-09-07 VITALS — BP 150/57 | HR 80 | Wt 170.0 lb

## 2018-09-07 DIAGNOSIS — M79604 Pain in right leg: Secondary | ICD-10-CM

## 2018-09-07 DIAGNOSIS — M545 Low back pain, unspecified: Secondary | ICD-10-CM

## 2018-09-07 DIAGNOSIS — M5416 Radiculopathy, lumbar region: Secondary | ICD-10-CM | POA: Diagnosis not present

## 2018-09-07 MED ORDER — KETOROLAC TROMETHAMINE 60 MG/2ML IM SOLN
60.0000 mg | Freq: Once | INTRAMUSCULAR | Status: AC
  Administered 2018-09-07: 08:00:00 60 mg via INTRAMUSCULAR

## 2018-09-07 MED ORDER — CYCLOBENZAPRINE HCL 10 MG PO TABS: 5.0000 mg | ORAL_TABLET | Freq: Every evening | ORAL | 0 refills | Status: DC | PRN

## 2018-09-07 MED ORDER — PREDNISONE 20 MG PO TABS: 40.0000 mg | ORAL_TABLET | Freq: Every day | ORAL | 0 refills | Status: DC

## 2018-09-07 NOTE — Progress Notes (Signed)
Acute Office Visit  Subjective:    Patient ID: Maria Nunez, female    DOB: [birth year], 58 y.o.   MRN: [id number]  Chief Complaint  Patient presents with  . Back Pain    Lower right side back pain starting last night. She has been doing some moving. The pain is a sharp pain and is a 8/10.    HPI Patient is in today for Back Pain.  She has had some low back problems on and off before but yesterday evening after doing a lot of lifting and helping her daughter move she started having more excruciating right-sided low back pain.  She says that she does not remember a specific injury.  She has had pain going down her right leg to her right outer foot.  She has had sciatica before.  She describes the pain is sharp and intense.  She rated it an 8 out of 10.  She has tried heat, ice, ibuprofen and Tylenol.  Ibuprofen did provide some mild relief the Tylenol did not help at all.  No weakness in the extremity.    Past Medical History:  Diagnosis Date  . Abnormal ultrasound of ovary 2016   Ultrasound showing bilateral ovaries in contact with one another.  Asymptomatic.  [name] Allergy   . Diabetes mellitus    borderline  . Fibroid   . Heart murmur   . Hidradenitis   . Hypertension     Past Surgical History:  Procedure Laterality Date  . ABDOMINAL HYSTERECTOMY     supracervical  . BREAST EXCISIONAL BIOPSY    . BREAST SURGERY     cysts removed right breast  . CARPAL TUNNEL RELEASE Right 01/08/2017   Procedure: CARPAL TUNNEL RELEASE, POSSIBLE RIGHT THUMB CARPOMETACARPAL INJECTION;  Surgeon: Roseanne Kaufman, MD;  Location: [facility];  Service: Orthopedics;  Laterality: Right;  60 MINS  . CESAREAN SECTION    . INGUINAL HIDRADENITIS EXCISION    . LEEP  05/04/13   HPV change    Family History  Problem Relation Age of Onset  . Asthma Mother   . COPD Mother   . Diabetes Mother   . Hypertension Mother   . Diabetes Father   . Hypertension Father   . Breast cancer  Maternal Aunt   . Breast cancer Paternal Aunt     Social History   Socioeconomic History  . Marital status: Divorced    Spouse name: Not on file  . Number of children: Not on file  . Years of education: Not on file  . Highest education level: Not on file  Occupational History  . Not on file  Social Needs  . Financial resource strain: Not on file  . Food insecurity    Worry: Not on file    Inability: Not on file  . Transportation needs    Medical: Not on file    Non-medical: Not on file  Tobacco Use  . Smoking status: Never Smoker  . Smokeless tobacco: Never Used  Substance and Sexual Activity  . Alcohol use: No    Alcohol/week: 0.0 standard drinks  . Drug use: No  . Sexual activity: Not Currently    Partners: Male    Birth control/protection: Surgical    Comment: TAH/supracervical  Lifestyle  . Physical activity    Days per week: Not on file    Minutes per session: Not on file  . Stress: Not on file  Relationships  . Social connections  Talks on phone: Not on file    Gets together: Not on file    Attends religious service: Not on file    Active member of club or organization: Not on file    Attends meetings of clubs or organizations: Not on file    Relationship status: Not on file  . Intimate partner violence    Fear of current or ex partner: Not on file    Emotionally abused: Not on file    Physically abused: Not on file    Forced sexual activity: Not on file  Other Topics Concern  . Not on file  Social History Narrative  . Not on file    Outpatient Medications Prior to Visit  Medication Sig Dispense Refill  . ACCU-CHEK FASTCLIX LANCETS MISC For use when checking blood sugars up to four times daily. Dx: E11.9 4 each 11  . ACCU-CHEK GUIDE test strip USE TO TEST BLOOD SUGAR TWICE DAILY 100 each 9  . Ascorbic Acid (VITAMIN C) 1000 MG tablet Take 1 tablet by mouth daily.    [name] aspirin 81 MG tablet Take 81 mg by mouth daily.      . Blood Glucose Monitoring  Suppl (FREESTYLE LITE) DEVI   0  . celecoxib (CELEBREX) 200 MG capsule One to 2 tablets by mouth daily as needed for pain. 60 capsule 2  . Cyanocobalamin (B-12) 500 MCG TABS Take 1 tablet by mouth daily.    . fluticasone (FLONASE) 50 MCG/ACT nasal spray Place 2 sprays into both nostrils daily. 16 g 6  . FOLIC ACID PO Take 297 mcg by mouth daily.     [name] losartan-hydrochlorothiazide (HYZAAR) 100-25 MG tablet TAKE 1 TABLET BY MOUTH DAILY. 90 tablet 0  . metFORMIN (GLUCOPHAGE) 1000 MG tablet TAKE 1 TABLET BY MOUTH 2 TIMES DAILY WITH A MEAL. 180 tablet 0  . Multiple Vitamin (MULTIVITAMIN) tablet Take 1 tablet by mouth daily.      . Omega-3 Fatty Acids (FISH OIL) 1000 MG CAPS Take 1,000 mg by mouth daily.     . pravastatin (PRAVACHOL) 40 MG tablet TAKE 1 TABLET BY MOUTH ONCE DAILY 90 tablet 1  . pyridOXINE (VITAMIN B-6) 100 MG tablet Take 100 mg by mouth daily.    . TRADJENTA 5 MG TABS tablet TAKE 1 TABLET (5 MG TOTAL) BY MOUTH DAILY. 90 tablet 1  . Zinc Sulfate (ZINC 15 PO) Take by mouth.       No facility-administered medications prior to visit.     Allergies  Allergen Reactions  . Sulfonamide Derivatives Hives    REACTION: hives  . Xigduo Xr [Dapagliflozin-Metformin Hcl Er] Hives  . Hycodan [Hydrocodone-Homatropine] Itching  . Cefaclor   . Cephalexin   . Januvia [Sitagliptin] Nausea Only    Back pain  . Levofloxacin Itching and Rash  . Penicillins   . Tetracycline   . Victoza [Liraglutide] Other (See Comments)    Back pain    ROS     Objective:    Physical Exam  Constitutional: She is oriented to person, place, and time. She appears well-developed and well-nourished.  HENT:  Head: Normocephalic and atraumatic.  Eyes: Conjunctivae and EOM are normal.  Cardiovascular: Normal rate.  Pulmonary/Chest: Effort normal.  Musculoskeletal:     Comments: Decreased lumbar flexion but she is able to flex about 45 degrees.  Pain with extension as well.  No significant discomfort with  rotation.  Some pain with side bending to the right.  Negative straight leg raise bilaterally.  Hip, knee, ankle strength is 5-5 bilaterally.  Patellar reflexes 0+ bilaterally  Neurological: She is alert and oriented to person, place, and time.  Skin: Skin is dry. No pallor.  Psychiatric: She has a normal mood and affect. Her behavior is normal.  Vitals reviewed.   BP (!) 150/57   Pulse 80   Wt 170 lb (77.1 kg)   LMP 01/27/2000 (Approximate)   SpO2 99%   BMI 32.12 kg/m  Wt Readings from Last 3 Encounters:  09/07/18 170 lb (77.1 kg)  08/08/18 168 lb (76.2 kg)  07/11/18 169 lb (76.7 kg)    Health Maintenance Due  Topic Date Due  . FOOT EXAM  06/30/2018    There are no preventive care reminders to display for this patient.   Lab Results  Component Value Date   TSH 0.96 11/25/2017   Lab Results  Component Value Date   WBC 8.4 11/25/2017   HGB 13.3 11/25/2017   HCT 39.7 11/25/2017   MCV 87.6 11/25/2017   PLT 288 11/25/2017   Lab Results  Component Value Date   NA 140 03/28/2018   K 4.0 03/28/2018   CO2 26 03/28/2018   GLUCOSE 131 (H) 03/28/2018   BUN 12 03/28/2018   CREATININE 0.63 03/28/2018   BILITOT 0.4 03/28/2018   ALKPHOS 76 10/29/2015   AST 27 03/28/2018   ALT 41 (H) 03/28/2018   PROT 6.8 03/28/2018   ALBUMIN 4.4 10/29/2015   CALCIUM 10.7 (H) 03/28/2018   Lab Results  Component Value Date   CHOL 158 03/28/2018   Lab Results  Component Value Date   HDL 49 (L) 03/28/2018   Lab Results  Component Value Date   LDLCALC 84 03/28/2018   Lab Results  Component Value Date   TRIG 146 03/28/2018   Lab Results  Component Value Date   CHOLHDL 3.2 03/28/2018   Lab Results  Component Value Date   HGBA1C 6.8 (A) 08/08/2018       Assessment & Plan:   Problem List Items Addressed This Visit    None    Visit Diagnoses    Lumbar radiculopathy    -  Primary   Relevant Medications   cyclobenzaprine (FLEXERIL) 10 MG tablet   ketorolac (TORADOL)  injection 60 mg (Completed)   Other Relevant Orders   Ambulatory referral to Physical Therapy   Low back pain radiating to right leg       Relevant Medications   predniSONE (DELTASONE) 20 MG tablet   cyclobenzaprine (FLEXERIL) 10 MG tablet   ketorolac (TORADOL) injection 60 mg (Completed)   Other Relevant Orders   Ambulatory referral to Physical Therapy     Acute right low back with radiculopathy - will tx with oral prdnisone and muscle relaxer.  Did warn about potential sedation.  Make sure to take prednisone with food and water.  Stop immediately if GI upset or irritation.  Hold ibuprofen while taking the prednisone then okay to restart at that point.  Given handout for home stretches.  We will also make formal referral for physical therapy.  Meds ordered this encounter  Medications  . predniSONE (DELTASONE) 20 MG tablet    Sig: Take 2 tablets (40 mg total) by mouth daily with breakfast.    Dispense:  10 tablet    Refill:  0  . cyclobenzaprine (FLEXERIL) 10 MG tablet    Sig: Take 0.5-1 tablets (5-10 mg total) by mouth at bedtime as needed for muscle spasms.    Dispense:  30 tablet    Refill:  0  . ketorolac (TORADOL) injection 60 mg     Beatrice Lecher, MD

## 2018-09-08 ENCOUNTER — Ambulatory Visit
Admission: RE | Admit: 2018-09-08 | Discharge: 2018-09-08 | Disposition: A | Discharge status: Home / Self Care | Payer: 59 | Source: Ambulatory Visit | Attending: Family Medicine | Admitting: Family Medicine

## 2018-09-08 ENCOUNTER — Other Ambulatory Visit: Payer: Self-pay

## 2018-09-08 DIAGNOSIS — Z1231 Encounter for screening mammogram for malignant neoplasm of breast: Secondary | ICD-10-CM | POA: Diagnosis not present

## 2018-09-09 ENCOUNTER — Other Ambulatory Visit: Payer: Self-pay | Admitting: Family Medicine

## 2018-09-09 DIAGNOSIS — N6489 Other specified disorders of breast: Secondary | ICD-10-CM

## 2018-09-09 DIAGNOSIS — R928 Other abnormal and inconclusive findings on diagnostic imaging of breast: Secondary | ICD-10-CM

## 2018-09-13 ENCOUNTER — Other Ambulatory Visit: Payer: 59

## 2018-09-14 ENCOUNTER — Other Ambulatory Visit: Payer: Self-pay | Admitting: Family Medicine

## 2018-09-14 ENCOUNTER — Ambulatory Visit
Admission: RE | Admit: 2018-09-14 | Discharge: 2018-09-14 | Disposition: A | Discharge status: Home / Self Care | Payer: 59 | Source: Ambulatory Visit | Attending: Family Medicine | Admitting: Family Medicine

## 2018-09-14 DIAGNOSIS — R928 Other abnormal and inconclusive findings on diagnostic imaging of breast: Secondary | ICD-10-CM | POA: Diagnosis not present

## 2018-09-14 DIAGNOSIS — N6310 Unspecified lump in the right breast, unspecified quadrant: Secondary | ICD-10-CM

## 2018-09-14 DIAGNOSIS — N6311 Unspecified lump in the right breast, upper outer quadrant: Secondary | ICD-10-CM | POA: Diagnosis not present

## 2018-09-16 ENCOUNTER — Other Ambulatory Visit: Payer: Self-pay

## 2018-09-16 ENCOUNTER — Ambulatory Visit
Admission: RE | Admit: 2018-09-16 | Discharge: 2018-09-16 | Disposition: A | Discharge status: Home / Self Care | Payer: 59 | Source: Ambulatory Visit | Attending: Family Medicine | Admitting: Family Medicine

## 2018-09-16 ENCOUNTER — Other Ambulatory Visit: Payer: Self-pay | Admitting: Family Medicine

## 2018-09-16 DIAGNOSIS — N6310 Unspecified lump in the right breast, unspecified quadrant: Secondary | ICD-10-CM

## 2018-09-16 DIAGNOSIS — N6315 Unspecified lump in the right breast, overlapping quadrants: Secondary | ICD-10-CM | POA: Diagnosis not present

## 2018-09-16 DIAGNOSIS — N6311 Unspecified lump in the right breast, upper outer quadrant: Secondary | ICD-10-CM | POA: Diagnosis not present

## 2018-09-17 DIAGNOSIS — M542 Cervicalgia: Secondary | ICD-10-CM | POA: Diagnosis not present

## 2018-09-17 DIAGNOSIS — M546 Pain in thoracic spine: Secondary | ICD-10-CM | POA: Diagnosis not present

## 2018-09-17 DIAGNOSIS — M62830 Muscle spasm of back: Secondary | ICD-10-CM | POA: Diagnosis not present

## 2018-09-17 DIAGNOSIS — M545 Low back pain: Secondary | ICD-10-CM | POA: Diagnosis not present

## 2018-09-28 ENCOUNTER — Encounter: Payer: Self-pay | Admitting: Family Medicine

## 2018-09-28 DIAGNOSIS — M5416 Radiculopathy, lumbar region: Secondary | ICD-10-CM

## 2018-09-28 DIAGNOSIS — M545 Low back pain, unspecified: Secondary | ICD-10-CM

## 2018-09-28 DIAGNOSIS — M79604 Pain in right leg: Secondary | ICD-10-CM

## 2018-09-28 NOTE — Telephone Encounter (Signed)
Imaging ordered.  She can go at her convenience.

## 2018-09-28 NOTE — Telephone Encounter (Signed)
Patient advised.

## 2018-09-29 ENCOUNTER — Ambulatory Visit (INDEPENDENT_AMBULATORY_CARE_PROVIDER_SITE_OTHER): Payer: 59

## 2018-09-29 ENCOUNTER — Other Ambulatory Visit: Payer: Self-pay

## 2018-09-29 DIAGNOSIS — M545 Low back pain, unspecified: Secondary | ICD-10-CM

## 2018-09-29 DIAGNOSIS — M5416 Radiculopathy, lumbar region: Secondary | ICD-10-CM

## 2018-09-29 DIAGNOSIS — M79604 Pain in right leg: Secondary | ICD-10-CM

## 2018-09-30 ENCOUNTER — Ambulatory Visit (INDEPENDENT_AMBULATORY_CARE_PROVIDER_SITE_OTHER): Payer: 59 | Admitting: Physician Assistant

## 2018-09-30 ENCOUNTER — Encounter: Payer: Self-pay | Admitting: Physician Assistant

## 2018-09-30 VITALS — BP 133/83 | HR 87 | Temp 98.4°F | Wt 170.0 lb

## 2018-09-30 DIAGNOSIS — L089 Local infection of the skin and subcutaneous tissue, unspecified: Secondary | ICD-10-CM | POA: Diagnosis not present

## 2018-09-30 DIAGNOSIS — Z9889 Other specified postprocedural states: Secondary | ICD-10-CM | POA: Diagnosis not present

## 2018-09-30 DIAGNOSIS — M62830 Muscle spasm of back: Secondary | ICD-10-CM | POA: Diagnosis not present

## 2018-09-30 DIAGNOSIS — M545 Low back pain: Secondary | ICD-10-CM | POA: Diagnosis not present

## 2018-09-30 DIAGNOSIS — M542 Cervicalgia: Secondary | ICD-10-CM | POA: Diagnosis not present

## 2018-09-30 DIAGNOSIS — M546 Pain in thoracic spine: Secondary | ICD-10-CM | POA: Diagnosis not present

## 2018-09-30 MED ORDER — CLINDAMYCIN HCL 150 MG PO CAPS: 150.0000 mg | ORAL_CAPSULE | Freq: Three times a day (TID) | ORAL | 0 refills | Status: DC

## 2018-09-30 NOTE — Progress Notes (Signed)
HPI:                                                                Maria Nunez is a 58 y.o. female who presents to [facility] today for right breast rash  Onset 5 days ago (Sunday) Gradually worsening Red, slightly itchy/burning pain and slight warmth at the site of her breast biopsy, which was performed on 09/16/18. Thankfully bx was benign Denies fever, chills, malaise, streaking redness, adenopathy Denies hx of MRSA  Past Medical History:  Diagnosis Date  . Abnormal ultrasound of ovary 2016   Ultrasound showing bilateral ovaries in contact with one another.  Asymptomatic.  [name] Allergy   . Diabetes mellitus    borderline  . Fibroid   . Heart murmur   . Hidradenitis   . Hypertension    Past Surgical History:  Procedure Laterality Date  . ABDOMINAL HYSTERECTOMY     supracervical  . BREAST EXCISIONAL BIOPSY Right   . BREAST SURGERY     cysts removed right breast  . CARPAL TUNNEL RELEASE Right 01/08/2017   Procedure: CARPAL TUNNEL RELEASE, POSSIBLE RIGHT THUMB CARPOMETACARPAL INJECTION;  Surgeon: Roseanne Kaufman, MD;  Location: [facility];  Service: Orthopedics;  Laterality: Right;  60 MINS  . CESAREAN SECTION    . INGUINAL HIDRADENITIS EXCISION    . LEEP  05/04/13   HPV change   Social History   Tobacco Use  . Smoking status: Never Smoker  . Smokeless tobacco: Never Used  Substance Use Topics  . Alcohol use: No    Alcohol/week: 0.0 standard drinks   family history includes Asthma in her mother; Breast cancer in her maternal aunt and paternal aunt; COPD in her mother; Diabetes in her father and mother; Hypertension in her father and mother.    ROS: negative except as noted in the HPI  Medications: Current Outpatient Medications  Medication Sig Dispense Refill  . ACCU-CHEK FASTCLIX LANCETS MISC For use when checking blood sugars up to four times daily. Dx: E11.9 4 each 11  . ACCU-CHEK GUIDE  test strip USE TO TEST BLOOD SUGAR TWICE DAILY 100 each 9  . Ascorbic Acid (VITAMIN C) 1000 MG tablet Take 1 tablet by mouth daily.    [name] aspirin 81 MG tablet Take 81 mg by mouth daily.      . Blood Glucose Monitoring Suppl (FREESTYLE LITE) DEVI   0  . celecoxib (CELEBREX) 200 MG capsule One to 2 tablets by mouth daily as needed for pain. 60 capsule 2  . clindamycin (CLEOCIN) 150 MG capsule Take 1 capsule (150 mg total) by mouth 3 (three) times daily for 5 days. 15 capsule 0  . Cyanocobalamin (B-12) 500 MCG TABS Take 1 tablet by mouth daily.    . cyclobenzaprine (FLEXERIL) 10 MG tablet Take 0.5-1 tablets (5-10 mg total) by mouth at bedtime as needed for muscle spasms. 30 tablet 0  . fluticasone (FLONASE) 50 MCG/ACT nasal spray Place 2 sprays into both nostrils daily. 16 g 6  . FOLIC ACID PO Take Q000111Q mcg by mouth daily.     [name] losartan-hydrochlorothiazide (HYZAAR) 100-25 MG tablet TAKE 1 TABLET BY MOUTH DAILY. 90 tablet 0  . metFORMIN (GLUCOPHAGE) 1000 MG tablet TAKE  1 TABLET BY MOUTH 2 TIMES DAILY WITH A MEAL. 180 tablet 0  . Multiple Vitamin (MULTIVITAMIN) tablet Take 1 tablet by mouth daily.      . Omega-3 Fatty Acids (FISH OIL) 1000 MG CAPS Take 1,000 mg by mouth daily.     . pravastatin (PRAVACHOL) 40 MG tablet TAKE 1 TABLET BY MOUTH ONCE DAILY 90 tablet 1  . pyridOXINE (VITAMIN B-6) 100 MG tablet Take 100 mg by mouth daily.    . TRADJENTA 5 MG TABS tablet TAKE 1 TABLET (5 MG TOTAL) BY MOUTH DAILY. 90 tablet 1  . Zinc Sulfate (ZINC 15 PO) Take by mouth.       No current facility-administered medications for this visit.    Allergies  Allergen Reactions  . Sulfonamide Derivatives Hives    REACTION: hives  . Xigduo Xr [Dapagliflozin-Metformin Hcl Er] Hives  . Hycodan [Hydrocodone-Homatropine] Itching  . Cefaclor Rash  . Cephalexin Rash  . Januvia [Sitagliptin] Nausea Only    Back pain  . Levofloxacin Itching and Rash  . Penicillins Other (See Comments)    unknown  . Tetracycline  Nausea Only    GI upset  . Victoza [Liraglutide] Other (See Comments)    Back pain       Objective:  BP 133/83   Pulse 87   Temp 98.4 F (36.9 C)   Wt 170 lb (77.1 kg)   LMP 01/27/2000 (Approximate)   BMI 32.12 kg/m  Gen:  alert, not ill-appearing, no distress, appropriate for age 44: Normal work of breathing, normal phonation  Neuro: alert and oriented x 3, no tremor MSK: extremities atraumatic, normal gait and station Skin: right lateral breast adjacent to the areola there is a circular, well-circumscribed 3 cm area of redness without induration; incision/biopsy site is well-healed without drainage; no lymphatic streaking   Assessment and Plan: 58 y.o. female with   .[name] was seen today for wound check.  Diagnoses and all orders for this visit:  S/P breast biopsy, right  Skin infection -     clindamycin (CLEOCIN) 150 MG capsule; Take 1 capsule (150 mg total) by mouth 3 (three) times daily for 5 days.   Non-purulent infection appears mild and well-localized I think she would respond well to a low-dose antibiotic Reviewed multiple antibiotic allergies including PCN, Cephalosporins, Sulfa and Tetracyclines Will use Clindamycin 150 mg tid x 5 days    Patient education and anticipatory guidance given Patient agrees with treatment plan Follow-up in 4 days or sooner as needed if symptoms worsen or fail to improve  Darlyne Russian PA-C

## 2018-09-30 NOTE — Patient Instructions (Signed)

## 2018-10-04 ENCOUNTER — Ambulatory Visit (INDEPENDENT_AMBULATORY_CARE_PROVIDER_SITE_OTHER): Payer: 59 | Admitting: Family Medicine

## 2018-10-04 ENCOUNTER — Ambulatory Visit: Payer: 59 | Admitting: Physician Assistant

## 2018-10-04 ENCOUNTER — Other Ambulatory Visit: Payer: Self-pay

## 2018-10-04 ENCOUNTER — Encounter: Payer: Self-pay | Admitting: Family Medicine

## 2018-10-04 VITALS — BP 137/66 | HR 86 | Ht 61.0 in | Wt 167.0 lb

## 2018-10-04 DIAGNOSIS — R21 Rash and other nonspecific skin eruption: Secondary | ICD-10-CM | POA: Diagnosis not present

## 2018-10-04 MED ORDER — DOXYCYCLINE HYCLATE 100 MG PO TABS: 100.0000 mg | ORAL_TABLET | Freq: Two times a day (BID) | ORAL | 0 refills | Status: DC

## 2018-10-04 MED ORDER — CLOTRIMAZOLE-BETAMETHASONE 1-0.05 % EX CREA: 1.0000 "application " | TOPICAL_CREAM | Freq: Two times a day (BID) | CUTANEOUS | 0 refills | Status: DC

## 2018-10-04 NOTE — Progress Notes (Signed)
Acute Office Visit  Subjective:    Patient ID: Maria Nunez, female    DOB: 10-29-1960, 58 y.o.   MRN: [id number]  Chief Complaint  Patient presents with  . Rash    R breast she is on the last day of the ABX, she stated that the area gets clear then the rash will return and begin to itch.    HPI Patient is in today for follow-up breast cellulitis status post breast biopsy. She says the rash is not better but is not worse. No fever, chills or sweats.  She has one more day of clindamycin left. She says it started about a week after her biopsy in the exact location. She says there were a few red "bumps". She never notices any pustules, vesicles, or drainage. The rash hasn't worsened pr spread  Past Medical History:  Diagnosis Date  . Abnormal ultrasound of ovary 2016   Ultrasound showing bilateral ovaries in contact with one another.  Asymptomatic.  [name] Allergy   . Diabetes mellitus    borderline  . Fibroid   . Heart murmur   . Hidradenitis   . Hypertension     Past Surgical History:  Procedure Laterality Date  . ABDOMINAL HYSTERECTOMY     supracervical  . BREAST EXCISIONAL BIOPSY Right   . BREAST SURGERY     cysts removed right breast  . CARPAL TUNNEL RELEASE Right 01/08/2017   Procedure: CARPAL TUNNEL RELEASE, POSSIBLE RIGHT THUMB CARPOMETACARPAL INJECTION;  Surgeon: Roseanne Kaufman, MD;  Location: [facility];  Service: Orthopedics;  Laterality: Right;  60 MINS  . CESAREAN SECTION    . INGUINAL HIDRADENITIS EXCISION    . LEEP  05/04/13   HPV change    Family History  Problem Relation Age of Onset  . Asthma Mother   . COPD Mother   . Diabetes Mother   . Hypertension Mother   . Diabetes Father   . Hypertension Father   . Breast cancer Maternal Aunt   . Breast cancer Paternal Aunt     Social History   Socioeconomic History  . Marital status: Divorced    Spouse name: Not on file  . Number of children: Not on file  . Years of education: Not  on file  . Highest education level: Not on file  Occupational History  . Not on file  Social Needs  . Financial resource strain: Not on file  . Food insecurity    Worry: Not on file    Inability: Not on file  . Transportation needs    Medical: Not on file    Non-medical: Not on file  Tobacco Use  . Smoking status: Never Smoker  . Smokeless tobacco: Never Used  Substance and Sexual Activity  . Alcohol use: No    Alcohol/week: 0.0 standard drinks  . Drug use: No  . Sexual activity: Not Currently    Partners: Male    Birth control/protection: Surgical    Comment: TAH/supracervical  Lifestyle  . Physical activity    Days per week: Not on file    Minutes per session: Not on file  . Stress: Not on file  Relationships  . Social Herbalist on phone: Not on file    Gets together: Not on file    Attends religious service: Not on file    Active member of club or organization: Not on file    Attends meetings of clubs or organizations: Not on file  Relationship status: Not on file  . Intimate partner violence    Fear of current or ex partner: Not on file    Emotionally abused: Not on file    Physically abused: Not on file    Forced sexual activity: Not on file  Other Topics Concern  . Not on file  Social History Narrative  . Not on file    Outpatient Medications Prior to Visit  Medication Sig Dispense Refill  . ACCU-CHEK FASTCLIX LANCETS MISC For use when checking blood sugars up to four times daily. Dx: E11.9 4 each 11  . ACCU-CHEK GUIDE test strip USE TO TEST BLOOD SUGAR TWICE DAILY 100 each 9  . Ascorbic Acid (VITAMIN C) 1000 MG tablet Take 1 tablet by mouth daily.    [name] aspirin 81 MG tablet Take 81 mg by mouth daily.      . Blood Glucose Monitoring Suppl (FREESTYLE LITE) DEVI   0  . celecoxib (CELEBREX) 200 MG capsule One to 2 tablets by mouth daily as needed for pain. 60 capsule 2  . Cyanocobalamin (B-12) 500 MCG TABS Take 1 tablet by mouth daily.    [name] FOLIC  ACID PO Take Q000111Q mcg by mouth daily.     [name] losartan-hydrochlorothiazide (HYZAAR) 100-25 MG tablet TAKE 1 TABLET BY MOUTH DAILY. 90 tablet 0  . metFORMIN (GLUCOPHAGE) 1000 MG tablet TAKE 1 TABLET BY MOUTH 2 TIMES DAILY WITH A MEAL. 180 tablet 0  . Multiple Vitamin (MULTIVITAMIN) tablet Take 1 tablet by mouth daily.      . Omega-3 Fatty Acids (FISH OIL) 1000 MG CAPS Take 1,000 mg by mouth daily.     . pravastatin (PRAVACHOL) 40 MG tablet TAKE 1 TABLET BY MOUTH ONCE DAILY 90 tablet 1  . pyridOXINE (VITAMIN B-6) 100 MG tablet Take 100 mg by mouth daily.    . TRADJENTA 5 MG TABS tablet TAKE 1 TABLET (5 MG TOTAL) BY MOUTH DAILY. 90 tablet 1  . Zinc Sulfate (ZINC 15 PO) Take by mouth.      . clindamycin (CLEOCIN) 150 MG capsule Take 1 capsule (150 mg total) by mouth 3 (three) times daily for 5 days. 15 capsule 0  . cyclobenzaprine (FLEXERIL) 10 MG tablet Take 0.5-1 tablets (5-10 mg total) by mouth at bedtime as needed for muscle spasms. 30 tablet 0  . fluticasone (FLONASE) 50 MCG/ACT nasal spray Place 2 sprays into both nostrils daily. 16 g 6   No facility-administered medications prior to visit.     Allergies  Allergen Reactions  . Sulfonamide Derivatives Hives    REACTION: hives  . Xigduo Xr [Dapagliflozin-Metformin Hcl Er] Hives  . Hycodan [Hydrocodone-Homatropine] Itching  . Cefaclor Rash  . Cephalexin Rash  . Januvia [Sitagliptin] Nausea Only    Back pain  . Levofloxacin Itching and Rash  . Penicillins Other (See Comments)    unknown  . Tetracycline Nausea Only    GI upset  . Victoza [Liraglutide] Other (See Comments)    Back pain    ROS     Objective:    Physical Exam  Constitutional: She is oriented to person, place, and time. She appears well-developed and well-nourished.  HENT:  Head: Normocephalic and atraumatic.  Eyes: Conjunctivae and EOM are normal.  Cardiovascular: Normal rate.  Pulmonary/Chest: Effort normal.  Right breast with a erythematous area with a well  demarcated irregular border with a little bit of fine scale.  No pustules or vesicles.  No open wound or drainage.  Do not  palpate any lesions or mass or abscess underneath the area.  There is a circle still drawn right around the area and it has not spread from the area but has not also gotten smaller.  Neurological: She is alert and oriented to person, place, and time.  Skin: Skin is dry. No pallor.  Psychiatric: She has a normal mood and affect. Her behavior is normal.  Vitals reviewed.   BP 137/66   Pulse 86   Ht 5\' 1"  (1.549 m)   Wt 167 lb (75.8 kg)   LMP 01/27/2000 (Approximate)   SpO2 98%   BMI 31.55 kg/m  Wt Readings from Last 3 Encounters:  10/04/18 167 lb (75.8 kg)  09/30/18 170 lb (77.1 kg)  09/07/18 170 lb (77.1 kg)    Health Maintenance Due  Topic Date Due  . OPHTHALMOLOGY EXAM  05/04/2018  . FOOT EXAM  06/30/2018    There are no preventive care reminders to display for this patient.   Lab Results  Component Value Date   TSH 0.96 11/25/2017   Lab Results  Component Value Date   WBC 8.4 11/25/2017   HGB 13.3 11/25/2017   HCT 39.7 11/25/2017   MCV 87.6 11/25/2017   PLT 288 11/25/2017   Lab Results  Component Value Date   NA 140 03/28/2018   K 4.0 03/28/2018   CO2 26 03/28/2018   GLUCOSE 131 (H) 03/28/2018   BUN 12 03/28/2018   CREATININE 0.63 03/28/2018   BILITOT 0.4 03/28/2018   ALKPHOS 76 10/29/2015   AST 27 03/28/2018   ALT 41 (H) 03/28/2018   PROT 6.8 03/28/2018   ALBUMIN 4.4 10/29/2015   CALCIUM 10.7 (H) 03/28/2018   Lab Results  Component Value Date   CHOL 158 03/28/2018   Lab Results  Component Value Date   HDL 49 (L) 03/28/2018   Lab Results  Component Value Date   LDLCALC 84 03/28/2018   Lab Results  Component Value Date   TRIG 146 03/28/2018   Lab Results  Component Value Date   CHOLHDL 3.2 03/28/2018   Lab Results  Component Value Date   HGBA1C 6.8 (A) 08/08/2018       Assessment & Plan:   Problem List Items  Addressed This Visit    None    Visit Diagnoses    Rash, skin    -  Primary     Rash on right back breast at the biopsy site though it did not appear until 1 week later.  Unclear.  And this looks more like a contact dermatitis visually.  Some to treat with Lotrisone.  And also can change antibiotics to doxycycline.  If not improving by the end of the week then please let me know if that occurs and I am to send her back to the breast center so they can do an ultrasound to make sure that there may not be an underlying problem such as a seroma etc.  Though I did not palpate any fullness or lesion underneath the rash.   Meds ordered this encounter  Medications  . doxycycline (VIBRA-TABS) 100 MG tablet    Sig: Take 1 tablet (100 mg total) by mouth 2 (two) times daily.    Dispense:  20 tablet    Refill:  0  . clotrimazole-betamethasone (LOTRISONE) cream    Sig: Apply 1 application topically 2 (two) times daily. X 7 days.    Dispense:  30 g    Refill:  0  Beatrice Lecher, MD

## 2018-10-06 ENCOUNTER — Ambulatory Visit: Payer: 59 | Admitting: Rehabilitative and Restorative Service Providers"

## 2018-10-06 ENCOUNTER — Other Ambulatory Visit: Payer: Self-pay

## 2018-10-06 ENCOUNTER — Encounter: Payer: Self-pay | Admitting: Rehabilitative and Restorative Service Providers"

## 2018-10-06 DIAGNOSIS — M5416 Radiculopathy, lumbar region: Secondary | ICD-10-CM

## 2018-10-06 DIAGNOSIS — R29898 Other symptoms and signs involving the musculoskeletal system: Secondary | ICD-10-CM | POA: Diagnosis not present
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
## 2018-10-06 NOTE — Patient Instructions (Addendum)
Access Code: [email or web address]  URL: [email or web address]  Date: 10/06/2018  Prepared by: Gillermo Murdoch   Exercises  Prone Press Up - 10 reps - 1 sets - 2-3 sec hold - 2x daily - 7x weekly  Supine Hamstring Stretch with Strap - 10 reps - 1 sets - 30 seconds hold - 2x daily - 7x weekly  Supine ITB Stretch with Strap - 3 reps - 1 sets - 30 seconds hold - 2x daily - 7x weekly  Supine Piriformis Stretch with Leg Straight - 3 reps - 1 sets - 30 seconds hold - 2x daily - 7x weekly  Prone Quadriceps Stretch with Strap - 3 reps - 1 sets - 30 seconds hold - 2x daily - 7x weekly  Hooklying Transversus Abdominis Palpation - 10 reps - 1 sets - 10 sec hold - 2x daily - 7x weekly  Patient Education  TENS Unit  Posture and Body Mechanics    Trigger Point Dry Needling  . What is Trigger Point Dry Needling (DN)? o DN is a physical therapy technique used to treat muscle pain and dysfunction. Specifically, DN helps deactivate muscle trigger points (muscle knots).  o A thin filiform needle is used to penetrate the skin and stimulate the underlying trigger point. The goal is for a local twitch response (LTR) to occur and for the trigger point to relax. No medication of any kind is injected during the procedure.   . What Does Trigger Point Dry Needling Feel Like?  o The procedure feels different for each individual patient. Some patients report that they do not actually feel the needle enter the skin and overall the process is not painful. Very mild bleeding may occur. However, many patients feel a deep cramping in the muscle in which the needle was inserted. This is the local twitch response.   [name] How Will I feel after the treatment? o Soreness is normal, and the onset of soreness may not occur for a few hours. Typically this soreness does not last longer than two days.  o Bruising is uncommon, however; ice can be used to decrease any possible bruising.  o In rare cases feeling tired or nauseous after the  treatment is normal. In addition, your symptoms may get worse before they get better, this period will typically not last longer than 24 hours.   . What Can I do After My Treatment? o Increase your hydration by drinking more water for the next 24 hours. o You may place ice or heat on the areas treated that have become sore, however, do not use heat on inflamed or bruised areas. Heat often brings more relief post needling. o You can continue your regular activities, but vigorous activity is not recommended initially after the treatment for 24 hours. o DN is best combined with other physical therapy such as strengthening, stretching, and other therapies.

## 2018-10-13 ENCOUNTER — Encounter: Payer: 59 | Admitting: Physical Therapy

## 2018-10-20 ENCOUNTER — Encounter: Payer: 59 | Admitting: Rehabilitative and Restorative Service Providers"

## 2018-10-24 ENCOUNTER — Other Ambulatory Visit: Payer: Self-pay | Admitting: Family Medicine

## 2018-10-24 MED FILL — metFORMIN HCL 1000 MG TABS: 1000 | 90 days supply | Qty: 180 | Fill #0

## 2018-10-24 MED FILL — PRAVASTATIN NA 40 MG TAB: 40 | 90 days supply | Qty: 90 | Fill #1

## 2018-10-24 MED FILL — ACCU-CHEK GUIDE TEST STRIP: 50 days supply | Qty: 100 | Fill #1

## 2018-10-24 MED FILL — LOSARTAN-HCTZ 100-25 MG TAB: 100-25 | 90 days supply | Qty: 90 | Fill #0

## 2018-10-27 ENCOUNTER — Ambulatory Visit: Payer: 59 | Admitting: Physical Therapy

## 2018-10-27 ENCOUNTER — Encounter: Payer: Self-pay | Admitting: Physical Therapy

## 2018-10-27 ENCOUNTER — Other Ambulatory Visit: Payer: Self-pay

## 2018-10-27 DIAGNOSIS — M5416 Radiculopathy, lumbar region: Secondary | ICD-10-CM | POA: Diagnosis not present

## 2018-10-27 DIAGNOSIS — R29898 Other symptoms and signs involving the musculoskeletal system: Secondary | ICD-10-CM

## 2018-10-27 NOTE — Therapy (Addendum)
[place], [place], [address] Phone: [phone]   Fax:  [phone]  Physical Therapy Treatment  Patient Details  Name: Maria Nunez MRN: [id number] Date of Birth: 05/19/1960 Referring Provider (PT): Dr Madilyn Fireman   Encounter Date: 10/27/2018  PT End of Session - 10/27/18 1707    Visit Number  2    Number of Visits  12    Date for PT Re-Evaluation  11/17/18    PT Start Time  1701    PT Stop Time  1746    PT Time Calculation (min)  45 min    Activity Tolerance  Patient tolerated treatment well    Behavior During Therapy  [facility] for tasks assessed/performed       Past Medical History:  Diagnosis Date  . Abnormal ultrasound of ovary 2016   Ultrasound showing bilateral ovaries in contact with one another.  Asymptomatic.  [name] Allergy   . Diabetes mellitus    borderline  . Fibroid   . Heart murmur   . Hidradenitis   . Hypertension     Past Surgical History:  Procedure Laterality Date  . ABDOMINAL HYSTERECTOMY     supracervical  . BREAST EXCISIONAL BIOPSY Right   . BREAST SURGERY     cysts removed right breast  . CARPAL TUNNEL RELEASE Right 01/08/2017   Procedure: CARPAL TUNNEL RELEASE, POSSIBLE RIGHT THUMB CARPOMETACARPAL INJECTION;  Surgeon: Roseanne Kaufman, MD;  Location: [place];  Service: Orthopedics;  Laterality: Right;  60 MINS  . CESAREAN SECTION    . INGUINAL HIDRADENITIS EXCISION    . LEEP  05/04/13   HPV change    There were no vitals filed for this visit.  Subjective Assessment - 10/27/18 1707    Subjective  Pt reports she was sick last week, and then moved her daughter last weekend.  Began having pain down her RLE; resolved 1 day later with medicine and ice. She is only doing exercises 2x/wk.    Patient Stated Goals  get rid of the pain    Currently in Pain?  No/denies    Pain Score  0-No pain    Pain Location  Back         [facility] PT Assessment -  10/27/18 0001      Assessment   Medical Diagnosis  Lumbar radiculopathy     Referring Provider (PT)  Dr Madilyn Fireman    Onset Date/Surgical Date  08/27/18    Hand Dominance  Right    Next MD Visit  PRN     Prior Therapy  here for elbow and hand        [facility] Adult PT Treatment/Exercise - 10/27/18 0001      Lumbar Exercises: Stretches   Passive Hamstring Stretch  Right;Left;2 reps;30 seconds   seated   Hip Flexor Stretch  Right;Left;2 reps;30 seconds   seated   Standing Extension  3 reps;5 seconds    Quad Stretch Limitations  trial in sitting; bothered back- stopped after 5 sec    Piriformis Stretch  Right;Left;20 seconds   supine, travell, cues for form   Piriformis Stretch Limitations  trial in sitting x 20 sec each side    Figure 4 Stretch  1 rep;20 seconds   seated     Lumbar Exercises: Aerobic   Nustep  L5: 6 min       Lumbar Exercises: Seated   Other Seated Lumbar Exercises  sit to stand x 3 reps  with core engaged.       Lumbar Exercises: Supine   Ab Set  5 reps;5 seconds      Moist Heat Therapy   Number Minutes Moist Heat  12 Minutes    Moist Heat Location  Lumbar Spine;Hip      Electrical Stimulation   Electrical Stimulation Location  bilat lumbar; Rt posterior hip     Electrical Stimulation Action  IFC    Electrical Stimulation Parameters  to tolerance    Electrical Stimulation Goals  Pain             PT Education - 10/27/18 1756    Education Details  HEP    Person(s) Educated  Patient    Methods  Explanation;Handout;Demonstration;Verbal cues    Comprehension  Verbalized understanding;Returned demonstration          PT Long Term Goals - 10/06/18 1720      PT LONG TERM GOAL #1   Title  Increase lumbar and LE mobility and ROM to WL's and pain free    Time  6    Period  Weeks    Status  New      PT LONG TERM GOAL #2   Title  Decrease LBP and Rt LE radicular pain to 0/10 for functional activities including walking    Time  6    Period  Weeks     Status  New      PT LONG TERM GOAL #3   Title  Patient to demonstrate and verbalize proper lifting techniques    Time  6    Period  Weeks    Status  New      PT LONG TERM GOAL #4   Title  Independent in HEP    Time  6    Period  Weeks    Status  New      PT LONG TERM GOAL #5   Title  Improve FOTO to </= 16% limitaton    Time  6    Period  Weeks    Status  New            Plan - 10/27/18 1744    Clinical Impression Statement  Pt reporting limited compliance with HEP since last visit.  pt reported increase in LBP with attempts at seated version of LE stretches; will continue to perform supine version.  LBP decrease with use of estim/MHP at end of session. Goals are ongoing at this time.    Stability/Clinical Decision Making  Stable/Uncomplicated    Rehab Potential  Good    PT Frequency  2x / week    PT Duration  6 weeks    PT Treatment/Interventions  Patient/family education;ADLs/Self Care Home Management;Cryotherapy;Electrical Stimulation;Iontophoresis 16m/ml Dexamethasone;Moist Heat;Ultrasound;Joint Manipulations;Manual techniques;Dry needling;Neuromuscular re-education;Functional mobility training;Therapeutic activities;Therapeutic exercise    PT Next Visit Plan  progress with core stabilization; add deep tissue work vs DN Rt posterior hip; back care education; education in myofacial ball release work; mMudloggerand Agree with Plan of Care  Patient       Patient will benefit from skilled therapeutic intervention in order to improve the following deficits and impairments:  Pain, Increased muscle spasms, Increased fascial restricitons, Improper body mechanics, Postural dysfunction, Decreased strength, Decreased mobility, Decreased range of motion, Decreased activity tolerance  Visit Diagnosis: Radiculopathy, lumbar region  Other symptoms and signs involving the musculoskeletal system     Problem List Patient Active Problem List   Diagnosis Date Noted   .  Pain in right hand 02/21/2018  . Bursitis of left shoulder 01/24/2016  . GAD (generalized anxiety disorder) 04/02/2015  . Localized primary osteoarthritis of carpometacarpal joint of right thumb 03/15/2015  . Carpal tunnel syndrome, bilateral 04/05/2013  . LGSIL (low grade squamous intraepithelial dysplasia) 06/16/2012  . Type 2 diabetes mellitus with other specified complication ([place]) 56/21/3086  . PANIC ATTACK 09/07/2008  . RESTLESS LEG SYNDROME 04/02/2008  . POLYARTHRITIS 04/02/2008  . Primary osteoarthritis of left knee 01/02/2008  . HOT FLASHES 08/04/2006  . Hyperlipidemia associated with type 2 diabetes mellitus ([place]) 11/03/2005  . HYPERTENSION, BENIGN SYSTEMIC 11/03/2005   Kerin Perna, PTA 10/27/18 5:56 PM [place] [facility] [employer] [place], [place], [address] Phone: [phone]   Fax:  [phone]  Name: Maria Nunez MRN: [id number] Date of Birth: 16-Nov-1960  PHYSICAL THERAPY DISCHARGE SUMMARY  Visits from Start of Care: 2  Current functional level related to goals / functional outcomes: See progress note for discharge status    Remaining deficits: No known deficits    Education / Equipment: HEP  Plan: Patient agrees to discharge.  Patient goals were met. Patient is being discharged due to being pleased with the current functional level.  ?????    Celyn P. Helene Kelp PT, MPH 04/25/19 12:13 PM

## 2018-10-27 NOTE — Patient Instructions (Signed)
Hamstring Step 3    Left leg in maximal straight leg raise, then straighten knee further by tightening knee cap. Warning: Intense stretch. Stay within tolerance. Hold _30__ seconds. Relax knee cap only. Repeat _2__ times.  Trunk Extension    Standing, place back of open hands on low back. Straighten spine then arch the back and move shoulders back. Repeat __2-3__ times per session. Do __3__ sessions per day.

## 2018-10-28 ENCOUNTER — Ambulatory Visit: Payer: 59 | Admitting: Sports Medicine

## 2018-11-01 ENCOUNTER — Ambulatory Visit: Payer: 59 | Admitting: Sports Medicine

## 2018-11-01 ENCOUNTER — Other Ambulatory Visit: Payer: Self-pay

## 2018-11-01 DIAGNOSIS — M1811 Unilateral primary osteoarthritis of first carpometacarpal joint, right hand: Secondary | ICD-10-CM | POA: Diagnosis not present

## 2018-11-01 NOTE — Progress Notes (Signed)
Subjective:    CC: Right hand pain  HPI: This is a very pleasant 58 year old female with thumb basal joint osteoarthritis, previous injection was 6 months ago, now having recurrence of pain, moderate, persistent, localized without radiation.  I reviewed the past medical history, family history, social history, surgical history, and allergies today and no changes were needed.  Please see the problem list section below in epic for further details.  Past Medical History: Past Medical History:  Diagnosis Date  . Abnormal ultrasound of ovary 2016   Ultrasound showing bilateral ovaries in contact with one another.  Asymptomatic.  [name] Allergy   . Diabetes mellitus    borderline  . Fibroid   . Heart murmur   . Hidradenitis   . Hypertension    Past Surgical History: Past Surgical History:  Procedure Laterality Date  . ABDOMINAL HYSTERECTOMY     supracervical  . BREAST EXCISIONAL BIOPSY Right   . BREAST SURGERY     cysts removed right breast  . CARPAL TUNNEL RELEASE Right 01/08/2017   Procedure: CARPAL TUNNEL RELEASE, POSSIBLE RIGHT THUMB CARPOMETACARPAL INJECTION;  Surgeon: Roseanne Kaufman, MD;  Location: [place];  Service: Orthopedics;  Laterality: Right;  60 MINS  . CESAREAN SECTION    . INGUINAL HIDRADENITIS EXCISION    . LEEP  05/04/13   HPV change   Social History: Social History   Socioeconomic History  . Marital status: Divorced    Spouse name: Not on file  . Number of children: Not on file  . Years of education: Not on file  . Highest education level: Not on file  Occupational History  . Not on file  Social Needs  . Financial resource strain: Not on file  . Food insecurity    Worry: Not on file    Inability: Not on file  . Transportation needs    Medical: Not on file    Non-medical: Not on file  Tobacco Use  . Smoking status: Never Smoker  . Smokeless tobacco: Never Used  Substance and Sexual Activity  . Alcohol use: No    Alcohol/week: 0.0  standard drinks  . Drug use: No  . Sexual activity: Not Currently    Partners: Male    Birth control/protection: Surgical    Comment: TAH/supracervical  Lifestyle  . Physical activity    Days per week: Not on file    Minutes per session: Not on file  . Stress: Not on file  Relationships  . Social Herbalist on phone: Not on file    Gets together: Not on file    Attends religious service: Not on file    Active member of club or organization: Not on file    Attends meetings of clubs or organizations: Not on file    Relationship status: Not on file  Other Topics Concern  . Not on file  Social History Narrative  . Not on file   Family History: Family History  Problem Relation Age of Onset  . Asthma Mother   . COPD Mother   . Diabetes Mother   . Hypertension Mother   . Diabetes Father   . Hypertension Father   . Breast cancer Maternal Aunt   . Breast cancer Paternal Aunt    Allergies: Allergies  Allergen Reactions  . Sulfonamide Derivatives Hives    REACTION: hives  . Xigduo Xr [Dapagliflozin-Metformin Hcl Er] Hives  . Hycodan [Hydrocodone-Homatropine] Itching  . Cefaclor Rash  . Cephalexin Rash  .  Januvia [Sitagliptin] Nausea Only    Back pain  . Levofloxacin Itching and Rash  . Penicillins Other (See Comments)    unknown  . Tetracycline Nausea Only    GI upset  . Victoza [Liraglutide] Other (See Comments)    Back pain   Medications: See med rec.  Review of Systems: No fevers, chills, night sweats, weight loss, chest pain, or shortness of breath.   Objective:    General: Well Developed, well nourished, and in no acute distress.  Neuro: Alert and oriented x3, extra-ocular muscles intact, sensation grossly intact.  HEENT: Normocephalic, atraumatic, pupils equal round reactive to light, neck supple, no masses, no lymphadenopathy, thyroid nonpalpable.  Skin: Warm and dry, no rashes. Cardiac: Regular rate and rhythm, no murmurs rubs or gallops, no  lower extremity edema.  Respiratory: Clear to auscultation bilaterally. Not using accessory muscles, speaking in full sentences. Right hand: Tender to palpation at the thumb basal joint.  Procedure: Real-time Ultrasound Guided injection of the right thumb basal joint Device: GE Logiq E  Verbal informed consent obtained.  Time-out conducted.  Noted no overlying erythema, induration, or other signs of local infection.  Skin prepped in a sterile fashion.  Local anesthesia: Topical Ethyl chloride.  With sterile technique and under real time ultrasound guidance:  1/2 cc Kenalog 40, 1/2 cc lidocaine injected easily Completed without difficulty  Pain immediately resolved suggesting accurate placement of the medication.  Advised to call if fevers/chills, erythema, induration, drainage, or persistent bleeding.  Images permanently stored and available for review in the ultrasound unit.  Impression: Technically successful ultrasound guided injection.  Impression and Recommendations:    Localized primary osteoarthritis of carpometacarpal joint of right thumb Right first CMC injection as above, previously injected 6 months ago.   ___________________________________________ Gwen Her. Dianah Field, M.D., ABFM., CAQSM. Primary Care and Sports Medicine  [facility]  Adjunct Professor of [employer] [place]

## 2018-11-01 NOTE — Assessment & Plan Note (Signed)
Right first CMC injection as above, previously injected 6 months ago.

## 2018-11-03 ENCOUNTER — Encounter: Payer: 59 | Admitting: Physical Therapy

## 2018-11-08 ENCOUNTER — Other Ambulatory Visit: Payer: Self-pay

## 2018-11-08 ENCOUNTER — Ambulatory Visit (INDEPENDENT_AMBULATORY_CARE_PROVIDER_SITE_OTHER): Payer: 59 | Admitting: Family Medicine

## 2018-11-08 ENCOUNTER — Encounter: Payer: Self-pay | Admitting: Family Medicine

## 2018-11-08 VITALS — BP 128/64 | HR 79 | Ht 61.0 in | Wt 164.0 lb

## 2018-11-08 DIAGNOSIS — F439 Reaction to severe stress, unspecified: Secondary | ICD-10-CM | POA: Diagnosis not present

## 2018-11-08 DIAGNOSIS — I10 Essential (primary) hypertension: Secondary | ICD-10-CM | POA: Diagnosis not present

## 2018-11-08 DIAGNOSIS — Z23 Encounter for immunization: Secondary | ICD-10-CM | POA: Diagnosis not present

## 2018-11-08 DIAGNOSIS — E1169 Type 2 diabetes mellitus with other specified complication: Secondary | ICD-10-CM

## 2018-11-08 LAB — POCT GLYCOSYLATED HEMOGLOBIN (HGB A1C): Hemoglobin A1C: 6.6 % — AB (ref 4.0–5.6)

## 2018-11-08 NOTE — Progress Notes (Signed)
Established Patient Office Visit  Subjective:  Patient ID: Maria Nunez, female    DOB: [birth year]  Age: 58 y.o. MRN: [id number]  CC:  Chief Complaint  Patient presents with  . Diabetes    HPI [name] presents for   Diabetes - no hypoglycemic events. No wounds or sores that are not healing well. No increased thirst or urination. Checking glucose at home. Taking medications as prescribed without any side effects.  She discontinued her Tradjenta about a month ago she was having hypoglycemic events.  She has been doing well since then she has been walking daily for exercise.  Hypertension- Pt denies chest pain, SOB, dizziness, or heart palpitations.  Taking meds as directed w/o problems.  Denies medication side effects.    She has been under some stress recently.  Unfortunately her daughter was hospitalized for suicide attempt.  She had just been really stressed.  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  She has been with her and very supportive and her daughter is actually doing really well female but says that she has been tiring exhausting physically and emotionally.   Past Medical History:  Diagnosis Date  . Abnormal ultrasound of ovary 2016   Ultrasound showing bilateral ovaries in contact with one another.  Asymptomatic.  [name] Allergy   . Diabetes mellitus    borderline  . Fibroid   . Heart murmur   . Hidradenitis   . Hypertension     Past Surgical History:  Procedure Laterality Date  . ABDOMINAL HYSTERECTOMY     supracervical  . BREAST EXCISIONAL BIOPSY Right   . BREAST SURGERY     cysts removed right breast  . CARPAL TUNNEL RELEASE Right 01/08/2017   Procedure: CARPAL TUNNEL RELEASE, POSSIBLE RIGHT THUMB CARPOMETACARPAL INJECTION;  Surgeon: Roseanne Kaufman, MD;  Location: [place];  Service: Orthopedics;  Laterality: Right;  60 MINS  . CESAREAN SECTION    . INGUINAL HIDRADENITIS EXCISION    . LEEP  05/04/13   HPV change    Family  History  Problem Relation Age of Onset  . Asthma Mother   . COPD Mother   . Diabetes Mother   . Hypertension Mother   . Diabetes Father   . Hypertension Father   . Breast cancer Maternal Aunt   . Breast cancer Paternal Aunt     Social History   Socioeconomic History  . Marital status: Divorced    Spouse name: Not on file  . Number of children: Not on file  . Years of education: Not on file  . Highest education level: Not on file  Occupational History  . Not on file  Social Needs  . Financial resource strain: Not on file  . Food insecurity    Worry: Not on file    Inability: Not on file  . Transportation needs    Medical: Not on file    Non-medical: Not on file  Tobacco Use  . Smoking status: Never Smoker  . Smokeless tobacco: Never Used  Substance and Sexual Activity  . Alcohol use: No    Alcohol/week: 0.0 standard drinks  . Drug use: No  . Sexual activity: Not Currently    Partners: Male    Birth control/protection: Surgical    Comment: TAH/supracervical  Lifestyle  . Physical activity    Days per week: Not on file    Minutes per session: Not on file  . Stress: Not on file  Relationships  .  Social Herbalist on phone: Not on file    Gets together: Not on file    Attends religious service: Not on file    Active member of club or organization: Not on file    Attends meetings of clubs or organizations: Not on file    Relationship status: Not on file  . Intimate partner violence    Fear of current or ex partner: Not on file    Emotionally abused: Not on file    Physically abused: Not on file    Forced sexual activity: Not on file  Other Topics Concern  . Not on file  Social History Narrative  . Not on file    Outpatient Medications Prior to Visit  Medication Sig Dispense Refill  . ACCU-CHEK FASTCLIX LANCETS MISC For use when checking blood sugars up to four times daily. Dx: E11.9 4 each 11  . ACCU-CHEK GUIDE test strip USE TO TEST BLOOD SUGAR  TWICE DAILY 100 each 9  . Ascorbic Acid (VITAMIN C) 1000 MG tablet Take 1 tablet by mouth daily.    [name] aspirin 81 MG tablet Take 81 mg by mouth daily.      . Blood Glucose Monitoring Suppl (FREESTYLE LITE) DEVI   0  . celecoxib (CELEBREX) 200 MG capsule One to 2 tablets by mouth daily as needed for pain. 60 capsule 2  . Cyanocobalamin (B-12) 500 MCG TABS Take 1 tablet by mouth daily.    [name] FOLIC ACID PO Take [id number] mcg by mouth daily.     [name] losartan-hydrochlorothiazide (HYZAAR) 100-25 MG tablet TAKE 1 TABLET BY MOUTH DAILY. 90 tablet 0  . metFORMIN (GLUCOPHAGE) 1000 MG tablet TAKE 1 TABLET BY MOUTH 2 TIMES DAILY WITH A MEAL. 180 tablet 0  . Multiple Vitamin (MULTIVITAMIN) tablet Take 1 tablet by mouth daily.      . Omega-3 Fatty Acids (FISH OIL) 1000 MG CAPS Take 1,000 mg by mouth daily.     . pravastatin (PRAVACHOL) 40 MG tablet TAKE 1 TABLET BY MOUTH ONCE DAILY 90 tablet 1  . pyridOXINE (VITAMIN B-6) 100 MG tablet Take 100 mg by mouth daily.    . Zinc Sulfate (ZINC 15 PO) Take by mouth.      . TRADJENTA 5 MG TABS tablet TAKE 1 TABLET (5 MG TOTAL) BY MOUTH DAILY. 90 tablet 1   No facility-administered medications prior to visit.     Allergies  Allergen Reactions  . Sulfonamide Derivatives Hives    REACTION: hives  . Xigduo Xr [Dapagliflozin-Metformin Hcl Er] Hives  . Hycodan [Hydrocodone-Homatropine] Itching  . Cefaclor Rash  . Cephalexin Rash  . Januvia [Sitagliptin] Nausea Only    Back pain  . Levofloxacin Itching and Rash  . Penicillins Other (See Comments)    unknown  . Tetracycline Nausea Only    GI upset  . Victoza [Liraglutide] Other (See Comments)    Back pain    ROS Review of Systems    Objective:    Physical Exam  Constitutional: She is oriented to person, place, and time. She appears well-developed and well-nourished.  HENT:  Head: Normocephalic and atraumatic.  Cardiovascular: Normal rate, regular rhythm and normal heart sounds.  Pulmonary/Chest: Effort normal  and breath sounds normal.  Neurological: She is alert and oriented to person, place, and time.  Skin: Skin is warm and dry.  Psychiatric: She has a normal mood and affect. Her behavior is normal.    BP 128/64   Pulse 79  Ht 5\' 1"  (1.549 m)   Wt 164 lb (74.4 kg)   LMP 01/27/2000 (Approximate)   SpO2 98%   BMI 30.99 kg/m  Wt Readings from Last 3 Encounters:  11/08/18 164 lb (74.4 kg)  11/01/18 168 lb (76.2 kg)  10/04/18 167 lb (75.8 kg)     Health Maintenance Due  Topic Date Due  . OPHTHALMOLOGY EXAM  05/04/2018  . FOOT EXAM  06/30/2018    There are no preventive care reminders to display for this patient.  Lab Results  Component Value Date   TSH 0.96 11/25/2017   Lab Results  Component Value Date   WBC 8.4 11/25/2017   HGB 13.3 11/25/2017   HCT 39.7 11/25/2017   MCV 87.6 11/25/2017   PLT 288 11/25/2017   Lab Results  Component Value Date   NA 140 03/28/2018   K 4.0 03/28/2018   CO2 26 03/28/2018   GLUCOSE 131 (H) 03/28/2018   BUN 12 03/28/2018   CREATININE 0.63 03/28/2018   BILITOT 0.4 03/28/2018   ALKPHOS 76 10/29/2015   AST 27 03/28/2018   ALT 41 (H) 03/28/2018   PROT 6.8 03/28/2018   ALBUMIN 4.4 10/29/2015   CALCIUM 10.7 (H) 03/28/2018   Lab Results  Component Value Date   CHOL 158 03/28/2018   Lab Results  Component Value Date   HDL 49 (L) 03/28/2018   Lab Results  Component Value Date   LDLCALC 84 03/28/2018   Lab Results  Component Value Date   TRIG 146 03/28/2018   Lab Results  Component Value Date   CHOLHDL 3.2 03/28/2018   Lab Results  Component Value Date   HGBA1C 6.6 (A) 11/08/2018      Assessment & Plan:   Problem List Items Addressed This Visit      Cardiovascular and Mediastinum   HYPERTENSION, BENIGN SYSTEMIC - Primary    Well controlled. Continue current regimen. Follow up in  6 mo.       Relevant Orders   BASIC METABOLIC PANEL WITH GFR     Endocrine   Type 2 diabetes mellitus with other specified  complication (HCC)    Well controlled. Continue current regimen. Follow up in  4 months.        Relevant Orders   POCT glycosylated hemoglobin (Hb A1C) (Completed)   BASIC METABOLIC PANEL WITH GFR    Other Visit Diagnoses    Need for immunization against influenza       Relevant Orders   Flu Vaccine QUAD 36+ mos IM (Completed)   Stress         Stress - she is feeling better overall now that her daughter is doing better.  Call if not improving.    No orders of the defined types were placed in this encounter.   Follow-up: Return in about 4 months (around 03/11/2019) for Diabetes follow-up.    Beatrice Lecher, MD

## 2018-11-08 NOTE — Assessment & Plan Note (Signed)
Well controlled. Continue current regimen. Follow up in  6 mo  

## 2018-11-08 NOTE — Assessment & Plan Note (Signed)
Well controlled. Continue current regimen. Follow up in  4 months.   

## 2018-11-09 LAB — BASIC METABOLIC PANEL WITH GFR
BUN: 16 mg/dL (ref 7–25)
CO2: 29 mmol/L (ref 20–32)
Calcium: 9.8 mg/dL (ref 8.6–10.4)
Chloride: 102 mmol/L (ref 98–110)
Creat: 0.64 mg/dL (ref 0.50–1.05)
GFR, Est African American: 114 mL/min/{1.73_m2} (ref >=60)
GFR, Est Non African American: 98 mL/min/{1.73_m2} (ref >=60)
Glucose, Bld: 135 mg/dL — ABNORMAL HIGH (ref 65–99)
Potassium: 3.6 mmol/L (ref 3.5–5.3)
Sodium: 140 mmol/L (ref 135–146)

## 2018-11-09 NOTE — Progress Notes (Signed)
All labs are normal. 

## 2018-11-10 ENCOUNTER — Encounter: Payer: 59 | Admitting: Physical Therapy

## 2018-11-26 DIAGNOSIS — M62830 Muscle spasm of back: Secondary | ICD-10-CM | POA: Diagnosis not present

## 2018-11-26 DIAGNOSIS — M546 Pain in thoracic spine: Secondary | ICD-10-CM | POA: Diagnosis not present

## 2018-11-26 DIAGNOSIS — M545 Low back pain: Secondary | ICD-10-CM | POA: Diagnosis not present

## 2018-11-26 DIAGNOSIS — M542 Cervicalgia: Secondary | ICD-10-CM | POA: Diagnosis not present

## 2018-12-08 ENCOUNTER — Encounter: Payer: Self-pay | Admitting: Family Medicine

## 2018-12-08 DIAGNOSIS — M62830 Muscle spasm of back: Secondary | ICD-10-CM | POA: Diagnosis not present

## 2018-12-08 DIAGNOSIS — M542 Cervicalgia: Secondary | ICD-10-CM | POA: Diagnosis not present

## 2018-12-08 DIAGNOSIS — M545 Low back pain: Secondary | ICD-10-CM | POA: Diagnosis not present

## 2018-12-08 DIAGNOSIS — M546 Pain in thoracic spine: Secondary | ICD-10-CM | POA: Diagnosis not present

## 2019-01-13 ENCOUNTER — Telehealth: Payer: Self-pay

## 2019-01-13 ENCOUNTER — Encounter (HOSPITAL_BASED_OUTPATIENT_CLINIC_OR_DEPARTMENT_OTHER): Payer: Self-pay

## 2019-01-13 ENCOUNTER — Emergency Department (HOSPITAL_BASED_OUTPATIENT_CLINIC_OR_DEPARTMENT_OTHER)
Admission: EM | Admit: 2019-01-13 | Discharge: 2019-01-13 | Disposition: A | Discharge status: Home / Self Care | Payer: 59 | Attending: Emergency Medicine | Admitting: Emergency Medicine

## 2019-01-13 ENCOUNTER — Emergency Department (HOSPITAL_BASED_OUTPATIENT_CLINIC_OR_DEPARTMENT_OTHER): Payer: 59

## 2019-01-13 ENCOUNTER — Other Ambulatory Visit: Payer: Self-pay

## 2019-01-13 DIAGNOSIS — Z79899 Other long term (current) drug therapy: Secondary | ICD-10-CM | POA: Insufficient documentation

## 2019-01-13 DIAGNOSIS — R079 Chest pain, unspecified: Secondary | ICD-10-CM

## 2019-01-13 DIAGNOSIS — I10 Essential (primary) hypertension: Secondary | ICD-10-CM | POA: Diagnosis not present

## 2019-01-13 DIAGNOSIS — R072 Precordial pain: Secondary | ICD-10-CM | POA: Diagnosis not present

## 2019-01-13 DIAGNOSIS — Z7984 Long term (current) use of oral hypoglycemic drugs: Secondary | ICD-10-CM | POA: Diagnosis not present

## 2019-01-13 DIAGNOSIS — E119 Type 2 diabetes mellitus without complications: Secondary | ICD-10-CM | POA: Insufficient documentation

## 2019-01-13 DIAGNOSIS — R0789 Other chest pain: Secondary | ICD-10-CM | POA: Insufficient documentation

## 2019-01-13 DIAGNOSIS — Z7982 Long term (current) use of aspirin: Secondary | ICD-10-CM | POA: Insufficient documentation

## 2019-01-13 DIAGNOSIS — R200 Anesthesia of skin: Secondary | ICD-10-CM | POA: Insufficient documentation

## 2019-01-13 HISTORY — DX: Pure hypercholesterolemia, unspecified: E78.00

## 2019-01-13 HISTORY — DX: Chest pain, unspecified: R07.9

## 2019-01-13 LAB — BASIC METABOLIC PANEL
Anion gap: 13 (ref 5–15)
BUN: 21 mg/dL — ABNORMAL HIGH (ref 6–20)
CO2: 29 mmol/L (ref 22–32)
Calcium: 9.6 mg/dL (ref 8.9–10.3)
Chloride: 97 mmol/L — ABNORMAL LOW (ref 98–111)
Creatinine, Ser: 0.69 mg/dL (ref 0.44–1.00)
GFR calc Af Amer: >60 mL/min (ref >60)
GFR calc non Af Amer: >60 mL/min (ref >60)
Glucose, Bld: 113 mg/dL — ABNORMAL HIGH (ref 70–99)
Potassium: 3.7 mmol/L (ref 3.5–5.1)
Sodium: 139 mmol/L (ref 135–145)

## 2019-01-13 LAB — CBC
HCT: 41.3 % (ref 36.0–46.0)
Hemoglobin: 14.1 g/dL (ref 12.0–15.0)
MCH: 29.9 pg (ref 26.0–34.0)
MCHC: 34.1 g/dL (ref 30.0–36.0)
MCV: 87.7 fL (ref 80.0–100.0)
Platelets: 300 10*3/uL (ref 150–400)
RBC: 4.71 MIL/uL (ref 3.87–5.11)
RDW: 12.7 % (ref 11.5–15.5)
WBC: 10.2 10*3/uL (ref 4.0–10.5)
nRBC: 0 % (ref 0.0–0.2)

## 2019-01-13 LAB — TROPONIN I (HIGH SENSITIVITY)
Troponin I (High Sensitivity): <2 ng/L (ref <18)
Troponin I (High Sensitivity): <2 ng/L (ref <18)

## 2019-01-13 MED ORDER — SODIUM CHLORIDE 0.9% FLUSH
3.0000 mL | Freq: Once | INTRAVENOUS | Status: DC
  Filled 2019-01-13: qty 3

## 2019-01-13 NOTE — Telephone Encounter (Signed)
Patient called, states she has had a lot of increased stress lately and had sudden onset of chest pain. Patient's BP was 117/77 but she reports left sided pain under breast and right arm numbness. Some back pain, but thinks it could be due to helping her daughter move.   I advised patient to go to ED to be evaluated. Patient agreeable. States she does have someone who can take her. Will go to [facility]. FYI to PCP

## 2019-01-13 NOTE — Discharge Instructions (Signed)
Please follow up with your doctor °Return if worsening °

## 2019-01-13 NOTE — ED Notes (Signed)
ED Provider at bedside. 

## 2019-01-13 NOTE — ED Provider Notes (Signed)
[facility]PART[facility] Provider Note   CSN: [id number] Arrival date & time: 01/13/19  1414     History Chief Complaint  Patient presents with  . Chest Pain    Maria Nunez is a 58 y.o. female with history of HTN, DM, chronic back pain who presents with chest pain.  She states that she has been having intermittent substernal chest pain for approximately 1 week.  Pain comes and goes.  Is nonradiating.  She describes it as a gas pain or like indigestion.  Nothing makes it better or worse.  She has never had this before.  She states she has been under a lot of stress recently.  She also had some left arm numbness last night which she has never had before but thinks it may be due to problems with her neck or back.  She called her PCP today and they advised her to come to the ED for evaluation.  She had the pain earlier today which felt like a tightness and so she took her blood pressure medicine.  She denies fever, chills, cough, shortness of breath, pain with inspiration, abdominal pain, nausea, vomiting, diaphoresis, leg swelling. No recent surgery/travel/immobilization, hx of cancer, hemoptysis, prior DVT/PE, or hormone use.  She has never had any cardiac evaluation in the past.  HPI     Past Medical History:  Diagnosis Date  . Abnormal ultrasound of ovary 2016   Ultrasound showing bilateral ovaries in contact with one another.  Asymptomatic.  [name] Allergy   . Diabetes mellitus    borderline  . Fibroid   . Heart murmur   . Hidradenitis   . High cholesterol   . Hypertension     Patient Active Problem List   Diagnosis Date Noted  . Pain in right hand 02/21/2018  . Bursitis of left shoulder 01/24/2016  . GAD (generalized anxiety disorder) 04/02/2015  . Localized primary osteoarthritis of carpometacarpal joint of right thumb 03/15/2015  . Carpal tunnel syndrome, bilateral 04/05/2013  . LGSIL (low grade squamous intraepithelial dysplasia) 06/16/2012  . Type  2 diabetes mellitus with other specified complication ([place]) [id number]  . PANIC ATTACK 09/07/2008  . RESTLESS LEG SYNDROME 04/02/2008  . POLYARTHRITIS 04/02/2008  . Primary osteoarthritis of left knee 01/02/2008  . HOT FLASHES 08/04/2006  . Hyperlipidemia associated with type 2 diabetes mellitus ([place]) 11/03/2005  . HYPERTENSION, BENIGN SYSTEMIC 11/03/2005    Past Surgical History:  Procedure Laterality Date  . ABDOMINAL HYSTERECTOMY     supracervical  . BREAST EXCISIONAL BIOPSY Right   . BREAST SURGERY     cysts removed right breast  . CARPAL TUNNEL RELEASE Right 01/08/2017   Procedure: CARPAL TUNNEL RELEASE, POSSIBLE RIGHT THUMB CARPOMETACARPAL INJECTION;  Surgeon: Roseanne Kaufman, MD;  Location: [facility];  Service: Orthopedics;  Laterality: Right;  60 MINS  . CESAREAN SECTION    . INGUINAL HIDRADENITIS EXCISION    . LEEP  05/04/13   HPV change     OB History    Gravida  3   Para  2   Term  2   Preterm      AB  1   Living  2     SAB  1   TAB      Ectopic      Multiple      Live Births              Family History  Problem Relation Age of Onset  . Asthma  Mother   . COPD Mother   . Diabetes Mother   . Hypertension Mother   . Diabetes Father   . Hypertension Father   . Breast cancer Maternal Aunt   . Breast cancer Paternal Aunt     Social History   Tobacco Use  . Smoking status: Never Smoker  . Smokeless tobacco: Never Used  Substance Use Topics  . Alcohol use: No    Alcohol/week: 0.0 standard drinks  . Drug use: No    Home Medications Prior to Admission medications   Medication Sig Start Date End Date Taking? Authorizing Provider  ACCU-CHEK FASTCLIX LANCETS MISC For use when checking blood sugars up to four times daily. Dx: E11.9 06/29/17   Hali Marry, MD  ACCU-CHEK GUIDE test strip USE TO TEST BLOOD SUGAR TWICE DAILY 07/11/18   Hali Marry, MD  Ascorbic Acid (VITAMIN C) 1000 MG tablet Take 1 tablet by  mouth daily. 04/10/18   [provider]  aspirin 81 MG tablet Take 81 mg by mouth daily.      [provider]  Blood Glucose Monitoring Suppl (FREESTYLE LITE) DEVI  02/05/16   [provider]  celecoxib (CELEBREX) 200 MG capsule One to 2 tablets by mouth daily as needed for pain. 05/09/18   Silverio Decamp, MD  Cyanocobalamin (B-12) 500 MCG TABS Take 1 tablet by mouth daily.    [provider]  FOLIC ACID PO Take Q000111Q mcg by mouth daily.     [provider]  losartan-hydrochlorothiazide (HYZAAR) 100-25 MG tablet TAKE 1 TABLET BY MOUTH DAILY. 10/24/18   Hali Marry, MD  metFORMIN (GLUCOPHAGE) 1000 MG tablet TAKE 1 TABLET BY MOUTH 2 TIMES DAILY WITH A MEAL. 10/24/18   Hali Marry, MD  Multiple Vitamin (MULTIVITAMIN) tablet Take 1 tablet by mouth daily.      [provider]  Omega-3 Fatty Acids (FISH OIL) 1000 MG CAPS Take 1,000 mg by mouth daily.     [provider]  pravastatin (PRAVACHOL) 40 MG tablet TAKE 1 TABLET BY MOUTH ONCE DAILY 07/08/18   Hali Marry, MD  pyridOXINE (VITAMIN B-6) 100 MG tablet Take 100 mg by mouth daily.    [provider]  Zinc Sulfate (ZINC 15 PO) Take by mouth.      [provider]    Allergies    Sulfonamide derivatives, Xigduo xr [dapagliflozin-metformin hcl er], Hycodan [hydrocodone-homatropine], Cefaclor, Cephalexin, Januvia [sitagliptin], Levofloxacin, Penicillins, Tetracycline, and Victoza [liraglutide]  Review of Systems   Review of Systems  Constitutional: Negative for chills, diaphoresis and fever.  Respiratory: Positive for chest tightness. Negative for cough, shortness of breath and wheezing.   Cardiovascular: Positive for chest pain. Negative for leg swelling.  Gastrointestinal: Negative for abdominal pain, nausea and vomiting.  Musculoskeletal: Positive for back pain (chronic).  All other systems reviewed and are negative.   Physical  Exam Updated Vital Signs BP (!) 186/77 (BP Location: Right Arm)   Pulse 90   Temp 98.8 F (37.1 C) (Oral)   Resp 18   Ht 4\' 11"  (1.499 m)   Wt 76.2 kg   LMP 01/27/2000 (Approximate)   SpO2 96%   BMI 33.93 kg/m   Physical Exam Vitals and nursing note reviewed.  Constitutional:      General: She is not in acute distress.    Appearance: Normal appearance. She is well-developed. She is not ill-appearing.  HENT:     Head: Normocephalic and atraumatic.  Eyes:  General: No scleral icterus.       Right eye: No discharge.        Left eye: No discharge.     Conjunctiva/sclera: Conjunctivae normal.     Pupils: Pupils are equal, round, and reactive to light.  Cardiovascular:     Rate and Rhythm: Normal rate and regular rhythm.  Pulmonary:     Effort: Pulmonary effort is normal. No respiratory distress.     Breath sounds: Normal breath sounds.  Abdominal:     General: There is no distension.     Palpations: Abdomen is soft.     Tenderness: There is no abdominal tenderness.  Musculoskeletal:     Cervical back: Normal range of motion.     Right lower leg: No edema.     Left lower leg: No edema.  Skin:    General: Skin is warm and dry.  Neurological:     Mental Status: She is alert and oriented to person, place, and time.  Psychiatric:        Behavior: Behavior normal.     ED Results / Procedures / Treatments   Labs (all labs ordered are listed, but only abnormal results are displayed) Labs Reviewed  BASIC METABOLIC PANEL - Abnormal; Notable for the following components:      Result Value   Chloride 97 (*)    Glucose, Bld 113 (*)    BUN 21 (*)    All other components within normal limits  CBC  TROPONIN I (HIGH SENSITIVITY)  TROPONIN I (HIGH SENSITIVITY)    EKG None  Radiology DG Chest 2 View  Result Date: 01/13/2019 CLINICAL DATA:  Chest pain extending into the left arm for 2-3 days. EXAM: CHEST - 2 VIEW COMPARISON:  None. FINDINGS: The heart size and  mediastinal contours are normal. The lungs are clear. There is no pleural effusion or pneumothorax. No acute osseous findings are identified. Telemetry leads overlie the chest. IMPRESSION: No active cardiopulmonary process. Electronically Signed   By: Richardean Sale M.D.   On: 01/13/2019 15:31    Procedures Procedures (including critical care time)  Medications Ordered in ED Medications  sodium chloride flush (NS) 0.9 % injection 3 mL (has no administration in time range)    ED Course  I have reviewed the triage vital signs and the nursing notes.  Pertinent labs & imaging results that were available during my care of the patient were reviewed by me and considered in my medical decision making (see chart for details).  58 year old female presents with intermittent chest pain for 5 days with left arm numbness last night. She is hypertensive but otherwise vitals are normal. Exam is unremarkable. EKG is normal sinus rhythm. Pain is atypical sounding. DDx: ACS, PE, pneumonia, GERD, MSK, endocarditis, myo/pericarditis, esophageal rupture, tension pneumothorax, aortic dissection, cardiac tamponade, cocaine induced. Will obtain labs, CXR  Labs are reassuring. Trop is <2. CXR is normal. Will obtain 2nd trop.   2nd trop is normal. Will have her f/u with PCP.    MDM Rules/Calculators/A&P                      Final Clinical Impression(s) / ED Diagnoses Final diagnoses:  Nonspecific chest pain    Rx / DC Orders ED Discharge Orders    None       Recardo Evangelist, PA-C 01/13/19 Maria Nunez    Charlesetta Shanks, MD 01/14/19 1240

## 2019-01-13 NOTE — ED Triage Notes (Addendum)
C/o CP x 3-4 days-denies fever/flu sx-NAD-steady gait

## 2019-01-17 ENCOUNTER — Telehealth: Payer: Self-pay | Admitting: Obstetrics and Gynecology

## 2019-01-17 NOTE — Telephone Encounter (Signed)
Left message on voicemail to call and reschedule cancelled appointment. °

## 2019-01-23 ENCOUNTER — Other Ambulatory Visit: Payer: Self-pay | Admitting: Family Medicine

## 2019-01-23 ENCOUNTER — Telehealth: Payer: 59 | Admitting: Family Medicine

## 2019-01-23 MED FILL — ACCU-CHEK GUIDE TEST STRIP: 50 days supply | Qty: 100 | Fill #2

## 2019-01-23 MED FILL — LOSARTAN-HCTZ 100-25 MG TAB: 100-25 | 90 days supply | Qty: 90 | Fill #0

## 2019-01-23 MED FILL — metFORMIN HCL 1000 MG TABS: 1000 | 90 days supply | Qty: 180 | Fill #0

## 2019-01-23 MED FILL — PRAVASTATIN NA 40 MG TAB: 40 | 90 days supply | Qty: 90 | Fill #0

## 2019-02-01 DIAGNOSIS — M545 Low back pain: Secondary | ICD-10-CM | POA: Diagnosis not present

## 2019-02-01 DIAGNOSIS — M542 Cervicalgia: Secondary | ICD-10-CM | POA: Diagnosis not present

## 2019-02-01 DIAGNOSIS — M62830 Muscle spasm of back: Secondary | ICD-10-CM | POA: Diagnosis not present

## 2019-02-01 DIAGNOSIS — M546 Pain in thoracic spine: Secondary | ICD-10-CM | POA: Diagnosis not present

## 2019-02-20 DIAGNOSIS — M545 Low back pain: Secondary | ICD-10-CM | POA: Diagnosis not present

## 2019-02-20 DIAGNOSIS — M62830 Muscle spasm of back: Secondary | ICD-10-CM | POA: Diagnosis not present

## 2019-02-20 DIAGNOSIS — M546 Pain in thoracic spine: Secondary | ICD-10-CM | POA: Diagnosis not present

## 2019-02-20 DIAGNOSIS — M542 Cervicalgia: Secondary | ICD-10-CM | POA: Diagnosis not present

## 2019-02-27 DIAGNOSIS — M62830 Muscle spasm of back: Secondary | ICD-10-CM | POA: Diagnosis not present

## 2019-02-27 DIAGNOSIS — M546 Pain in thoracic spine: Secondary | ICD-10-CM | POA: Diagnosis not present

## 2019-02-27 DIAGNOSIS — M545 Low back pain: Secondary | ICD-10-CM | POA: Diagnosis not present

## 2019-02-27 DIAGNOSIS — M542 Cervicalgia: Secondary | ICD-10-CM | POA: Diagnosis not present

## 2019-03-13 ENCOUNTER — Ambulatory Visit: Payer: 59 | Admitting: Family Medicine

## 2019-03-13 ENCOUNTER — Other Ambulatory Visit: Payer: Self-pay

## 2019-03-13 ENCOUNTER — Encounter: Payer: Self-pay | Admitting: Family Medicine

## 2019-03-13 ENCOUNTER — Other Ambulatory Visit: Payer: Self-pay | Admitting: Family Medicine

## 2019-03-13 VITALS — BP 126/54 | HR 83 | Ht 61.0 in | Wt 168.0 lb

## 2019-03-13 DIAGNOSIS — E1169 Type 2 diabetes mellitus with other specified complication: Secondary | ICD-10-CM

## 2019-03-13 DIAGNOSIS — M542 Cervicalgia: Secondary | ICD-10-CM | POA: Diagnosis not present

## 2019-03-13 DIAGNOSIS — J3089 Other allergic rhinitis: Secondary | ICD-10-CM | POA: Diagnosis not present

## 2019-03-13 DIAGNOSIS — E785 Hyperlipidemia, unspecified: Secondary | ICD-10-CM | POA: Diagnosis not present

## 2019-03-13 DIAGNOSIS — M546 Pain in thoracic spine: Secondary | ICD-10-CM | POA: Diagnosis not present

## 2019-03-13 DIAGNOSIS — I10 Essential (primary) hypertension: Secondary | ICD-10-CM

## 2019-03-13 DIAGNOSIS — M545 Low back pain: Secondary | ICD-10-CM | POA: Diagnosis not present

## 2019-03-13 DIAGNOSIS — M62830 Muscle spasm of back: Secondary | ICD-10-CM | POA: Diagnosis not present

## 2019-03-13 LAB — POCT GLYCOSYLATED HEMOGLOBIN (HGB A1C): Hemoglobin A1C: 7.1 % — AB (ref 4.0–5.6)

## 2019-03-13 MED ORDER — FLUTICASONE PROPIONATE 50 MCG/ACT NA SUSP: 2.0000 | Freq: Every day | NASAL | 6 refills | Status: DC

## 2019-03-13 MED ORDER — FREESTYLE LITE TEST VI STRP: ORAL_STRIP | 12 refills | Status: DC

## 2019-03-13 MED ORDER — FREESTYLE LANCETS MISC: 12 refills | Status: AC

## 2019-03-13 MED ORDER — FREESTYLE LITE DEVI: 0 refills | Status: DC

## 2019-03-13 MED FILL — FREESTYLE LITE METER: 30 days supply | Qty: 1 | Fill #0

## 2019-03-13 MED FILL — FREESTYLE LITE TEST STRIP: 75 days supply | Qty: 150 | Fill #0

## 2019-03-13 MED FILL — FLUTICASONE PROP 50 MCG SPR: 50 | 30 days supply | Qty: 16 | Fill #0

## 2019-03-13 MED FILL — FREESTYLE LANCETS: 50 days supply | Qty: 100 | Fill #0

## 2019-03-13 NOTE — Progress Notes (Signed)
Established Patient Office Visit  Subjective:  Patient ID: Maria Nunez, female    DOB: April 14, 1960  Age: 59 y.o. MRN: [id number]  CC:  Chief Complaint  Patient presents with  . Diabetes    HPI [name] presents for   Hypertension- Pt denies chest pain, SOB, dizziness, or heart palpitations.  Taking meds as directed w/o problems.  Denies medication side effects.    Diabetes - no hypoglycemic events. No wounds or sores that are not healing well. No increased thirst or urination. Checking glucose at home. Taking medications as prescribed without any side effects.  Does need an updated prescription for a new meter. Admits hasn't been the best with her diet recently   Her seasonal allergies have been bothering her lately so she would also like a refill on her Flonase today.    Hyperlipidemia - tolerating stating well with no myalgias or significant side effects.  Lab Results  Component Value Date   CHOL 158 03/28/2018   CHOL 180 03/29/2017   CHOL 191 10/29/2015   Lab Results  Component Value Date   HDL 49 (L) 03/28/2018   HDL 53 03/29/2017   HDL 50 10/29/2015   Lab Results  Component Value Date   LDLCALC 84 03/28/2018   LDLCALC 97 03/29/2017   LDLCALC 116 10/29/2015   Lab Results  Component Value Date   TRIG 146 03/28/2018   TRIG 205 (H) 03/29/2017   TRIG 125 10/29/2015   Lab Results  Component Value Date   CHOLHDL 3.2 03/28/2018   CHOLHDL 3.4 03/29/2017   CHOLHDL 3.8 10/29/2015   Lab Results  Component Value Date   LDLDIRECT 125 (H) 01/02/2014     Past Medical History:  Diagnosis Date  . Abnormal ultrasound of ovary 2016   Ultrasound showing bilateral ovaries in contact with one another.  Asymptomatic.  [name] Allergy   . Diabetes mellitus    borderline  . Fibroid   . Heart murmur   . Hidradenitis   . High cholesterol   . Hypertension     Past Surgical History:  Procedure Laterality Date  . ABDOMINAL HYSTERECTOMY     supracervical   . BREAST EXCISIONAL BIOPSY Right   . BREAST SURGERY     cysts removed right breast  . CARPAL TUNNEL RELEASE Right 01/08/2017   Procedure: CARPAL TUNNEL RELEASE, POSSIBLE RIGHT THUMB CARPOMETACARPAL INJECTION;  Surgeon: Roseanne Kaufman, MD;  Location: [facility];  Service: Orthopedics;  Laterality: Right;  60 MINS  . CESAREAN SECTION    . INGUINAL HIDRADENITIS EXCISION    . LEEP  05/04/13   HPV change    Family History  Problem Relation Age of Onset  . Asthma Mother   . COPD Mother   . Diabetes Mother   . Hypertension Mother   . Diabetes Father   . Hypertension Father   . Breast cancer Maternal Aunt   . Breast cancer Paternal Aunt     Social History   Socioeconomic History  . Marital status: Divorced    Spouse name: Not on file  . Number of children: Not on file  . Years of education: Not on file  . Highest education level: Not on file  Occupational History  . Not on file  Tobacco Use  . Smoking status: Never Smoker  . Smokeless tobacco: Never Used  Substance and Sexual Activity  . Alcohol use: No    Alcohol/week: 0.0 standard drinks  . Drug use: No  .  Sexual activity: Not on file    Comment: TAH/supracervical  Other Topics Concern  . Not on file  Social History Narrative  . Not on file   Social Determinants of Health   Financial Resource Strain:   . Difficulty of Paying Living Expenses: Not on file  Food Insecurity:   . Worried About Charity fundraiser in the Last Year: Not on file  . Ran Out of Food in the Last Year: Not on file  Transportation Needs:   . Lack of Transportation (Medical): Not on file  . Lack of Transportation (Non-Medical): Not on file  Physical Activity:   . Days of Exercise per Week: Not on file  . Minutes of Exercise per Session: Not on file  Stress:   . Feeling of Stress : Not on file  Social Connections:   . Frequency of Communication with Friends and Family: Not on file  . Frequency of Social Gatherings with  Friends and Family: Not on file  . Attends Religious Services: Not on file  . Active Member of Clubs or Organizations: Not on file  . Attends Archivist Meetings: Not on file  . Marital Status: Not on file  Intimate Partner Violence:   . Fear of Current or Ex-Partner: Not on file  . Emotionally Abused: Not on file  . Physically Abused: Not on file  . Sexually Abused: Not on file    Outpatient Medications Prior to Visit  Medication Sig Dispense Refill  . Ascorbic Acid (VITAMIN C) 1000 MG tablet Take 1 tablet by mouth daily.    [name] aspirin 81 MG tablet Take 81 mg by mouth daily.      . celecoxib (CELEBREX) 200 MG capsule One to 2 tablets by mouth daily as needed for pain. 60 capsule 2  . Cyanocobalamin (B-12) 500 MCG TABS Take 1 tablet by mouth daily.    [name] FOLIC ACID PO Take Q000111Q mcg by mouth daily.     [name] losartan-hydrochlorothiazide (HYZAAR) 100-25 MG tablet TAKE 1 TABLET BY MOUTH DAILY. 90 tablet 0  . metFORMIN (GLUCOPHAGE) 1000 MG tablet TAKE 1 TABLET BY MOUTH 2 TIMES DAILY WITH A MEAL. 180 tablet 0  . Multiple Vitamin (MULTIVITAMIN) tablet Take 1 tablet by mouth daily.      . Omega-3 Fatty Acids (FISH OIL) 1000 MG CAPS Take 1,000 mg by mouth daily.     . pravastatin (PRAVACHOL) 40 MG tablet TAKE 1 TABLET BY MOUTH ONCE DAILY 90 tablet 1  . pyridOXINE (VITAMIN B-6) 100 MG tablet Take 100 mg by mouth daily.    . Zinc Sulfate (ZINC 15 PO) Take by mouth.      [name] ACCU-CHEK FASTCLIX LANCETS MISC For use when checking blood sugars up to four times daily. Dx: E11.9 4 each 11  . ACCU-CHEK GUIDE test strip USE TO TEST BLOOD SUGAR TWICE DAILY 100 each 9  . Blood Glucose Monitoring Suppl (FREESTYLE LITE) DEVI   0   No facility-administered medications prior to visit.    Allergies  Allergen Reactions  . Sulfonamide Derivatives Hives    REACTION: hives  . Xigduo Xr [Dapagliflozin-Metformin Hcl Er] Hives  . Hycodan [Hydrocodone-Homatropine] Itching  . Cefaclor Rash  . Cephalexin Rash   . Januvia [Sitagliptin] Nausea Only    Back pain  . Levofloxacin Itching and Rash  . Penicillins Other (See Comments)    unknown  . Tetracycline Nausea Only    GI upset  . Victoza [Liraglutide] Other (See Comments)  Back pain    ROS Review of Systems    Objective:    Physical Exam  BP (!) 126/54   Pulse 83   Ht 5\' 1"  (1.549 m)   Wt 168 lb (76.2 kg)   LMP 01/27/2000 (Approximate)   SpO2 97%   BMI 31.74 kg/m  Wt Readings from Last 3 Encounters:  03/13/19 168 lb (76.2 kg)  01/13/19 168 lb (76.2 kg)  11/08/18 164 lb (74.4 kg)     Health Maintenance Due  Topic Date Due  . OPHTHALMOLOGY EXAM  05/04/2018  . FOOT EXAM  06/30/2018    There are no preventive care reminders to display for this patient.  Lab Results  Component Value Date   TSH 0.96 11/25/2017   Lab Results  Component Value Date   WBC 10.2 01/13/2019   HGB 14.1 01/13/2019   HCT 41.3 01/13/2019   MCV 87.7 01/13/2019   PLT 300 01/13/2019   Lab Results  Component Value Date   NA 139 01/13/2019   K 3.7 01/13/2019   CO2 29 01/13/2019   GLUCOSE 113 (H) 01/13/2019   BUN 21 (H) 01/13/2019   CREATININE 0.69 01/13/2019   BILITOT 0.4 03/28/2018   ALKPHOS 76 10/29/2015   AST 27 03/28/2018   ALT 41 (H) 03/28/2018   PROT 6.8 03/28/2018   ALBUMIN 4.4 10/29/2015   CALCIUM 9.6 01/13/2019   ANIONGAP 13 01/13/2019   Lab Results  Component Value Date   CHOL 158 03/28/2018   Lab Results  Component Value Date   HDL 49 (L) 03/28/2018   Lab Results  Component Value Date   LDLCALC 84 03/28/2018   Lab Results  Component Value Date   TRIG 146 03/28/2018   Lab Results  Component Value Date   CHOLHDL 3.2 03/28/2018   Lab Results  Component Value Date   HGBA1C 7.1 (A) 03/13/2019      Assessment & Plan:   Problem List Items Addressed This Visit      Cardiovascular and Mediastinum   HYPERTENSION, BENIGN SYSTEMIC - Primary    Well controlled. Continue current regimen. Follow up in  4  mo       Relevant Orders   COMPLETE METABOLIC PANEL WITH GFR   Lipid panel   POCT glycosylated hemoglobin (Hb A1C) (Completed)     Endocrine   Type 2 diabetes mellitus with other specified complication (HCC)    123456 up and uncontrolled today.  Discussed getting back on track with diet.  She is actually doing a great job with exercise right now.  She is getting on the treadmill for 30 minutes a day and sometimes will even do an extra 10 minutes.  Aloe up in 3 months.      Relevant Medications   Blood Glucose Monitoring Suppl (FREESTYLE LITE) DEVI   glucose blood (FREESTYLE LITE) test strip   Lancets (FREESTYLE) lancets   Other Relevant Orders   COMPLETE METABOLIC PANEL WITH GFR   Lipid panel   POCT glycosylated hemoglobin (Hb A1C) (Completed)   Hyperlipidemia associated with type 2 diabetes mellitus ([place])    Due to recheck lipids.      Relevant Orders   COMPLETE METABOLIC PANEL WITH GFR   Lipid panel   POCT glycosylated hemoglobin (Hb A1C) (Completed)    Other Visit Diagnoses    Allergic rhinitis due to other allergic trigger, unspecified seasonality       Relevant Medications   fluticasone (FLONASE) 50 MCG/ACT nasal spray  Meds ordered this encounter  Medications  . Blood Glucose Monitoring Suppl (FREESTYLE LITE) DEVI    Sig: For testing blood sugars daily. DX:E11.69    Dispense:  1 each    Refill:  0  . glucose blood (FREESTYLE LITE) test strip    Sig: For use when testing blood sugars twice daily. DX:E11.69    Dispense:  400 each    Refill:  12  . Lancets (FREESTYLE) lancets    Sig: For use when testing blood sugars daily. DX:E11.69    Dispense:  200 each    Refill:  12  . fluticasone (FLONASE) 50 MCG/ACT nasal spray    Sig: Place 2 sprays into both nostrils daily.    Dispense:  16 g    Refill:  6    Follow-up: Return in about 3 months (around 06/10/2019) for Diabetes follow-up.    Beatrice Lecher, MD

## 2019-03-13 NOTE — Assessment & Plan Note (Signed)
Due to recheck lipids. 

## 2019-03-13 NOTE — Assessment & Plan Note (Signed)
Well controlled. Continue current regimen. Follow up in  4 mo 

## 2019-03-13 NOTE — Assessment & Plan Note (Signed)
A1c up and uncontrolled today.  Discussed getting back on track with diet.  She is actually doing a great job with exercise right now.  She is getting on the treadmill for 30 minutes a day and sometimes will even do an extra 10 minutes.  Aloe up in 3 months.

## 2019-04-06 ENCOUNTER — Encounter: Payer: Self-pay | Admitting: Family Medicine

## 2019-04-10 DIAGNOSIS — M545 Low back pain: Secondary | ICD-10-CM | POA: Diagnosis not present

## 2019-04-10 DIAGNOSIS — M542 Cervicalgia: Secondary | ICD-10-CM | POA: Diagnosis not present

## 2019-04-10 DIAGNOSIS — M546 Pain in thoracic spine: Secondary | ICD-10-CM | POA: Diagnosis not present

## 2019-04-10 DIAGNOSIS — M62830 Muscle spasm of back: Secondary | ICD-10-CM | POA: Diagnosis not present

## 2019-04-13 ENCOUNTER — Ambulatory Visit: Payer: 59 | Admitting: Sports Medicine

## 2019-04-13 ENCOUNTER — Other Ambulatory Visit: Payer: Self-pay

## 2019-04-13 ENCOUNTER — Ambulatory Visit: Payer: 59 | Admitting: Physician Assistant

## 2019-04-13 DIAGNOSIS — H0014 Chalazion left upper eyelid: Secondary | ICD-10-CM | POA: Diagnosis not present

## 2019-04-13 DIAGNOSIS — M1811 Unilateral primary osteoarthritis of first carpometacarpal joint, right hand: Secondary | ICD-10-CM

## 2019-04-13 MED ORDER — CELECOXIB 200 MG PO CAPS: ORAL_CAPSULE | ORAL | 2 refills | Status: DC

## 2019-04-13 MED ORDER — ERYTHROMYCIN 5 MG/GM OP OINT: 1.0000 "application " | TOPICAL_OINTMENT | Freq: Three times a day (TID) | OPHTHALMIC | 0 refills | Status: AC

## 2019-04-13 NOTE — Patient Instructions (Signed)
Chalazion  A chalazion is a swelling or lump on the eyelid. It can affect the upper eyelid or the lower eyelid. What are the causes? This condition may be caused by:  Long-lasting (chronic) inflammation of the eyelid glands.  A blocked oil gland in the eyelid. What are the signs or symptoms? Symptoms of this condition include:  Swelling of the eyelid. The swelling may spread to areas around the eye.  A hard lump on the eyelid.  Blurry vision. The lump on the eyelid may make it hard to see out of the eye. How is this diagnosed? This condition is diagnosed with an examination of the eye. How is this treated? This condition is treated by applying a warm compress to the eyelid. If the condition does not improve, it may be treated with:  Medicine that is injected into the chalazion by a health care provider.  Surgery.  Medicine that is applied to the eye. Follow these instructions at home: Managing pain and swelling  Apply a warm, moist compress to the eyelid 4-6 times a day for 10-15 minutes at a time. This will help to open any blocked glands and to reduce redness and swelling.  Apply over-the-counter and prescription medicines only as told by your health care provider. General instructions  Do not touch the chalazion.  Do not try to remove the pus. Do not squeeze the chalazion or stick it with a pin or needle.  Do not rub your eyes.  Wash your hands often. Dry your hands with a clean towel.  Keep your face, scalp, and eyebrows clean.  Avoid wearing eye makeup.  If the chalazion does not break open (rupture) on its own, return to your health care provider.  Keep all follow-up appointments as told by your health care provider. This is important. Contact a health care provider if:  Your eyelid has not improved in 4 weeks.  Your eyelid is getting worse.  You have a fever.  The chalazion does not rupture on its own after a month of home treatment. Get help right  away if:  You have pain in your eye.  Your vision changes.  The chalazion becomes painful or red.  The chalazion gets bigger. Summary  A chalazion is a swelling or lump on the upper or lower eyelid.  It may be caused by chronic inflammation or a blocked oil gland.  Apply a warm, moist compress to the eyelid 4-6 times a day for 10-15 minutes at a time.  Keep your face, scalp, and eyebrows clean. This information is not intended to replace advice given to you by your health care provider. Make sure you discuss any questions you have with your health care provider. Document Revised: 07/01/2017 Document Reviewed: 07/01/2017 Elsevier Patient Education  2020 [employer]  

## 2019-04-13 NOTE — Assessment & Plan Note (Signed)
Right first CMC injection today. Last injection was in October, she only got a couple of months of relief. I have again recommended CMC arthroplasty, she remains resistant.[name]

## 2019-04-13 NOTE — Assessment & Plan Note (Signed)
Lateral canthus. Adding topical erythromycin, and if no improvement over a couple of weeks we will refer to ophthalmology.

## 2019-04-13 NOTE — Progress Notes (Signed)
    Procedures performed today:    Procedure: Real-time Ultrasound Guided injection of the right first [facility] Device: Samsung HS60  Verbal informed consent obtained.  Time-out conducted.  Noted no overlying erythema, induration, or other signs of local infection.  Skin prepped in a sterile fashion.  Local anesthesia: Topical Ethyl chloride.  With sterile technique and under real time ultrasound guidance:  Noted significant degeneration of the [facility], very difficult to find the joint line, I injected 1/2 cc Kenalog 40, 1/2 cc lidocaine.   Completed without difficulty  Pain immediately resolved suggesting accurate placement of the medication.  Advised to call if fevers/chills, erythema, induration, drainage, or persistent bleeding.  Images permanently stored and available for review in the ultrasound unit.  Impression: Technically successful ultrasound guided injection.  Independent interpretation of notes and tests performed by another provider:   None.  Impression and Recommendations:    Localized primary osteoarthritis of carpometacarpal joint of right thumb Right first CMC injection today. Last injection was in October, she only got a couple of months of relief. I have again recommended CMC arthroplasty, she remains resistant..  Chalazion left upper eyelid Lateral canthus. Adding topical erythromycin, and if no improvement over a couple of weeks we will refer to ophthalmology.    ___________________________________________ Gwen Her. Dianah Field, M.D., ABFM., CAQSM. Primary Care and [place] Instructor of [place] [facility] of Medicine

## 2019-04-26 ENCOUNTER — Encounter: Payer: Self-pay | Admitting: Sports Medicine

## 2019-04-26 ENCOUNTER — Ambulatory Visit: Payer: 59 | Admitting: Sports Medicine

## 2019-04-26 ENCOUNTER — Other Ambulatory Visit: Payer: Self-pay

## 2019-04-26 DIAGNOSIS — H0014 Chalazion left upper eyelid: Secondary | ICD-10-CM

## 2019-04-26 NOTE — Assessment & Plan Note (Signed)
This pleasant 59 year old female returns, she had a lateral canthal lower lid chalazion at the last visit. We treated her with topical erythromycin and by day 2 of treatment her symptoms were completely gone. I can still feel the chalazion cyst, but it is no longer symptomatic. She can return as needed for this.

## 2019-04-26 NOTE — Progress Notes (Signed)
    Procedures performed today:    None.  Independent interpretation of notes and tests performed by another provider:   None.  Brief History, Exam, Impression, and Recommendations:    Chalazion left upper eyelid This pleasant 59 year old female returns, she had a lateral canthal lower lid chalazion at the last visit. We treated her with topical erythromycin and by day 2 of treatment her symptoms were completely gone. I can still feel the chalazion cyst, but it is no longer symptomatic. She can return as needed for this.    ___________________________________________ Gwen Her. Dianah Field, M.D., ABFM., CAQSM. Primary Care and [place] Instructor of [place] [facility] Medicine

## 2019-04-27 ENCOUNTER — Other Ambulatory Visit: Payer: Self-pay | Admitting: Family Medicine

## 2019-04-27 MED FILL — LOSARTAN-HCTZ 100-25 MG TAB: 100-25 | 90 days supply | Qty: 90 | Fill #0

## 2019-04-27 MED FILL — PRAVASTATIN NA 40 MG TAB: 40 | 90 days supply | Qty: 90 | Fill #1

## 2019-04-27 MED FILL — metFORMIN HCL 1000 MG TABS: 1000 | 90 days supply | Qty: 180 | Fill #0

## 2019-05-11 DIAGNOSIS — M546 Pain in thoracic spine: Secondary | ICD-10-CM | POA: Diagnosis not present

## 2019-05-11 DIAGNOSIS — M542 Cervicalgia: Secondary | ICD-10-CM | POA: Diagnosis not present

## 2019-05-11 DIAGNOSIS — M545 Low back pain: Secondary | ICD-10-CM | POA: Diagnosis not present

## 2019-05-11 DIAGNOSIS — M62830 Muscle spasm of back: Secondary | ICD-10-CM | POA: Diagnosis not present

## 2019-05-23 DIAGNOSIS — M546 Pain in thoracic spine: Secondary | ICD-10-CM | POA: Diagnosis not present

## 2019-05-23 DIAGNOSIS — M62830 Muscle spasm of back: Secondary | ICD-10-CM | POA: Diagnosis not present

## 2019-05-23 DIAGNOSIS — M545 Low back pain: Secondary | ICD-10-CM | POA: Diagnosis not present

## 2019-05-23 DIAGNOSIS — M542 Cervicalgia: Secondary | ICD-10-CM | POA: Diagnosis not present

## 2019-06-01 DIAGNOSIS — M545 Low back pain: Secondary | ICD-10-CM | POA: Diagnosis not present

## 2019-06-01 DIAGNOSIS — M546 Pain in thoracic spine: Secondary | ICD-10-CM | POA: Diagnosis not present

## 2019-06-01 DIAGNOSIS — M542 Cervicalgia: Secondary | ICD-10-CM | POA: Diagnosis not present

## 2019-06-01 DIAGNOSIS — M62830 Muscle spasm of back: Secondary | ICD-10-CM | POA: Diagnosis not present

## 2019-06-08 ENCOUNTER — Ambulatory Visit: Payer: 59 | Admitting: Family Medicine

## 2019-06-21 ENCOUNTER — Other Ambulatory Visit: Payer: Self-pay | Admitting: *Deleted

## 2019-06-21 DIAGNOSIS — M1811 Unilateral primary osteoarthritis of first carpometacarpal joint, right hand: Secondary | ICD-10-CM

## 2019-06-21 MED ORDER — CELECOXIB 200 MG PO CAPS: ORAL_CAPSULE | ORAL | 2 refills | Status: DC

## 2019-06-21 MED FILL — CELECOXIB 200 MG CAP: 200 | 30 days supply | Qty: 60 | Fill #0

## 2019-06-22 DIAGNOSIS — H04123 Dry eye syndrome of bilateral lacrimal glands: Secondary | ICD-10-CM | POA: Diagnosis not present

## 2019-06-22 DIAGNOSIS — E119 Type 2 diabetes mellitus without complications: Secondary | ICD-10-CM | POA: Diagnosis not present

## 2019-06-22 DIAGNOSIS — H5213 Myopia, bilateral: Secondary | ICD-10-CM | POA: Diagnosis not present

## 2019-06-27 DIAGNOSIS — M546 Pain in thoracic spine: Secondary | ICD-10-CM | POA: Diagnosis not present

## 2019-06-27 DIAGNOSIS — M545 Low back pain: Secondary | ICD-10-CM | POA: Diagnosis not present

## 2019-06-27 DIAGNOSIS — M62830 Muscle spasm of back: Secondary | ICD-10-CM | POA: Diagnosis not present

## 2019-06-27 DIAGNOSIS — M542 Cervicalgia: Secondary | ICD-10-CM | POA: Diagnosis not present

## 2019-06-29 ENCOUNTER — Ambulatory Visit: Payer: 59 | Admitting: Family Medicine

## 2019-07-07 ENCOUNTER — Ambulatory Visit (INDEPENDENT_AMBULATORY_CARE_PROVIDER_SITE_OTHER): Payer: 59 | Admitting: Family Medicine

## 2019-07-07 ENCOUNTER — Encounter: Payer: Self-pay | Admitting: Family Medicine

## 2019-07-07 VITALS — BP 165/67 | HR 89 | Ht 61.0 in | Wt 170.0 lb

## 2019-07-07 DIAGNOSIS — E1169 Type 2 diabetes mellitus with other specified complication: Secondary | ICD-10-CM | POA: Diagnosis not present

## 2019-07-07 DIAGNOSIS — N644 Mastodynia: Secondary | ICD-10-CM | POA: Diagnosis not present

## 2019-07-07 DIAGNOSIS — I10 Essential (primary) hypertension: Secondary | ICD-10-CM

## 2019-07-07 DIAGNOSIS — Z0000 Encounter for general adult medical examination without abnormal findings: Secondary | ICD-10-CM

## 2019-07-07 NOTE — Progress Notes (Signed)
Subjective:     Maria Nunez is a 59 y.o. female and is here for a comprehensive physical exam. The patient reports no problems. She has been walking daily for exercise.    Social History   Socioeconomic History  . Marital status: Divorced    Spouse name: Not on file  . Number of children: Not on file  . Years of education: Not on file  . Highest education level: Not on file  Occupational History  . Not on file  Tobacco Use  . Smoking status: Never Smoker  . Smokeless tobacco: Never Used  Vaping Use  . Vaping Use: Never used  Substance and Sexual Activity  . Alcohol use: No    Alcohol/week: 0.0 standard drinks  . Drug use: No  . Sexual activity: Not on file    Comment: TAH/supracervical  Other Topics Concern  . Not on file  Social History Narrative  . Not on file   Social Determinants of Health   Financial Resource Strain:   . Difficulty of Paying Living Expenses:   Food Insecurity:   . Worried About Charity fundraiser in the Last Year:   . Arboriculturist in the Last Year:   Transportation Needs:   . Film/video editor (Medical):   [name] Lack of Transportation (Non-Medical):   Physical Activity:   . Days of Exercise per Week:   . Minutes of Exercise per Session:   Stress:   . Feeling of Stress :   Social Connections:   . Frequency of Communication with Friends and Family:   . Frequency of Social Gatherings with Friends and Family:   . Attends Religious Services:   . Active Member of Clubs or Organizations:   . Attends Archivist Meetings:   [name] Marital Status:   Intimate Partner Violence:   . Fear of Current or Ex-Partner:   . Emotionally Abused:   [name] Physically Abused:   . Sexually Abused:    Health Maintenance  Topic Date Due  . INFLUENZA VACCINE  08/27/2019  . HEMOGLOBIN A1C  09/10/2019  . OPHTHALMOLOGY EXAM  05/01/2020  . FOOT EXAM  07/06/2020  . MAMMOGRAM  09/07/2020  . PAP SMEAR-Modifier  07/10/2021  . COLONOSCOPY  07/25/2023  .  TETANUS/TDAP  04/02/2024  . PNEUMOCOCCAL POLYSACCHARIDE VACCINE AGE 95-64 HIGH RISK  Completed  . COVID-19 Vaccine  Completed  . Hepatitis C Screening  Completed  . HIV Screening  Completed    The following portions of the patient's history were reviewed and updated as appropriate: allergies, current medications, past family history, past medical history, past social history, past surgical history and problem list.  Review of Systems A comprehensive review of systems was negative.   Objective:    BP (!) 165/67   Pulse 89   Ht 5\' 1"  (1.549 m)   Wt 170 lb (77.1 kg)   LMP 01/27/2000 (Approximate)   BMI 32.12 kg/m  General appearance: alert, cooperative and appears stated age Head: Normocephalic, without obvious abnormality, atraumatic Eyes: conj clear, EOMI, PEERLA Ears: normal TM's and external ear canals both ears Nose: Nares normal. Septum midline. Mucosa normal. No drainage or sinus tenderness. Throat: lips, mucosa, and tongue normal; teeth and gums normal Neck: no adenopathy, no carotid bruit, no JVD, supple, symmetrical, trachea midline and thyroid not enlarged, symmetric, no tenderness/mass/nodules Back: symmetric, no curvature. ROM normal. No CVA tenderness. Lungs: clear to auscultation bilaterally Breasts: normal appearance, no masses or tenderness. incison on right  breast has healed well. No palpable mass.  Heart: regular rate and rhythm, S1, S2 normal, no murmur, click, rub or gallop Abdomen: soft, non-tender; bowel sounds normal; no masses,  no organomegaly Extremities: extremities normal, atraumatic, no cyanosis or edema Pulses: 2+ and symmetric Skin: Skin color, texture, turgor normal. No rashes or lesions Lymph nodes: Cervical, supraclavicular, and axillary nodes normal. Neurologic: Alert and oriented X 3, normal strength and tone. Normal symmetric reflexes. Normal coordination and gait    Assessment:    Healthy female exam.      Plan:     See After Visit  Summary for Counseling Recommendations   Keep up a regular exercise program and make sure you are eating a healthy diet Try to eat 4 servings of dairy a day, or if you are lactose intolerant take a calcium with vitamin D daily.  Your vaccines are up to date.  Labs ordered.  Right breast pain-she had a recall last year for a breast lesion.  She discussed with him having the flu because is what she preferred but they recommended not doing it definitely could cause more scar tissue but she has been having some pain where the clip was placed she says it feels like a lot of buildup of pressure especially when she is active.  She feels like she wears good supportive bra wear.  We discussed that we can always get a diagnostic mammogram sooner rather than waiting for her screening mammogram in August.  HTN - not well controlled.  Did not take her medications today.  Urged her to check BP at home and send [place] the readings when she is taking her medication.

## 2019-07-07 NOTE — Patient Instructions (Signed)
Health Maintenance, Female Adopting a healthy lifestyle and getting preventive care are important in promoting health and wellness. Ask your health care provider about:  The right schedule for you to have regular tests and exams.  Things you can do on your own to prevent diseases and keep yourself healthy. What should I know about diet, weight, and exercise? Eat a healthy diet   Eat a diet that includes plenty of vegetables, fruits, low-fat dairy products, and lean protein.  Do not eat a lot of foods that are high in solid fats, added sugars, or sodium. Maintain a healthy weight Body mass index (BMI) is used to identify weight problems. It estimates body fat based on height and weight. Your health care provider can help determine your BMI and help you achieve or maintain a healthy weight. Get regular exercise Get regular exercise. This is one of the most important things you can do for your health. Most adults should:  Exercise for at least 150 minutes each week. The exercise should increase your heart rate and make you sweat (moderate-intensity exercise).  Do strengthening exercises at least twice a week. This is in addition to the moderate-intensity exercise.  Spend less time sitting. Even light physical activity can be beneficial. Watch cholesterol and blood lipids Have your blood tested for lipids and cholesterol at 59 years of age, then have this test every 5 years. Have your cholesterol levels checked more often if:  Your lipid or cholesterol levels are high.  You are older than 59 years of age.  You are at high risk for heart disease. What should I know about cancer screening? Depending on your health history and family history, you may need to have cancer screening at various ages. This may include screening for:  Breast cancer.  Cervical cancer.  Colorectal cancer.  Skin cancer.  Lung cancer. What should I know about heart disease, diabetes, and high blood  pressure? Blood pressure and heart disease  High blood pressure causes heart disease and increases the risk of stroke. This is more likely to develop in people who have high blood pressure readings, are of African descent, or are overweight.  Have your blood pressure checked: ? Every 3-5 years if you are 18-39 years of age. ? Every year if you are 40 years old or older. Diabetes Have regular diabetes screenings. This checks your fasting blood sugar level. Have the screening done:  Once every three years after age 40 if you are at a normal weight and have a low risk for diabetes.  More often and at a younger age if you are overweight or have a high risk for diabetes. What should I know about preventing infection? Hepatitis B If you have a higher risk for hepatitis B, you should be screened for this virus. Talk with your health care provider to find out if you are at risk for hepatitis B infection. Hepatitis C Testing is recommended for:  Everyone born from 1945 through 1965.  Anyone with known risk factors for hepatitis C. Sexually transmitted infections (STIs)  Get screened for STIs, including gonorrhea and chlamydia, if: ? You are sexually active and are younger than 59 years of age. ? You are older than 59 years of age and your health care provider tells you that you are at risk for this type of infection. ? Your sexual activity has changed since you were last screened, and you are at increased risk for chlamydia or gonorrhea. Ask your health care provider if   you are at risk.  Ask your health care provider about whether you are at high risk for HIV. Your health care provider may recommend a prescription medicine to help prevent HIV infection. If you choose to take medicine to prevent HIV, you should first get tested for HIV. You should then be tested every 3 months for as long as you are taking the medicine. Pregnancy  If you are about to stop having your period (premenopausal) and  you may become pregnant, seek counseling before you get pregnant.  Take 400 to 800 micrograms (mcg) of folic acid every day if you become pregnant.  Ask for birth control (contraception) if you want to prevent pregnancy. Osteoporosis and menopause Osteoporosis is a disease in which the bones lose minerals and strength with aging. This can result in bone fractures. If you are 65 years old or older, or if you are at risk for osteoporosis and fractures, ask your health care provider if you should:  Be screened for bone loss.  Take a calcium or vitamin D supplement to lower your risk of fractures.  Be given hormone replacement therapy (HRT) to treat symptoms of menopause. Follow these instructions at home: Lifestyle  Do not use any products that contain nicotine or tobacco, such as cigarettes, e-cigarettes, and chewing tobacco. If you need help quitting, ask your health care provider.  Do not use street drugs.  Do not share needles.  Ask your health care provider for help if you need support or information about quitting drugs. Alcohol use  Do not drink alcohol if: ? Your health care provider tells you not to drink. ? You are pregnant, may be pregnant, or are planning to become pregnant.  If you drink alcohol: ? Limit how much you use to 0-1 drink a day. ? Limit intake if you are breastfeeding.  Be aware of how much alcohol is in your drink. In the [place] one drink equals one 12 oz bottle of beer (355 mL), one 5 oz glass of wine (148 mL), or one 1 oz glass of hard liquor (44 mL). General instructions  Schedule regular health, dental, and eye exams.  Stay current with your vaccines.  Tell your health care provider if: ? You often feel depressed. ? You have ever been abused or do not feel safe at home. Summary  Adopting a healthy lifestyle and getting preventive care are important in promoting health and wellness.  Follow your health care provider's instructions about healthy  diet, exercising, and getting tested or screened for diseases.  Follow your health care provider's instructions on monitoring your cholesterol and blood pressure. This information is not intended to replace advice given to you by your health care provider. Make sure you discuss any questions you have with your health care provider. Document Revised: 01/05/2018 Document Reviewed: 01/05/2018 Elsevier Patient Education  2020 Elsevier Inc.  

## 2019-07-11 ENCOUNTER — Encounter: Payer: Self-pay | Admitting: Family Medicine

## 2019-07-12 ENCOUNTER — Ambulatory Visit: Payer: Self-pay | Admitting: Obstetrics and Gynecology

## 2019-07-13 LAB — HM DIABETES EYE EXAM

## 2019-07-17 ENCOUNTER — Other Ambulatory Visit: Payer: Self-pay

## 2019-07-18 ENCOUNTER — Telehealth: Payer: Self-pay | Admitting: Obstetrics and Gynecology

## 2019-07-18 ENCOUNTER — Ambulatory Visit: Payer: 59 | Admitting: Obstetrics and Gynecology

## 2019-07-18 ENCOUNTER — Encounter: Payer: Self-pay | Admitting: Obstetrics and Gynecology

## 2019-07-18 ENCOUNTER — Ambulatory Visit: Payer: 59 | Admitting: Family Medicine

## 2019-07-18 VITALS — BP 138/66 | HR 84 | Temp 97.1°F | Resp 18 | Ht 60.0 in | Wt 170.0 lb

## 2019-07-18 DIAGNOSIS — N951 Menopausal and female climacteric states: Secondary | ICD-10-CM

## 2019-07-18 DIAGNOSIS — Z01419 Encounter for gynecological examination (general) (routine) without abnormal findings: Secondary | ICD-10-CM

## 2019-07-18 DIAGNOSIS — N644 Mastodynia: Secondary | ICD-10-CM

## 2019-07-18 NOTE — Patient Instructions (Signed)
EXERCISE AND DIET:  We recommended that you start or continue a regular exercise program for good health. Regular exercise means any activity that makes your heart beat faster and makes you sweat.  We recommend exercising at least 30 minutes per day at least 3 days a week, preferably 4 or 5.  We also recommend a diet low in fat and sugar.  Inactivity, poor dietary choices and obesity can cause diabetes, heart attack, stroke, and kidney damage, among others.    ALCOHOL AND SMOKING:  Women should limit their alcohol intake to no more than 7 drinks/beers/glasses of wine (combined, not each!) per week. Moderation of alcohol intake to this level decreases your risk of breast cancer and liver damage. And of course, no recreational drugs are part of a healthy lifestyle.  And absolutely no smoking or even second hand smoke. Most people know smoking can cause heart and lung diseases, but did you know it also contributes to weakening of your bones? Aging of your skin?  Yellowing of your teeth and nails?  CALCIUM AND VITAMIN D:  Adequate intake of calcium and Vitamin D are recommended.  The recommendations for exact amounts of these supplements seem to change often, but generally speaking 600 mg of calcium (either carbonate or citrate) and 800 units of Vitamin D per day seems prudent. Certain women may benefit from higher intake of Vitamin D.  If you are among these women, your doctor will have told you during your visit.    PAP SMEARS:  Pap smears, to check for cervical cancer or precancers,  have traditionally been done yearly, although recent scientific advances have shown that most women can have pap smears less often.  However, every woman still should have a physical exam from her gynecologist every year. It will include a breast check, inspection of the vulva and vagina to check for abnormal growths or skin changes, a visual exam of the cervix, and then an exam to evaluate the size and shape of the uterus and  ovaries.  And after 59 years of age, a rectal exam is indicated to check for rectal cancers. We will also provide age appropriate advice regarding health maintenance, like when you should have certain vaccines, screening for sexually transmitted diseases, bone density testing, colonoscopy, mammograms, etc.   MAMMOGRAMS:  All women over 40 years old should have a yearly mammogram. Many facilities now offer a "3D" mammogram, which may cost around $50 extra out of pocket. If possible,  we recommend you accept the option to have the 3D mammogram performed.  It both reduces the number of women who will be called back for extra views which then turn out to be normal, and it is better than the routine mammogram at detecting truly abnormal areas.    COLONOSCOPY:  Colonoscopy to screen for colon cancer is recommended for all women at age 50.  We know, you hate the idea of the prep.  We agree, BUT, having colon cancer and not knowing it is worse!!  Colon cancer so often starts as a polyp that can be seen and removed at colonscopy, which can quite literally save your life!  And if your first colonoscopy is normal and you have no family history of colon cancer, most women don't have to have it again for 10 years.  Once every ten years, you can do something that may end up saving your life, right?  We will be happy to help you get it scheduled when you are ready.    Be sure to check your insurance coverage so you understand how much it will cost.  It may be covered as a preventative service at no cost, but you should check your particular policy.      Atrophic Vaginitis  Atrophic vaginitis is a condition in which the tissues that line the vagina become dry and thin. This condition is most common in women who have stopped having regular menstrual periods (are in menopause). This usually starts when a woman is 45-55 years old. That is the time when a woman's estrogen levels begin to drop (decrease). Estrogen is a female  hormone. It helps to keep the tissues of the vagina moist. It stimulates the vagina to produce a clear fluid that lubricates the vagina for sexual intercourse. This fluid also protects the vagina from infection. Lack of estrogen can cause the lining of the vagina to get thinner and dryer. The vagina may also shrink in size. It may become less elastic. Atrophic vaginitis tends to get worse over time as a woman's estrogen level drops. What are the causes? This condition is caused by the normal drop in estrogen that happens around the time of menopause. What increases the risk? Certain conditions or situations may lower a woman's estrogen level, leading to a higher risk for atrophic vaginitis. You are more likely to develop this condition if:  You are taking medicines that block estrogen.  You have had your ovaries removed.  You are being treated for cancer with X-ray (radiation) or medicines (chemotherapy).  You have given birth or are breastfeeding.  You are older than age 50.  You smoke. What are the signs or symptoms? Symptoms of this condition include:  Pain, soreness, or bleeding during sexual intercourse (dyspareunia).  Vaginal burning, irritation, or itching.  Pain or bleeding when a speculum is used in a vaginal exam (pelvic exam).  Having burning pain when passing urine.  Vaginal discharge that is brown or yellow. In some cases, there are no symptoms. How is this diagnosed? This condition is diagnosed by taking a medical history and doing a physical exam. This will include a pelvic exam that checks the vaginal tissues. Though rare, you may also have other tests, including:  A urine test.  A test that checks the acid balance in your vagina (acid balance test). How is this treated? Treatment for this condition depends on how severe your symptoms are. Treatment may include:  Using an over-the-counter vaginal lubricant before sex.  Using a long-acting vaginal  moisturizer.  Using low-dose vaginal estrogen for moderate to severe symptoms that do not respond to other treatments. Options include creams, tablets, and inserts (vaginal rings). Before you use a vaginal estrogen, tell your health care provider if you have a history of: ? Breast cancer. ? Endometrial cancer. ? Blood clots. If you are not sexually active and your symptoms are very mild, you may not need treatment. Follow these instructions at home: Medicines  Take over-the-counter and prescription medicines only as told by your health care provider. Do not use herbal or alternative medicines unless your health care provider says that you can.  Use over-the-counter creams, lubricants, or moisturizers for dryness only as directed by your health care provider. General instructions  If your atrophic vaginitis is caused by menopause, discuss all of your menopause symptoms and treatment options with your health care provider.  Do not douche.  Do not use products that can make your vagina dry. These include: ? Scented feminine sprays. ? Scented tampons. ?   Scented soaps.  Vaginal intercourse can help to improve blood flow and elasticity of vaginal tissue. If it hurts to have sex, try using a lubricant or moisturizer just before having intercourse. Contact a health care provider if:  Your discharge looks different than normal.  Your vagina has an unusual smell.  You have new symptoms.  Your symptoms do not improve with treatment.  Your symptoms get worse. Summary  Atrophic vaginitis is a condition in which the tissues that line the vagina become dry and thin. It is most common in women who have stopped having regular menstrual periods (are in menopause).  Treatment options include using vaginal lubricants and low-dose vaginal estrogen.  Contact a health care provider if your vagina has an unusual smell, or if your symptoms get worse or do not improve after treatment. This  information is not intended to replace advice given to you by your health care provider. Make sure you discuss any questions you have with your health care provider. Document Revised: 12/25/2016 Document Reviewed: 10/08/2016 Elsevier Patient Education  2020 Elsevier Inc.  

## 2019-07-18 NOTE — Telephone Encounter (Signed)
Spoke with Maria Nunez at [facility].  Patient scheduled for right breast Dx [facility] and [place], if needed, on 08/03/19 at 3pm, arrive at 2:40pm.   Placed in MMG hold.   Patient notified of appt details, patient agreeable to date and time.   Routing to provider for final review. Patient is agreeable to disposition. Will close encounter.

## 2019-07-18 NOTE — Telephone Encounter (Signed)
Please schedule a dx right mammogram and right breast US for persistent pain post right breast biopsy, which was benign.   I do not feel a mass on exam.  [facility]

## 2019-07-18 NOTE — Telephone Encounter (Signed)
Please place in pap recall for 24 months from now.   Patient has a long history of cervical dysplasia and has had a LEEP. She has had a supracervical hysterectomy.

## 2019-07-18 NOTE — Progress Notes (Signed)
59 y.o. G85P2012 Divorced [name] here for annual exam.    States she is feeling breast pain in her right breast following her biopsy.  She did work outside last night and wondered if this caused the pain to increase.   She states a green discharge when she "ovulates." Has hot flashes but they are manageable.  Denies bleeding.  No odor.   Not sexually active.   Working from home.   Completed Covid vaccine.   PCP:  Beatrice Lecher, MD   Patient's last menstrual period was 01/27/2000 (approximate).           Sexually active: No.  The current method of family planning is status post hysterectomy.    Exercising: Yes.    Treadmill and walks her dog Smoker:  no  Health Maintenance: Pap: 07/11/18 - Neg:Neg HR HPV, 03/09/17 Neg:Neg HR HPV, 11/14/15 - Neg:neg HR HPV, 10/24/14 - Neg:neg HR HPV. History of abnormal Pap:  Yes, 03/24/13 LSIL CIN I;PosHR HPV. Leep 4/9/15showing HPV effect only. No dysplasia. MMG: 09-08-18--See Epic.  Biopsy done 09/16/18 showing fibroadenomatoid nodule.  Colonoscopy:  07-24-13 normal with Dr. Lenise Herald 06/2023 BMD:  N/A Result :N/A TDaP: 2016 Gardasil:   no HIV: 03-23-14 NR Hep C: 03-23-14 Neg Screening Labs:  PCP.   reports that she has never smoked. She has never used smokeless tobacco. She reports that she does not drink alcohol and does not use drugs.  Past Medical History:  Diagnosis Date  . Abnormal ultrasound of ovary 2016   Ultrasound showing bilateral ovaries in contact with one another.  Asymptomatic.  [name] Allergy   . Diabetes mellitus    borderline  . Fibroid   . Heart murmur   . Hidradenitis   . High cholesterol   . Hypertension     Past Surgical History:  Procedure Laterality Date  . ABDOMINAL HYSTERECTOMY     supracervical  . BREAST EXCISIONAL BIOPSY Right   . BREAST SURGERY     cysts removed right breast  . BREAST SURGERY  08/2018   benign mass  . CARPAL TUNNEL RELEASE Right 01/08/2017   Procedure: CARPAL TUNNEL  RELEASE, POSSIBLE RIGHT THUMB CARPOMETACARPAL INJECTION;  Surgeon: Roseanne Kaufman, MD;  Location: [facility];  Service: Orthopedics;  Laterality: Right;  60 MINS  . CESAREAN SECTION    . INGUINAL HIDRADENITIS EXCISION    . LEEP  05/04/13   HPV change    Current Outpatient Medications  Medication Sig Dispense Refill  . Ascorbic Acid (VITAMIN C) 1000 MG tablet Take 1 tablet by mouth daily.    [name] aspirin 81 MG tablet Take 81 mg by mouth daily.      . Blood Glucose Monitoring Suppl (FREESTYLE LITE) DEVI     . celecoxib (CELEBREX) 200 MG capsule One to 2 tablets by mouth daily as needed for pain. 60 capsule 2  . Cyanocobalamin (B-12) 500 MCG TABS Take 1 tablet by mouth daily.    . fluticasone (FLONASE) 50 MCG/ACT nasal spray Place 2 sprays into both nostrils daily. 16 g 6  . FOLIC ACID PO Take 419 mcg by mouth daily.     [name] glucose blood (FREESTYLE LITE) test strip For use when testing blood sugars twice daily. DX:E11.69 400 each 12  . Lancets (FREESTYLE) lancets For use when testing blood sugars daily. DX:E11.69 200 each 12  . losartan-hydrochlorothiazide (HYZAAR) 100-25 MG tablet TAKE 1 TABLET BY MOUTH DAILY. 90 tablet 1  . metFORMIN (GLUCOPHAGE) 1000 MG tablet TAKE 1  TABLET BY MOUTH 2 TIMES DAILY WITH A MEAL. 180 tablet 0  . Multiple Vitamin (MULTIVITAMIN) tablet Take 1 tablet by mouth daily.      . Omega-3 Fatty Acids (FISH OIL) 1000 MG CAPS Take 1,000 mg by mouth daily.     . pravastatin (PRAVACHOL) 40 MG tablet TAKE 1 TABLET BY MOUTH ONCE DAILY 90 tablet 1  . pyridOXINE (VITAMIN B-6) 100 MG tablet Take 100 mg by mouth daily.    . Zinc Sulfate (ZINC 15 PO) Take by mouth.       No current facility-administered medications for this visit.    Family History  Problem Relation Age of Onset  . Asthma Mother   . COPD Mother   . Diabetes Mother   . Hypertension Mother   . Diabetes Father   . Hypertension Father   . Breast cancer Maternal Aunt   . Breast cancer Paternal Aunt      Review of Systems  All other systems reviewed and are negative.   Exam:   BP 138/66 (Cuff Size: Large)   Pulse 84   Temp (!) 97.1 F (36.2 C) (Temporal)   Resp 18   Ht 5' (1.524 m)   Wt 170 lb (77.1 kg)   LMP 01/27/2000 (Approximate)   BMI 33.20 kg/m     General appearance: alert, cooperative and appears stated age Head: normocephalic, without obvious abnormality, atraumatic Neck: no adenopathy, supple, symmetrical, trachea midline and thyroid normal to inspection and palpation Lungs: clear to auscultation bilaterally Breasts: normal appearance, no masses or tenderness, No nipple retraction or dimpling, No nipple discharge or bleeding, No axillary adenopathy Heart: regular rate and rhythm Abdomen: soft, non-tender; no masses, no organomegaly Extremities: extremities normal, atraumatic, no cyanosis or edema Skin: skin color, texture, turgor normal. No rashes or lesions Lymph nodes: cervical, supraclavicular, and axillary nodes normal. Neurologic: grossly normal  Pelvic: External genitalia:  no lesions              No abnormal inguinal nodes palpated.              Urethra:  normal appearing urethra with no masses, tenderness or lesions              Bartholins and Skenes: normal                 Vagina:  Atrophy changes with erythema of the mucosa and orange discharge.               Cervix: no lesions              Pap taken: No. Bimanual Exam:  Uterus: absent.              Adnexa: no mass, fullness, tenderness              Rectal exam: Yes.  .  Confirms.              Anus:  normal sphincter tone, no lesions  Chaperone was present for exam.  Assessment:   Well woman visit with normal exam. Hxsupracervical hysterectomy.  Hx LGSIL pap. LEEP showing HPV effect in 2015.   Cervical stenosis.  Follow up paps and HR HPV negative for 5 years following this. Status post right breast biopsies.  Right breast pain following biopsy 09/16/18.  Vaginal atrophy.  Menopausal symptoms.   Abnormal ovaries on ultrasound. Touchingone another. DM. Hx vulvar abscess.  Plan: Right breast dx mammogram and right breast US.  Self breast awareness  reviewed. Pap and HR HPV every 3 years.  Guidelines for Calcium, Vitamin D, regular exercise program including cardiovascular and weight bearing exercise. Patient declines vaginal estrogen.  Check FSH and E2.  Follow up annually and prn.   After visit summary provided.

## 2019-07-19 LAB — FOLLICLE STIMULATING HORMONE: FSH: 77.8 m[IU]/mL

## 2019-07-19 LAB — ESTRADIOL: Estradiol: <5 pg/mL

## 2019-07-20 NOTE — Telephone Encounter (Signed)
24 mo recall entered

## 2019-07-25 DIAGNOSIS — M546 Pain in thoracic spine: Secondary | ICD-10-CM | POA: Diagnosis not present

## 2019-07-25 DIAGNOSIS — M62830 Muscle spasm of back: Secondary | ICD-10-CM | POA: Diagnosis not present

## 2019-07-25 DIAGNOSIS — M542 Cervicalgia: Secondary | ICD-10-CM | POA: Diagnosis not present

## 2019-07-25 DIAGNOSIS — M545 Low back pain: Secondary | ICD-10-CM | POA: Diagnosis not present

## 2019-07-26 ENCOUNTER — Encounter: Payer: Self-pay | Admitting: Family Medicine

## 2019-07-27 ENCOUNTER — Other Ambulatory Visit: Payer: Self-pay

## 2019-07-27 ENCOUNTER — Other Ambulatory Visit: Payer: Self-pay | Admitting: Family Medicine

## 2019-07-27 ENCOUNTER — Other Ambulatory Visit: Payer: Self-pay | Admitting: Obstetrics and Gynecology

## 2019-07-27 DIAGNOSIS — N644 Mastodynia: Secondary | ICD-10-CM

## 2019-07-27 MED ORDER — PRAVASTATIN SODIUM 40 MG PO TABS: 40.0000 mg | ORAL_TABLET | Freq: Every day | ORAL | 0 refills | Status: DC

## 2019-07-27 MED FILL — LOSARTAN-HCTZ 100-25 MG TAB: 100-25 | 90 days supply | Qty: 90 | Fill #0

## 2019-07-27 MED FILL — FREESTYLE LITE TEST STRIP: 75 days supply | Qty: 150 | Fill #0

## 2019-07-28 MED FILL — METFORMIN HCL 1000 MG TABS: 1000 | 30 days supply | Qty: 30 | Fill #0

## 2019-07-28 MED FILL — PRAVASTATIN NA 40 MG TAB: 40 | 30 days supply | Qty: 30 | Fill #0

## 2019-08-03 ENCOUNTER — Other Ambulatory Visit: Payer: 59

## 2019-08-07 ENCOUNTER — Other Ambulatory Visit: Payer: Self-pay

## 2019-08-07 ENCOUNTER — Ambulatory Visit: Payer: 59 | Admitting: Family Medicine

## 2019-08-07 ENCOUNTER — Encounter: Payer: Self-pay | Admitting: Family Medicine

## 2019-08-07 VITALS — BP 130/54 | HR 81 | Ht 60.0 in | Wt 167.0 lb

## 2019-08-07 DIAGNOSIS — E1169 Type 2 diabetes mellitus with other specified complication: Secondary | ICD-10-CM

## 2019-08-07 DIAGNOSIS — Z0000 Encounter for general adult medical examination without abnormal findings: Secondary | ICD-10-CM | POA: Diagnosis not present

## 2019-08-07 DIAGNOSIS — I10 Essential (primary) hypertension: Secondary | ICD-10-CM | POA: Diagnosis not present

## 2019-08-07 DIAGNOSIS — E785 Hyperlipidemia, unspecified: Secondary | ICD-10-CM | POA: Diagnosis not present

## 2019-08-07 LAB — POCT GLYCOSYLATED HEMOGLOBIN (HGB A1C): Hemoglobin A1C: 7.3 % — AB (ref 4.0–5.6)

## 2019-08-07 MED ORDER — PRAVASTATIN SODIUM 40 MG PO TABS: 40.0000 mg | ORAL_TABLET | Freq: Every day | ORAL | 3 refills | Status: DC

## 2019-08-07 MED ORDER — METFORMIN HCL 1000 MG PO TABS: 1000.0000 mg | ORAL_TABLET | Freq: Two times a day (BID) | ORAL | 1 refills | Status: DC

## 2019-08-07 NOTE — Assessment & Plan Note (Signed)
Pressure looks great today. 

## 2019-08-07 NOTE — Assessment & Plan Note (Signed)
Uncontrolled.  In fact her A1c went up to 7.3.  We discussed getting back on track with eating more consistently.  She has been walking on the treadmill regularly which is fantastic so just encouraged her to keep that up.  But since she is been able to work from home just encouraged her to really work on dietary choices she has been eating little amounts candy bars recently so also encouraged her to back off of that as well.  Follow-up in 3 months.

## 2019-08-07 NOTE — Progress Notes (Signed)
Established Patient Office Visit  Subjective:  Patient ID: Maria Nunez, female    DOB: July 08, 1960  Age: 59 y.o. MRN: [id number]  CC:  Chief Complaint  Patient presents with  . Diabetes  . Hypertension    HPI [name] presents for   Diabetes -he has been having some scattered hypoglycemic events, usually when she has not eaten or has delayed eating.  But she is also been having some high blood sugars she just feels like they are all over the place and then other days it is actually good.. No wounds or sores that are not healing well. No increased thirst or urination. Checking glucose at home. Taking medications as prescribed without any side effects. She has been walking on the treadmill daly.  He does need refills on her Metformin.  She said a lot of family stressors going on.  One of her daughters dislocated her shoulder trying to learn how to write a megacycle.  She says that she has been really try to help her with appointments etc.  And her other daughter who was engaged recently broke off the engagement.   Past Medical History:  Diagnosis Date  . Abnormal ultrasound of ovary 2016   Ultrasound showing bilateral ovaries in contact with one another.  Asymptomatic.  [name] Allergy   . Diabetes mellitus    borderline  . Fibroid   . Heart murmur   . Hidradenitis   . High cholesterol   . Hypertension     Past Surgical History:  Procedure Laterality Date  . ABDOMINAL HYSTERECTOMY     supracervical  . BREAST EXCISIONAL BIOPSY Right   . BREAST SURGERY     cysts removed right breast  . BREAST SURGERY  08/2018   benign mass  . CARPAL TUNNEL RELEASE Right 01/08/2017   Procedure: CARPAL TUNNEL RELEASE, POSSIBLE RIGHT THUMB CARPOMETACARPAL INJECTION;  Surgeon: Roseanne Kaufman, MD;  Location: [facility];  Service: Orthopedics;  Laterality: Right;  60 MINS  . CESAREAN SECTION    . INGUINAL HIDRADENITIS EXCISION    . LEEP  05/04/13   HPV change     Family History  Problem Relation Age of Onset  . Asthma Mother   . COPD Mother   . Diabetes Mother   . Hypertension Mother   . Diabetes Father   . Hypertension Father   . Breast cancer Maternal Aunt   . Breast cancer Paternal Aunt     Social History   Socioeconomic History  . Marital status: Divorced    Spouse name: Not on file  . Number of children: Not on file  . Years of education: Not on file  . Highest education level: Not on file  Occupational History  . Not on file  Tobacco Use  . Smoking status: Never Smoker  . Smokeless tobacco: Never Used  Vaping Use  . Vaping Use: Never used  Substance and Sexual Activity  . Alcohol use: No    Alcohol/week: 0.0 standard drinks  . Drug use: No  . Sexual activity: Not Currently    Partners: Male    Birth control/protection: Surgical    Comment: TAH/supracervical  Other Topics Concern  . Not on file  Social History Narrative  . Not on file   Social Determinants of Health   Financial Resource Strain:   . Difficulty of Paying Living Expenses:   Food Insecurity:   . Worried About Charity fundraiser in the Last Year:   .  Ran Out of Food in the Last Year:   Transportation Needs:   . Film/video editor (Medical):   [name] Lack of Transportation (Non-Medical):   Physical Activity:   . Days of Exercise per Week:   . Minutes of Exercise per Session:   Stress:   . Feeling of Stress :   Social Connections:   . Frequency of Communication with Friends and Family:   . Frequency of Social Gatherings with Friends and Family:   . Attends Religious Services:   . Active Member of Clubs or Organizations:   . Attends Archivist Meetings:   [name] Marital Status:   Intimate Partner Violence:   . Fear of Current or Ex-Partner:   . Emotionally Abused:   [name] Physically Abused:   . Sexually Abused:     Outpatient Medications Prior to Visit  Medication Sig Dispense Refill  . Ascorbic Acid (VITAMIN C) 1000 MG tablet Take 1  tablet by mouth daily.    [name] aspirin 81 MG tablet Take 81 mg by mouth daily.      . Blood Glucose Monitoring Suppl (FREESTYLE LITE) DEVI     . celecoxib (CELEBREX) 200 MG capsule One to 2 tablets by mouth daily as needed for pain. 60 capsule 2  . Cyanocobalamin (B-12) 500 MCG TABS Take 1 tablet by mouth daily.    . fluticasone (FLONASE) 50 MCG/ACT nasal spray Place 2 sprays into both nostrils daily. 16 g 6  . FOLIC ACID PO Take 093 mcg by mouth daily.     [name] glucose blood (FREESTYLE LITE) test strip For use when testing blood sugars twice daily. DX:E11.69 400 each 12  . Lancets (FREESTYLE) lancets For use when testing blood sugars daily. DX:E11.69 200 each 12  . losartan-hydrochlorothiazide (HYZAAR) 100-25 MG tablet TAKE 1 TABLET BY MOUTH DAILY. 90 tablet 1  . Multiple Vitamin (MULTIVITAMIN) tablet Take 1 tablet by mouth daily.      . Omega-3 Fatty Acids (FISH OIL) 1000 MG CAPS Take 1,000 mg by mouth daily.     [name] pyridOXINE (VITAMIN B-6) 100 MG tablet Take 100 mg by mouth daily.    . Zinc Sulfate (ZINC 15 PO) Take by mouth.      . metFORMIN (GLUCOPHAGE) 1000 MG tablet Take 1 tablet (1,000 mg total) by mouth daily. Needs to complete labs 30 tablet 0  . pravastatin (PRAVACHOL) 40 MG tablet Take 1 tablet (40 mg total) by mouth daily. Needs to complete labs for refills. 30 tablet 0   No facility-administered medications prior to visit.    Allergies  Allergen Reactions  . Sulfonamide Derivatives Hives    REACTION: hives  . Xigduo Xr [Dapagliflozin-Metformin Hcl Er] Hives  . Hycodan [Hydrocodone-Homatropine] Itching  . Cefaclor Rash  . Cephalexin Rash  . Januvia [Sitagliptin] Nausea Only    Back pain  . Levofloxacin Itching and Rash  . Penicillins Other (See Comments)    unknown  . Tetracycline Nausea Only    GI upset  . Victoza [Liraglutide] Other (See Comments)    Back pain    ROS Review of Systems    Objective:    Physical Exam Constitutional:      Appearance: She is  well-developed.  HENT:     Head: Normocephalic and atraumatic.  Cardiovascular:     Rate and Rhythm: Normal rate and regular rhythm.     Heart sounds: Normal heart sounds.  Pulmonary:     Effort: Pulmonary effort is normal.  Breath sounds: Normal breath sounds.  Skin:    General: Skin is warm and dry.  Neurological:     Mental Status: She is alert and oriented to person, place, and time.  Psychiatric:        Behavior: Behavior normal.     BP (!) 130/54   Pulse 81   Ht 5' (1.524 m)   Wt 167 lb (75.8 kg)   LMP 01/27/2000 (Approximate)   SpO2 96%   BMI 32.61 kg/m  Wt Readings from Last 3 Encounters:  08/07/19 167 lb (75.8 kg)  07/18/19 170 lb (77.1 kg)  07/07/19 170 lb (77.1 kg)     There are no preventive care reminders to display for this patient.  There are no preventive care reminders to display for this patient.  Lab Results  Component Value Date   TSH 0.96 11/25/2017   Lab Results  Component Value Date   WBC 10.2 01/13/2019   HGB 14.1 01/13/2019   HCT 41.3 01/13/2019   MCV 87.7 01/13/2019   PLT 300 01/13/2019   Lab Results  Component Value Date   NA 139 01/13/2019   K 3.7 01/13/2019   CO2 29 01/13/2019   GLUCOSE 113 (H) 01/13/2019   BUN 21 (H) 01/13/2019   CREATININE 0.69 01/13/2019   BILITOT 0.4 03/28/2018   ALKPHOS 76 10/29/2015   AST 27 03/28/2018   ALT 41 (H) 03/28/2018   PROT 6.8 03/28/2018   ALBUMIN 4.4 10/29/2015   CALCIUM 9.6 01/13/2019   ANIONGAP 13 01/13/2019   Lab Results  Component Value Date   CHOL 158 03/28/2018   Lab Results  Component Value Date   HDL 49 (L) 03/28/2018   Lab Results  Component Value Date   LDLCALC 84 03/28/2018   Lab Results  Component Value Date   TRIG 146 03/28/2018   Lab Results  Component Value Date   CHOLHDL 3.2 03/28/2018   Lab Results  Component Value Date   HGBA1C 7.3 (A) 08/07/2019      Assessment & Plan:   Problem List Items Addressed This Visit      Cardiovascular and  Mediastinum   HYPERTENSION, BENIGN SYSTEMIC    Pressure looks great today.      Relevant Medications   pravastatin (PRAVACHOL) 40 MG tablet     Endocrine   Type 2 diabetes mellitus with other specified complication ([place]) - Primary    Uncontrolled.  In fact her A1c went up to 7.3.  We discussed getting back on track with eating more consistently.  She has been walking on the treadmill regularly which is fantastic so just encouraged her to keep that up.  But since she is been able to work from home just encouraged her to really work on dietary choices she has been eating little amounts candy bars recently so also encouraged her to back off of that as well.  Follow-up in 3 months.      Relevant Medications   metFORMIN (GLUCOPHAGE) 1000 MG tablet   pravastatin (PRAVACHOL) 40 MG tablet   Other Relevant Orders   POCT glycosylated hemoglobin (Hb A1C) (Completed)   Hyperlipidemia associated with type 2 diabetes mellitus ([place])    Ahead and refill her statin for a year she did go for blood work this morning.      Relevant Medications   metFORMIN (GLUCOPHAGE) 1000 MG tablet   pravastatin (PRAVACHOL) 40 MG tablet      Meds ordered this encounter  Medications  . metFORMIN (GLUCOPHAGE) 1000  MG tablet    Sig: Take 1 tablet (1,000 mg total) by mouth 2 (two) times daily with a meal. Needs to complete labs    Dispense:  180 tablet    Refill:  1  . pravastatin (PRAVACHOL) 40 MG tablet    Sig: Take 1 tablet (40 mg total) by mouth at bedtime. Needs to complete labs for refills.    Dispense:  90 tablet    Refill:  3    Follow-up: Return in about 3 months (around 11/07/2019) for Diabetes follow-up.    Beatrice Lecher, MD

## 2019-08-07 NOTE — Assessment & Plan Note (Signed)
Ahead and refill her statin for a year she did go for blood work this morning.

## 2019-08-08 LAB — CBC
HCT: 39.7 % (ref 35.0–45.0)
Hemoglobin: 13.2 g/dL (ref 11.7–15.5)
MCH: 29.5 pg (ref 27.0–33.0)
MCHC: 33.2 g/dL (ref 32.0–36.0)
MCV: 88.6 fL (ref 80.0–100.0)
MPV: 10.2 fL (ref 7.5–12.5)
Platelets: 285 10*3/uL (ref 140–400)
RBC: 4.48 10*6/uL (ref 3.80–5.10)
RDW: 12.5 % (ref 11.0–15.0)
WBC: 6.5 10*3/uL (ref 3.8–10.8)

## 2019-08-08 LAB — COMPLETE METABOLIC PANEL WITH GFR
AG Ratio: 1.8 (calc) (ref 1.0–2.5)
ALT: 49 U/L — ABNORMAL HIGH (ref 6–29)
AST: 27 U/L (ref 10–35)
Albumin: 4.3 g/dL (ref 3.6–5.1)
Alkaline phosphatase (APISO): 74 U/L (ref 37–153)
BUN: 15 mg/dL (ref 7–25)
CO2: 27 mmol/L (ref 20–32)
Calcium: 9.9 mg/dL (ref 8.6–10.4)
Chloride: 103 mmol/L (ref 98–110)
Creat: 0.58 mg/dL (ref 0.50–1.05)
GFR, Est African American: 118 mL/min/{1.73_m2} (ref >=60)
GFR, Est Non African American: 102 mL/min/{1.73_m2} (ref >=60)
Globulin: 2.4 g/dL (calc) (ref 1.9–3.7)
Glucose, Bld: 148 mg/dL — ABNORMAL HIGH (ref 65–99)
Potassium: 3.8 mmol/L (ref 3.5–5.3)
Sodium: 139 mmol/L (ref 135–146)
Total Bilirubin: 0.4 mg/dL (ref 0.2–1.2)
Total Protein: 6.7 g/dL (ref 6.1–8.1)

## 2019-08-08 LAB — HEMOGLOBIN A1C
Hgb A1c MFr Bld: 7.1 % of total Hgb — ABNORMAL HIGH (ref <5.7)
Mean Plasma Glucose: 157 (calc)
eAG (mmol/L): 8.7 (calc)

## 2019-08-08 LAB — LIPID PANEL
Cholesterol: 191 mg/dL (ref <200)
HDL: 44 mg/dL — ABNORMAL LOW (ref >=50)
LDL Cholesterol (Calc): 122 mg/dL (calc) — ABNORMAL HIGH
Non-HDL Cholesterol (Calc): 147 mg/dL (calc) — ABNORMAL HIGH (ref <130)
Total CHOL/HDL Ratio: 4.3 (calc) (ref <5.0)
Triglycerides: 140 mg/dL (ref <150)

## 2019-08-11 ENCOUNTER — Other Ambulatory Visit: Payer: Self-pay | Admitting: Family Medicine

## 2019-08-11 ENCOUNTER — Telehealth: Payer: Self-pay

## 2019-08-11 MED ORDER — ROSUVASTATIN CALCIUM 40 MG PO TABS: 40.0000 mg | ORAL_TABLET | Freq: Every day | ORAL | 3 refills | Status: DC

## 2019-08-11 MED FILL — ROSUVASTATIN CALCIUM 40 MG: 40 | 90 days supply | Qty: 90 | Fill #0

## 2019-08-11 NOTE — Telephone Encounter (Signed)
Patient requested that rosuvastatin be sent to [facility] instead of [facility].

## 2019-08-15 ENCOUNTER — Encounter: Payer: Self-pay | Admitting: Family Medicine

## 2019-08-15 ENCOUNTER — Other Ambulatory Visit: Payer: Self-pay | Admitting: Family Medicine

## 2019-08-15 MED ORDER — METFORMIN HCL 1000 MG PO TABS: 1000.0000 mg | ORAL_TABLET | Freq: Two times a day (BID) | ORAL | 1 refills | Status: DC

## 2019-08-17 MED FILL — METFORMIN HCL 1000 MG TABS: 1000 | 90 days supply | Qty: 180 | Fill #0

## 2019-08-24 ENCOUNTER — Encounter: Payer: Self-pay | Admitting: Family Medicine

## 2019-08-28 NOTE — Telephone Encounter (Signed)
OK, have her cut tab in half and take 1/2 tab BID and see if feels better.

## 2019-09-11 ENCOUNTER — Other Ambulatory Visit: Payer: 59

## 2019-09-12 ENCOUNTER — Other Ambulatory Visit: Payer: Self-pay

## 2019-09-12 ENCOUNTER — Ambulatory Visit
Admission: RE | Admit: 2019-09-12 | Discharge: 2019-09-12 | Disposition: A | Discharge status: Home / Self Care | Payer: 59 | Source: Ambulatory Visit | Attending: Obstetrics and Gynecology | Admitting: Obstetrics and Gynecology

## 2019-09-12 ENCOUNTER — Ambulatory Visit: Payer: 59

## 2019-09-12 DIAGNOSIS — N644 Mastodynia: Secondary | ICD-10-CM

## 2019-09-12 DIAGNOSIS — R922 Inconclusive mammogram: Secondary | ICD-10-CM | POA: Diagnosis not present

## 2019-09-19 ENCOUNTER — Ambulatory Visit: Payer: 59 | Admitting: Sports Medicine

## 2019-09-19 DIAGNOSIS — M722 Plantar fascial fibromatosis: Secondary | ICD-10-CM

## 2019-09-19 NOTE — Progress Notes (Signed)
    Procedures performed today:    None.  Independent interpretation of notes and tests performed by another provider:   None.  Brief History, Exam, Impression, and Recommendations:    Maria Nunez is a pleasant 59yo female who presented today with right foot pain that has been ongoing for about a month now. The pain is mainly located in the heel of her foot but sometimes radiates throughout the sole. She says the pain is aggravated by walking and exercise. It has limited her ability to do activities. There is no particular time of day that it hurts more. She had plantar fasciitis of the left foot previously that feels very similar to this pain which completely resolved with an injection years ago. We are going to start conservatively today with an air heel brace, orthotics referral, continue celebrex, and at home exercises. She can follow up in 4-6 weeks if no better for reevaluation and possible injection.    Marcelino Duster, MS3   ___________________________________________ Gwen Her. Dianah Field, M.D., ABFM., CAQSM. Primary Care and [place] Instructor of [place] of [facility] Division of Medicine

## 2019-09-19 NOTE — Assessment & Plan Note (Signed)
This is a very pleasant 59 year old female, she has history of left plantar fasciitis, injected several years ago with good relief, she is now having discomfort on her right heel, worse in the morning, worse after mowing the grass. Minimal tenderness over the plantar aspect of the heel, moderate pes cavus. We are going to start conservatively, avoidance of barefoot walking, air heel brace, continue Celebrex, referral for custom orthotics, rehab exercises, return to see me in 4 weeks, injection if no better.

## 2019-10-09 ENCOUNTER — Telehealth: Payer: Self-pay | Admitting: Family Medicine

## 2019-10-09 DIAGNOSIS — L820 Inflamed seborrheic keratosis: Secondary | ICD-10-CM | POA: Diagnosis not present

## 2019-10-09 DIAGNOSIS — L918 Other hypertrophic disorders of the skin: Secondary | ICD-10-CM | POA: Diagnosis not present

## 2019-10-09 DIAGNOSIS — L821 Other seborrheic keratosis: Secondary | ICD-10-CM | POA: Diagnosis not present

## 2019-10-09 NOTE — Telephone Encounter (Signed)
Patient sent message she ws feeling much better & req'd appt for 9/21 be cancelled w/Dr.Schmitz--cb advised it has been done.  --glh

## 2019-10-16 ENCOUNTER — Emergency Department
Admission: RE | Admit: 2019-10-16 | Discharge: 2019-10-16 | Disposition: A | Discharge status: Home / Self Care | Payer: 59 | Source: Ambulatory Visit

## 2019-10-16 ENCOUNTER — Other Ambulatory Visit: Payer: Self-pay

## 2019-10-16 VITALS — BP 133/74 | HR 80 | Temp 98.8°F | Resp 17

## 2019-10-16 DIAGNOSIS — R0981 Nasal congestion: Secondary | ICD-10-CM | POA: Diagnosis not present

## 2019-10-16 DIAGNOSIS — J019 Acute sinusitis, unspecified: Secondary | ICD-10-CM

## 2019-10-16 DIAGNOSIS — R519 Headache, unspecified: Secondary | ICD-10-CM

## 2019-10-16 MED ORDER — AZITHROMYCIN 250 MG PO TABS: 250.0000 mg | ORAL_TABLET | Freq: Every day | ORAL | 0 refills | Status: DC

## 2019-10-16 MED ORDER — ONDANSETRON 4 MG PO TBDP: 4.0000 mg | ORAL_TABLET | Freq: Three times a day (TID) | ORAL | 0 refills | Status: DC | PRN

## 2019-10-16 NOTE — ED Triage Notes (Signed)
Pt c/o sinus issues x 4 weeks. Congestion, nausea and slight headache. Sudafed and ibuprofen as needed. Warm compress on forehead. Also flonase prn. No known covid exposure. Works from home. Has had both covid vaccinations. 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

## 2019-10-16 NOTE — ED Provider Notes (Signed)
[name]    CSN: [id number] Arrival date & time: 10/16/19  1756      History   Chief Complaint Chief Complaint  Patient presents with  . Appointment    6pm  . Sinus issues    HPI Maria Nunez is a 59 y.o. female.   HPI Maria Nunez is a 59 y.o. female presenting to UC with c/o sinus congestion and pressure with occasional post-nasal drainage that causes nausea for about 4 weeks. C/w prior sinus infections.  She started sudafed and ibuprofen this past week with mild relief. She also uses flonase w/o relief. Denies fever, chills, n/v/d. Pt works from home, fully vaccinated with [employer] vaccine in April.  No known sick contacts but would like to be tested.    Past Medical History:  Diagnosis Date  . Abnormal ultrasound of ovary 2016   Ultrasound showing bilateral ovaries in contact with one another.  Asymptomatic.  [name] Allergy   . Diabetes mellitus    borderline  . Fibroid   . Heart murmur   . Hidradenitis   . High cholesterol   . Hypertension     Patient Active Problem List   Diagnosis Date Noted  . Plantar fasciitis, right 09/19/2019  . Pain in right hand 02/21/2018  . Bursitis of left shoulder 01/24/2016  . GAD (generalized anxiety disorder) 04/02/2015  . Localized primary osteoarthritis of carpometacarpal joint of right thumb 03/15/2015  . Carpal tunnel syndrome, bilateral 04/05/2013  . LGSIL (low grade squamous intraepithelial dysplasia) 06/16/2012  . Type 2 diabetes mellitus with other specified complication ([place]) 25/36/6440  . PANIC ATTACK 09/07/2008  . RESTLESS LEG SYNDROME 04/02/2008  . POLYARTHRITIS 04/02/2008  . Primary osteoarthritis of left knee 01/02/2008  . HOT FLASHES 08/04/2006  . Hyperlipidemia associated with type 2 diabetes mellitus ([place]) 11/03/2005  . HYPERTENSION, BENIGN SYSTEMIC 11/03/2005    Past Surgical History:  Procedure Laterality Date  . ABDOMINAL HYSTERECTOMY     supracervical  . BREAST  EXCISIONAL BIOPSY Right   . BREAST SURGERY     cysts removed right breast  . BREAST SURGERY  08/2018   benign mass  . CARPAL TUNNEL RELEASE Right 01/08/2017   Procedure: CARPAL TUNNEL RELEASE, POSSIBLE RIGHT THUMB CARPOMETACARPAL INJECTION;  Surgeon: Roseanne Kaufman, MD;  Location: [place];  Service: Orthopedics;  Laterality: Right;  60 MINS  . CESAREAN SECTION    . INGUINAL HIDRADENITIS EXCISION    . LEEP  05/04/13   HPV change    OB History    Gravida  3   Para  2   Term  2   Preterm      AB  1   Living  2     SAB  1   TAB      Ectopic      Multiple      Live Births               Home Medications    Prior to Admission medications   Medication Sig Start Date End Date Taking? Authorizing Provider  Ascorbic Acid (VITAMIN C) 1000 MG tablet Take 1 tablet by mouth daily. 04/10/18   [provider]  aspirin 81 MG tablet Take 81 mg by mouth daily.      [provider]  azithromycin (ZITHROMAX) 250 MG tablet Take 1 tablet (250 mg total) by mouth daily. Take first 2 tablets together, then 1 every day until finished. 10/16/19   Leeroy Cha  O, PA-C  Blood Glucose Monitoring Suppl (FREESTYLE LITE) DEVI  03/13/19   [provider]  celecoxib (CELEBREX) 200 MG capsule One to 2 tablets by mouth daily as needed for pain. 06/21/19   Silverio Decamp, MD  Cyanocobalamin (B-12) 500 MCG TABS Take 1 tablet by mouth daily.    [provider]  fluticasone (FLONASE) 50 MCG/ACT nasal spray Place 2 sprays into both nostrils daily. 03/13/19   Hali Marry, MD  FOLIC ACID PO Take 409 mcg by mouth daily.     [provider]  glucose blood (FREESTYLE LITE) test strip For use when testing blood sugars twice daily. DX:E11.69 03/13/19   Hali Marry, MD  Lancets (FREESTYLE) lancets For use when testing blood sugars daily. DX:E11.69 03/13/19   Hali Marry, MD  losartan-hydrochlorothiazide (HYZAAR) 100-25  MG tablet TAKE 1 TABLET BY MOUTH DAILY. 04/27/19   Hali Marry, MD  metFORMIN (GLUCOPHAGE) 1000 MG tablet Take 1 tablet (1,000 mg total) by mouth 2 (two) times daily with a meal. 08/15/19   Hali Marry, MD  Multiple Vitamin (MULTIVITAMIN) tablet Take 1 tablet by mouth daily.      [provider]  Omega-3 Fatty Acids (FISH OIL) 1000 MG CAPS Take 1,000 mg by mouth daily.     [provider]  ondansetron (ZOFRAN-ODT) 4 MG disintegrating tablet Take 1 tablet (4 mg total) by mouth every 8 (eight) hours as needed for nausea or vomiting. 10/16/19   Noe Gens, PA-C  pyridOXINE (VITAMIN B-6) 100 MG tablet Take 100 mg by mouth daily.    [provider]  rosuvastatin (CRESTOR) 40 MG tablet Take 1 tablet (40 mg total) by mouth daily. 08/11/19   Hali Marry, MD  Zinc Sulfate (ZINC 15 PO) Take by mouth.      [provider]    Family History Family History  Problem Relation Age of Onset  . Asthma Mother   . COPD Mother   . Diabetes Mother   . Hypertension Mother   . Diabetes Father   . Hypertension Father   . Breast cancer Maternal Aunt   . Breast cancer Paternal Aunt     Social History Social History   Tobacco Use  . Smoking status: Never Smoker  . Smokeless tobacco: Never Used  Vaping Use  . Vaping Use: Never used  Substance Use Topics  . Alcohol use: No    Alcohol/week: 0.0 standard drinks  . Drug use: No     Allergies   Sulfonamide derivatives, Xigduo xr [dapagliflozin-metformin hcl er], Hycodan [hydrocodone-homatropine], Cefaclor, Cephalexin, Januvia [sitagliptin], Levofloxacin, Penicillins, Tetracycline, and Victoza [liraglutide]   Review of Systems Review of Systems  Constitutional: Negative for chills and fever.  HENT: Positive for congestion, postnasal drip, sinus pressure, sinus pain and sore throat. Negative for ear pain, trouble swallowing and voice change.   Respiratory: Negative for cough and shortness of  breath.   Cardiovascular: Negative for chest pain and palpitations.  Gastrointestinal: Positive for nausea. Negative for abdominal pain, diarrhea and vomiting.  Musculoskeletal: Negative for arthralgias, back pain and myalgias.  Skin: Negative for rash.  Neurological: Positive for headaches (frontal). Negative for dizziness and light-headedness.  All other systems reviewed and are negative.    Physical Exam Triage Vital Signs ED Triage Vitals  Enc Vitals Group     BP 10/16/19 1818 133/74     Pulse Rate 10/16/19 1818 80     Resp 10/16/19 1818 17  Temp 10/16/19 1818 98.8 F (37.1 C)     Temp Source 10/16/19 1818 Oral     SpO2 10/16/19 1818 96 %     Weight --      Height --      Head Circumference --      Peak Flow --      Pain Score 10/16/19 1820 0     Pain Loc --      Pain Edu? --      Excl. in [place]? --    No data found.  Updated Vital Signs BP 133/74 (BP Location: Right Arm)   Pulse 80   Temp 98.8 F (37.1 C) (Oral)   Resp 17   LMP 01/27/2000 (Approximate)   SpO2 96%   Visual Acuity Right Eye Distance:   Left Eye Distance:   Bilateral Distance:    Right Eye Near:   Left Eye Near:    Bilateral Near:     Physical Exam Vitals and nursing note reviewed.  Constitutional:      General: She is not in acute distress.    Appearance: Normal appearance. She is well-developed. She is not ill-appearing, toxic-appearing or diaphoretic.  HENT:     Head: Normocephalic and atraumatic.     Right Ear: Tympanic membrane and ear canal normal.     Left Ear: Tympanic membrane and ear canal normal.     Nose: Mucosal edema present.     Right Sinus: No maxillary sinus tenderness or frontal sinus tenderness.     Left Sinus: No maxillary sinus tenderness or frontal sinus tenderness.     Mouth/Throat:     Lips: Pink.     Mouth: Mucous membranes are moist.     Pharynx: Oropharynx is clear. Uvula midline. No pharyngeal swelling, oropharyngeal exudate, posterior oropharyngeal  erythema or uvula swelling.  Eyes:     Extraocular Movements: Extraocular movements intact.     Conjunctiva/sclera: Conjunctivae normal.     Pupils: Pupils are equal, round, and reactive to light.  Cardiovascular:     Rate and Rhythm: Normal rate and regular rhythm.  Pulmonary:     Effort: Pulmonary effort is normal. No respiratory distress.     Breath sounds: Normal breath sounds. No stridor. No wheezing, rhonchi or rales.  Musculoskeletal:        General: Normal range of motion.     Cervical back: Normal range of motion and neck supple. No rigidity or tenderness.  Lymphadenopathy:     Cervical: No cervical adenopathy.  Skin:    General: Skin is warm and dry.  Neurological:     Mental Status: She is alert and oriented to person, place, and time.  Psychiatric:        Behavior: Behavior normal.      UC Treatments / Results  Labs (all labs ordered are listed, but only abnormal results are displayed) Labs Reviewed  NOVEL CORONAVIRUS, NAA    EKG   Radiology No results found.  Procedures Procedures (including critical care time)  Medications Ordered in UC Medications - No data to display  Initial Impression / Assessment and Plan / UC Course  I have reviewed the triage vital signs and the nursing notes.  Pertinent labs & imaging results that were available during my care of the patient were reviewed by me and considered in my medical decision making (see chart for details).    Due to duration of symptoms, will cover for bacterial sinusitis. COVID test pending F/u with PCP as needed  AVS given  Final Clinical Impressions(s) / UC Diagnoses   Final diagnoses:  Nasal congestion  Sinus headache  Acute rhinosinusitis     Discharge Instructions     You may take 500mg  acetaminophen every 4-6 hours or in combination with ibuprofen 400-600mg  every 6-8 hours as needed for pain, inflammation, and fever.  Be sure to well hydrated with clear liquids and get at least 8  hours of sleep at night, preferably more while sick.   Please follow up with family medicine in 1 week if needed. Please take antibiotics as prescribed and be sure to complete entire course even if you start to feel better to ensure infection does not come back.     ED Prescriptions    Medication Sig Dispense Auth. Provider   azithromycin (ZITHROMAX) 250 MG tablet Take 1 tablet (250 mg total) by mouth daily. Take first 2 tablets together, then 1 every day until finished. 6 tablet Gerarda Fraction, Edelmira Gallogly O, PA-C   ondansetron (ZOFRAN-ODT) 4 MG disintegrating tablet Take 1 tablet (4 mg total) by mouth every 8 (eight) hours as needed for nausea or vomiting. 12 tablet Noe Gens, [place]     PDMP not reviewed this encounter.   Noe Gens, [place] 10/16/19 1913

## 2019-10-16 NOTE — Discharge Instructions (Addendum)
You may take 500mg acetaminophen every 4-6 hours or in combination with ibuprofen 400-600mg every 6-8 hours as needed for pain, inflammation, and fever. ° °Be sure to well hydrated with clear liquids and get at least 8 hours of sleep at night, preferably more while sick.  ° °Please follow up with family medicine in 1 week if needed. ° °Please take antibiotics as prescribed and be sure to complete entire course even if you start to feel better to ensure infection does not come back. ° °

## 2019-10-17 ENCOUNTER — Ambulatory Visit: Payer: 59 | Admitting: Sports Medicine

## 2019-10-17 ENCOUNTER — Ambulatory Visit: Payer: 59 | Admitting: Family Medicine

## 2019-10-19 LAB — NOVEL CORONAVIRUS, NAA: SARS-CoV-2, NAA: NOT DETECTED

## 2019-11-08 ENCOUNTER — Ambulatory Visit: Payer: 59 | Admitting: Family Medicine

## 2019-11-08 ENCOUNTER — Other Ambulatory Visit: Payer: Self-pay

## 2019-11-08 ENCOUNTER — Encounter: Payer: Self-pay | Admitting: Family Medicine

## 2019-11-08 VITALS — BP 132/72 | HR 82 | Ht 60.0 in | Wt 163.0 lb

## 2019-11-08 DIAGNOSIS — E1169 Type 2 diabetes mellitus with other specified complication: Secondary | ICD-10-CM

## 2019-11-08 DIAGNOSIS — I10 Essential (primary) hypertension: Secondary | ICD-10-CM | POA: Diagnosis not present

## 2019-11-08 DIAGNOSIS — R0683 Snoring: Secondary | ICD-10-CM

## 2019-11-08 DIAGNOSIS — Z23 Encounter for immunization: Secondary | ICD-10-CM | POA: Diagnosis not present

## 2019-11-08 LAB — POCT GLYCOSYLATED HEMOGLOBIN (HGB A1C): Hemoglobin A1C: 6.8 % — AB (ref 4.0–5.6)

## 2019-11-08 MED FILL — ROSUVASTATIN CALCIUM 40 MG: 40 | 90 days supply | Qty: 90 | Fill #1

## 2019-11-08 MED FILL — LOSARTAN-HCTZ 100-25 MG TAB: 100-25 | 90 days supply | Qty: 90 | Fill #0

## 2019-11-08 MED FILL — FREESTYLE LITE TEST STRIP: 75 days supply | Qty: 150 | Fill #1

## 2019-11-08 NOTE — Assessment & Plan Note (Signed)
Well controlled. Continue current regimen. Follow up in  4 mo 

## 2019-11-08 NOTE — Assessment & Plan Note (Addendum)
Much improved!!!  [place] job.  Continue to work on healthy diet and regular exercise.  Well controlled. Continue current regimen. Follow up in  3-4 mo

## 2019-11-08 NOTE — Progress Notes (Addendum)
Established Patient Office Visit  Subjective:  Patient ID: Maria Nunez, female    DOB: [birth year]  Age: 59 y.o. MRN: [id number]  CC:  Chief Complaint  Patient presents with  . Diabetes    HPI Maria Nunez presents for 3 mo f/u   Diabetes - no hypoglycemic events. No wounds or sores that are not healing well. No increased thirst or urination. Checking glucose at home.  Fasting blood sugar this morning was 127.  Taking medications as prescribed without any side effects. A1c was elevated at 7.3 at last OV.  Hypertension- Pt denies chest pain, SOB, dizziness, or heart palpitations.  Taking meds as directed w/o problems.  Denies medication side effects.    She also wanted to discuss getting tested for obstructive sleep apnea.  She does snore. She is interested in a home sleep study.     Past Medical History:  Diagnosis Date  . Abnormal ultrasound of ovary 2016   Ultrasound showing bilateral ovaries in contact with one another.  Asymptomatic.  [name] Allergy   . Diabetes mellitus    borderline  . Fibroid   . Heart murmur   . Hidradenitis   . High cholesterol   . Hypertension     Past Surgical History:  Procedure Laterality Date  . ABDOMINAL HYSTERECTOMY     supracervical  . BREAST EXCISIONAL BIOPSY Right   . BREAST SURGERY     cysts removed right breast  . BREAST SURGERY  08/2018   benign mass  . CARPAL TUNNEL RELEASE Right 01/08/2017   Procedure: CARPAL TUNNEL RELEASE, POSSIBLE RIGHT THUMB CARPOMETACARPAL INJECTION;  Surgeon: Roseanne Kaufman, MD;  Location: [place];  Service: Orthopedics;  Laterality: Right;  60 MINS  . CESAREAN SECTION    . INGUINAL HIDRADENITIS EXCISION    . LEEP  05/04/13   HPV change    Family History  Problem Relation Age of Onset  . Asthma Mother   . COPD Mother   . Diabetes Mother   . Hypertension Mother   . Diabetes Father   . Hypertension Father   . Breast cancer Maternal Aunt   . Breast cancer Paternal  Aunt     Social History   Socioeconomic History  . Marital status: Divorced    Spouse name: Not on file  . Number of children: Not on file  . Years of education: Not on file  . Highest education level: Not on file  Occupational History  . Not on file  Tobacco Use  . Smoking status: Never Smoker  . Smokeless tobacco: Never Used  Vaping Use  . Vaping Use: Never used  Substance and Sexual Activity  . Alcohol use: No    Alcohol/week: 0.0 standard drinks  . Drug use: No  . Sexual activity: Not Currently    Partners: Male    Birth control/protection: Surgical    Comment: TAH/supracervical  Other Topics Concern  . Not on file  Social History Narrative  . Not on file   Social Determinants of Health   Financial Resource Strain:   . Difficulty of Paying Living Expenses: Not on file  Food Insecurity:   . Worried About Charity fundraiser in the Last Year: Not on file  . Ran Out of Food in the Last Year: Not on file  Transportation Needs:   . Lack of Transportation (Medical): Not on file  . Lack of Transportation (Non-Medical): Not on file  Physical Activity:   . Days of  Exercise per Week: Not on file  . Minutes of Exercise per Session: Not on file  Stress:   . Feeling of Stress : Not on file  Social Connections:   . Frequency of Communication with Friends and Family: Not on file  . Frequency of Social Gatherings with Friends and Family: Not on file  . Attends Religious Services: Not on file  . Active Member of Clubs or Organizations: Not on file  . Attends Archivist Meetings: Not on file  . Marital Status: Not on file  Intimate Partner Violence:   . Fear of Current or Ex-Partner: Not on file  . Emotionally Abused: Not on file  . Physically Abused: Not on file  . Sexually Abused: Not on file    Outpatient Medications Prior to Visit  Medication Sig Dispense Refill  . Ascorbic Acid (VITAMIN C) 1000 MG tablet Take 1 tablet by mouth daily.    [name] aspirin 81 MG  tablet Take 81 mg by mouth daily.      . Blood Glucose Monitoring Suppl (FREESTYLE LITE) DEVI     . celecoxib (CELEBREX) 200 MG capsule One to 2 tablets by mouth daily as needed for pain. 60 capsule 2  . Cyanocobalamin (B-12) 500 MCG TABS Take 1 tablet by mouth daily.    . fluticasone (FLONASE) 50 MCG/ACT nasal spray Place 2 sprays into both nostrils daily. 16 g 6  . FOLIC ACID PO Take 564 mcg by mouth daily.     [name] glucose blood (FREESTYLE LITE) test strip For use when testing blood sugars twice daily. DX:E11.69 400 each 12  . Lancets (FREESTYLE) lancets For use when testing blood sugars daily. DX:E11.69 200 each 12  . losartan-hydrochlorothiazide (HYZAAR) 100-25 MG tablet TAKE 1 TABLET BY MOUTH DAILY. 90 tablet 1  . metFORMIN (GLUCOPHAGE) 1000 MG tablet Take 1 tablet (1,000 mg total) by mouth 2 (two) times daily with a meal. 180 tablet 1  . Multiple Vitamin (MULTIVITAMIN) tablet Take 1 tablet by mouth daily.      . Omega-3 Fatty Acids (FISH OIL) 1000 MG CAPS Take 1,000 mg by mouth daily.     . ondansetron (ZOFRAN-ODT) 4 MG disintegrating tablet Take 1 tablet (4 mg total) by mouth every 8 (eight) hours as needed for nausea or vomiting. 12 tablet 0  . pyridOXINE (VITAMIN B-6) 100 MG tablet Take 100 mg by mouth daily.    . rosuvastatin (CRESTOR) 40 MG tablet Take 1 tablet (40 mg total) by mouth daily. 90 tablet 3  . Zinc Sulfate (ZINC 15 PO) Take by mouth.      [name] azithromycin (ZITHROMAX) 250 MG tablet Take 1 tablet (250 mg total) by mouth daily. Take first 2 tablets together, then 1 every day until finished. 6 tablet 0   No facility-administered medications prior to visit.    Allergies  Allergen Reactions  . Sulfonamide Derivatives Hives    REACTION: hives  . Xigduo Xr [Dapagliflozin-Metformin Hcl Er] Hives  . Hycodan [Hydrocodone-Homatropine] Itching  . Cefaclor Rash  . Cephalexin Rash  . Januvia [Sitagliptin] Nausea Only    Back pain  . Levofloxacin Itching and Rash  . Penicillins  Other (See Comments)    unknown  . Tetracycline Nausea Only    GI upset  . Victoza [Liraglutide] Other (See Comments)    Back pain    ROS Review of Systems    Objective:    Physical Exam Constitutional:      Appearance: She is well-developed.  HENT:  Head: Normocephalic and atraumatic.  Cardiovascular:     Rate and Rhythm: Normal rate and regular rhythm.     Heart sounds: Normal heart sounds.  Pulmonary:     Effort: Pulmonary effort is normal.     Breath sounds: Normal breath sounds.  Skin:    General: Skin is warm and dry.  Neurological:     Mental Status: She is alert and oriented to person, place, and time.  Psychiatric:        Behavior: Behavior normal.     BP 132/72   Pulse 82   Ht 5' (1.524 m)   Wt 163 lb (73.9 kg)   LMP 01/27/2000 (Approximate)   SpO2 98%   BMI 31.83 kg/m  Wt Readings from Last 3 Encounters:  11/08/19 163 lb (73.9 kg)  08/07/19 167 lb (75.8 kg)  07/18/19 170 lb (77.1 kg)     There are no preventive care reminders to display for this patient.  There are no preventive care reminders to display for this patient.  Lab Results  Component Value Date   TSH 0.96 11/25/2017   Lab Results  Component Value Date   WBC 6.5 08/07/2019   HGB 13.2 08/07/2019   HCT 39.7 08/07/2019   MCV 88.6 08/07/2019   PLT 285 08/07/2019   Lab Results  Component Value Date   NA 139 08/07/2019   K 3.8 08/07/2019   CO2 27 08/07/2019   GLUCOSE 148 (H) 08/07/2019   BUN 15 08/07/2019   CREATININE 0.58 08/07/2019   BILITOT 0.4 08/07/2019   ALKPHOS 76 10/29/2015   AST 27 08/07/2019   ALT 49 (H) 08/07/2019   PROT 6.7 08/07/2019   ALBUMIN 4.4 10/29/2015   CALCIUM 9.9 08/07/2019   ANIONGAP 13 01/13/2019   Lab Results  Component Value Date   CHOL 191 08/07/2019   Lab Results  Component Value Date   HDL 44 (L) 08/07/2019   Lab Results  Component Value Date   LDLCALC 122 (H) 08/07/2019   Lab Results  Component Value Date   TRIG 140  08/07/2019   Lab Results  Component Value Date   CHOLHDL 4.3 08/07/2019   Lab Results  Component Value Date   HGBA1C 6.8 (A) 11/08/2019      Assessment & Plan:   Problem List Items Addressed This Visit      Cardiovascular and Mediastinum   HYPERTENSION, BENIGN SYSTEMIC    Well controlled. Continue current regimen. Follow up in  4 mo        Endocrine   Type 2 diabetes mellitus with other specified complication ([place]) - Primary    Much improved!!!  Great job.  Continue to work on healthy diet and regular exercise.  Well controlled. Continue current regimen. Follow up in  3-4 mo      Relevant Orders   POCT glycosylated hemoglobin (Hb A1C) (Completed)     Other   Snoring   Relevant Orders   Home sleep test    Other Visit Diagnoses    Need for immunization against influenza       Relevant Orders   Flu Vaccine QUAD 36+ mos IM (Completed)      A1c down to 6.8.  No orders of the defined types were placed in this encounter.   Follow-up: Return in about 3 months (around 02/08/2020) for Diabetes follow-up.    Beatrice Lecher, MD

## 2019-11-09 ENCOUNTER — Encounter: Payer: Self-pay | Admitting: Family Medicine

## 2019-11-09 DIAGNOSIS — R0683 Snoring: Secondary | ICD-10-CM | POA: Insufficient documentation

## 2019-11-17 ENCOUNTER — Ambulatory Visit: Payer: 59 | Admitting: Sports Medicine

## 2019-11-17 ENCOUNTER — Other Ambulatory Visit: Payer: Self-pay

## 2019-11-17 ENCOUNTER — Ambulatory Visit (INDEPENDENT_AMBULATORY_CARE_PROVIDER_SITE_OTHER): Payer: 59

## 2019-11-17 DIAGNOSIS — M1811 Unilateral primary osteoarthritis of first carpometacarpal joint, right hand: Secondary | ICD-10-CM | POA: Diagnosis not present

## 2019-11-17 NOTE — Assessment & Plan Note (Signed)
Recurrence of pain, repeat right first [place] injection today, previously done March of this year. Return as needed. Again we recommend [facility] arthroplasty, patient remains resistant.

## 2019-11-17 NOTE — Progress Notes (Signed)
    Procedures performed today:    Procedure: Real-time Ultrasound Guided injection of the right first [facility] Device: Samsung HS60  Verbal informed consent obtained.  Time-out conducted.  Noted no overlying erythema, induration, or other signs of local infection.  Skin prepped in a sterile fashion.  Local anesthesia: Topical Ethyl chloride.  With sterile technique and under real time ultrasound guidance:  1/2 cc lidocaine, 1/2 cc kenalog 40 injected easily  completed without difficulty  Pain immediately resolved suggesting accurate placement of the medication.  Advised to call if fevers/chills, erythema, induration, drainage, or persistent bleeding.  Images permanently stored and available for review in PACS.  Impression: Technically successful ultrasound guided injection.  Independent interpretation of notes and tests performed by another provider:   None.  Brief History, Exam, Impression, and Recommendations:    Localized primary osteoarthritis of carpometacarpal joint of right thumb Recurrence of pain, repeat right first [facility] injection today, previously done March of this year. Return as needed. Again we recommend [facility] arthroplasty, patient remains resistant.    ___________________________________________ Gwen Her. Dianah Field, M.D., ABFM., CAQSM. Primary Care and [place] Instructor of [place] [facility]

## 2019-11-21 ENCOUNTER — Encounter: Payer: Self-pay | Admitting: Family Medicine

## 2019-11-21 ENCOUNTER — Other Ambulatory Visit: Payer: Self-pay

## 2019-11-21 DIAGNOSIS — R0683 Snoring: Secondary | ICD-10-CM

## 2019-11-23 MED FILL — METFORMIN HCL 1000 MG TABS: 1000 | 90 days supply | Qty: 180 | Fill #1

## 2019-11-28 ENCOUNTER — Encounter: Payer: Self-pay | Admitting: Family Medicine

## 2019-12-01 ENCOUNTER — Ambulatory Visit (INDEPENDENT_AMBULATORY_CARE_PROVIDER_SITE_OTHER): Payer: 59 | Admitting: Sports Medicine

## 2019-12-01 ENCOUNTER — Ambulatory Visit (INDEPENDENT_AMBULATORY_CARE_PROVIDER_SITE_OTHER): Payer: 59

## 2019-12-01 ENCOUNTER — Other Ambulatory Visit: Payer: Self-pay

## 2019-12-01 VITALS — BP 165/81 | HR 72

## 2019-12-01 DIAGNOSIS — M722 Plantar fascial fibromatosis: Secondary | ICD-10-CM

## 2019-12-01 NOTE — Telephone Encounter (Signed)
Patient states she has not heard anything about her at home Cpap, please update patient with this

## 2019-12-01 NOTE — Assessment & Plan Note (Signed)
Maria Nunez returns, she is a pleasant 59 year old female, we had been treating her for right-sided plantar fasciitis since August, she has avoided barefoot walking, she had an air heel brace, Celebrex, custom orthotics, unfortunately she continued to have pain today I injected her plantar fascia origin. I am also adding a nighttime dorsal splint. Return to see me in a month.

## 2019-12-01 NOTE — Progress Notes (Signed)
    Procedures performed today:    Procedure: Real-time Ultrasound Guided injection of the right plantar fascia origin Device: [employer] HS60  Verbal informed consent obtained.  Time-out conducted.  Noted no overlying erythema, induration, or other signs of local infection.  Skin prepped in a sterile fashion.  Local anesthesia: Topical Ethyl chloride.  With sterile technique and under real time ultrasound guidance:  Noted thickened plantar fascia with internal hypoechoic changes consistent with edema, 25-gauge needle advanced just deep to the plantar fascia insertion on the calcaneus, I then injected 1 cc kenalog 40, 1 cc lidocaine, 1 cc bupivacaine. completed without difficulty  Advised to call if fevers/chills, erythema, induration, drainage, or persistent bleeding.  Images permanently stored and available for review in PACS.  Impression: Technically successful ultrasound guided injection.  Independent interpretation of notes and tests performed by another provider:   None.  Brief History, Exam, Impression, and Recommendations:    Plantar fasciitis, right Maria Nunez returns, she is a pleasant 59 year old female, we had been treating her for right-sided plantar fasciitis since August, she has avoided barefoot walking, she had an air heel brace, Celebrex, custom orthotics, unfortunately she continued to have pain today I injected her plantar fascia origin. I am also adding a nighttime dorsal splint. Return to see me in a month.    ___________________________________________ Gwen Her. Dianah Field, M.D., ABFM., CAQSM. Primary Care and [place] Instructor of [place] of [facility]

## 2019-12-05 NOTE — Telephone Encounter (Signed)
These can take up to 2 weeks to be approved. The order has been sent they are probably just waiting on approval before calling patient - CF

## 2019-12-29 ENCOUNTER — Ambulatory Visit: Payer: 59 | Admitting: Sports Medicine

## 2019-12-30 IMAGING — MG DIGITAL DIAGNOSTIC UNILATERAL RIGHT MAMMOGRAM WITH TOMO AND CAD
4 series · 4 of 12 positions shown · non-contrast
Comparison: Previous exam(s).

CLINICAL DATA: The patient was called back for a probable right
breast mass.

EXAM:
DIGITAL DIAGNOSTIC RIGHT MAMMOGRAM WITH TOMO
ULTRASOUND RIGHT BREAST

[R ML synth-2D]
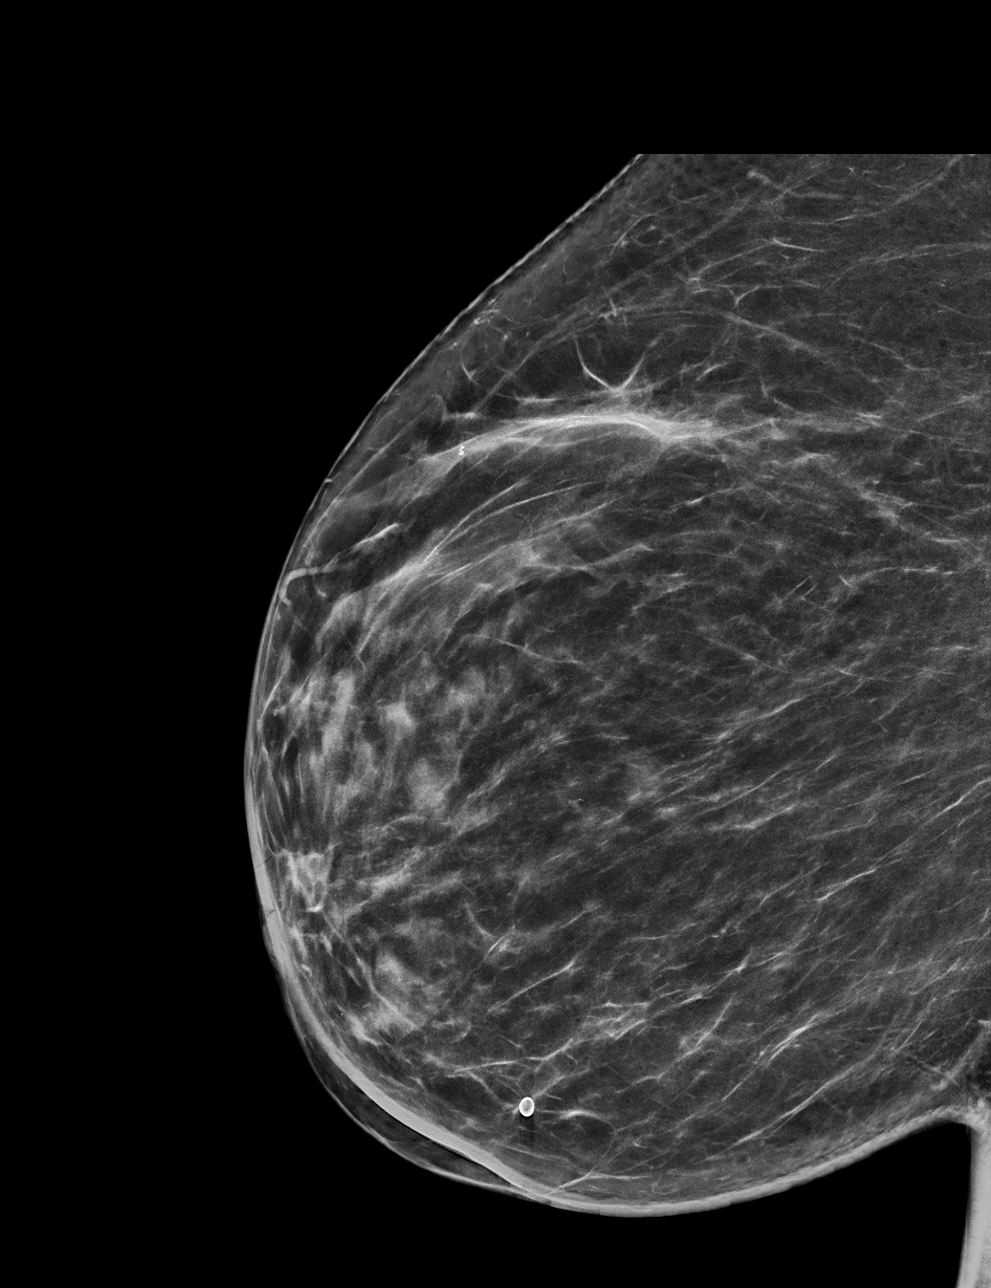

[R MLO synth-2D]
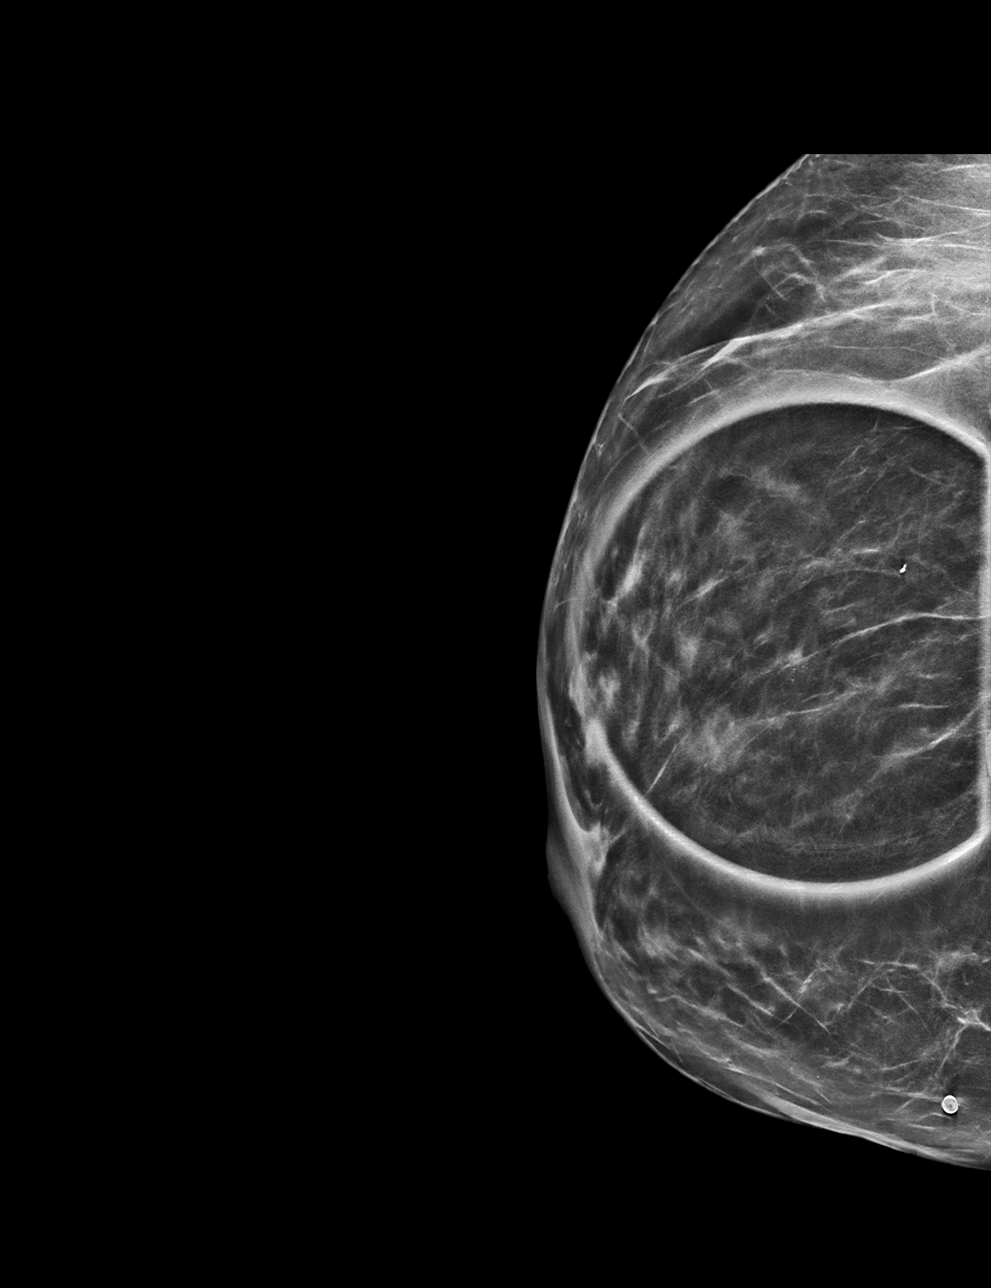

[R MLO tomo · tomo slice 33/66.0]
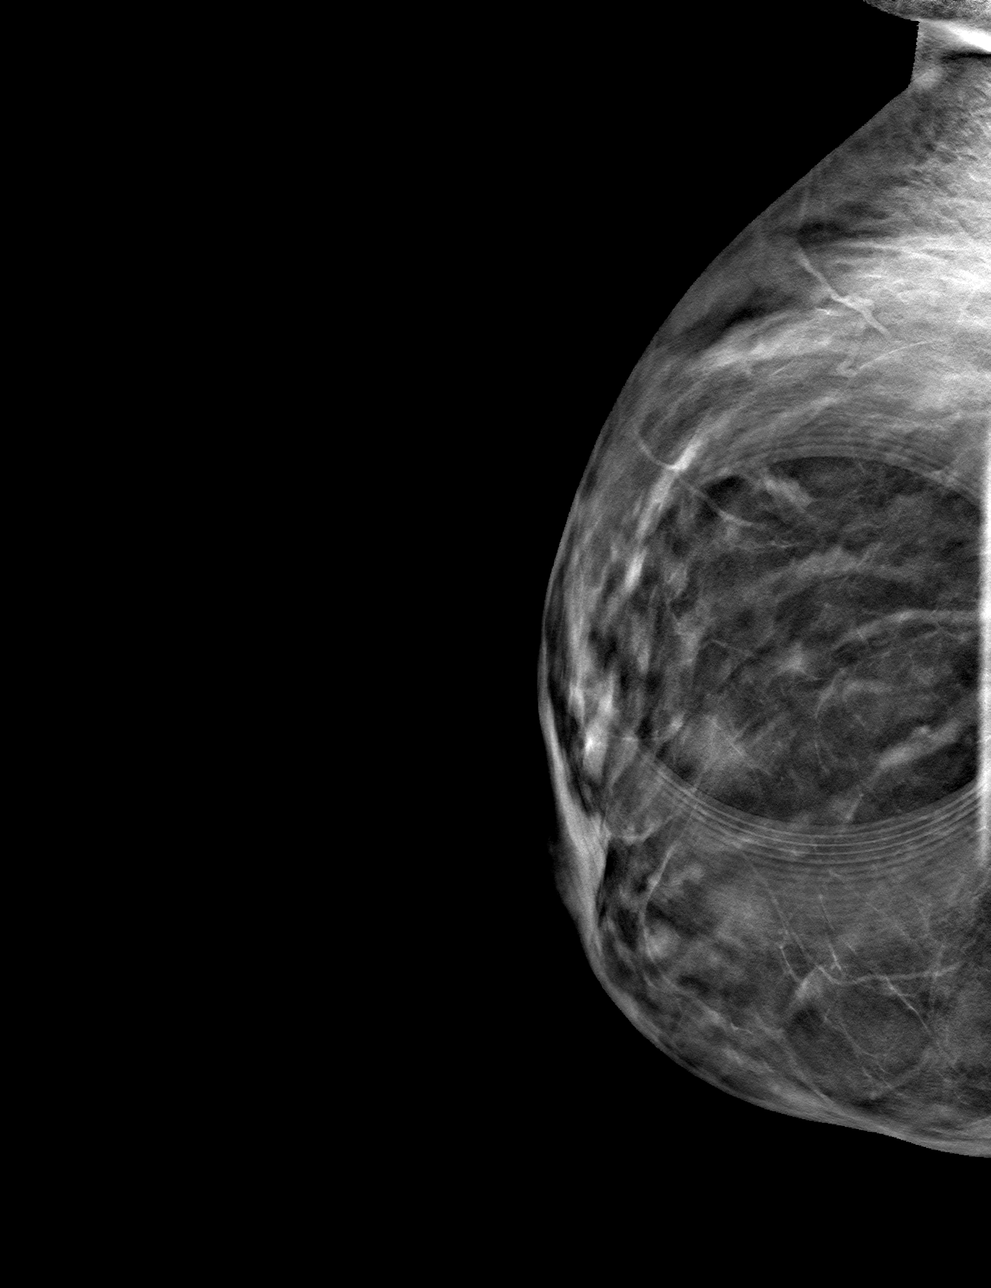

[R ML tomo · tomo slice 37/74.0]
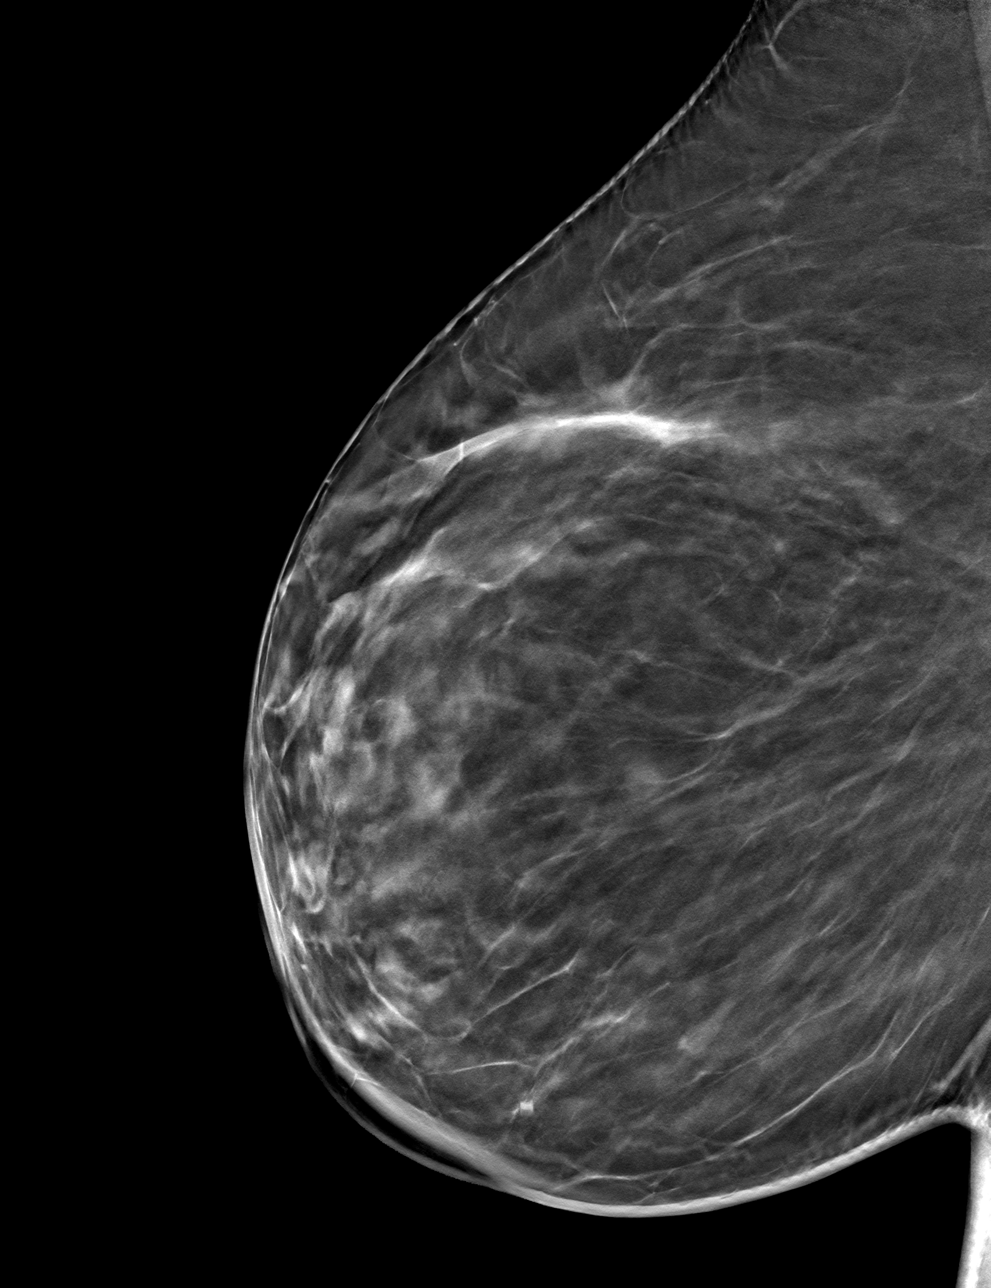

[4 of 12 positions shown; findings below may reference images not displayed]

ACR Breast Density Category b: There are scattered areas of
fibroglandular density.
FINDINGS: The mass in the right breast only seen on the MLO view located above
the level the nipple and relatively centrally persists on additional
imaging.

On physical exam, no suspicious lumps are identified.

Targeted ultrasound is performed, showing a mass in the right breast
at 12 o'clock, 2 cm from the nipple measuring 9 by 10 x 5 mm,
thought to correlate with the mammographic finding. No axillary
adenopathy.
IMPRESSION: Indeterminate right breast mass seen mammographically and
sonographically.

RECOMMENDATION:
Recommend ultrasound-guided biopsy of a right breast mass. Recommend
clip placement with follow-up mammography to ensure the mammographic
and sonographic findings correlate.

I have discussed the findings and recommendations with the patient.
Results were also provided in writing at the conclusion of the
visit. If applicable, a reminder letter will be sent to the patient
regarding the next appointment.

BI-RADS CATEGORY  4: Suspicious.

## 2020-01-01 IMAGING — MG MM CLIP PLACEMENT
4 series · 4 of 12 positions shown · non-contrast
Comparison: Previous exam(s).

CLINICAL DATA: Ultrasound-guided core needle biopsy was performed
of a mass in the 12 o'clock position of the right breast.

EXAM:
DIAGNOSTIC RIGHT MAMMOGRAM POST ULTRASOUND BIOPSY

[R CC synth-2D]
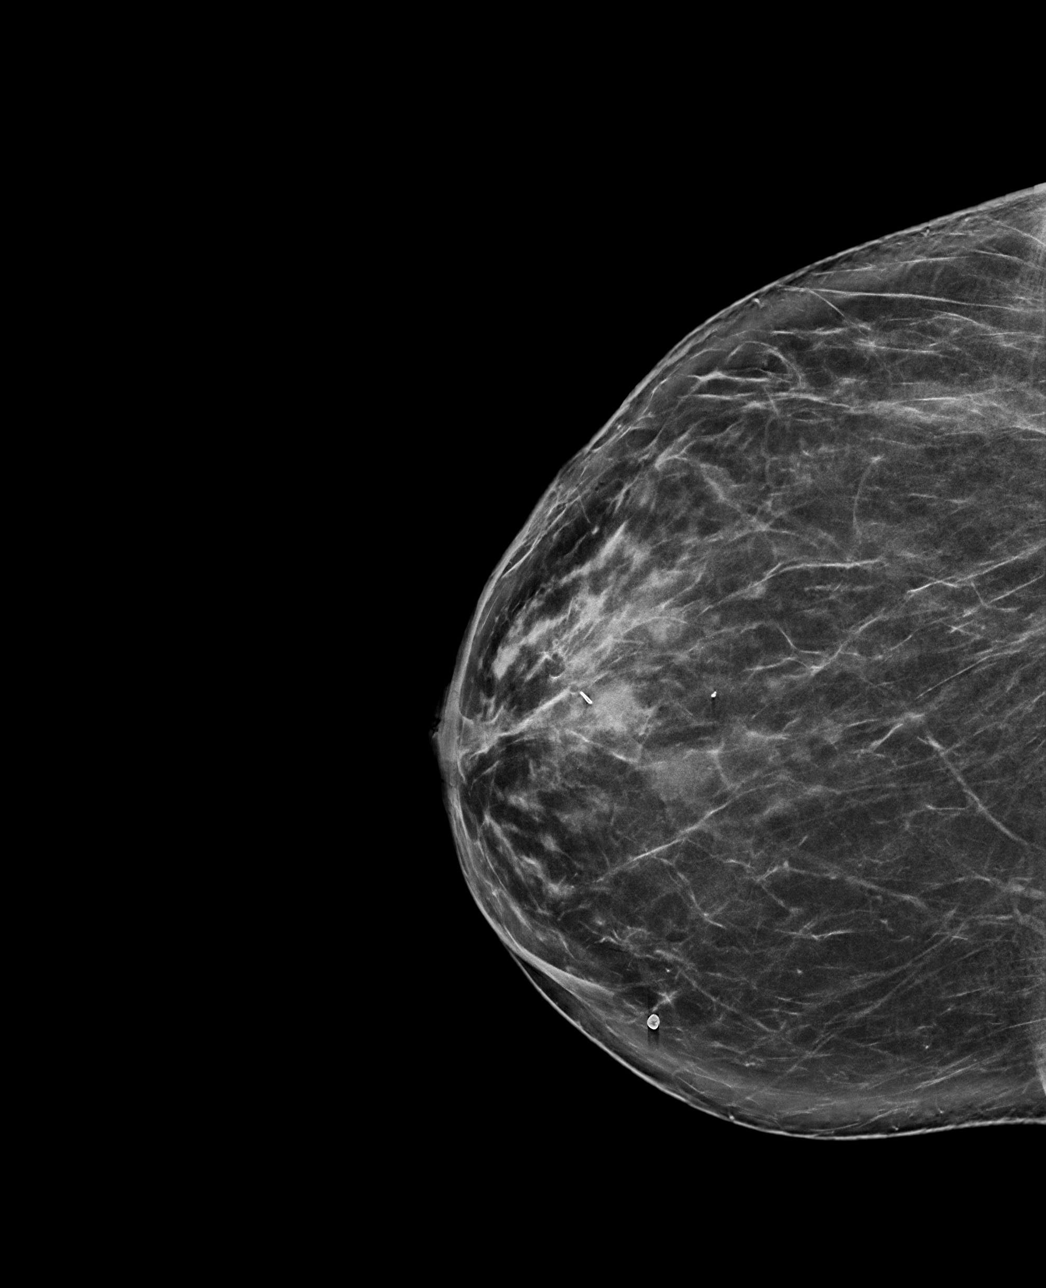

[R ML synth-2D]
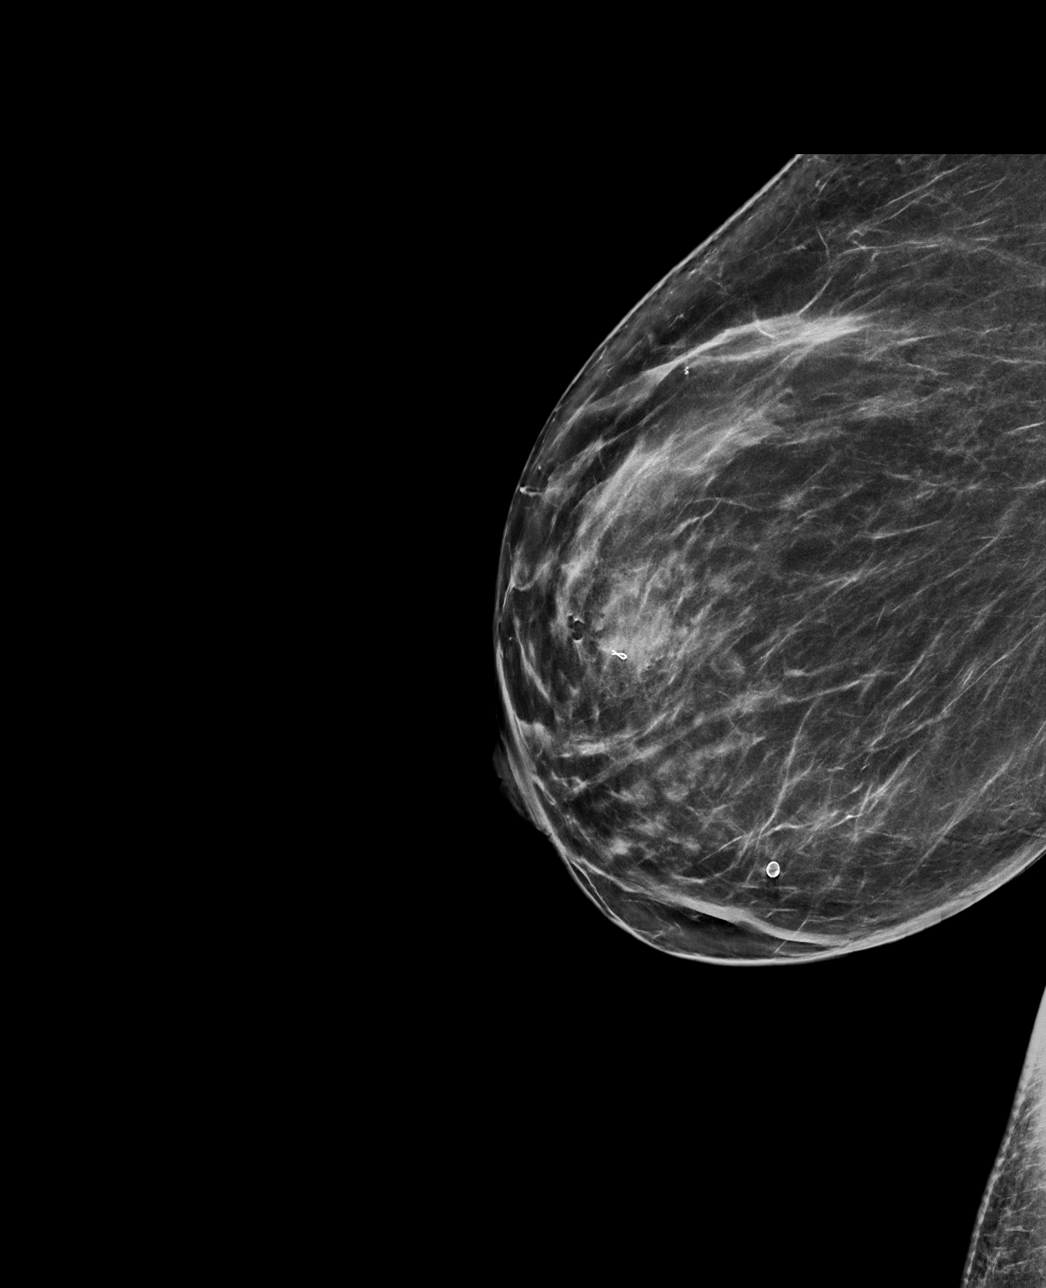

[R ML tomo · tomo slice 42/83.0]
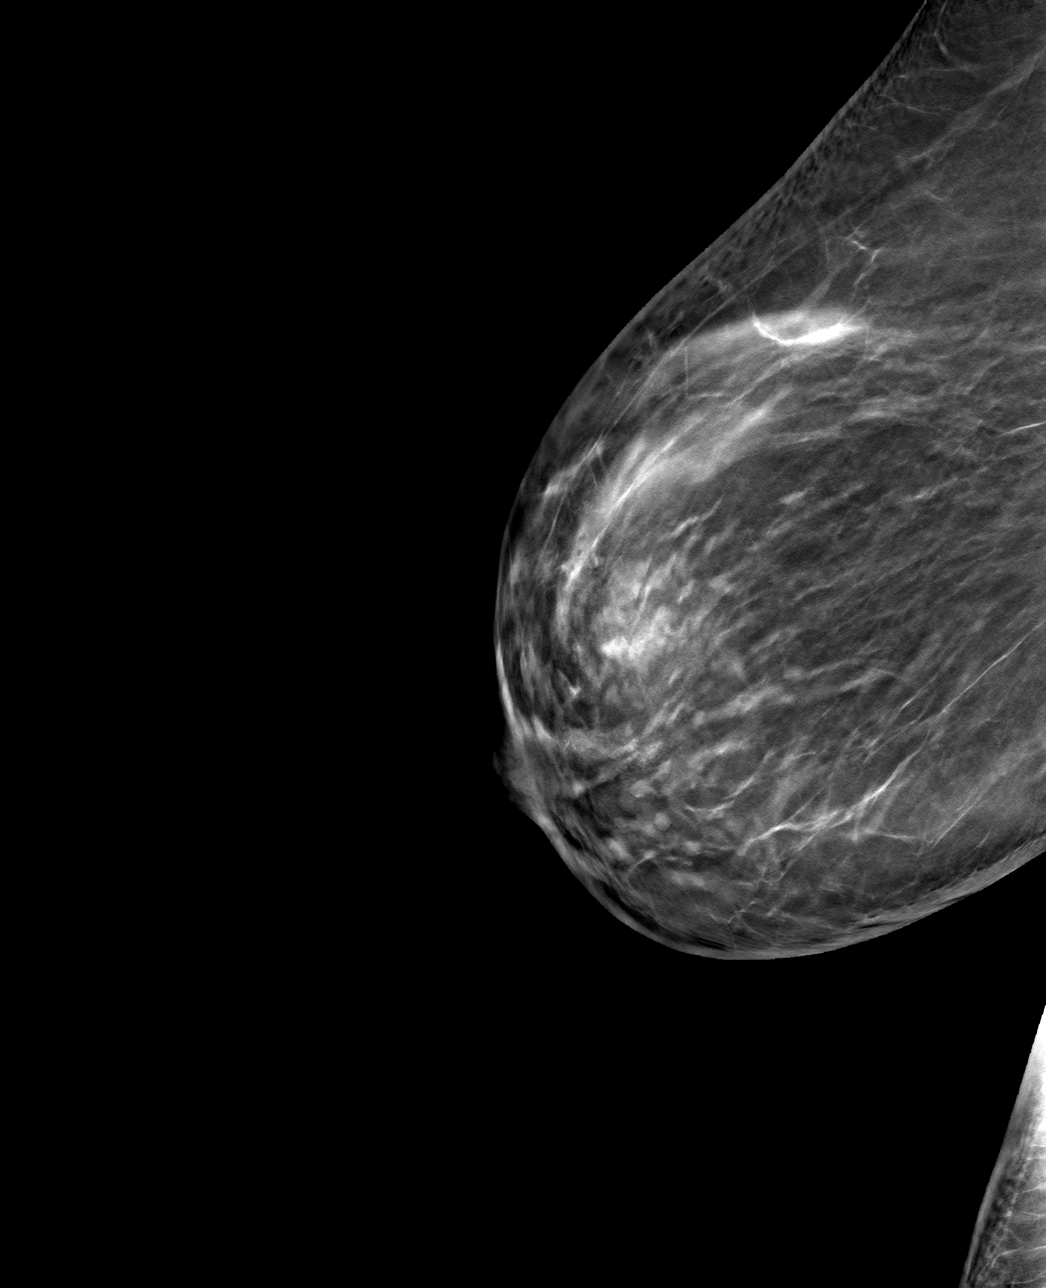

[R CC tomo · tomo slice 40/79.0]
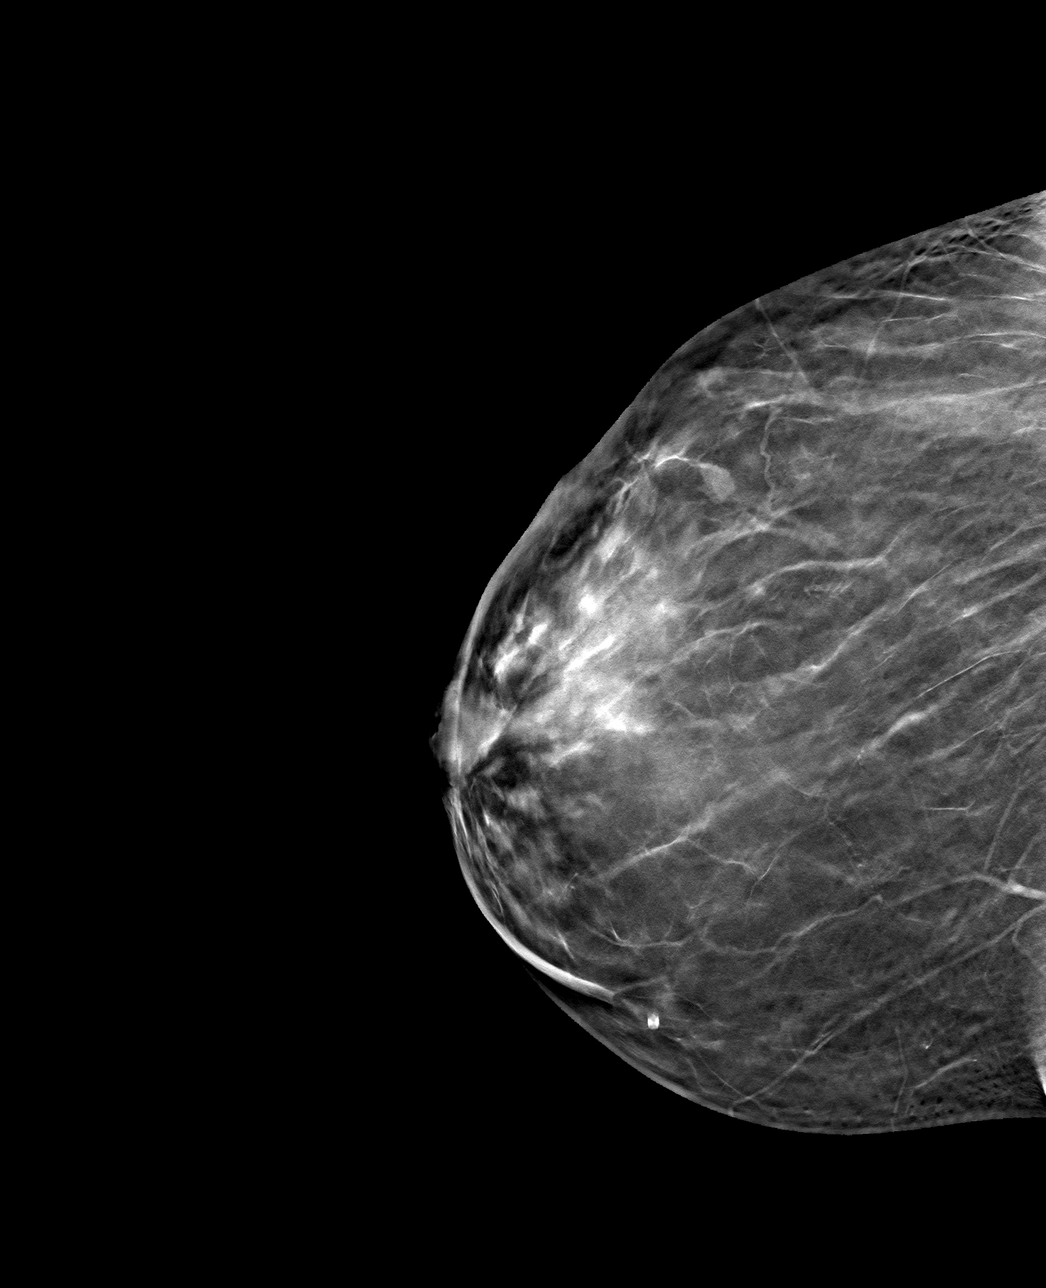

[4 of 12 positions shown; findings below may reference images not displayed]

FINDINGS: Mammographic images were obtained following ultrasound guided biopsy
of a right breast mass. Ribbon shaped biopsy clip is satisfactorily
positioned along the anterior margin of the mass.
IMPRESSION: Satisfactory position of ribbon shaped biopsy clip.

Final Assessment: Post Procedure Mammograms for Marker Placement

## 2020-01-04 DIAGNOSIS — M542 Cervicalgia: Secondary | ICD-10-CM | POA: Diagnosis not present

## 2020-01-04 DIAGNOSIS — M62830 Muscle spasm of back: Secondary | ICD-10-CM | POA: Diagnosis not present

## 2020-01-04 DIAGNOSIS — M546 Pain in thoracic spine: Secondary | ICD-10-CM | POA: Diagnosis not present

## 2020-01-08 ENCOUNTER — Ambulatory Visit (HOSPITAL_BASED_OUTPATIENT_CLINIC_OR_DEPARTMENT_OTHER): Payer: 59 | Attending: Family Medicine | Admitting: Internal Medicine

## 2020-01-08 ENCOUNTER — Other Ambulatory Visit: Payer: Self-pay

## 2020-01-08 DIAGNOSIS — R0683 Snoring: Secondary | ICD-10-CM | POA: Diagnosis not present

## 2020-01-14 DIAGNOSIS — R0683 Snoring: Secondary | ICD-10-CM | POA: Diagnosis not present

## 2020-01-14 NOTE — Procedures (Signed)
    Patient Name: Maria Nunez, Maria Nunez Date: 01/08/2020 Gender: Female D.O.B: [birth year] Age (years): 59 Referring Provider: Beatrice Lecher Height (inches): 23 Interpreting Physician: Baird Lyons MD, ABSM Weight (lbs): 156 RPSGT: Jacolyn Reedy BMI: 32 MRN: [id number] Neck Size: 15.00  CLINICAL INFORMATION Sleep Study Type: HST Indication for sleep study: Snoring Epworth Sleepiness Score: 3  SLEEP STUDY TECHNIQUE A multi-channel overnight portable sleep study was performed. The channels recorded were: nasal airflow, thoracic respiratory movement, and oxygen saturation with a pulse oximetry. Snoring was also monitored.  MEDICATIONS Patient self administered medications include: none reported.  SLEEP ARCHITECTURE Patient was studied for 383.7 minutes. The sleep efficiency was 100.0 % and the patient was supine for 81.5%. The arousal index was 0.0 per hour.  RESPIRATORY PARAMETERS The overall AHI was 25.6 per hour, with a central apnea index of 0.0 per hour. The oxygen nadir was 82% during sleep.  CARDIAC DATA Mean heart rate during sleep was 76.2 bpm.  IMPRESSIONS - Moderate obstructive sleep apnea occurred during this study (AHI = 25.6/h). - No significant central sleep apnea occurred during this study (CAI = 0.0/h). - Moderate oxygen desaturation was noted during this study (Min O2 = 82%). Mean O2 sat 92% - Patient snored.  DIAGNOSIS - Obstructive Sleep Apnea (G47.33)  RECOMMENDATIONS - Suggest CPAP titration sleep study or autopap. Other options would be based on clinical judgment. - Be careful with alcohol, sedatives and other CNS depressants that may worsen sleep apnea and disrupt normal sleep architecture. - Sleep hygiene should be reviewed to assess factors that may improve sleep quality. - Weight management and regular exercise should be initiated or continued.  [Electronically signed] 01/14/2020 11:39 AM  Baird Lyons MD, ABSM Diplomate,  American Board of Sleep Medicine   NPI: [id number]                        [facility]  ELECTRONICALLY SIGNED ON:  01/14/2020, 11:35 AM Ava PH: [phone]   FX: [phone] [place]

## 2020-01-16 ENCOUNTER — Encounter: Payer: Self-pay | Admitting: Family Medicine

## 2020-01-17 DIAGNOSIS — M542 Cervicalgia: Secondary | ICD-10-CM | POA: Diagnosis not present

## 2020-01-17 DIAGNOSIS — M546 Pain in thoracic spine: Secondary | ICD-10-CM | POA: Diagnosis not present

## 2020-01-17 DIAGNOSIS — M62830 Muscle spasm of back: Secondary | ICD-10-CM | POA: Diagnosis not present

## 2020-01-23 ENCOUNTER — Ambulatory Visit: Payer: 59 | Attending: Internal Medicine

## 2020-01-23 ENCOUNTER — Other Ambulatory Visit (HOSPITAL_BASED_OUTPATIENT_CLINIC_OR_DEPARTMENT_OTHER): Payer: Self-pay | Admitting: Internal Medicine

## 2020-01-23 DIAGNOSIS — Z23 Encounter for immunization: Secondary | ICD-10-CM

## 2020-01-23 MED FILL — PFIZER-BIONTECH COVID-19 VA: 30 | 21 days supply | Qty: 0 | Fill #0

## 2020-01-23 NOTE — Progress Notes (Signed)
   Covid-19 Vaccination Clinic  Name:  Maria Nunez    MRN: [id number] DOB: 01/02/1961  01/23/2020  Maria Nunez was observed post Covid-19 immunization for 15 minutes without incident. She was provided with Vaccine Information Sheet and instruction to access the V-Safe system.  Vaccinated by Fredirick Maudlin  Maria Nunez was instructed to call 911 with any severe reactions post vaccine: [name] Difficulty breathing  . Swelling of face and throat  . A fast heartbeat  . A bad rash all over body  . Dizziness and weakness   Immunizations Administered    Name Date Dose VIS Date Route   Pfizer COVID-19 Vaccine 01/23/2020 11:25 AM 0.3 mL 11/15/2019 Intramuscular   Manufacturer: [employer], [employer]   Lot: [id number]   NDC: [address]-2

## 2020-01-31 DIAGNOSIS — M62830 Muscle spasm of back: Secondary | ICD-10-CM | POA: Diagnosis not present

## 2020-01-31 DIAGNOSIS — M546 Pain in thoracic spine: Secondary | ICD-10-CM | POA: Diagnosis not present

## 2020-01-31 DIAGNOSIS — M542 Cervicalgia: Secondary | ICD-10-CM | POA: Diagnosis not present

## 2020-02-08 ENCOUNTER — Other Ambulatory Visit (HOSPITAL_BASED_OUTPATIENT_CLINIC_OR_DEPARTMENT_OTHER): Payer: Self-pay | Admitting: Family Medicine

## 2020-02-08 ENCOUNTER — Ambulatory Visit: Payer: 59 | Admitting: Physician Assistant

## 2020-02-08 ENCOUNTER — Telehealth (INDEPENDENT_AMBULATORY_CARE_PROVIDER_SITE_OTHER): Payer: 59 | Admitting: Family Medicine

## 2020-02-08 ENCOUNTER — Encounter: Payer: Self-pay | Admitting: Family Medicine

## 2020-02-08 DIAGNOSIS — E1169 Type 2 diabetes mellitus with other specified complication: Secondary | ICD-10-CM

## 2020-02-08 DIAGNOSIS — G4733 Obstructive sleep apnea (adult) (pediatric): Secondary | ICD-10-CM

## 2020-02-08 DIAGNOSIS — M791 Myalgia, unspecified site: Secondary | ICD-10-CM

## 2020-02-08 MED ORDER — AMBULATORY NON FORMULARY MEDICATION: 0 refills | Status: DC

## 2020-02-08 MED ORDER — METFORMIN HCL ER (MOD) 1000 MG PO TB24: 1000.0000 mg | ORAL_TABLET | Freq: Every day | ORAL | 0 refills | Status: DC

## 2020-02-08 MED FILL — LOSARTAN-HCTZ 100-25 MG TAB: 100-25 | 90 days supply | Qty: 90 | Fill #0

## 2020-02-08 MED FILL — ROSUVASTATIN CALCIUM 40 MG: 40 | 90 days supply | Qty: 90 | Fill #2

## 2020-02-08 MED FILL — METFORMIN HCL 1000 MG TABS: 1000 | 30 days supply | Qty: 30 | Fill #0

## 2020-02-08 MED FILL — FREESTYLE LITE TEST STRIP: 75 days supply | Qty: 150 | Fill #2

## 2020-02-08 NOTE — Assessment & Plan Note (Signed)
Discussed new diagnosis she does have a diagnosis consistent with moderate sleep apnea.  AHI of 25.6 no central apneas.  Discussed getting her supplies and setting up for AutoPap and then getting the download to set her pressure.  Discussed the goal of getting her to use it for at least 4 hours a day to reduce more long-term risk of having untreated sleep apnea

## 2020-02-08 NOTE — Progress Notes (Signed)
Virtual Visit via Video Note  I connected with Maria Nunez on 02/08/20 at  9:50 AM EST by a video enabled telemedicine application and verified that I am speaking with the correct person using two identifiers.   I discussed the limitations of evaluation and management by telemedicine and the availability of in person appointments. The patient expressed understanding and agreed to proceed.  Patient location: at home  Provider location: in office  Subjective:    CC: Cough  HPI: Pt got her Booster on 01/23/2020. She felt like sometime afterwards she began experiencing body aches and a dry cough that comes and goes. She is taking IBU for the body aches. Denies any f/s/c/n/v/d/sore throat. No nasal congestion.   She wanted Dr. Madilyn Fireman to know that a few weeks ago her BS were "bottoming out" after taking the Metformin 1,000 mg. She decided to cut the tablet in half and began to get constipated so she restarted the whole tablet and hasn't had anymore constipation.   Also this was her regular f/u visit and she was suppose to go over her home sleep study results.  He did have a sleep study test which showed an AHI of 25.6 consistent with moderate sleep apnea.  No central apneas.  Average oxygen levels around 92% but she did drop down as low as 82%  Past medical history, Surgical history, Family history not pertinant except as noted below, Social history, Allergies, and medications have been entered into the medical record, reviewed, and corrections made.   Review of Systems: No fevers, chills, night sweats, weight loss, chest pain, or shortness of breath.   Objective:    General: Speaking clearly in complete sentences without any shortness of breath.  Alert and oriented x3.  Normal judgment. No apparent acute distress.    Impression and Recommendations:    OSA (obstructive sleep apnea) Discussed new diagnosis she does have a diagnosis consistent with moderate sleep apnea.  AHI of  25.6 no central apneas.  Discussed getting her supplies and setting up for AutoPap and then getting the download to set her pressure.  Discussed the goal of getting her to use it for at least 4 hours a day to reduce more long-term risk of having untreated sleep apnea  Type 2 diabetes mellitus with other specified complication ([place]) She was having some hypoglycemia on the 1000 metformin twice daily but then when she went down to the 500 she started having more constipation as the metformin does help her with stool regularity.  We discussed options including switching to the metformin 850 mg which might be a good in between dose.  Versus switching to the 1000 ER version where she might have less potential for hypoglycemia but could still help with her bowels.  She would like to try the once a day 1000 ER dosing first.  And we can always make a change at the end of the month before refills are sent.   Myalgias-likely secondary to recent COVID booster does not sound like she has any underlying current viral or bacterial infection but certainly if she is not improving or if the cough worsens or continues then I would recommend that she get tested for COVID.  She has been working from home.  Order for CPAP supplies placed as well as new prescription for metformin sent to pharmacy.  I discussed the assessment and treatment plan with the patient. The patient was provided an opportunity to ask questions and all were answered. The patient agreed  with the plan and demonstrated an understanding of the instructions.   The patient was advised to call back or seek an in-person evaluation if the symptoms worsen or if the condition fails to improve as anticipated.   Beatrice Lecher, MD

## 2020-02-08 NOTE — Assessment & Plan Note (Signed)
She was having some hypoglycemia on the 1000 metformin twice daily but then when she went down to the 500 she started having more constipation as the metformin does help her with stool regularity.  We discussed options including switching to the metformin 850 mg which might be a good in between dose.  Versus switching to the 1000 ER version where she might have less potential for hypoglycemia but could still help with her bowels.  She would like to try the once a day 1000 ER dosing first.  And we can always make a change at the end of the month before refills are sent.

## 2020-02-08 NOTE — Progress Notes (Signed)
Pt got her Booster on 01/23/2020. She felt like sometime afterwards she began experiencing body aches and a dry cough that comes and goes. She is taking IBU for the body aches. Denies any f/s/c/n/v/d/sore throat.  She wanted Dr. Madilyn Fireman to know that a few weeks ago her BS were "bottoming out" after taking the Metformin 1,000 mg. She decided to cut the tablet in half and began to get constipated so she restarted the whole tablet and hasn't had anymore constipation.   Also this was her regular f/u visit and she was suppose to go over her home sleep study results.

## 2020-02-09 ENCOUNTER — Ambulatory Visit (INDEPENDENT_AMBULATORY_CARE_PROVIDER_SITE_OTHER): Payer: 59 | Admitting: Family Medicine

## 2020-02-09 DIAGNOSIS — R059 Cough, unspecified: Secondary | ICD-10-CM | POA: Diagnosis not present

## 2020-02-09 DIAGNOSIS — R52 Pain, unspecified: Secondary | ICD-10-CM

## 2020-02-12 NOTE — Progress Notes (Signed)
Came in for COVID swab.

## 2020-02-13 LAB — NOVEL CORONAVIRUS, NAA: SARS-CoV-2, NAA: NOT DETECTED

## 2020-02-20 DIAGNOSIS — M542 Cervicalgia: Secondary | ICD-10-CM | POA: Diagnosis not present

## 2020-02-20 DIAGNOSIS — M47816 Spondylosis without myelopathy or radiculopathy, lumbar region: Secondary | ICD-10-CM | POA: Diagnosis not present

## 2020-02-20 DIAGNOSIS — M62830 Muscle spasm of back: Secondary | ICD-10-CM | POA: Diagnosis not present

## 2020-02-20 DIAGNOSIS — M47813 Spondylosis without myelopathy or radiculopathy, cervicothoracic region: Secondary | ICD-10-CM | POA: Diagnosis not present

## 2020-02-21 ENCOUNTER — Encounter: Payer: Self-pay | Admitting: Family Medicine

## 2020-02-22 NOTE — Telephone Encounter (Signed)
Please check on her referral as below. Sounds like she hasn't heard from [facility].  Also her daughter can come out of quarantine if has been 14 days and sounds like her daughter needs an appt. See if she is willing to do an inperson or virtual.

## 2020-03-05 DIAGNOSIS — M47816 Spondylosis without myelopathy or radiculopathy, lumbar region: Secondary | ICD-10-CM | POA: Diagnosis not present

## 2020-03-05 DIAGNOSIS — M62830 Muscle spasm of back: Secondary | ICD-10-CM | POA: Diagnosis not present

## 2020-03-05 DIAGNOSIS — M542 Cervicalgia: Secondary | ICD-10-CM | POA: Diagnosis not present

## 2020-03-05 DIAGNOSIS — M47813 Spondylosis without myelopathy or radiculopathy, cervicothoracic region: Secondary | ICD-10-CM | POA: Diagnosis not present

## 2020-03-12 ENCOUNTER — Encounter: Payer: Self-pay | Admitting: Family Medicine

## 2020-03-12 ENCOUNTER — Other Ambulatory Visit: Payer: Self-pay | Admitting: Family Medicine

## 2020-03-12 DIAGNOSIS — E1169 Type 2 diabetes mellitus with other specified complication: Secondary | ICD-10-CM

## 2020-03-12 MED ORDER — METFORMIN HCL 1000 MG PO TABS: 1000.0000 mg | ORAL_TABLET | Freq: Two times a day (BID) | ORAL | 1 refills | Status: DC

## 2020-03-13 MED FILL — METFORMIN HCL 1000 MG TABS: 1000 | 90 days supply | Qty: 180 | Fill #0

## 2020-04-09 DIAGNOSIS — M542 Cervicalgia: Secondary | ICD-10-CM | POA: Diagnosis not present

## 2020-04-09 DIAGNOSIS — M47816 Spondylosis without myelopathy or radiculopathy, lumbar region: Secondary | ICD-10-CM | POA: Diagnosis not present

## 2020-04-09 DIAGNOSIS — M62830 Muscle spasm of back: Secondary | ICD-10-CM | POA: Diagnosis not present

## 2020-04-09 DIAGNOSIS — M47813 Spondylosis without myelopathy or radiculopathy, cervicothoracic region: Secondary | ICD-10-CM | POA: Diagnosis not present

## 2020-04-15 ENCOUNTER — Ambulatory Visit: Payer: 59 | Admitting: Family Medicine

## 2020-04-18 ENCOUNTER — Ambulatory Visit: Payer: 59 | Admitting: Family Medicine

## 2020-04-18 ENCOUNTER — Encounter: Payer: Self-pay | Admitting: Family Medicine

## 2020-04-18 ENCOUNTER — Other Ambulatory Visit: Payer: Self-pay

## 2020-04-18 ENCOUNTER — Other Ambulatory Visit: Payer: Self-pay | Admitting: Family Medicine

## 2020-04-18 VITALS — BP 118/66 | HR 87 | Ht 60.0 in | Wt 160.0 lb

## 2020-04-18 DIAGNOSIS — I10 Essential (primary) hypertension: Secondary | ICD-10-CM | POA: Diagnosis not present

## 2020-04-18 DIAGNOSIS — R630 Anorexia: Secondary | ICD-10-CM | POA: Diagnosis not present

## 2020-04-18 DIAGNOSIS — R7989 Other specified abnormal findings of blood chemistry: Secondary | ICD-10-CM | POA: Diagnosis not present

## 2020-04-18 DIAGNOSIS — E1169 Type 2 diabetes mellitus with other specified complication: Secondary | ICD-10-CM

## 2020-04-18 LAB — BASIC METABOLIC PANEL WITH GFR
BUN/Creatinine Ratio: 24 (calc) — ABNORMAL HIGH (ref 6–22)
BUN: 39 mg/dL — ABNORMAL HIGH (ref 7–25)
CO2: 30 mmol/L (ref 20–32)
Calcium: 9.6 mg/dL (ref 8.6–10.4)
Chloride: 99 mmol/L (ref 98–110)
Creat: 1.63 mg/dL — ABNORMAL HIGH (ref 0.50–1.05)
GFR, Est African American: 40 mL/min/{1.73_m2} — ABNORMAL LOW (ref >=60)
GFR, Est Non African American: 34 mL/min/{1.73_m2} — ABNORMAL LOW (ref >=60)
Glucose, Bld: 125 mg/dL — ABNORMAL HIGH (ref 65–99)
Potassium: 3.4 mmol/L — ABNORMAL LOW (ref 3.5–5.3)
Sodium: 141 mmol/L (ref 135–146)

## 2020-04-18 LAB — POCT GLYCOSYLATED HEMOGLOBIN (HGB A1C): Hemoglobin A1C: 7 % — AB (ref 4.0–5.6)

## 2020-04-18 MED ORDER — LINAGLIPTIN 5 MG PO TABS: 5.0000 mg | ORAL_TABLET | Freq: Every day | ORAL | 2 refills | Status: DC

## 2020-04-18 NOTE — Assessment & Plan Note (Signed)
Well controlled. Continue current regimen. Follow up in  3 mo .  

## 2020-04-18 NOTE — Progress Notes (Signed)
Established Patient Office Visit  Subjective:  Patient ID: Maria Nunez, female    DOB: 1960-10-08  Age: 60 y.o. MRN: [id number]  CC:  Chief Complaint  Patient presents with  . Diabetes  . Hypertension    HPI Maria Nunez presents for   Hypertension- Pt denies chest pain, SOB, dizziness, or heart palpitations.  Taking meds as directed w/o problems.  Denies medication side effects.  She reports home blood pressures have been running really low in fact less than 110.  So she has had some days where she is checked her pressure and then skipped her medication because her pressure was low.  Diabetes - no hypoglycemic events. No wounds or sores that are not healing well. No increased thirst or urination. Checking glucose at home. Taking medications as prescribed without any side effects.  Has had a dec appetitie over the last 3 months.  Has lost 3 lbs since last here, 5 months ago.   He has been under some more stress recently.  She wonders if that could even be contributing to the appetite suppression.  Past Medical History:  Diagnosis Date  . Abnormal ultrasound of ovary 2016   Ultrasound showing bilateral ovaries in contact with one another.  Asymptomatic.  [name] Allergy   . Diabetes mellitus    borderline  . Fibroid   . Heart murmur   . Hidradenitis   . High cholesterol   . Hypertension     Past Surgical History:  Procedure Laterality Date  . ABDOMINAL HYSTERECTOMY     supracervical  . BREAST EXCISIONAL BIOPSY Right   . BREAST SURGERY     cysts removed right breast  . BREAST SURGERY  08/2018   benign mass  . CARPAL TUNNEL RELEASE Right 01/08/2017   Procedure: CARPAL TUNNEL RELEASE, POSSIBLE RIGHT THUMB CARPOMETACARPAL INJECTION;  Surgeon: Roseanne Kaufman, MD;  Location: [facility];  Service: Orthopedics;  Laterality: Right;  60 MINS  . CESAREAN SECTION    . INGUINAL HIDRADENITIS EXCISION    . LEEP  05/04/13   HPV change    Family  History  Problem Relation Age of Onset  . Asthma Mother   . COPD Mother   . Diabetes Mother   . Hypertension Mother   . Diabetes Father   . Hypertension Father   . Breast cancer Maternal Aunt   . Breast cancer Paternal Aunt     Social History   Socioeconomic History  . Marital status: Divorced    Spouse name: Not on file  . Number of children: Not on file  . Years of education: Not on file  . Highest education level: Not on file  Occupational History  . Not on file  Tobacco Use  . Smoking status: Never Smoker  . Smokeless tobacco: Never Used  Vaping Use  . Vaping Use: Never used  Substance and Sexual Activity  . Alcohol use: No    Alcohol/week: 0.0 standard drinks  . Drug use: No  . Sexual activity: Not Currently    Partners: Male    Birth control/protection: Surgical    Comment: TAH/supracervical  Other Topics Concern  . Not on file  Social History Narrative  . Not on file   Social Determinants of Health   Financial Resource Strain: Not on file  Food Insecurity: Not on file  Transportation Needs: Not on file  Physical Activity: Not on file  Stress: Not on file  Social Connections: Not on file  Intimate Partner  Violence: Not on file    Outpatient Medications Prior to Visit  Medication Sig Dispense Refill  . AMBULATORY NON FORMULARY MEDICATION Medication Name: CPAP with humidifier and supplies. Set Autopap 4-10 cm water pressure.  Moderate OSA with AHI of 25. 1 Units 0  . Ascorbic Acid (VITAMIN C) 1000 MG tablet Take 1 tablet by mouth daily.    [name] aspirin 81 MG tablet Take 81 mg by mouth daily.    . Blood Glucose Monitoring Suppl (FREESTYLE LITE) DEVI     . celecoxib (CELEBREX) 200 MG capsule One to 2 tablets by mouth daily as needed for pain. 60 capsule 2  . Cyanocobalamin (B-12) 500 MCG TABS Take 1 tablet by mouth daily.    . fluticasone (FLONASE) 50 MCG/ACT nasal spray Place 2 sprays into both nostrils daily. 16 g 6  . FOLIC ACID PO Take 782 mcg by mouth  daily.    [name] glucose blood (FREESTYLE LITE) test strip For use when testing blood sugars twice daily. DX:E11.69 400 each 12  . Lancets (FREESTYLE) lancets For use when testing blood sugars daily. DX:E11.69 200 each 12  . losartan-hydrochlorothiazide (HYZAAR) 100-25 MG tablet TAKE 1 TABLET BY MOUTH DAILY. 90 tablet 1  . metFORMIN (GLUCOPHAGE) 1000 MG tablet Take 1 tablet (1,000 mg total) by mouth 2 (two) times daily with a meal. 180 tablet 1  . Multiple Vitamin (MULTIVITAMIN) tablet Take 1 tablet by mouth daily.    . Omega-3 Fatty Acids (FISH OIL) 1000 MG CAPS Take 1,000 mg by mouth daily.     [name] pyridOXINE (VITAMIN B-6) 100 MG tablet Take 100 mg by mouth daily.    . rosuvastatin (CRESTOR) 40 MG tablet Take 1 tablet (40 mg total) by mouth daily. 90 tablet 3  . Zinc Sulfate (ZINC 15 PO) Take by mouth.    [name] GLUCOSE BLOOD VI USE TO TEST BLOOD SUGARS TWICE DAILY     No facility-administered medications prior to visit.    Allergies  Allergen Reactions  . Sulfonamide Derivatives Hives    REACTION: hives  . Xigduo Xr [Dapagliflozin-Metformin Hcl Er] Hives  . Hycodan [Hydrocodone-Homatropine] Itching  . Cefaclor Rash  . Cephalexin Rash  . Januvia [Sitagliptin] Nausea Only    Back pain  . Levofloxacin Itching and Rash  . Penicillins Other (See Comments)    unknown  . Tetracycline Nausea Only    GI upset  . Victoza [Liraglutide] Other (See Comments)    Back pain    ROS Review of Systems    Objective:    Physical Exam Constitutional:      Appearance: She is well-developed.  HENT:     Head: Normocephalic and atraumatic.  Cardiovascular:     Rate and Rhythm: Normal rate and regular rhythm.     Heart sounds: Normal heart sounds.  Pulmonary:     Effort: Pulmonary effort is normal.     Breath sounds: Normal breath sounds.  Skin:    General: Skin is warm and dry.  Neurological:     Mental Status: She is alert and oriented to person, place, and time.  Psychiatric:        Behavior:  Behavior normal.     BP 118/66   Pulse 87   Ht 5' (1.524 m)   Wt 160 lb (72.6 kg)   LMP 01/27/2000 (Approximate)   SpO2 98%   BMI 31.25 kg/m  Wt Readings from Last 3 Encounters:  04/18/20 160 lb (72.6 kg)  11/08/19 163 lb (73.9 kg)  08/07/19 167 lb (75.8 kg)     There are no preventive care reminders to display for this patient.  There are no preventive care reminders to display for this patient.  Lab Results  Component Value Date   TSH 0.96 11/25/2017   Lab Results  Component Value Date   WBC 6.5 08/07/2019   HGB 13.2 08/07/2019   HCT 39.7 08/07/2019   MCV 88.6 08/07/2019   PLT 285 08/07/2019   Lab Results  Component Value Date   NA 139 08/07/2019   K 3.8 08/07/2019   CO2 27 08/07/2019   GLUCOSE 148 (H) 08/07/2019   BUN 15 08/07/2019   CREATININE 0.58 08/07/2019   BILITOT 0.4 08/07/2019   ALKPHOS 76 10/29/2015   AST 27 08/07/2019   ALT 49 (H) 08/07/2019   PROT 6.7 08/07/2019   ALBUMIN 4.4 10/29/2015   CALCIUM 9.9 08/07/2019   ANIONGAP 13 01/13/2019   Lab Results  Component Value Date   CHOL 191 08/07/2019   Lab Results  Component Value Date   HDL 44 (L) 08/07/2019   Lab Results  Component Value Date   LDLCALC 122 (H) 08/07/2019   Lab Results  Component Value Date   TRIG 140 08/07/2019   Lab Results  Component Value Date   CHOLHDL 4.3 08/07/2019   Lab Results  Component Value Date   HGBA1C 7.0 (A) 04/18/2020      Assessment & Plan:   Problem List Items Addressed This Visit      Cardiovascular and Mediastinum   HYPERTENSION, BENIGN SYSTEMIC    Well controlled. Continue current regimen. Follow up in  3 mo       Relevant Orders   BASIC METABOLIC PANEL WITH GFR     Endocrine   Type 2 diabetes mellitus with other specified complication (HCC) - Primary    A1c is up to 7.8, previous was 6.8.  Currently on Metformin. We discussed adding another medication.  She has had several intolerance so will need to monitor carefully for side  effects.  I would like to try to put her on Tradjenta.  This would hopefully lower her A1c by about 0.5.  Continue to work on [employer] and staying active.      Relevant Medications   linagliptin (TRADJENTA) 5 MG TABS tablet   Other Relevant Orders   POCT glycosylated hemoglobin (Hb A1C) (Completed)   BASIC METABOLIC PANEL WITH GFR    Other Visit Diagnoses    Decreased appetite         Fall Risk  07/07/2019 10/04/2018 08/08/2018 05/21/2014  Falls in the past year? 0 0 0 No  Number falls in past yr: - 0 0 -  Injury with Fall? - 0 0 -  Risk for fall due to : No Fall Risks - - -   Decreased appetite-she has lost about 3 pounds since November though she says it is a little bit more on her home scale we will definitely keep an eye on this but again she is been under a lot of stress and feels like that is likely contributing she is not having any abdominal pain or blood in the stool or other red flag symptoms for weight loss.  She is up-to-date on her colonoscopy and we will get that abstracted.  If she start noticing any new symptoms then please let us know immediately  Meds ordered this encounter  Medications  . linagliptin (TRADJENTA) 5 MG TABS tablet    Sig: Take  1 tablet (5 mg total) by mouth daily.    Dispense:  30 tablet    Refill:  2    Follow-up: Return in about 3 months (around 07/19/2020) for Diabetes follow-up.    Beatrice Lecher, MD

## 2020-04-18 NOTE — Assessment & Plan Note (Addendum)
A1c is up to 7.8, previous was 6.8.  Currently on Metformin. We discussed adding another medication.  She has had several intolerance so will need to monitor carefully for side effects.  I would like to try to put her on Tradjenta.  This would hopefully lower her A1c by about 0.5.  Continue to work on [employer] and staying active.

## 2020-04-19 ENCOUNTER — Other Ambulatory Visit: Payer: Self-pay | Admitting: *Deleted

## 2020-04-22 ENCOUNTER — Telehealth: Payer: Self-pay | Admitting: *Deleted

## 2020-04-22 ENCOUNTER — Encounter: Payer: Self-pay | Admitting: Family Medicine

## 2020-04-22 ENCOUNTER — Ambulatory Visit: Payer: 59 | Admitting: Physician Assistant

## 2020-04-22 ENCOUNTER — Other Ambulatory Visit: Payer: Self-pay | Admitting: Physician Assistant

## 2020-04-22 ENCOUNTER — Other Ambulatory Visit: Payer: Self-pay

## 2020-04-22 ENCOUNTER — Encounter: Payer: Self-pay | Admitting: Physician Assistant

## 2020-04-22 VITALS — BP 141/89 | HR 85 | Ht 60.0 in | Wt 158.0 lb

## 2020-04-22 DIAGNOSIS — I10 Essential (primary) hypertension: Secondary | ICD-10-CM

## 2020-04-22 DIAGNOSIS — R630 Anorexia: Secondary | ICD-10-CM | POA: Diagnosis not present

## 2020-04-22 DIAGNOSIS — E1169 Type 2 diabetes mellitus with other specified complication: Secondary | ICD-10-CM

## 2020-04-22 DIAGNOSIS — R112 Nausea with vomiting, unspecified: Secondary | ICD-10-CM

## 2020-04-22 DIAGNOSIS — G4733 Obstructive sleep apnea (adult) (pediatric): Secondary | ICD-10-CM

## 2020-04-22 DIAGNOSIS — R1110 Vomiting, unspecified: Secondary | ICD-10-CM | POA: Insufficient documentation

## 2020-04-22 MED ORDER — METOCLOPRAMIDE HCL 10 MG PO TABS: 5.0000 mg | ORAL_TABLET | Freq: Three times a day (TID) | ORAL | 0 refills | Status: DC | PRN

## 2020-04-22 MED ORDER — OMEPRAZOLE 40 MG PO CPDR: 40.0000 mg | DELAYED_RELEASE_CAPSULE | Freq: Every day | ORAL | 1 refills | Status: DC

## 2020-04-22 MED FILL — OMEPRAZOLE 40 MG CPDR: 40 | 30 days supply | Qty: 30 | Fill #0

## 2020-04-22 MED FILL — METOCLOPRAMIDE 10 MG TABLET: 10 | 20 days supply | Qty: 30 | Fill #0

## 2020-04-22 NOTE — Patient Instructions (Addendum)
Cut hyzaar in half and keep checking BP.    Hiatal Hernia  A hiatal hernia occurs when part of the stomach slides above the muscle that separates the abdomen from the chest (diaphragm). A person can be born with a hiatal hernia (congenital), or it may develop over time. In almost all cases of hiatal hernia, only the top part of the stomach pushes through the diaphragm. Many people have a hiatal hernia with no symptoms. The larger the hernia, the more likely it is that you will have symptoms. In some cases, a hiatal hernia allows stomach acid to flow back into the tube that carries food from your mouth to your stomach (esophagus). This may cause heartburn symptoms. Severe heartburn symptoms may mean that you have developed a condition called gastroesophageal reflux disease (GERD). What are the causes? This condition is caused by a weakness in the opening (hiatus) where the esophagus passes through the diaphragm to attach to the upper part of the stomach. A person may be born with a weakness in the hiatus, or a weakness can develop over time. What increases the risk? This condition is more likely to develop in:  Older people. Age is a major risk factor for a hiatal hernia, especially if you are over the age of 55.  Pregnant women.  People who are overweight.  People who have frequent constipation. What are the signs or symptoms? Symptoms of this condition usually develop in the form of GERD symptoms. Symptoms include:  Heartburn.  Belching.  Indigestion.  Trouble swallowing.  Coughing or wheezing.  Sore throat.  Hoarseness.  Chest pain.  Nausea and vomiting. How is this diagnosed? This condition may be diagnosed during testing for GERD. Tests that may be done include:  X-rays of your stomach or chest.  An upper gastrointestinal (GI) series. This is an X-ray exam of your GI tract that is taken after you swallow a chalky liquid that shows up clearly on the X-ray.  Endoscopy.  This is a procedure to look into your stomach using a thin, flexible tube that has a tiny camera and light on the end of it. How is this treated? This condition may be treated by:  Dietary and lifestyle changes to help reduce GERD symptoms.  Medicines. These may include: ? Over-the-counter antacids. ? Medicines that make your stomach empty more quickly. ? Medicines that block the production of stomach acid (H2 blockers). ? Stronger medicines to reduce stomach acid (proton pump inhibitors).  Surgery to repair the hernia, if other treatments are not helping. If you have no symptoms, you may not need treatment. Follow these instructions at home: Lifestyle and activity  Do not use any products that contain nicotine or tobacco, such as cigarettes and e-cigarettes. If you need help quitting, ask your health care provider.  Try to achieve and maintain a healthy body weight.  Avoid putting pressure on your abdomen. Anything that puts pressure on your abdomen increases the amount of acid that may be pushed up into your esophagus. ? Avoid bending over, especially after eating. ? Raise the head of your bed by putting blocks under the legs. This keeps your head and esophagus higher than your stomach. ? Do not wear tight clothing around your chest or stomach. ? Try not to strain when having a bowel movement, when urinating, or when lifting heavy objects. Eating and drinking  Avoid foods that can worsen GERD symptoms. These may include: ? Fatty foods, like fried foods. ? Citrus fruits, like oranges  or lemon. ? Other foods and drinks that contain acid, like orange juice or tomatoes. ? Spicy food. ? Chocolate.  Eat frequent small meals instead of three large meals a day. This helps prevent your stomach from getting too full. ? Eat slowly. ? Do not lie down right after eating. ? Do not eat 1-2 hours before bed.  Do not drink beverages with caffeine. These include cola, coffee, cocoa, and  tea.  Do not drink alcohol. General instructions  Take over-the-counter and prescription medicines only as told by your health care provider.  Keep all follow-up visits as told by your health care provider. This is important. Contact a health care provider if:  Your symptoms are not controlled with medicines or lifestyle changes.  You are having trouble swallowing.  You have coughing or wheezing that will not go away. Get help right away if:  Your pain is getting worse.  Your pain spreads to your arms, neck, jaw, teeth, or back.  You have shortness of breath.  You sweat for no reason.  You feel sick to your stomach (nauseous) or you vomit.  You vomit blood.  You have bright red blood in your stools.  You have black, tarry stools. This information is not intended to replace advice given to you by your health care provider. Make sure you discuss any questions you have with your health care provider. Document Revised: 12/25/2016 Document Reviewed: 08/17/2016 Elsevier Patient Education  [place].

## 2020-04-22 NOTE — Progress Notes (Signed)
Subjective:    Patient ID: Maria Nunez, female    DOB: 1960/01/29, 60 y.o.   MRN: [id number]  HPI  Patient is a 60 year old obese female with HTN, T2DM who presents to the clinic with nausea/vomiting/loss of appetite for the last 2 months.  She has had no medication changes. Her sugar was elevated at last follow up and trajenta was added but not started yet. She will go to smell food and be nauseated but go to eat food and at times vomit. No particular food makes better or worse. Denies any constipation, diarrhea, melena, hematochezia, abdominal pain, urinary symptoms. zofran does help some.   .. Active Ambulatory Problems    Diagnosis Date Noted  . Type 2 diabetes mellitus with other specified complication ([place]) 40/98/1191  . Hyperlipidemia associated with type 2 diabetes mellitus ([place]) 11/03/2005  . PANIC ATTACK 09/07/2008  . RESTLESS LEG SYNDROME 04/02/2008  . HYPERTENSION, BENIGN SYSTEMIC 11/03/2005  . HOT FLASHES 08/04/2006  . Primary osteoarthritis of left knee 01/02/2008  . POLYARTHRITIS 04/02/2008  . LGSIL (low grade squamous intraepithelial dysplasia) 06/16/2012  . Carpal tunnel syndrome, bilateral 04/05/2013  . Localized primary osteoarthritis of carpometacarpal joint of right thumb 03/15/2015  . GAD (generalized anxiety disorder) 04/02/2015  . Bursitis of left shoulder 01/24/2016  . Pain in right hand 02/21/2018  . Plantar fasciitis, right 09/19/2019  . OSA (obstructive sleep apnea) 02/08/2020  . Decreased appetite 04/22/2020  . Non-intractable vomiting 04/22/2020   Resolved Ambulatory Problems    Diagnosis Date Noted  . HYPERTENSION 04/27/2008  . Plantar fasciosis 11/26/2011  . Tennis elbow 03/24/2013  . Left lateral epicondylitis 04/05/2013  . Skin abscess 06/22/2013  . Skin lesion of left arm 09/11/2013  . Chalazion left upper eyelid 04/13/2019  . Snoring 11/09/2019   Past Medical History:  Diagnosis Date  . Abnormal ultrasound of ovary 2016  .  Allergy   . Diabetes mellitus   . Fibroid   . Heart murmur   . Hidradenitis   . High cholesterol   . Hypertension      Review of Systems See HPI.     Objective:   Physical Exam Vitals reviewed.  Constitutional:      Appearance: Normal appearance. She is obese.  Cardiovascular:     Rate and Rhythm: Normal rate and regular rhythm.     Pulses: Normal pulses.  Pulmonary:     Effort: Pulmonary effort is normal.  Abdominal:     General: Bowel sounds are normal. There is no distension.     Palpations: Abdomen is soft. There is no mass.     Tenderness: There is no abdominal tenderness. There is no right CVA tenderness, left CVA tenderness or guarding.  Neurological:     General: No focal deficit present.     Mental Status: She is alert and oriented to person, place, and time.  Psychiatric:        Mood and Affect: Mood normal.           Assessment & Plan:  Marland KitchenMarland KitchenDiagnoses and all orders for this visit:  HYPERTENSION, BENIGN SYSTEMIC  Type 2 diabetes mellitus with other specified complication, without long-term current use of insulin ([facility])  Decreased appetite -     omeprazole (PRILOSEC) 40 MG capsule; Take 1 capsule (40 mg total) by mouth daily. -     metoCLOPramide (REGLAN) 10 MG tablet; Take 0.5 tablets (5 mg total) by mouth every 8 (eight) hours as needed for nausea (nausea/reflux).  Non-intractable vomiting  with nausea, unspecified vomiting type -     omeprazole (PRILOSEC) 40 MG capsule; Take 1 capsule (40 mg total) by mouth daily. -     metoCLOPramide (REGLAN) 10 MG tablet; Take 0.5 tablets (5 mg total) by mouth every 8 (eight) hours as needed for nausea (nausea/reflux).   BP not to goal today of under 130/90. Discussed her readings at home were not too low but if feeling dizzy then cut hyzaar in half and start checking BP. Follow up PCP in 1 week.   Recheck cmp on Thursday due to increased serum creatinine.   Unclear etiology of nausea and vomiting. Recent increase  in glucose ? Gastroparesis although random sugars were just 140-150s? Hiatal hernia. No reflux symptoms. HO given. Given reglan. Discussed small meals. Start omeprazole daily. No new medication changes. Colonoscopy UTD due 2025. Follow up PCP in 1-2 weeks.

## 2020-04-22 NOTE — Telephone Encounter (Signed)
Spoke w/Gina she stated that on 03/19/2020 someone had spoken w/pt and she was told that there was a shortage and that she shortage is a [employer] SHORTAGE.   Pt advised of this. Told her that I would try another DME supplier unsure if she will get this any faster.

## 2020-04-24 ENCOUNTER — Ambulatory Visit: Payer: 59 | Admitting: Family Medicine

## 2020-04-24 ENCOUNTER — Ambulatory Visit: Payer: 59

## 2020-04-24 DIAGNOSIS — E1169 Type 2 diabetes mellitus with other specified complication: Secondary | ICD-10-CM | POA: Diagnosis not present

## 2020-04-24 DIAGNOSIS — I10 Essential (primary) hypertension: Secondary | ICD-10-CM | POA: Diagnosis not present

## 2020-04-24 LAB — BASIC METABOLIC PANEL WITH GFR
BUN/Creatinine Ratio: 16 (calc) (ref 6–22)
BUN: 33 mg/dL — ABNORMAL HIGH (ref 7–25)
CO2: 29 mmol/L (ref 20–32)
Calcium: 10.2 mg/dL (ref 8.6–10.4)
Chloride: 98 mmol/L (ref 98–110)
Creat: 2.04 mg/dL — ABNORMAL HIGH (ref 0.50–1.05)
GFR, Est African American: 30 mL/min/{1.73_m2} — ABNORMAL LOW (ref >=60)
GFR, Est Non African American: 26 mL/min/{1.73_m2} — ABNORMAL LOW (ref >=60)
Glucose, Bld: 215 mg/dL — ABNORMAL HIGH (ref 65–99)
Potassium: 3.3 mmol/L — ABNORMAL LOW (ref 3.5–5.3)
Sodium: 139 mmol/L (ref 135–146)

## 2020-04-25 ENCOUNTER — Telehealth: Payer: Self-pay | Admitting: *Deleted

## 2020-04-25 ENCOUNTER — Other Ambulatory Visit: Payer: Self-pay | Admitting: *Deleted

## 2020-04-25 ENCOUNTER — Encounter: Payer: Self-pay | Admitting: Physician Assistant

## 2020-04-25 DIAGNOSIS — I10 Essential (primary) hypertension: Secondary | ICD-10-CM

## 2020-04-25 DIAGNOSIS — E1169 Type 2 diabetes mellitus with other specified complication: Secondary | ICD-10-CM

## 2020-04-25 NOTE — Telephone Encounter (Signed)
Received fax that pt's insurance will not cover Tradjenta.

## 2020-04-25 NOTE — Telephone Encounter (Signed)
OK, she will need to call them to see what they will cover or initiate a PA

## 2020-04-25 NOTE — Telephone Encounter (Signed)
error 

## 2020-04-26 ENCOUNTER — Other Ambulatory Visit: Payer: Self-pay | Admitting: Family Medicine

## 2020-04-26 ENCOUNTER — Other Ambulatory Visit: Payer: Self-pay | Admitting: *Deleted

## 2020-04-26 ENCOUNTER — Encounter: Payer: Self-pay | Admitting: Family Medicine

## 2020-04-26 DIAGNOSIS — I10 Essential (primary) hypertension: Secondary | ICD-10-CM

## 2020-04-26 MED ORDER — VALSARTAN 320 MG PO TABS: 320.0000 mg | ORAL_TABLET | Freq: Every day | ORAL | 1 refills | Status: DC

## 2020-04-26 MED FILL — VALSARTAN 320 MG TABLET: 320 | 90 days supply | Qty: 90 | Fill #0

## 2020-04-26 NOTE — Addendum Note (Signed)
Addended by: Beatrice Lecher D on: 04/26/2020 09:52 AM   Modules accepted: Orders

## 2020-04-26 NOTE — Telephone Encounter (Signed)
Sent my chart message advising pt to call insurance company regarding this.

## 2020-04-29 ENCOUNTER — Other Ambulatory Visit: Payer: Self-pay

## 2020-04-29 ENCOUNTER — Ambulatory Visit: Payer: 59 | Admitting: Family Medicine

## 2020-04-29 ENCOUNTER — Encounter: Payer: Self-pay | Admitting: Family Medicine

## 2020-04-29 ENCOUNTER — Ambulatory Visit (INDEPENDENT_AMBULATORY_CARE_PROVIDER_SITE_OTHER): Payer: 59

## 2020-04-29 ENCOUNTER — Other Ambulatory Visit (HOSPITAL_COMMUNITY): Payer: Self-pay

## 2020-04-29 VITALS — BP 127/45 | HR 83 | Ht 60.0 in | Wt 158.0 lb

## 2020-04-29 DIAGNOSIS — D649 Anemia, unspecified: Secondary | ICD-10-CM | POA: Diagnosis not present

## 2020-04-29 DIAGNOSIS — R944 Abnormal results of kidney function studies: Secondary | ICD-10-CM

## 2020-04-29 DIAGNOSIS — T50905A Adverse effect of unspecified drugs, medicaments and biological substances, initial encounter: Secondary | ICD-10-CM | POA: Diagnosis not present

## 2020-04-29 DIAGNOSIS — R1013 Epigastric pain: Secondary | ICD-10-CM

## 2020-04-29 DIAGNOSIS — R634 Abnormal weight loss: Secondary | ICD-10-CM

## 2020-04-29 DIAGNOSIS — R110 Nausea: Secondary | ICD-10-CM | POA: Diagnosis not present

## 2020-04-29 DIAGNOSIS — R7989 Other specified abnormal findings of blood chemistry: Secondary | ICD-10-CM

## 2020-04-29 DIAGNOSIS — R630 Anorexia: Secondary | ICD-10-CM

## 2020-04-29 HISTORY — DX: Anemia, unspecified: D64.9

## 2020-04-29 MED ORDER — FAMOTIDINE 40 MG PO TABS
40.0000 mg | ORAL_TABLET | Freq: Two times a day (BID) | ORAL | 0 refills | Status: DC
  Filled 2020-04-29 – 2020-05-09 (×2): qty 60, 30d supply, fill #0

## 2020-04-29 NOTE — Telephone Encounter (Signed)
Labs ordered.  Most symptoms are much more significant with lactic acidosis and usually require hospitalization.  But then more than happy to draw the blood level.  Lab ordered.  She can go at her convenience.

## 2020-04-29 NOTE — Progress Notes (Signed)
Established Patient Office Visit  Subjective:  Patient ID: Maria Nunez, female    DOB: 1960/07/09  Age: 60 y.o. MRN: [id number]  CC:  Chief Complaint  Patient presents with  . Hypertension    HPI Geneieve Duell presents for follow-up of visit for nausea and vomiting.  When I last saw her about 2 week ago she had actually complained of a decreased appetite for a few months to the last about 3 pounds since I had last saw her.  She then came in and out 1 my partners about a week ago.  She had gone out to eat had about 4 bites of a salad and started vomiting.  She says ever since then she has only able to eat small bites she will start to get a little bit nauseated but has not vomited again.  She is not had any problems swallowing food.  She says on her home scale she is down to 155 pounds but on our scale here today, she is down 2 more pounds.  She did try the omeprazole that my partner gave her and says that she started getting diffuse itching all over her body so she only took it once she did not develop hives.  She did not have any Benadryl at home to take it eventually improved on its own.  She does not routinely take NSAIDs but does take Celebrex maybe once or twice a month and has not taken it recently.   Past Medical History:  Diagnosis Date  . Abnormal ultrasound of ovary 2016   Ultrasound showing bilateral ovaries in contact with one another.  Asymptomatic.  [name] Allergy   . Diabetes mellitus    borderline  . Fibroid   . Heart murmur   . Hidradenitis   . High cholesterol   . Hypertension     Past Surgical History:  Procedure Laterality Date  . ABDOMINAL HYSTERECTOMY     supracervical  . BREAST EXCISIONAL BIOPSY Right   . BREAST SURGERY     cysts removed right breast  . BREAST SURGERY  08/2018   benign mass  . CARPAL TUNNEL RELEASE Right 01/08/2017   Procedure: CARPAL TUNNEL RELEASE, POSSIBLE RIGHT THUMB CARPOMETACARPAL INJECTION;  Surgeon: Roseanne Kaufman, MD;  Location: [facility];  Service: Orthopedics;  Laterality: Right;  60 MINS  . CESAREAN SECTION    . INGUINAL HIDRADENITIS EXCISION    . LEEP  05/04/13   HPV change    Family History  Problem Relation Age of Onset  . Asthma Mother   . COPD Mother   . Diabetes Mother   . Hypertension Mother   . Diabetes Father   . Hypertension Father   . Breast cancer Maternal Aunt   . Breast cancer Paternal Aunt     Social History   Socioeconomic History  . Marital status: Divorced    Spouse name: Not on file  . Number of children: Not on file  . Years of education: Not on file  . Highest education level: Not on file  Occupational History  . Not on file  Tobacco Use  . Smoking status: Never Smoker  . Smokeless tobacco: Never Used  Vaping Use  . Vaping Use: Never used  Substance and Sexual Activity  . Alcohol use: No    Alcohol/week: 0.0 standard drinks  . Drug use: No  . Sexual activity: Not Currently    Partners: Male    Birth control/protection: Surgical  Comment: TAH/supracervical  Other Topics Concern  . Not on file  Social History Narrative  . Not on file   Social Determinants of Health   Financial Resource Strain: Not on file  Food Insecurity: Not on file  Transportation Needs: Not on file  Physical Activity: Not on file  Stress: Not on file  Social Connections: Not on file  Intimate Partner Violence: Not on file    Outpatient Medications Prior to Visit  Medication Sig Dispense Refill  . Ascorbic Acid (VITAMIN C) 1000 MG tablet Take 1 tablet by mouth daily.    [name] aspirin 81 MG tablet Take 81 mg by mouth daily.    . Blood Glucose Monitoring Suppl (FREESTYLE LITE) DEVI     . celecoxib (CELEBREX) 200 MG capsule One to 2 tablets by mouth daily as needed for pain. 60 capsule 2  . Cyanocobalamin (B-12) 500 MCG TABS Take 1 tablet by mouth daily.    . fluticasone (FLONASE) 50 MCG/ACT nasal spray Place 2 sprays into both nostrils daily. 16 g 6   . FOLIC ACID PO Take 144 mcg by mouth daily.    [name] glucose blood test strip USE TO TEST BLOOD SUGARS TWICE DAILY (Patient taking differently: USE TO TEST BLOOD SUGARS TWICE DAILY) 400 strip 12  . Lancets (FREESTYLE) lancets For use when testing blood sugars daily. DX:E11.69 200 each 12  . metFORMIN (GLUCOPHAGE) 1000 MG tablet TAKE 1 TABLET (1,000 MG TOTAL) BY MOUTH 2 (TWO) TIMES DAILY WITH A MEAL. 180 tablet 1  . metoCLOPramide (REGLAN) 10 MG tablet TAKE 1/2 TABLET (5 MG TOTAL) BY MOUTH EVERY 8 (EIGHT) HOURS AS NEEDED FOR NAUSEA (NAUSEA/REFLUX). 30 tablet 0  . Multiple Vitamin (MULTIVITAMIN) tablet Take 1 tablet by mouth daily.    . Omega-3 Fatty Acids (FISH OIL) 1000 MG CAPS Take 1,000 mg by mouth daily.     [name] pyridOXINE (VITAMIN B-6) 100 MG tablet Take 100 mg by mouth daily.    . rosuvastatin (CRESTOR) 40 MG tablet TAKE 1 TABLET (40 MG TOTAL) BY MOUTH DAILY. 90 tablet 3  . valsartan (DIOVAN) 320 MG tablet TAKE 1 TABLET (320 MG TOTAL) BY MOUTH DAILY. 90 tablet 1  . Zinc Sulfate (ZINC 15 PO) Take by mouth.    . metFORMIN (GLUCOPHAGE) 1000 MG tablet TAKE 1 TABLET (1,000 MG TOTAL) BY MOUTH DAILY WITH BREAKFAST. 30 tablet 0  . AMBULATORY NON FORMULARY MEDICATION Medication Name: CPAP with humidifier and supplies. Set Autopap 4-10 cm water pressure.  Moderate OSA with AHI of 25. 1 Units 0  . linagliptin (TRADJENTA) 5 MG TABS tablet TAKE 1 TABLET (5 MG TOTAL) BY MOUTH DAILY. (Patient not taking: No sig reported) 30 tablet 2  . COVID-19 mRNA vaccine, Pfizer, 30 MCG/0.3ML injection INJECT AS DIRECTED .3 mL 0  . losartan-hydrochlorothiazide (HYZAAR) 100-25 MG tablet TAKE 1 TABLET BY MOUTH DAILY. 90 tablet 0  . omeprazole (PRILOSEC) 40 MG capsule TAKE 1 CAPSULE (40 MG TOTAL) BY MOUTH DAILY. 30 capsule 1   No facility-administered medications prior to visit.    Allergies  Allergen Reactions  . Sulfonamide Derivatives Hives    REACTION: hives  . Xigduo Xr [Dapagliflozin-Metformin Hcl Er] Hives  .  Hycodan [Hydrocodone-Homatropine] Itching  . Omeprazole Itching  . Cefaclor Rash  . Cephalexin Rash  . Januvia [Sitagliptin] Nausea Only    Back pain  . Levofloxacin Itching and Rash  . Penicillins Other (See Comments)    unknown  . Tetracycline Nausea Only    GI  upset  . Victoza [Liraglutide] Other (See Comments)    Back pain    ROS Review of Systems    Objective:    Physical Exam Constitutional:      Appearance: She is well-developed.  HENT:     Head: Normocephalic and atraumatic.  Cardiovascular:     Rate and Rhythm: Normal rate and regular rhythm.     Heart sounds: Normal heart sounds.  Pulmonary:     Effort: Pulmonary effort is normal.     Breath sounds: Normal breath sounds.  Abdominal:     General: Abdomen is flat.     Palpations: Abdomen is soft. There is no mass.     Tenderness: There is no abdominal tenderness. There is no guarding.  Skin:    General: Skin is warm and dry.  Neurological:     Mental Status: She is alert and oriented to person, place, and time.  Psychiatric:        Behavior: Behavior normal.     BP (!) 127/45   Pulse 83   Ht 5' (1.524 m)   Wt 158 lb (71.7 kg)   LMP 01/27/2000 (Approximate)   SpO2 100%   BMI 30.86 kg/m  Wt Readings from Last 3 Encounters:  04/29/20 158 lb (71.7 kg)  04/22/20 158 lb (71.7 kg)  04/18/20 160 lb (72.6 kg)     There are no preventive care reminders to display for this patient.  There are no preventive care reminders to display for this patient.  Lab Results  Component Value Date   TSH 0.96 11/25/2017   Lab Results  Component Value Date   WBC 6.5 08/07/2019   HGB 13.2 08/07/2019   HCT 39.7 08/07/2019   MCV 88.6 08/07/2019   PLT 285 08/07/2019   Lab Results  Component Value Date   NA 139 04/24/2020   K 3.3 (L) 04/24/2020   CO2 29 04/24/2020   GLUCOSE 215 (H) 04/24/2020   BUN 33 (H) 04/24/2020   CREATININE 2.04 (H) 04/24/2020   BILITOT 0.4 08/07/2019   ALKPHOS 76 10/29/2015   AST 27  08/07/2019   ALT 49 (H) 08/07/2019   PROT 6.7 08/07/2019   ALBUMIN 4.4 10/29/2015   CALCIUM 10.2 04/24/2020   ANIONGAP 13 01/13/2019   Lab Results  Component Value Date   CHOL 191 08/07/2019   Lab Results  Component Value Date   HDL 44 (L) 08/07/2019   Lab Results  Component Value Date   LDLCALC 122 (H) 08/07/2019   Lab Results  Component Value Date   TRIG 140 08/07/2019   Lab Results  Component Value Date   CHOLHDL 4.3 08/07/2019   Lab Results  Component Value Date   HGBA1C 7.0 (A) 04/18/2020      Assessment & Plan:   Problem List Items Addressed This Visit      Other   Decreased appetite - Primary   Relevant Medications   famotidine (PEPCID) 40 MG tablet   Other Relevant Orders   Ambulatory referral to Gastroenterology   Lactic acid, plasma   Lipase   COMPLETE METABOLIC PANEL WITH GFR   CBC with Differential/Platelet    Other Visit Diagnoses    Nausea       Relevant Medications   famotidine (PEPCID) 40 MG tablet   Other Relevant Orders   Ambulatory referral to Gastroenterology   Lactic acid, plasma   Lipase   COMPLETE METABOLIC PANEL WITH GFR   CBC with Differential/Platelet   Abnormal weight loss  Relevant Orders   Ambulatory referral to Gastroenterology   Lactic acid, plasma   Lipase   COMPLETE METABOLIC PANEL WITH GFR   CBC with Differential/Platelet   Epigastric discomfort       Relevant Medications   famotidine (PEPCID) 40 MG tablet   Other Relevant Orders   Lactic acid, plasma   Lipase   COMPLETE METABOLIC PANEL WITH GFR   CBC with Differential/Platelet   Medication side effect, initial encounter         Decreased appetite now with nausea and occasional epigastric discomfort.  I feel like her symptoms have progressed in the last couple of weeks.  We will go ahead and place GI referral for further work-up consider gastritis versus gastroparesis.  Consider mild pancreatitis.  She would like to have a lactic acid checked.  We will  try famotidine.  Medication side effect-omeprazole added to intolerance list.  We will try promoting instead.  Meds ordered this encounter  Medications  . famotidine (PEPCID) 40 MG tablet    Sig: Take 1 tablet (40 mg total) by mouth 2 (two) times daily.    Dispense:  60 tablet    Refill:  0    Follow-up: No follow-ups on file.    Beatrice Lecher, MD

## 2020-05-01 ENCOUNTER — Encounter: Payer: Self-pay | Admitting: Family Medicine

## 2020-05-01 DIAGNOSIS — K760 Fatty (change of) liver, not elsewhere classified: Secondary | ICD-10-CM | POA: Insufficient documentation

## 2020-05-06 DIAGNOSIS — D649 Anemia, unspecified: Secondary | ICD-10-CM | POA: Diagnosis not present

## 2020-05-06 DIAGNOSIS — R110 Nausea: Secondary | ICD-10-CM | POA: Diagnosis not present

## 2020-05-06 DIAGNOSIS — R630 Anorexia: Secondary | ICD-10-CM | POA: Diagnosis not present

## 2020-05-06 DIAGNOSIS — R634 Abnormal weight loss: Secondary | ICD-10-CM | POA: Diagnosis not present

## 2020-05-06 DIAGNOSIS — R1013 Epigastric pain: Secondary | ICD-10-CM | POA: Diagnosis not present

## 2020-05-08 ENCOUNTER — Other Ambulatory Visit (HOSPITAL_COMMUNITY): Payer: Self-pay

## 2020-05-08 DIAGNOSIS — D649 Anemia, unspecified: Secondary | ICD-10-CM | POA: Insufficient documentation

## 2020-05-08 NOTE — Addendum Note (Signed)
Addended by: Silverio Decamp on: 05/08/2020 08:57 AM   Modules accepted: Orders

## 2020-05-08 NOTE — Assessment & Plan Note (Signed)
Pleasant 60 year old female with diabetes, nausea, vomiting, anorexia, now with anemia on labs hemoglobin down to 9, we certainly need to work her up for peptic ulcer disease or obstructing lesion, gastroparesis is also on the differential. She does have a referral for gastroenterology, hopefully we can get her in ASAP. We will go ahead and add some anemia labs to further delineate the type of anemia

## 2020-05-09 ENCOUNTER — Other Ambulatory Visit: Payer: Self-pay | Admitting: Family Medicine

## 2020-05-09 ENCOUNTER — Other Ambulatory Visit (HOSPITAL_BASED_OUTPATIENT_CLINIC_OR_DEPARTMENT_OTHER): Payer: Self-pay

## 2020-05-09 DIAGNOSIS — E1169 Type 2 diabetes mellitus with other specified complication: Secondary | ICD-10-CM

## 2020-05-09 LAB — CBC WITH DIFFERENTIAL/PLATELET
Absolute Monocytes: 555 cells/uL (ref 200–950)
Basophils Absolute: 29 cells/uL (ref 0–200)
Basophils Relative: 0.4 %
Eosinophils Absolute: 139 cells/uL (ref 15–500)
Eosinophils Relative: 1.9 %
HCT: 28 % — ABNORMAL LOW (ref 35.0–45.0)
Hemoglobin: 9.1 g/dL — ABNORMAL LOW (ref 11.7–15.5)
Lymphs Abs: 1942 cells/uL (ref 850–3900)
MCH: 29.3 pg (ref 27.0–33.0)
MCHC: 32.5 g/dL (ref 32.0–36.0)
MCV: 90 fL (ref 80.0–100.0)
MPV: 10.2 fL (ref 7.5–12.5)
Monocytes Relative: 7.6 %
Neutro Abs: 4636 cells/uL (ref 1500–7800)
Neutrophils Relative %: 63.5 %
Platelets: 274 10*3/uL (ref 140–400)
RBC: 3.11 10*6/uL — ABNORMAL LOW (ref 3.80–5.10)
RDW: 12.6 % (ref 11.0–15.0)
Total Lymphocyte: 26.6 %
WBC: 7.3 10*3/uL (ref 3.8–10.8)

## 2020-05-09 LAB — COMPLETE METABOLIC PANEL WITH GFR
AG Ratio: 1.7 (calc) (ref 1.0–2.5)
ALT: 21 U/L (ref 6–29)
AST: 16 U/L (ref 10–35)
Albumin: 4 g/dL (ref 3.6–5.1)
Alkaline phosphatase (APISO): 51 U/L (ref 37–153)
BUN/Creatinine Ratio: 15 (calc) (ref 6–22)
BUN: 27 mg/dL — ABNORMAL HIGH (ref 7–25)
CO2: 27 mmol/L (ref 20–32)
Calcium: 9.2 mg/dL (ref 8.6–10.4)
Chloride: 107 mmol/L (ref 98–110)
Creat: 1.83 mg/dL — ABNORMAL HIGH (ref 0.50–1.05)
GFR, Est African American: 34 mL/min/{1.73_m2} — ABNORMAL LOW (ref >=60)
GFR, Est Non African American: 30 mL/min/{1.73_m2} — ABNORMAL LOW (ref >=60)
Globulin: 2.3 g/dL (calc) (ref 1.9–3.7)
Glucose, Bld: 115 mg/dL — ABNORMAL HIGH (ref 65–99)
Potassium: 3.3 mmol/L — ABNORMAL LOW (ref 3.5–5.3)
Sodium: 143 mmol/L (ref 135–146)
Total Bilirubin: 0.2 mg/dL (ref 0.2–1.2)
Total Protein: 6.3 g/dL (ref 6.1–8.1)

## 2020-05-09 LAB — TEST AUTHORIZATION

## 2020-05-09 LAB — LACTIC ACID, PLASMA: LACTIC ACID: 0.7 mmol/L (ref 0.4–1.8)

## 2020-05-09 LAB — IRON,TIBC AND FERRITIN PANEL
%SAT: 18 % (calc) (ref 16–45)
Ferritin: 120 ng/mL (ref 16–232)
Iron: 52 ug/dL (ref 45–160)
TIBC: 286 mcg/dL (calc) (ref 250–450)

## 2020-05-09 LAB — LIPASE: Lipase: 33 U/L (ref 7–60)

## 2020-05-09 LAB — B12 AND FOLATE PANEL
Folate: 13 ng/mL
Vitamin B-12: 628 pg/mL (ref 200–1100)

## 2020-05-09 MED ORDER — FREESTYLE LITE TEST VI STRP
ORAL_STRIP | 12 refills | Status: DC
  Filled 2020-05-09: qty 200, 90d supply, fill #0
  Filled 2020-08-13: qty 200, 90d supply, fill #1
  Filled 2021-05-05: qty 200, 90d supply, fill #2

## 2020-05-09 MED FILL — Rosuvastatin Calcium Tab 40 MG: ORAL | 90 days supply | Qty: 90 | Fill #0 | Status: AC

## 2020-05-15 ENCOUNTER — Encounter: Payer: Self-pay | Admitting: Family Medicine

## 2020-05-15 DIAGNOSIS — I10 Essential (primary) hypertension: Secondary | ICD-10-CM

## 2020-05-16 ENCOUNTER — Encounter: Payer: Self-pay | Admitting: Gastroenterology

## 2020-05-16 ENCOUNTER — Other Ambulatory Visit: Payer: Self-pay

## 2020-05-16 ENCOUNTER — Ambulatory Visit: Payer: 59 | Admitting: Gastroenterology

## 2020-05-16 VITALS — BP 110/74 | HR 88 | Ht 60.0 in | Wt 159.4 lb

## 2020-05-16 DIAGNOSIS — R110 Nausea: Secondary | ICD-10-CM | POA: Diagnosis not present

## 2020-05-16 DIAGNOSIS — R0789 Other chest pain: Secondary | ICD-10-CM | POA: Diagnosis not present

## 2020-05-16 DIAGNOSIS — K219 Gastro-esophageal reflux disease without esophagitis: Secondary | ICD-10-CM

## 2020-05-16 DIAGNOSIS — R142 Eructation: Secondary | ICD-10-CM | POA: Diagnosis not present

## 2020-05-16 NOTE — Progress Notes (Signed)
Appt added to wait list.

## 2020-05-16 NOTE — Patient Instructions (Signed)
If you are age 60 or older, your body mass index should be between 23-30. Your Body mass index is 31.13 kg/m. If this is out of the aforementioned range listed, please consider follow up with your Primary Care Provider.  If you are age 67 or younger, your body mass index should be between 19-25. Your Body mass index is 31.13 kg/m. If this is out of the aformentioned range listed, please consider follow up with your Primary Care Provider.   You have been scheduled for an endoscopy. Please follow written instructions given to you at your visit today. If you use inhalers (even only as needed), please bring them with you on the day of your procedure.   Due to recent changes in healthcare laws, you may see the results of your imaging and laboratory studies on MyChart before your provider has had a chance to review them.  We understand that in some cases there may be results that are confusing or concerning to you. Not all laboratory results come back in the same time frame and the provider may be waiting for multiple results in order to interpret others.  Please give [place] 48 hours in order for your provider to thoroughly review all the results before contacting the office for clarification of your results.   It was a pleasure to see you today!  Alonza Bogus, PA-C

## 2020-05-16 NOTE — Progress Notes (Signed)
05/16/2020 Maria Nunez [id number] 06/16/1960   HISTORY OF PRESENT ILLNESS: This is a pleasant 60 year old female who is new to our office.  She is here today with complaints of nausea, belching, decreased appetite, weight loss.  She says that the symptoms began about 3 months ago.  She says that she lost over 10 pounds in 3 months due to nausea and vomiting.  She started Pepcid 40 mg twice daily and she is feeling much better.  She says that her appetite has improved and her weight is beginning to improve.  She says that she still has a lot of belching, which she never had in the past.  She denies any dysphagia, but says that she was having pain in the center of her chest under her breastbone.  That has improved as well.  She has never had an EGD in the past.  She had a colonoscopy in 2015 with Dr. Collene Mares, which showed only small internal hemorrhoids.  Repeat was recommended a 10-year interval.  She was referred here by Dr. Madilyn Fireman for the above symptoms.   Past Medical History:  Diagnosis Date  . Abnormal ultrasound of ovary 2016   Ultrasound showing bilateral ovaries in contact with one another.  Asymptomatic.  [name] Allergy   . Diabetes mellitus    borderline  . Fibroid   . Heart murmur   . Hidradenitis   . High cholesterol   . Hypertension    Past Surgical History:  Procedure Laterality Date  . ABDOMINAL HYSTERECTOMY     supracervical  . BREAST EXCISIONAL BIOPSY Right   . BREAST SURGERY     cysts removed right breast  . BREAST SURGERY  08/2018   benign mass  . CARPAL TUNNEL RELEASE Right 01/08/2017   Procedure: CARPAL TUNNEL RELEASE, POSSIBLE RIGHT THUMB CARPOMETACARPAL INJECTION;  Surgeon: Roseanne Kaufman, MD;  Location: [place];  Service: Orthopedics;  Laterality: Right;  60 MINS  . CESAREAN SECTION    . INGUINAL HIDRADENITIS EXCISION    . LEEP  05/04/13   HPV change    reports that she has never smoked. She has never used smokeless tobacco. She  reports that she does not drink alcohol and does not use drugs. family history includes Asthma in her mother; Breast cancer in her maternal aunt and paternal aunt; COPD in her mother; Diabetes in her father and mother; Hypertension in her father and mother. Allergies  Allergen Reactions  . Sulfonamide Derivatives Hives    REACTION: hives  . Xigduo Xr [Dapagliflozin-Metformin Hcl Er] Hives  . Hycodan [Hydrocodone-Homatropine] Itching  . Omeprazole Itching  . Cefaclor Rash  . Cephalexin Rash  . Januvia [Sitagliptin] Nausea Only    Back pain  . Levofloxacin Itching and Rash  . Penicillins Other (See Comments)    unknown  . Tetracycline Nausea Only    GI upset  . Victoza [Liraglutide] Other (See Comments)    Back pain      Outpatient Encounter Medications as of 05/16/2020  Medication Sig  . AMBULATORY NON FORMULARY MEDICATION Medication Name: CPAP with humidifier and supplies. Set Autopap 4-10 cm water pressure.  Moderate OSA with AHI of 25.  . Ascorbic Acid (VITAMIN C) 1000 MG tablet Take 1 tablet by mouth daily.  [name] aspirin 81 MG tablet Take 81 mg by mouth daily.  . Blood Glucose Monitoring Suppl (FREESTYLE LITE) DEVI   . celecoxib (CELEBREX) 200 MG capsule One to 2 tablets by mouth daily as needed for  pain.  . Cyanocobalamin (B-12) 500 MCG TABS Take 1 tablet by mouth daily.  . famotidine (PEPCID) 40 MG tablet Take 1 tablet by mouth two times daily.  . fluticasone (FLONASE) 50 MCG/ACT nasal spray Place 2 sprays into both nostrils daily.  [name] FOLIC ACID PO Take 194 mcg by mouth daily.  [name] glucose blood (FREESTYLE LITE) test strip USE TO TEST BLOOD SUGARS TWICE DAILY (Patient taking differently: USE TO TEST BLOOD SUGARS TWICE DAILY)  . Lancets (FREESTYLE) lancets For use when testing blood sugars daily. DX:E11.69  . linagliptin (TRADJENTA) 5 MG TABS tablet TAKE 1 TABLET (5 MG TOTAL) BY MOUTH DAILY.  . metFORMIN (GLUCOPHAGE) 1000 MG tablet TAKE 1 TABLET (1,000 MG TOTAL) BY MOUTH 2 (TWO)  TIMES DAILY WITH A MEAL.  [name] metoCLOPramide (REGLAN) 10 MG tablet TAKE 1/2 TABLET (5 MG TOTAL) BY MOUTH EVERY 8 (EIGHT) HOURS AS NEEDED FOR NAUSEA (NAUSEA/REFLUX).  . Multiple Vitamin (MULTIVITAMIN) tablet Take 1 tablet by mouth daily.  . Omega-3 Fatty Acids (FISH OIL) 1000 MG CAPS Take 1,000 mg by mouth daily.   [name] pyridOXINE (VITAMIN B-6) 100 MG tablet Take 100 mg by mouth daily.  . rosuvastatin (CRESTOR) 40 MG tablet TAKE 1 TABLET (40 MG TOTAL) BY MOUTH DAILY.  . valsartan (DIOVAN) 320 MG tablet TAKE 1 TABLET (320 MG TOTAL) BY MOUTH DAILY.  [name] Zinc Sulfate (ZINC 15 PO) Take by mouth.   No facility-administered encounter medications on file as of 05/16/2020.    REVIEW OF SYSTEMS  : All other systems reviewed and negative except where noted in the History of Present Illness.   PHYSICAL EXAM: BP 110/74   Pulse 88   Ht 5' (1.524 m)   Wt 159 lb 6.4 oz (72.3 kg)   LMP 01/27/2000 (Approximate)   SpO2 96%   BMI 31.13 kg/m  General: Well developed white female in no acute distress Head: Normocephalic and atraumatic Eyes:  Sclerae anicteric, conjunctiva pink. Ears: Normal auditory acuity Lungs: Clear throughout to auscultation; no W/R/R. Heart: Regular rate and rhythm; no M/R/G. Abdomen: Soft, non-distended.  BS present.  Non-tender.  Ventral hernia/diastasis noted without tenderness. Musculoskeletal: Symmetrical with no gross deformities  Skin: No lesions on visible extremities Extremities: No edema  Neurological: Alert oriented x 4, grossly non-focal Psychological:  Alert and cooperative. Normal mood and affect  ASSESSMENT AND PLAN: *GERD, nausea, atypical chest pain, belching:  Much improved on pepcid 40 mg BID.  Had a loss of appetite as well and she had lost about 10 pounds due to her symptoms, but her appetite has returned and she began to gain weight again.  She still has a lot of belching, which she says she never had before.  She never had an endoscopy in the past.  She would like  to have 1 performed for evaluation and reassurance.  We will plan for EGD with Dr. Tarri Glenn.  The risks, benefits, and alternatives to EGD were discussed with the patient and she consents to proceed.  Otherwise she will continue her Pepcid 40 mg twice daily for now.  CC:  Hali Marry, *

## 2020-05-16 NOTE — Progress Notes (Signed)
Reviewed and agree with management plans. Will place on cancellation list in the event that an earlier appointment becomes available.   Davene Jobin L. Tarri Glenn, MD, MPH

## 2020-05-30 ENCOUNTER — Other Ambulatory Visit: Payer: Self-pay

## 2020-05-30 ENCOUNTER — Encounter: Payer: Self-pay | Admitting: Family Medicine

## 2020-05-30 ENCOUNTER — Other Ambulatory Visit: Payer: Self-pay | Admitting: Family Medicine

## 2020-05-30 ENCOUNTER — Ambulatory Visit (INDEPENDENT_AMBULATORY_CARE_PROVIDER_SITE_OTHER): Payer: 59 | Admitting: Family Medicine

## 2020-05-30 VITALS — BP 134/55 | HR 81 | Ht 60.0 in | Wt 157.0 lb

## 2020-05-30 DIAGNOSIS — Z1211 Encounter for screening for malignant neoplasm of colon: Secondary | ICD-10-CM | POA: Insufficient documentation

## 2020-05-30 DIAGNOSIS — D649 Anemia, unspecified: Secondary | ICD-10-CM | POA: Diagnosis not present

## 2020-05-30 DIAGNOSIS — E119 Type 2 diabetes mellitus without complications: Secondary | ICD-10-CM | POA: Diagnosis not present

## 2020-05-30 DIAGNOSIS — Z0000 Encounter for general adult medical examination without abnormal findings: Secondary | ICD-10-CM

## 2020-05-30 DIAGNOSIS — H5213 Myopia, bilateral: Secondary | ICD-10-CM | POA: Diagnosis not present

## 2020-05-30 LAB — HM DIABETES EYE EXAM

## 2020-05-30 NOTE — Progress Notes (Signed)
Subjective:     Maria Nunez is a 60 y.o. female and is her e for a comprehensive physical exam. The patient reports no problems.  We recently did some labs because of some abdominal pain and her hemoglobin came back low at 9.1 it was 13 9 months ago.  We tried to add on some labs but they could not add it so we will do this today including iron, B12 and reticulocyte count.  Social History   Socioeconomic History  . Marital status: Divorced    Spouse name: Not on file  . Number of children: Not on file  . Years of education: Not on file  . Highest education level: Not on file  Occupational History  . Not on file  Tobacco Use  . Smoking status: Never Smoker  . Smokeless tobacco: Never Used  Vaping Use  . Vaping Use: Never used  Substance and Sexual Activity  . Alcohol use: No    Alcohol/week: 0.0 standard drinks  . Drug use: No  . Sexual activity: Not Currently    Partners: Male    Birth control/protection: Surgical    Comment: TAH/supracervical  Other Topics Concern  . Not on file  Social History Narrative  . Not on file   Social Determinants of Health   Financial Resource Strain: Not on file  Food Insecurity: Not on file  Transportation Needs: Not on file  Physical Activity: Not on file  Stress: Not on file  Social Connections: Not on file  Intimate Partner Violence: Not on file   Health Maintenance  Topic Date Due  . FOOT EXAM  07/06/2020  . OPHTHALMOLOGY EXAM  07/12/2020  . INFLUENZA VACCINE  08/26/2020  . HEMOGLOBIN A1C  10/19/2020  . PAP SMEAR-Modifier  07/10/2021  . MAMMOGRAM  09/11/2021  . COLONOSCOPY (Pts 45-10yrs Insurance coverage will need to be confirmed)  07/25/2023  . TETANUS/TDAP  04/02/2024  . PNEUMOCOCCAL POLYSACCHARIDE VACCINE AGE 60-64 HIGH RISK  Completed  . COVID-19 Vaccine  Completed  . Hepatitis C Screening  Completed  . HIV Screening  Completed  . HPV VACCINES  Aged Out    The following portions of the patient's history were  reviewed and updated as appropriate: allergies, current medications, past family history, past medical history, past social history, past surgical history and problem list.  Review of Systems A comprehensive review of systems was negative.   Objective:    BP (!) 134/55   Pulse 81   Ht 5' (1.524 m)   Wt 157 lb (71.2 kg)   LMP 01/27/2000 (Approximate)   SpO2 98%   BMI 30.66 kg/m  General appearance: alert, cooperative and appears stated age Head: Normocephalic, without obvious abnormality, atraumatic Eyes: conj clear, EOMi, PEERLA Ears: normal TM's and external ear canals both ears Nose: Nares normal. Septum midline. Mucosa normal. No drainage or sinus tenderness. Throat: lips, mucosa, and tongue normal; teeth and gums normal Neck: no adenopathy, no carotid bruit, no JVD, supple, symmetrical, trachea midline and thyroid not enlarged, symmetric, no tenderness/mass/nodules Back: symmetric, no curvature. ROM normal. No CVA tenderness. Lungs: clear to auscultation bilaterally Heart: regular rate and rhythm, S1, S2 normal, no murmur, click, rub or gallop Abdomen: soft, non-tender; bowel sounds normal; no masses,  no organomegaly Extremities: extremities normal, atraumatic, no cyanosis or edema Pulses: 2+ and symmetric Skin: Skin color, texture, turgor normal. No rashes or lesions Lymph nodes: Cervical adenopathy: nl and Supraclavicular adenopathy: nl Neurologic: Grossly normal    Assessment:  Healthy female exam.      Plan:     See After Visit Summary for Counseling Recommendations   Keep up a regular exercise program and make sure you are eating a healthy diet Try to eat 4 servings of dairy a day, or if you are lactose intolerant take a calcium with vitamin D daily.  Your vaccines are up to date.

## 2020-06-10 DIAGNOSIS — Z0000 Encounter for general adult medical examination without abnormal findings: Secondary | ICD-10-CM | POA: Diagnosis not present

## 2020-06-10 DIAGNOSIS — D649 Anemia, unspecified: Secondary | ICD-10-CM | POA: Diagnosis not present

## 2020-06-11 ENCOUNTER — Other Ambulatory Visit: Payer: Self-pay | Admitting: Family Medicine

## 2020-06-11 DIAGNOSIS — N179 Acute kidney failure, unspecified: Secondary | ICD-10-CM

## 2020-06-11 DIAGNOSIS — D649 Anemia, unspecified: Secondary | ICD-10-CM

## 2020-06-11 LAB — COMPLETE METABOLIC PANEL WITH GFR
AG Ratio: 1.9 (calc) (ref 1.0–2.5)
ALT: 17 U/L (ref 6–29)
AST: 16 U/L (ref 10–35)
Albumin: 4.5 g/dL (ref 3.6–5.1)
Alkaline phosphatase (APISO): 64 U/L (ref 37–153)
BUN/Creatinine Ratio: 15 (calc) (ref 6–22)
BUN: 26 mg/dL — ABNORMAL HIGH (ref 7–25)
CO2: 28 mmol/L (ref 20–32)
Calcium: 10.4 mg/dL (ref 8.6–10.4)
Chloride: 104 mmol/L (ref 98–110)
Creat: 1.68 mg/dL — ABNORMAL HIGH (ref 0.50–1.05)
GFR, Est African American: 38 mL/min/{1.73_m2} — ABNORMAL LOW (ref >=60)
GFR, Est Non African American: 33 mL/min/{1.73_m2} — ABNORMAL LOW (ref >=60)
Globulin: 2.4 g/dL (calc) (ref 1.9–3.7)
Glucose, Bld: 140 mg/dL — ABNORMAL HIGH (ref 65–99)
Potassium: 3.9 mmol/L (ref 3.5–5.3)
Sodium: 142 mmol/L (ref 135–146)
Total Bilirubin: 0.3 mg/dL (ref 0.2–1.2)
Total Protein: 6.9 g/dL (ref 6.1–8.1)

## 2020-06-11 LAB — IRON,TIBC AND FERRITIN PANEL
%SAT: 24 % (calc) (ref 16–45)
Ferritin: 109 ng/mL (ref 16–232)
Iron: 71 ug/dL (ref 45–160)
TIBC: 292 mcg/dL (calc) (ref 250–450)

## 2020-06-11 LAB — LIPID PANEL W/REFLEX DIRECT LDL
Cholesterol: 117 mg/dL (ref <200)
HDL: 44 mg/dL — ABNORMAL LOW (ref >=50)
LDL Cholesterol (Calc): 51 mg/dL (calc)
Non-HDL Cholesterol (Calc): 73 mg/dL (calc) (ref <130)
Total CHOL/HDL Ratio: 2.7 (calc) (ref <5.0)
Triglycerides: 138 mg/dL (ref <150)

## 2020-06-11 LAB — RETICULOCYTES
ABS Retic: 48100 cells/uL (ref 20000–80000)
Retic Ct Pct: 1.3 %

## 2020-06-11 LAB — B12 AND FOLATE PANEL
Folate: 20.9 ng/mL
Vitamin B-12: 732 pg/mL (ref 200–1100)

## 2020-06-11 NOTE — Telephone Encounter (Signed)
OK to add CBC 

## 2020-06-19 DIAGNOSIS — I10 Essential (primary) hypertension: Secondary | ICD-10-CM | POA: Diagnosis not present

## 2020-06-19 DIAGNOSIS — D649 Anemia, unspecified: Secondary | ICD-10-CM | POA: Diagnosis not present

## 2020-06-19 DIAGNOSIS — N179 Acute kidney failure, unspecified: Secondary | ICD-10-CM | POA: Diagnosis not present

## 2020-06-20 ENCOUNTER — Other Ambulatory Visit: Payer: Self-pay | Admitting: *Deleted

## 2020-06-20 ENCOUNTER — Encounter: Payer: Self-pay | Admitting: Family Medicine

## 2020-06-20 DIAGNOSIS — N179 Acute kidney failure, unspecified: Secondary | ICD-10-CM

## 2020-06-20 DIAGNOSIS — D649 Anemia, unspecified: Secondary | ICD-10-CM

## 2020-06-20 LAB — CBC WITH DIFFERENTIAL/PLATELET
Absolute Monocytes: 469 cells/uL (ref 200–950)
Basophils Absolute: 42 cells/uL (ref 0–200)
Basophils Relative: 0.6 %
Eosinophils Absolute: 161 cells/uL (ref 15–500)
Eosinophils Relative: 2.3 %
HCT: 31.9 % — ABNORMAL LOW (ref 35.0–45.0)
Hemoglobin: 10 g/dL — ABNORMAL LOW (ref 11.7–15.5)
Lymphs Abs: 1946 cells/uL (ref 850–3900)
MCH: 28.2 pg (ref 27.0–33.0)
MCHC: 31.3 g/dL — ABNORMAL LOW (ref 32.0–36.0)
MCV: 90.1 fL (ref 80.0–100.0)
MPV: 10.1 fL (ref 7.5–12.5)
Monocytes Relative: 6.7 %
Neutro Abs: 4382 cells/uL (ref 1500–7800)
Neutrophils Relative %: 62.6 %
Platelets: 288 10*3/uL (ref 140–400)
RBC: 3.54 10*6/uL — ABNORMAL LOW (ref 3.80–5.10)
RDW: 12.4 % (ref 11.0–15.0)
Total Lymphocyte: 27.8 %
WBC: 7 10*3/uL (ref 3.8–10.8)

## 2020-06-21 LAB — BASIC METABOLIC PANEL WITHOUT GFR
BUN/Creatinine Ratio: 13 (calc) (ref 6–22)
BUN: 18 mg/dL (ref 7–25)
CO2: 28 mmol/L (ref 20–32)
Calcium: 9.9 mg/dL (ref 8.6–10.4)
Chloride: 105 mmol/L (ref 98–110)
Creat: 1.43 mg/dL — ABNORMAL HIGH (ref 0.50–1.05)
GFR, Est African American: 46 mL/min/1.73m2 — ABNORMAL LOW
GFR, Est Non African American: 40 mL/min/1.73m2 — ABNORMAL LOW
Glucose, Bld: 121 mg/dL — ABNORMAL HIGH (ref 65–99)
Potassium: 4.1 mmol/L (ref 3.5–5.3)
Sodium: 142 mmol/L (ref 135–146)

## 2020-06-21 LAB — URINALYSIS, ROUTINE W REFLEX MICROSCOPIC
Bacteria, UA: NONE SEEN /HPF
Bilirubin Urine: NEGATIVE
Glucose, UA: NEGATIVE
Hgb urine dipstick: NEGATIVE
Ketones, ur: NEGATIVE
Nitrite: NEGATIVE
RBC / HPF: NONE SEEN /HPF (ref 0–2)
Specific Gravity, Urine: 1.01 (ref 1.001–1.035)
pH: 5.5 (ref 5.0–8.0)

## 2020-06-21 LAB — C-REACTIVE PROTEIN: CRP: 2 mg/L (ref <8.0)

## 2020-06-21 LAB — MICROALBUMIN / CREATININE URINE RATIO
Creatinine, Urine: 63 mg/dL (ref 20–275)
Microalb Creat Ratio: 79 ug/mg{creat} — ABNORMAL HIGH
Microalb, Ur: 5 mg/dL

## 2020-06-21 LAB — MICROSCOPIC MESSAGE

## 2020-06-21 LAB — SEDIMENTATION RATE: Sed Rate: 29 mm/h (ref 0–30)

## 2020-06-21 LAB — ANA: Anti Nuclear Antibody (ANA): NEGATIVE

## 2020-06-26 ENCOUNTER — Encounter: Payer: Self-pay | Admitting: Family Medicine

## 2020-06-26 ENCOUNTER — Other Ambulatory Visit (HOSPITAL_BASED_OUTPATIENT_CLINIC_OR_DEPARTMENT_OTHER): Payer: Self-pay

## 2020-06-26 ENCOUNTER — Telehealth (INDEPENDENT_AMBULATORY_CARE_PROVIDER_SITE_OTHER): Payer: 59 | Admitting: Family Medicine

## 2020-06-26 VITALS — BP 113/68 | HR 85 | Ht 60.0 in | Wt 156.2 lb

## 2020-06-26 DIAGNOSIS — E1169 Type 2 diabetes mellitus with other specified complication: Secondary | ICD-10-CM | POA: Diagnosis not present

## 2020-06-26 DIAGNOSIS — N179 Acute kidney failure, unspecified: Secondary | ICD-10-CM

## 2020-06-26 DIAGNOSIS — I10 Essential (primary) hypertension: Secondary | ICD-10-CM | POA: Diagnosis not present

## 2020-06-26 DIAGNOSIS — N183 Chronic kidney disease, stage 3 unspecified: Secondary | ICD-10-CM | POA: Insufficient documentation

## 2020-06-26 MED ORDER — VALSARTAN 40 MG PO TABS
40.0000 mg | ORAL_TABLET | Freq: Every day | ORAL | 0 refills | Status: DC
  Filled 2020-06-26: qty 30, 30d supply, fill #0

## 2020-06-26 MED ORDER — METFORMIN HCL 1000 MG PO TABS: 1000.0000 mg | ORAL_TABLET | Freq: Every day | ORAL | 0 refills | Status: DC

## 2020-06-26 NOTE — Progress Notes (Signed)
Virtual Visit via Video Note  I connected with Maria Nunez on 06/26/20 at 10:30 AM EDT by a video enabled telemedicine application and verified that I am speaking with the correct person using two identifiers.   I discussed the limitations of evaluation and management by telemedicine and the availability of in person appointments. The patient expressed understanding and agreed to proceed.  Patient location: at home Provider location: in office  Subjective:    CC: Recent lab results.   HPI:  HTN- doing well overall all off of BP meds but has been taking the Diovan PRN when BP is elevated and having to use it a few times a week.  She has the 320 mg dose at home.    She has also had recent significant bump in Cr for the last couple of months though it is trending down.    Lab Results  Component Value Date   CREATININE 1.43 (H) 06/19/2020   CREATININE 1.68 (H) 06/10/2020   CREATININE 1.83 (H) 05/06/2020   F/U DM - most blood sugars less than 130.    Past medical history, Surgical history, Family history not pertinant except as noted below, Social history, Allergies, and medications have been entered into the medical record, reviewed, and corrections made.    Objective:    General: Speaking clearly in complete sentences without any shortness of breath.  Alert and oriented x3.  Normal judgment. No apparent acute distress.    Impression and Recommendations:    HYPERTENSION, BENIGN SYSTEMIC BP still up and down. Mostly ok BP.  Continue to hold BP med unless SBP is high. Will change the 320mg  to 40mg  to use PRN until see Nephrology.  Hopefully can restart the dose daily for renal protection.   Type 2 diabetes mellitus with other specified complication (HCC) Taking metformin once a day. Serum Cr back down to 1.4.   AKI (acute kidney injury) ([place]) Nothing revealing on recent labs.  Has appt with Nephrology at the end of the month.  HOlding ARB except for occ use.  Cut  metformin down to QD     No orders of the defined types were placed in this encounter.   Meds ordered this encounter  Medications  . valsartan (DIOVAN) 40 MG tablet    Sig: Take 1 tablet (40 mg total) by mouth daily.    Dispense:  30 tablet    Refill:  0  . metFORMIN (GLUCOPHAGE) 1000 MG tablet    Sig: Take 1 tablet (1,000 mg total) by mouth daily with breakfast.    Dispense:  1 tablet    Refill:  0     I discussed the assessment and treatment plan with the patient. The patient was provided an opportunity to ask questions and all were answered. The patient agreed with the plan and demonstrated an understanding of the instructions.   The patient was advised to call back or seek an in-person evaluation if the symptoms worsen or if the condition fails to improve as anticipated.   Beatrice Lecher, MD

## 2020-06-26 NOTE — Assessment & Plan Note (Signed)
BP still up and down. Mostly ok BP.  Continue to hold BP med unless SBP is high. Will change the 320mg  to 40mg  to use PRN until see Nephrology.  Hopefully can restart the dose daily for renal protection.

## 2020-06-26 NOTE — Assessment & Plan Note (Signed)
Taking metformin once a day. Serum Cr back down to 1.4.

## 2020-06-26 NOTE — Assessment & Plan Note (Signed)
Nothing revealing on recent labs.  Has appt with Nephrology at the end of the month.  HOlding ARB except for occ use.  Cut metformin down to QD

## 2020-06-26 NOTE — Progress Notes (Signed)
BS: 126  Pt has f/u with Nephrology 29th.   No questions with regards to recent labs.

## 2020-06-28 ENCOUNTER — Other Ambulatory Visit (HOSPITAL_COMMUNITY): Payer: Self-pay

## 2020-07-02 ENCOUNTER — Encounter: Payer: Self-pay | Admitting: Family Medicine

## 2020-07-19 ENCOUNTER — Ambulatory Visit (AMBULATORY_SURGERY_CENTER): Payer: 59 | Admitting: Gastroenterology

## 2020-07-19 ENCOUNTER — Other Ambulatory Visit: Payer: Self-pay

## 2020-07-19 ENCOUNTER — Encounter: Payer: Self-pay | Admitting: Gastroenterology

## 2020-07-19 VITALS — BP 150/73 | HR 77 | Temp 98.2°F | Resp 12 | Ht 60.0 in | Wt 159.0 lb

## 2020-07-19 DIAGNOSIS — K219 Gastro-esophageal reflux disease without esophagitis: Secondary | ICD-10-CM

## 2020-07-19 DIAGNOSIS — R0789 Other chest pain: Secondary | ICD-10-CM | POA: Diagnosis not present

## 2020-07-19 DIAGNOSIS — R110 Nausea: Secondary | ICD-10-CM

## 2020-07-19 DIAGNOSIS — K2980 Duodenitis without bleeding: Secondary | ICD-10-CM

## 2020-07-19 DIAGNOSIS — R142 Eructation: Secondary | ICD-10-CM

## 2020-07-19 DIAGNOSIS — K222 Esophageal obstruction: Secondary | ICD-10-CM

## 2020-07-19 DIAGNOSIS — K2970 Gastritis, unspecified, without bleeding: Secondary | ICD-10-CM

## 2020-07-19 DIAGNOSIS — K209 Esophagitis, unspecified without bleeding: Secondary | ICD-10-CM | POA: Diagnosis not present

## 2020-07-19 HISTORY — PX: ESOPHAGOGASTRODUODENOSCOPY: SHX1529

## 2020-07-19 MED ORDER — SODIUM CHLORIDE 0.9 % IV SOLN: 500.0000 mL | Freq: Once | INTRAVENOUS | Status: DC

## 2020-07-19 NOTE — Progress Notes (Signed)
Called to room to assist during endoscopic procedure.  Patient ID and intended procedure confirmed with present staff. Received instructions for my participation in the procedure from the performing physician.  

## 2020-07-19 NOTE — Op Note (Signed)
[facility] Patient Name: Maria Nunez Procedure Date: 07/19/2020 10:03 AM MRN: [id number] Endoscopist: Thornton Park MD, MD Age: 60 Referring MD:  Date of Birth: 12-13-1960 Gender: Female Account #: [id number] Procedure:                Upper GI endoscopy Indications:              Unexplained chest pain, Eructation, Nausea Medicines:                Monitored Anesthesia Care Procedure:                Pre-Anesthesia Assessment:                           - Prior to the procedure, a History and Physical                            was performed, and patient medications and                            allergies were reviewed. The patient's tolerance of                            previous anesthesia was also reviewed. The risks                            and benefits of the procedure and the sedation                            options and risks were discussed with the patient.                            All questions were answered, and informed consent                            was obtained. Prior Anticoagulants: The patient has                            taken no previous anticoagulant or antiplatelet                            agents. ASA Grade Assessment: II - A patient with                            mild systemic disease. After reviewing the risks                            and benefits, the patient was deemed in                            satisfactory condition to undergo the procedure.                           After obtaining informed consent, the endoscope was  passed under direct vision. Throughout the                            procedure, the patient's blood pressure, pulse, and                            oxygen saturations were monitored continuously. The                            GIF [id number] #[id number] was introduced through the                            mouth, and advanced to the third part of duodenum.                            The  upper GI endoscopy was accomplished without                            difficulty. The patient tolerated the procedure                            well. Scope In: Scope Out: Findings:                 The examined esophagus was normal except for a                            nonobstructing stricture in the distal esophagus.                            The z-line is located 35cm from the incisors.                            Biopsies were obtained from the proximal and distal                            esophagus with cold forceps for histology.                           Localized mildly erythematous mucosa without                            bleeding was found in the gastric body. Biopsies                            were taken with a cold forceps for histology.                            Estimated blood loss was minimal.                           Localized mildly erythematous mucosa without active                            bleeding and with no  stigmata of bleeding was found                            in the duodenal bulb. Biopsies were taken with a                            cold forceps for histology. Estimated blood loss                            was minimal.                           The cardia and gastric fundus were normal on                            retroflexion.                           The exam was otherwise without abnormality. Complications:            No immediate complications. Estimated blood loss:                            Minimal. Estimated Blood Loss:     Estimated blood loss was minimal. Impression:               - Non-obstructing distal esophageal stricture.                            Esophageal biopsies obtained.                           - Mild erythematous mucosa in the gastric body.                            Biopsied.                           - Mild erythematous duodenopathy. Biopsied.                           - The examination was otherwise  normal. Recommendation:           - Patient has a contact number available for                            emergencies. The signs and symptoms of potential                            delayed complications were discussed with the                            patient. Return to normal activities tomorrow.                            Written discharge instructions were provided to the  patient.                           - Resume previous diet.                           - Continue present medications.                           - No aspirin, ibuprofen, naproxen, or other                            non-steroidal anti-inflammatory drugs.                           - Await pathology results. Thornton Park MD, MD 07/19/2020 10:45:32 AM This report has been signed electronically.

## 2020-07-19 NOTE — Patient Instructions (Signed)
No Aspirin, ibuprofen, naproxen, or other non-steroidal anti-inflammatory drugs.   YOU HAD AN ENDOSCOPIC PROCEDURE TODAY AT [facility]:   Refer to the procedure report that was given to you for any specific questions about what was found during the examination.  If the procedure report does not answer your questions, please call your gastroenterologist to clarify.  If you requested that your care partner not be given the details of your procedure findings, then the procedure report has been included in a sealed envelope for you to review at your convenience later.  YOU SHOULD EXPECT: Some feelings of bloating in the abdomen. Passage of more gas than usual.  Walking can help get rid of the air that was put into your GI tract during the procedure and reduce the bloating. If you had a lower endoscopy (such as a colonoscopy or flexible sigmoidoscopy) you may notice spotting of blood in your stool or on the toilet paper. If you underwent a bowel prep for your procedure, you may not have a normal bowel movement for a few days.  Please Note:  You might notice some irritation and congestion in your nose or some drainage.  This is from the oxygen used during your procedure.  There is no need for concern and it should clear up in a day or so.  SYMPTOMS TO REPORT IMMEDIATELY:  Following upper endoscopy (EGD)  Vomiting of blood or coffee ground material  New chest pain or pain under the shoulder blades  Painful or persistently difficult swallowing  New shortness of breath  Fever of 100F or higher  Black, tarry-looking stools  For urgent or emergent issues, a gastroenterologist can be reached at any hour by calling [phone]. Do not use MyChart messaging for urgent concerns.    DIET:  We do recommend a small meal at first, but then you may proceed to your regular diet.  Drink plenty of fluids but you should avoid alcoholic beverages for 24 hours.  ACTIVITY:  You should plan to take  it easy for the rest of today and you should NOT DRIVE or use heavy machinery until tomorrow (because of the sedation medicines used during the test).    FOLLOW UP: Our staff will call the number listed on your records 48-72 hours following your procedure to check on you and address any questions or concerns that you may have regarding the information given to you following your procedure. If we do not reach you, we will leave a message.  We will attempt to reach you two times.  During this call, we will ask if you have developed any symptoms of COVID 19. If you develop any symptoms (ie: fever, flu-like symptoms, shortness of breath, cough etc.) before then, please call [phone].  If you test positive for Covid 19 in the 2 weeks post procedure, please call and report this information to [place].    If any biopsies were taken you will be contacted by phone or by letter within the next 1-3 weeks.  Please call us at [phone] if you have not heard about the biopsies in 3 weeks.    SIGNATURES/CONFIDENTIALITY: You and/or your care partner have signed paperwork which will be entered into your electronic medical record.  These signatures attest to the fact that that the information above on your After Visit Summary has been reviewed and is understood.  Full responsibility of the confidentiality of this discharge information lies with you and/or your care-partner.

## 2020-07-19 NOTE — Progress Notes (Signed)
Medical history reviewed with no changes noted. VS assessed by N.C 

## 2020-07-19 NOTE — Progress Notes (Signed)
pt tolerated well. VSS. awake and to recovery. Report given to RN.  Bite block left insitu to reovery, no trauma.

## 2020-07-22 ENCOUNTER — Other Ambulatory Visit: Payer: Self-pay

## 2020-07-22 ENCOUNTER — Ambulatory Visit: Payer: 59 | Admitting: Obstetrics and Gynecology

## 2020-07-22 ENCOUNTER — Encounter: Payer: 59 | Admitting: Obstetrics & Gynecology

## 2020-07-22 ENCOUNTER — Encounter: Payer: Self-pay | Admitting: Family Medicine

## 2020-07-22 ENCOUNTER — Other Ambulatory Visit (HOSPITAL_COMMUNITY): Payer: Self-pay

## 2020-07-22 ENCOUNTER — Ambulatory Visit: Payer: 59 | Admitting: Family Medicine

## 2020-07-22 VITALS — BP 130/72 | HR 81 | Ht 60.0 in | Wt 158.0 lb

## 2020-07-22 DIAGNOSIS — E1169 Type 2 diabetes mellitus with other specified complication: Secondary | ICD-10-CM | POA: Diagnosis not present

## 2020-07-22 DIAGNOSIS — R110 Nausea: Secondary | ICD-10-CM | POA: Diagnosis not present

## 2020-07-22 DIAGNOSIS — N179 Acute kidney failure, unspecified: Secondary | ICD-10-CM | POA: Diagnosis not present

## 2020-07-22 DIAGNOSIS — I10 Essential (primary) hypertension: Secondary | ICD-10-CM | POA: Diagnosis not present

## 2020-07-22 DIAGNOSIS — R1013 Epigastric pain: Secondary | ICD-10-CM

## 2020-07-22 DIAGNOSIS — R630 Anorexia: Secondary | ICD-10-CM | POA: Diagnosis not present

## 2020-07-22 DIAGNOSIS — K219 Gastro-esophageal reflux disease without esophagitis: Secondary | ICD-10-CM | POA: Diagnosis not present

## 2020-07-22 LAB — POCT GLYCOSYLATED HEMOGLOBIN (HGB A1C): Hemoglobin A1C: 6.5 % — AB (ref 4.0–5.6)

## 2020-07-22 MED ORDER — FAMOTIDINE 40 MG PO TABS
40.0000 mg | ORAL_TABLET | Freq: Two times a day (BID) | ORAL | 1 refills | Status: DC
  Filled 2020-07-22: qty 180, 90d supply, fill #0
  Filled 2021-01-27: qty 180, 90d supply, fill #1
  Filled 2021-01-27: qty 180, 90d supply, fill #0

## 2020-07-22 NOTE — Assessment & Plan Note (Signed)
A1C looks great today at 6.5.  Keep up the exercise she started adding in the treadmill on her brakes for total of 30 minutes daily does bother her knee a little bit but she is been able to maintain and she also walks her dog twice a day.F/U in 3 mo holding her ARB for now because of recent acute kidney injury.  She does have an appointment on Wednesday For consultation with nephrology and they can decide whether or not she needs to restart it daily.

## 2020-07-22 NOTE — Assessment & Plan Note (Signed)
Well controlled. Continue current regimen. Follow up in  4 mo 

## 2020-07-22 NOTE — Assessment & Plan Note (Signed)
Continue with Pepcid we will refill medication today.  Continue to avoid all NSAIDs and aspirin.

## 2020-07-22 NOTE — Progress Notes (Signed)
Established Patient Office Visit  Subjective:  Patient ID: Maria Nunez, female    DOB: 1960/07/23  Age: 60 y.o. MRN: [id number]  CC:  Chief Complaint  Patient presents with   Diabetes   Hypertension     HPI Maria Nunez presents for   Hypertension- Pt denies chest pain, SOB, dizziness, or heart palpitations.  Taking meds as directed w/o problems.  Denies medication side effects.    Diabetes - no hypoglycemic events. No wounds or sores that are not healing well. No increased thirst or urination. Checking glucose at home. Taking medications as prescribed without any side effects.  She has started exercising more regularly.  Though she is also had a few more indiscretions with her diet she has been eating ice cream lately.  She was also previously having a lot of abdominal pain, belching and nausea.  She did get in with GI and had her upper GI done on June 24.  She did have a nonobstructing stricture in the distal esophagus as well as some erythematous mucosa in the gastric by body in the duodenal bulb.  Biopsies were taken.  She is taking her Pepcid and needs a refill she has been avoiding NSAIDs.  Past Medical History:  Diagnosis Date   Abnormal ultrasound of ovary 2016   Ultrasound showing bilateral ovaries in contact with one another.  Asymptomatic.   Allergy    Anemia    Diabetes mellitus    borderline   Fibroid    Heart murmur    Hidradenitis    High cholesterol    Hypertension    Sleep apnea     Past Surgical History:  Procedure Laterality Date   ABDOMINAL HYSTERECTOMY     supracervical   BREAST EXCISIONAL BIOPSY Right    BREAST SURGERY     cysts removed right breast   BREAST SURGERY  08/2018   benign mass   CARPAL TUNNEL RELEASE Right 01/08/2017   Procedure: CARPAL TUNNEL RELEASE, POSSIBLE RIGHT THUMB CARPOMETACARPAL INJECTION;  Surgeon: Roseanne Kaufman, MD;  Location: [facility];  Service: Orthopedics;  Laterality: Right;  96  MINS   CESAREAN SECTION     INGUINAL HIDRADENITIS EXCISION     LEEP  05/04/13   HPV change    Family History  Problem Relation Age of Onset   Asthma Mother    COPD Mother    Diabetes Mother    Hypertension Mother    Diabetes Father    Hypertension Father    Breast cancer Maternal Aunt    Breast cancer Paternal Aunt    Colon cancer Neg Hx    Pancreatic cancer Neg Hx    Esophageal cancer Neg Hx    Stomach cancer Neg Hx    Liver disease Neg Hx     Social History   Socioeconomic History   Marital status: Divorced    Spouse name: Not on file   Number of children: Not on file   Years of education: Not on file   Highest education level: Not on file  Occupational History   Not on file  Tobacco Use   Smoking status: Never   Smokeless tobacco: Never  Vaping Use   Vaping Use: Never used  Substance and Sexual Activity   Alcohol use: No    Alcohol/week: 0.0 standard drinks   Drug use: No   Sexual activity: Not Currently    Partners: Male    Birth control/protection: Surgical    Comment: TAH/supracervical  Other Topics Concern   Not on file  Social History Narrative   Not on file   Social Determinants of Health   Financial Resource Strain: Not on file  Food Insecurity: Not on file  Transportation Needs: Not on file  Physical Activity: Not on file  Stress: Not on file  Social Connections: Not on file  Intimate Partner Violence: Not on file    Outpatient Medications Prior to Visit  Medication Sig Dispense Refill   Ascorbic Acid (VITAMIN C) 1000 MG tablet Take 1 tablet by mouth daily.     Blood Glucose Monitoring Suppl (FREESTYLE LITE) DEVI      Cyanocobalamin (B-12) 500 MCG TABS Take 1 tablet by mouth daily.     FOLIC ACID PO Take 888 mcg by mouth daily.     glucose blood (FREESTYLE LITE) test strip USE TO TEST BLOOD SUGARS TWICE DAILY (Patient taking differently: USE TO TEST BLOOD SUGARS TWICE DAILY) 400 strip 12   Lancets (FREESTYLE) lancets For use when testing  blood sugars daily. DX:E11.69 200 each 12   metFORMIN (GLUCOPHAGE) 1000 MG tablet Take 1 tablet (1,000 mg total) by mouth daily with breakfast. 1 tablet 0   Multiple Vitamin (MULTIVITAMIN) tablet Take 1 tablet by mouth daily.     pyridOXINE (VITAMIN B-6) 100 MG tablet Take 100 mg by mouth daily.     rosuvastatin (CRESTOR) 40 MG tablet TAKE 1 TABLET (40 MG TOTAL) BY MOUTH DAILY. 90 tablet 3   valsartan (DIOVAN) 40 MG tablet Take 1 tablet (40 mg total) by mouth daily. 30 tablet 0   Zinc Sulfate (ZINC 15 PO) Take by mouth.     famotidine (PEPCID) 40 MG tablet Take 1 tablet by mouth two times daily. 60 tablet 0   AMBULATORY NON FORMULARY MEDICATION Medication Name: CPAP with humidifier and supplies. Set Autopap 4-10 cm water pressure.  Moderate OSA with AHI of 25. (Patient not taking: No sig reported) 1 Units 0   linagliptin (TRADJENTA) 5 MG TABS tablet TAKE 1 TABLET (5 MG TOTAL) BY MOUTH DAILY. (Patient not taking: No sig reported) 30 tablet 2   fluticasone (FLONASE) 50 MCG/ACT nasal spray Place 2 sprays into both nostrils daily. (Patient not taking: Reported on 07/19/2020) 16 g 6   Omega-3 Fatty Acids (FISH OIL) 1000 MG CAPS Take 1,000 mg by mouth daily.  (Patient not taking: Reported on 07/19/2020)     No facility-administered medications prior to visit.    Allergies  Allergen Reactions   Sulfonamide Derivatives Hives    REACTION: hives   Xigduo Xr [Dapagliflozin-Metformin Hcl Er] Hives   Hycodan [Hydrocodone Bit-Homatrop Mbr] Itching   Omeprazole Itching   Cefaclor Rash   Cephalexin Rash   Januvia [Sitagliptin] Nausea Only    Back pain   Levofloxacin Itching and Rash   Penicillins Other (See Comments)    unknown   Tetracycline Nausea Only    GI upset   Victoza [Liraglutide] Other (See Comments)    Back pain    ROS Review of Systems    Objective:    Physical Exam Constitutional:      Appearance: She is well-developed.  HENT:     Head: Normocephalic and atraumatic.   Cardiovascular:     Rate and Rhythm: Normal rate and regular rhythm.     Heart sounds: Normal heart sounds.  Pulmonary:     Effort: Pulmonary effort is normal.     Breath sounds: Normal breath sounds.  Skin:    General: Skin is  warm and dry.  Neurological:     Mental Status: She is alert and oriented to person, place, and time.  Psychiatric:        Behavior: Behavior normal.    BP 130/72   Pulse 81   Ht 5' (1.524 m)   Wt 158 lb (71.7 kg)   LMP 01/27/2000 (Approximate)   SpO2 97%   BMI 30.86 kg/m  Wt Readings from Last 3 Encounters:  07/22/20 158 lb (71.7 kg)  07/19/20 159 lb (72.1 kg)  06/26/20 156 lb 3.2 oz (70.9 kg)     Health Maintenance Due  Topic Date Due   Zoster Vaccines- Shingrix (1 of 2) Never done   COVID-19 Vaccine (4 - Booster for Pfizer series) 05/23/2020   FOOT EXAM  07/06/2020    There are no preventive care reminders to display for this patient.  Lab Results  Component Value Date   TSH 0.96 11/25/2017   Lab Results  Component Value Date   WBC 7.0 06/19/2020   HGB 10.0 (L) 06/19/2020   HCT 31.9 (L) 06/19/2020   MCV 90.1 06/19/2020   PLT 288 06/19/2020   Lab Results  Component Value Date   NA 142 06/19/2020   K 4.1 06/19/2020   CO2 28 06/19/2020   GLUCOSE 121 (H) 06/19/2020   BUN 18 06/19/2020   CREATININE 1.43 (H) 06/19/2020   BILITOT 0.3 06/10/2020   ALKPHOS 76 10/29/2015   AST 16 06/10/2020   ALT 17 06/10/2020   PROT 6.9 06/10/2020   ALBUMIN 4.4 10/29/2015   CALCIUM 9.9 06/19/2020   ANIONGAP 13 01/13/2019   Lab Results  Component Value Date   CHOL 117 06/10/2020   Lab Results  Component Value Date   HDL 44 (L) 06/10/2020   Lab Results  Component Value Date   LDLCALC 51 06/10/2020   Lab Results  Component Value Date   TRIG 138 06/10/2020   Lab Results  Component Value Date   CHOLHDL 2.7 06/10/2020   Lab Results  Component Value Date   HGBA1C 6.5 (A) 07/22/2020      Assessment & Plan:   Problem List Items  Addressed This Visit       Cardiovascular and Mediastinum   HYPERTENSION, BENIGN SYSTEMIC - Primary    Well controlled. Continue current regimen. Follow up in  4 mo          Digestive   Gastroesophageal reflux disease without esophagitis    Continue with Pepcid we will refill medication today.  Continue to avoid all NSAIDs and aspirin.       Relevant Medications   famotidine (PEPCID) 40 MG tablet     Endocrine   Type 2 diabetes mellitus with other specified complication (HCC)    E7O looks great today at 6.5.  Keep up the exercise she started adding in the treadmill on her brakes for total of 30 minutes daily does bother her knee a little bit but she is been able to maintain and she also walks her dog twice a day.F/U in 3 mo holding her ARB for now because of recent acute kidney injury.  She does have an appointment on Wednesday For consultation with nephrology and they can decide whether or not she needs to restart it daily.       Relevant Orders   POCT glycosylated hemoglobin (Hb A1C) (Completed)     Genitourinary   AKI (acute kidney injury) ([place])     Other   Decreased appetite   Other  Visit Diagnoses     Nausea       Epigastric discomfort          AKI - holding her ARB for now because of recent acute kidney injury.  She does have an appointment on Wednesday For consultation with nephrology and they can decide whether or not she needs to restart it daily.   Meds ordered this encounter  Medications   famotidine (PEPCID) 40 MG tablet    Sig: Take 1 tablet by mouth two times daily.    Dispense:  180 tablet    Refill:  1     Follow-up: Return in about 3 months (around 10/22/2020) for Diabetes follow-up.    Beatrice Lecher, MD

## 2020-07-23 ENCOUNTER — Telehealth: Payer: Self-pay | Admitting: *Deleted

## 2020-07-23 NOTE — Telephone Encounter (Signed)
  Follow up Call-  Call back number 07/19/2020  Post procedure Call Back phone  # [phone]  Permission to leave phone message Yes  Some recent data might be hidden     Patient questions:  Do you have a fever, pain , or abdominal swelling? No. Pain Score  0 *  Have you tolerated food without any problems? Yes.    Have you been able to return to your normal activities? Yes.    Do you have any questions about your discharge instructions: Diet   No. Medications  No. Follow up visit  No.  Do you have questions or concerns about your Care? No.  Actions: * If pain score is 4 or above: No action needed, pain <4.  Have you developed a fever since your procedure? no  2.   Have you had an respiratory symptoms (SOB or cough) since your procedure? no  3.   Have you tested positive for COVID 19 since your procedure no  4.   Have you had any family members/close contacts diagnosed with the COVID 19 since your procedure?  no   If yes to any of these questions please route to Joylene John, RN and Joella Prince, RN

## 2020-07-24 DIAGNOSIS — E1122 Type 2 diabetes mellitus with diabetic chronic kidney disease: Secondary | ICD-10-CM | POA: Diagnosis not present

## 2020-07-24 DIAGNOSIS — D649 Anemia, unspecified: Secondary | ICD-10-CM | POA: Diagnosis not present

## 2020-07-24 DIAGNOSIS — R112 Nausea with vomiting, unspecified: Secondary | ICD-10-CM | POA: Diagnosis not present

## 2020-07-24 DIAGNOSIS — N1832 Chronic kidney disease, stage 3b: Secondary | ICD-10-CM | POA: Diagnosis not present

## 2020-07-24 DIAGNOSIS — R809 Proteinuria, unspecified: Secondary | ICD-10-CM | POA: Diagnosis not present

## 2020-07-24 DIAGNOSIS — I129 Hypertensive chronic kidney disease with stage 1 through stage 4 chronic kidney disease, or unspecified chronic kidney disease: Secondary | ICD-10-CM | POA: Diagnosis not present

## 2020-07-24 DIAGNOSIS — N179 Acute kidney failure, unspecified: Secondary | ICD-10-CM | POA: Diagnosis not present

## 2020-07-31 ENCOUNTER — Other Ambulatory Visit: Payer: Self-pay | Admitting: Family Medicine

## 2020-07-31 ENCOUNTER — Other Ambulatory Visit (HOSPITAL_BASED_OUTPATIENT_CLINIC_OR_DEPARTMENT_OTHER): Payer: Self-pay

## 2020-07-31 ENCOUNTER — Other Ambulatory Visit: Payer: Self-pay

## 2020-07-31 ENCOUNTER — Telehealth: Payer: Self-pay | Admitting: Gastroenterology

## 2020-07-31 DIAGNOSIS — E1169 Type 2 diabetes mellitus with other specified complication: Secondary | ICD-10-CM

## 2020-07-31 MED ORDER — PANTOPRAZOLE SODIUM 40 MG PO TBEC
40.0000 mg | DELAYED_RELEASE_TABLET | Freq: Two times a day (BID) | ORAL | 3 refills | Status: DC
  Filled 2020-07-31: qty 60, 30d supply, fill #0

## 2020-07-31 NOTE — Telephone Encounter (Signed)
See result note.  

## 2020-07-31 NOTE — Telephone Encounter (Signed)
No call has been placed from this RN.

## 2020-08-01 ENCOUNTER — Other Ambulatory Visit (HOSPITAL_BASED_OUTPATIENT_CLINIC_OR_DEPARTMENT_OTHER): Payer: Self-pay

## 2020-08-01 ENCOUNTER — Other Ambulatory Visit: Payer: Self-pay | Admitting: Family Medicine

## 2020-08-01 ENCOUNTER — Other Ambulatory Visit: Payer: Self-pay | Admitting: *Deleted

## 2020-08-01 DIAGNOSIS — E1169 Type 2 diabetes mellitus with other specified complication: Secondary | ICD-10-CM

## 2020-08-01 MED ORDER — METFORMIN HCL 1000 MG PO TABS
1000.0000 mg | ORAL_TABLET | Freq: Every day | ORAL | 3 refills | Status: DC
  Filled 2020-08-01: qty 90, 90d supply, fill #0
  Filled 2020-11-04: qty 90, 90d supply, fill #1
  Filled 2021-01-27: qty 90, 90d supply, fill #2
  Filled 2021-05-05: qty 90, 90d supply, fill #3

## 2020-08-02 ENCOUNTER — Other Ambulatory Visit (HOSPITAL_COMMUNITY): Payer: Self-pay

## 2020-08-02 DIAGNOSIS — M47813 Spondylosis without myelopathy or radiculopathy, cervicothoracic region: Secondary | ICD-10-CM | POA: Diagnosis not present

## 2020-08-02 DIAGNOSIS — M542 Cervicalgia: Secondary | ICD-10-CM | POA: Diagnosis not present

## 2020-08-02 DIAGNOSIS — M47816 Spondylosis without myelopathy or radiculopathy, lumbar region: Secondary | ICD-10-CM | POA: Diagnosis not present

## 2020-08-02 DIAGNOSIS — M62830 Muscle spasm of back: Secondary | ICD-10-CM | POA: Diagnosis not present

## 2020-08-05 ENCOUNTER — Other Ambulatory Visit (HOSPITAL_BASED_OUTPATIENT_CLINIC_OR_DEPARTMENT_OTHER): Payer: Self-pay

## 2020-08-05 ENCOUNTER — Other Ambulatory Visit: Payer: Self-pay | Admitting: Family Medicine

## 2020-08-05 DIAGNOSIS — I10 Essential (primary) hypertension: Secondary | ICD-10-CM

## 2020-08-05 MED ORDER — VALSARTAN 40 MG PO TABS
40.0000 mg | ORAL_TABLET | Freq: Every day | ORAL | 1 refills | Status: DC
Start: 2020-08-05 — End: 2021-01-27
  Filled 2020-08-05: qty 90, 90d supply, fill #0
  Filled 2020-11-04: qty 90, 90d supply, fill #1

## 2020-08-06 ENCOUNTER — Other Ambulatory Visit (HOSPITAL_BASED_OUTPATIENT_CLINIC_OR_DEPARTMENT_OTHER): Payer: Self-pay

## 2020-08-07 ENCOUNTER — Other Ambulatory Visit: Payer: Self-pay | Admitting: Obstetrics and Gynecology

## 2020-08-07 DIAGNOSIS — M47816 Spondylosis without myelopathy or radiculopathy, lumbar region: Secondary | ICD-10-CM | POA: Diagnosis not present

## 2020-08-07 DIAGNOSIS — M542 Cervicalgia: Secondary | ICD-10-CM | POA: Diagnosis not present

## 2020-08-07 DIAGNOSIS — M62830 Muscle spasm of back: Secondary | ICD-10-CM | POA: Diagnosis not present

## 2020-08-07 DIAGNOSIS — Z1231 Encounter for screening mammogram for malignant neoplasm of breast: Secondary | ICD-10-CM

## 2020-08-07 DIAGNOSIS — M47813 Spondylosis without myelopathy or radiculopathy, cervicothoracic region: Secondary | ICD-10-CM | POA: Diagnosis not present

## 2020-08-13 ENCOUNTER — Other Ambulatory Visit (HOSPITAL_BASED_OUTPATIENT_CLINIC_OR_DEPARTMENT_OTHER): Payer: Self-pay

## 2020-08-13 ENCOUNTER — Other Ambulatory Visit: Payer: Self-pay | Admitting: Family Medicine

## 2020-08-13 MED ORDER — ROSUVASTATIN CALCIUM 40 MG PO TABS
ORAL_TABLET | Freq: Every day | ORAL | 3 refills | Status: DC
  Filled 2020-08-13: qty 90, 90d supply, fill #0
  Filled 2020-11-20: qty 90, 90d supply, fill #1
  Filled 2021-02-25: qty 90, 90d supply, fill #2
  Filled 2021-06-03: qty 90, 90d supply, fill #3

## 2020-08-16 DIAGNOSIS — N179 Acute kidney failure, unspecified: Secondary | ICD-10-CM | POA: Diagnosis not present

## 2020-08-17 DIAGNOSIS — G4733 Obstructive sleep apnea (adult) (pediatric): Secondary | ICD-10-CM | POA: Diagnosis not present

## 2020-08-19 ENCOUNTER — Other Ambulatory Visit: Payer: Self-pay

## 2020-08-19 ENCOUNTER — Ambulatory Visit (INDEPENDENT_AMBULATORY_CARE_PROVIDER_SITE_OTHER): Payer: 59 | Admitting: Obstetrics & Gynecology

## 2020-08-19 ENCOUNTER — Encounter: Payer: Self-pay | Admitting: Obstetrics & Gynecology

## 2020-08-19 ENCOUNTER — Other Ambulatory Visit (HOSPITAL_COMMUNITY)
Admission: RE | Admit: 2020-08-19 | Discharge: 2020-08-19 | Disposition: A | Discharge status: Home / Self Care | Payer: 59 | Source: Ambulatory Visit | Attending: Obstetrics & Gynecology | Admitting: Obstetrics & Gynecology

## 2020-08-19 VITALS — BP 149/69 | HR 84 | Ht 60.0 in | Wt 160.0 lb

## 2020-08-19 DIAGNOSIS — Z01419 Encounter for gynecological examination (general) (routine) without abnormal findings: Secondary | ICD-10-CM | POA: Insufficient documentation

## 2020-08-19 NOTE — Progress Notes (Signed)
GYNECOLOGY ANNUAL PREVENTATIVE CARE ENCOUNTER NOTE  History:     Maria Nunez is a 60 y.o. [phone] female here for a routine annual gynecologic exam.  Current complaints: none.  History of [facility] in 2020 for fibroids. Was being followed by Dr. Quincy Simmonds in [place], but she recently moved to [place] and decided to switch to this office.  Denies abnormal vaginal bleeding, discharge, pelvic pain, urinary issues or other gynecologic concerns.  Not sexually active.    Gynecologic History Patient's last menstrual period was 01/27/2000 (approximate). Contraception: status post hysterectomy Last Pap: 07/11/2018. Results were: normal with negative HPV Last Mammogram: 09/12/2019. Results were: normal Last Colonoscopy: 07/24/2013. Results were: normal  Obstetric History OB History  Gravida Para Term Preterm AB Living  '3 2 2   1 2  '$ SAB IAB Ectopic Multiple Live Births  1            # Outcome Date GA Lbr Len/2nd Weight Sex Delivery Anes PTL Lv  3 SAB           2 Term           1 Term             Past Medical History:  Diagnosis Date   Abnormal ultrasound of ovary 2016   Ultrasound showing bilateral ovaries in contact with one another.  Asymptomatic.   Allergy    Anemia    Diabetes mellitus    borderline   Fibroid    Heart murmur    Hidradenitis    High cholesterol    Hypertension    Sleep apnea     Past Surgical History:  Procedure Laterality Date   BREAST EXCISIONAL BIOPSY Right    BREAST SURGERY     cysts removed right breast   BREAST SURGERY  08/2018   benign mass   CARPAL TUNNEL RELEASE Right 01/08/2017   Procedure: CARPAL TUNNEL RELEASE, POSSIBLE RIGHT THUMB CARPOMETACARPAL INJECTION;  Surgeon: Roseanne Kaufman, MD;  Location: [facility];  Service: Orthopedics;  Laterality: Right;  [place] HIDRADENITIS EXCISION     LEEP  05/04/2013   HPV change   SUPRACERVICAL ABDOMINAL HYSTERECTOMY  2002   Done for bleeding,  fibroids in [place]    Current Outpatient Medications on File Prior to Visit  Medication Sig Dispense Refill   AMBULATORY NON FORMULARY MEDICATION Medication Name: CPAP with humidifier and supplies. Set Autopap 4-10 cm water pressure.  Moderate OSA with AHI of 25. (Patient taking differently: Medication Name: CPAP with humidifier and supplies. Set Autopap 4-10 cm water pressure.  Moderate OSA with AHI of 25.) 1 Units 0   Ascorbic Acid (VITAMIN C) 1000 MG tablet Take 1 tablet by mouth daily.     Blood Glucose Monitoring Suppl (FREESTYLE LITE) DEVI      Cyanocobalamin (B-12) 500 MCG TABS Take 1 tablet by mouth daily.     famotidine (PEPCID) 40 MG tablet Take 1 tablet by mouth two times daily. [id number] tablet 1   FOLIC ACID PO Take [id number] mcg by mouth daily.     glucose blood (FREESTYLE LITE) test strip USE TO TEST BLOOD SUGARS TWICE DAILY (Patient taking differently: USE TO TEST BLOOD SUGARS TWICE DAILY) 400 strip 12   Lancets (FREESTYLE) lancets For use when testing blood sugars daily. DX:E11.69 200 each 12   metFORMIN (GLUCOPHAGE) 1000 MG tablet Take 1 tablet (1,000 mg total) by mouth daily with breakfast. 1 tablet  0   metFORMIN (GLUCOPHAGE) 1000 MG tablet Take 1 tablet (1,000 mg total) by mouth daily with breakfast. 90 tablet 3   Multiple Vitamin (MULTIVITAMIN) tablet Take 1 tablet by mouth daily.     pantoprazole (PROTONIX) 40 MG tablet Take 1 tablet (40 mg total) by mouth 2 (two) times daily. 60 tablet 3   pyridOXINE (VITAMIN B-6) 100 MG tablet Take 100 mg by mouth daily.     rosuvastatin (CRESTOR) 40 MG tablet TAKE 1 TABLET (40 MG TOTAL) BY MOUTH DAILY. 90 tablet 3   valsartan (DIOVAN) 40 MG tablet Take 1 tablet (40 mg total) by mouth daily. 90 tablet 1   Zinc Sulfate (ZINC 15 PO) Take by mouth.     linagliptin (TRADJENTA) 5 MG TABS tablet TAKE 1 TABLET (5 MG TOTAL) BY MOUTH DAILY. (Patient not taking: No sig reported) 30 tablet 2   No current facility-administered medications on file prior to visit.     Allergies  Allergen Reactions   Sulfonamide Derivatives Hives    REACTION: hives   Xigduo Xr [Dapagliflozin-Metformin Hcl Er] Hives   Hycodan [Hydrocodone Bit-Homatrop Mbr] Itching   Omeprazole Itching   Cefaclor Rash   Cephalexin Rash   Januvia [Sitagliptin] Nausea Only    Back pain   Levofloxacin Itching and Rash   Penicillins Other (See Comments)    unknown   Tetracycline Nausea Only    GI upset   Victoza [Liraglutide] Other (See Comments)    Back pain    Social History:  reports that she has never smoked. She has never used smokeless tobacco. She reports that she does not drink alcohol and does not use drugs.  Family History  Problem Relation Age of Onset   Asthma Mother    COPD Mother    Diabetes Mother    Hypertension Mother    Diabetes Father    Hypertension Father    Breast cancer Maternal Aunt    Breast cancer Paternal Aunt    Colon cancer Neg Hx    Pancreatic cancer Neg Hx    Esophageal cancer Neg Hx    Stomach cancer Neg Hx    Liver disease Neg Hx     The following portions of the patient's history were reviewed and updated as appropriate: allergies, current medications, past family history, past medical history, past social history, past surgical history and problem list.  Review of Systems Pertinent items noted in HPI and remainder of comprehensive ROS otherwise negative.  Physical Exam:  BP (!) 149/69   Pulse 84   Ht 5' (1.524 m)   Wt 160 lb (72.6 kg)   LMP 01/27/2000 (Approximate)   BMI 31.25 kg/m  CONSTITUTIONAL: Well-developed, well-nourished female in no acute distress.  HENT:  Normocephalic, atraumatic, External right and left ear normal.  EYES: Conjunctivae and EOM are normal. Pupils are equal, round, and reactive to light. No scleral icterus.  NECK: Normal range of motion, supple, no masses.  Normal thyroid.  SKIN: Skin is warm and dry. No rash noted. Not diaphoretic. No erythema. No pallor. MUSCULOSKELETAL: Normal range of motion.  No tenderness.  No cyanosis, clubbing, or edema. NEUROLOGIC: Alert and oriented to person, place, and time. Normal reflexes, muscle tone coordination.  PSYCHIATRIC: Normal mood and affect. Normal behavior. Normal judgment and thought content. CARDIOVASCULAR: Normal heart rate noted, regular rhythm RESPIRATORY: Clear to auscultation bilaterally. Effort and breath sounds normal, no problems with respiration noted. BREASTS: Symmetric in size. No masses, tenderness, skin changes, nipple drainage, or lymphadenopathy  bilaterally. Performed in the presence of a chaperone. ABDOMEN: Soft, obese, no distention noted.  No tenderness, rebound or guarding.  PELVIC: Normal appearing external genitalia and urethral meatus with moderate atrophy; atrophy vaginal mucosa and cervix. Cervical stenosis noted.  Pap smear obtained.  No palpable masses, no adnexal tenderness.  Performed in the presence of a chaperone.   Assessment and Plan:    1. Well woman exam with routine gynecological exam - Cytology - PAP Will follow up results of pap smear and manage accordingly. Mammogram scheduled for 10/02/20, she is up to date on colon cancer screening. Routine preventative health maintenance measures emphasized. Please refer to After Visit Summary for other counseling recommendations.      Verita Schneiders, MD, [place] for [employer], [place]

## 2020-08-19 NOTE — Patient Instructions (Signed)
Preventive Care 60-60 Years Old, Female Preventive care refers to lifestyle choices and visits with your health care provider that can promote health and wellness. This includes: A yearly physical exam. This is also called an annual wellness visit. Regular dental and eye exams. Immunizations. Screening for certain conditions. Healthy lifestyle choices, such as: Eating a healthy diet. Getting regular exercise. Not using drugs or products that contain nicotine and tobacco. Limiting alcohol use. What can I expect for my preventive care visit? Physical exam Your health care provider will check your: Height and weight. These may be used to calculate your BMI (body mass index). BMI is a measurement that tells if you are at a healthy weight. Heart rate and blood pressure. Body temperature. Skin for abnormal spots. Counseling Your health care provider may ask you questions about your: Past medical problems. Family's medical history. Alcohol, tobacco, and drug use. Emotional well-being. Home life and relationship well-being. Sexual activity. Diet, exercise, and sleep habits. Work and work Statistician. Access to firearms. Method of birth control. Menstrual cycle. Pregnancy history. What immunizations do I need?  Vaccines are usually given at various ages, according to a schedule. Your health care provider will recommend vaccines for you based on your age, medicalhistory, and lifestyle or other factors, such as travel or where you work. What tests do I need? Blood tests Lipid and cholesterol levels. These may be checked every 5 years, or more often if you are over 60 years old. Hepatitis C test. Hepatitis B test. Screening Lung cancer screening. You may have this screening every year starting at age 60 if you have a 30-pack-year history of smoking and currently smoke or have quit within the past 15 years. Colorectal cancer screening. All adults should have this screening starting at  age 60 and continuing until age 60. Your health care provider may recommend screening at age 60 if you are at increased risk. You will have tests every 1-10 years, depending on your results and the type of screening test. Diabetes screening. This is done by checking your blood sugar (glucose) after you have not eaten for a while (fasting). You may have this done every 1-3 years. Mammogram. This may be done every 1-2 years. Talk with your health care provider about when you should start having regular mammograms. This may depend on whether you have a family history of breast cancer. BRCA-related cancer screening. This may be done if you have a family history of breast, ovarian, tubal, or peritoneal cancers. Pelvic exam and Pap test. This may be done every 3 years starting at age 60. Starting at age 60, this may be done every 5 years if you have a Pap test in combination with an HPV test. Other tests STD (sexually transmitted disease) testing, if you are at risk. Bone density scan. This is done to screen for osteoporosis. You may have this scan if you are at high risk for osteoporosis. Talk with your health care provider about your test results, treatment options,and if necessary, the need for more tests. Follow these instructions at home: Eating and drinking  Eat a diet that includes fresh fruits and vegetables, whole grains, lean protein, and low-fat dairy products. Take vitamin and mineral supplements as recommended by your health care provider. Do not drink alcohol if: Your health care provider tells you not to drink. You are pregnant, may be pregnant, or are planning to become pregnant. If you drink alcohol: Limit how much you have to 0-1 drink a day. Be aware  of how much alcohol is in your drink. In the [place] one drink equals one 12 oz bottle of beer (355 mL), one 5 oz glass of wine (148 mL), or one 1 oz glass of hard liquor (44 mL).  Lifestyle Take daily care of your teeth and  gums. Brush your teeth every morning and night with fluoride toothpaste. Floss one time each day. Stay active. Exercise for at least 30 minutes 5 or more days each week. Do not use any products that contain nicotine or tobacco, such as cigarettes, e-cigarettes, and chewing tobacco. If you need help quitting, ask your health care provider. Do not use drugs. If you are sexually active, practice safe sex. Use a condom or other form of protection to prevent STIs (sexually transmitted infections). If you do not wish to become pregnant, use a form of birth control. If you plan to become pregnant, see your health care provider for a prepregnancy visit. If told by your health care provider, take low-dose aspirin daily starting at age 60. Find healthy ways to cope with stress, such as: Meditation, yoga, or listening to music. Journaling. Talking to a trusted person. Spending time with friends and family. Safety Always wear your seat belt while driving or riding in a vehicle. Do not drive: If you have been drinking alcohol. Do not ride with someone who has been drinking. When you are tired or distracted. While texting. Wear a helmet and other protective equipment during sports activities. If you have firearms in your house, make sure you follow all gun safety procedures. What's next? Visit your health care provider once a year for an annual wellness visit. Ask your health care provider how often you should have your eyes and teeth checked. Stay up to date on all vaccines. This information is not intended to replace advice given to you by your health care provider. Make sure you discuss any questions you have with your healthcare provider. Document Revised: 10/17/2019 Document Reviewed: 09/23/2017 Elsevier Patient Education  2022 [employer].

## 2020-08-20 ENCOUNTER — Telehealth: Payer: Self-pay | Admitting: *Deleted

## 2020-08-20 NOTE — Telephone Encounter (Signed)
Please call pt and get her scheduled for f/u for OSA/CPAP. She has to have this done btw 8/23-10/21/2022. This can be done as a virtual visit if she is unable to come in.

## 2020-08-20 NOTE — Telephone Encounter (Signed)
Left voicemail message for patient to call back to get this appointment scheduled. AM

## 2020-08-23 LAB — CYTOLOGY - PAP
Comment: NEGATIVE
Comment: NEGATIVE
Diagnosis: NEGATIVE
HPV 16: NEGATIVE
HPV 18 / 45: NEGATIVE
High risk HPV: POSITIVE — AB

## 2020-08-27 ENCOUNTER — Encounter: Payer: Self-pay | Admitting: Family Medicine

## 2020-08-27 ENCOUNTER — Ambulatory Visit: Payer: 59 | Admitting: Family Medicine

## 2020-08-27 ENCOUNTER — Other Ambulatory Visit: Payer: Self-pay

## 2020-08-27 VITALS — BP 130/53 | HR 79 | Temp 98.6°F | Ht 60.0 in | Wt 159.0 lb

## 2020-08-27 DIAGNOSIS — Z23 Encounter for immunization: Secondary | ICD-10-CM

## 2020-08-27 DIAGNOSIS — G4733 Obstructive sleep apnea (adult) (pediatric): Secondary | ICD-10-CM | POA: Diagnosis not present

## 2020-08-27 NOTE — Progress Notes (Signed)
Established Patient Office Visit  Subjective:  Patient ID: Maria Nunez, female    DOB: Nov 22, 1960  Age: 60 y.o. MRN: [id number]  CC:  Chief Complaint  Patient presents with   Obstructive Sleep Apnea    HPI Maria Nunez presents for F/U OSA.  So far she is doing okay with the device but still struggling.  She says the hardest part is actually falling asleep with it on her face she says she feels like there is no air moving through it she did call the company and they were able to adjust the temperature and humidity a little bit and she says that has helped some.  But will often times she will lay there for hours before she finally falls asleep and then will usually sleep for couple hours and then wake up and end up pulling it off.  They have recommended a new facemask and they are sending her 1.  But they do need a new prescription for chinstrap.  She is only had 1 day where she was able to use it for a full consecutive 4 hours.  She gets her supplies through [facility].  Past Medical History:  Diagnosis Date   Abnormal ultrasound of ovary 2016   Ultrasound showing bilateral ovaries in contact with one another.  Asymptomatic.   Allergy    Anemia    Diabetes mellitus    borderline   Fibroid    Heart murmur    Hidradenitis    High cholesterol    Hypertension    Sleep apnea     Past Surgical History:  Procedure Laterality Date   BREAST EXCISIONAL BIOPSY Right    BREAST SURGERY     cysts removed right breast   BREAST SURGERY  08/2018   benign mass   CARPAL TUNNEL RELEASE Right 01/08/2017   Procedure: CARPAL TUNNEL RELEASE, POSSIBLE RIGHT THUMB CARPOMETACARPAL INJECTION;  Surgeon: Roseanne Kaufman, MD;  Location: [place];  Service: Orthopedics;  Laterality: Right;  24 MINS   CESAREAN SECTION     INGUINAL HIDRADENITIS EXCISION     LEEP  05/04/2013   HPV change   SUPRACERVICAL ABDOMINAL HYSTERECTOMY  2002   Done for bleeding, fibroids in [place]     Family History  Problem Relation Age of Onset   Asthma Mother    COPD Mother    Diabetes Mother    Hypertension Mother    Diabetes Father    Hypertension Father    Breast cancer Maternal Aunt    Breast cancer Paternal Aunt    Colon cancer Neg Hx    Pancreatic cancer Neg Hx    Esophageal cancer Neg Hx    Stomach cancer Neg Hx    Liver disease Neg Hx     Social History   Socioeconomic History   Marital status: Divorced    Spouse name: Not on file   Number of children: Not on file   Years of education: Not on file   Highest education level: Not on file  Occupational History   Not on file  Tobacco Use   Smoking status: Never   Smokeless tobacco: Never  Vaping Use   Vaping Use: Never used  Substance and Sexual Activity   Alcohol use: No    Alcohol/week: 0.0 standard drinks   Drug use: No   Sexual activity: Not Currently    Partners: Male    Birth control/protection: Surgical    Comment: TAH/supracervical  Other Topics Concern  Not on file  Social History Narrative   Not on file   Social Determinants of Health   Financial Resource Strain: Not on file  Food Insecurity: Not on file  Transportation Needs: Not on file  Physical Activity: Not on file  Stress: Not on file  Social Connections: Not on file  Intimate Partner Violence: Not on file    Outpatient Medications Prior to Visit  Medication Sig Dispense Refill   [place] Medication Name: CPAP with humidifier and supplies. Set Autopap 4-10 cm water pressure.  Moderate OSA with AHI of 25. (Patient taking differently: Medication Name: CPAP with humidifier and supplies. Set Autopap 4-10 cm water pressure.  Moderate OSA with AHI of 25.) 1 Units 0   Ascorbic Acid (VITAMIN C) 1000 MG tablet Take 1 tablet by mouth daily.     Blood Glucose Monitoring Suppl (FREESTYLE LITE) DEVI      Cyanocobalamin (B-12) 500 MCG TABS Take 1 tablet by mouth daily.     famotidine (PEPCID) 40 MG tablet Take  1 tablet by mouth two times daily. [id number] tablet 1   FOLIC ACID PO Take [id number] mcg by mouth daily.     glucose blood (FREESTYLE LITE) test strip USE TO TEST BLOOD SUGARS TWICE DAILY (Patient taking differently: USE TO TEST BLOOD SUGARS TWICE DAILY) 400 strip 12   Lancets (FREESTYLE) lancets For use when testing blood sugars daily. DX:E11.69 200 each 12   metFORMIN (GLUCOPHAGE) 1000 MG tablet Take 1 tablet (1,000 mg total) by mouth daily with breakfast. 1 tablet 0   metFORMIN (GLUCOPHAGE) 1000 MG tablet Take 1 tablet (1,000 mg total) by mouth daily with breakfast. 90 tablet 3   Multiple Vitamin (MULTIVITAMIN) tablet Take 1 tablet by mouth daily.     pantoprazole (PROTONIX) 40 MG tablet Take 1 tablet (40 mg total) by mouth 2 (two) times daily. 60 tablet 3   pyridOXINE (VITAMIN B-6) 100 MG tablet Take 100 mg by mouth daily.     rosuvastatin (CRESTOR) 40 MG tablet TAKE 1 TABLET (40 MG TOTAL) BY MOUTH DAILY. 90 tablet 3   valsartan (DIOVAN) 40 MG tablet Take 1 tablet (40 mg total) by mouth daily. 90 tablet 1   Zinc Sulfate (ZINC 15 PO) Take by mouth.     No facility-administered medications prior to visit.    Allergies  Allergen Reactions   Sulfonamide Derivatives Hives    REACTION: hives   Xigduo Xr [Dapagliflozin-Metformin Hcl Er] Hives   Hycodan [Hydrocodone Bit-Homatrop Mbr] Itching   Omeprazole Itching   Cefaclor Rash   Cephalexin Rash   Januvia [Sitagliptin] Nausea Only    Back pain   Levofloxacin Itching and Rash   Penicillins Other (See Comments)    unknown   Tetracycline Nausea Only    GI upset   Victoza [Liraglutide] Other (See Comments)    Back pain    ROS Review of Systems    Objective:    Physical Exam  BP (!) 130/53   Pulse 79   Temp 98.6 F (37 C) (Oral)   Ht 5' (1.524 m)   Wt 159 lb (72.1 kg)   LMP 01/27/2000 (Approximate)   SpO2 98% Comment: on RA  BMI 31.05 kg/m  Wt Readings from Last 3 Encounters:  08/27/20 159 lb (72.1 kg)  08/19/20 160 lb (72.6 kg)   07/22/20 158 lb (71.7 kg)     Health Maintenance Due  Topic Date Due   Zoster Vaccines- Shingrix (1 of 2) Never done  COVID-19 Vaccine (4 - Booster for Pfizer series) 05/23/2020   FOOT EXAM  07/06/2020   INFLUENZA VACCINE  08/26/2020    There are no preventive care reminders to display for this patient.  Lab Results  Component Value Date   TSH 0.96 11/25/2017   Lab Results  Component Value Date   WBC 7.0 06/19/2020   HGB 10.0 (L) 06/19/2020   HCT 31.9 (L) 06/19/2020   MCV 90.1 06/19/2020   PLT 288 06/19/2020   Lab Results  Component Value Date   NA 142 06/19/2020   K 4.1 06/19/2020   CO2 28 06/19/2020   GLUCOSE 121 (H) 06/19/2020   BUN 18 06/19/2020   CREATININE 1.43 (H) 06/19/2020   BILITOT 0.3 06/10/2020   ALKPHOS 76 10/29/2015   AST 16 06/10/2020   ALT 17 06/10/2020   PROT 6.9 06/10/2020   ALBUMIN 4.4 10/29/2015   CALCIUM 9.9 06/19/2020   ANIONGAP 13 01/13/2019   Lab Results  Component Value Date   CHOL 117 06/10/2020   Lab Results  Component Value Date   HDL 44 (L) 06/10/2020   Lab Results  Component Value Date   LDLCALC 51 06/10/2020   Lab Results  Component Value Date   TRIG 138 06/10/2020   Lab Results  Component Value Date   CHOLHDL 2.7 06/10/2020   Lab Results  Component Value Date   HGBA1C 6.5 (A) 07/22/2020      Assessment & Plan:   Problem List Items Addressed This Visit       Respiratory   OSA (obstructive sleep apnea) - Primary    We will contact [facility] to get a download off of her CPAP to see what pressure we need to see it sent her to.  We can then make adjustments if needed to ramp time etc.  Hopefully this will get her more comfortable.  We also discussed maybe even taking a Benadryl about an hour before bedtime to see if the sedation gets her little bit more comfortable so that she is actually able to wear the CPAP she does feel like she is less sleepy during the daytime and not feeling like she has to take a nap  at lunch which is great.  Plan to follow-up in about 1 to 2 months so that we can continue to troubleshoot the CPAP so that she can get comfortable and be successful with it.       Other Visit Diagnoses     Need for pneumococcal vaccination       Relevant Orders   Pneumococcal conjugate vaccine 20-valent (Prevnar 20) (Completed)      She has scheduled her mammogram for September.  No orders of the defined types were placed in this encounter.  Prevnar 20 given today.    Follow-up: No follow-ups on file.    Beatrice Lecher, MD

## 2020-08-27 NOTE — Assessment & Plan Note (Signed)
We will contact [facility] to get a download off of her CPAP to see what pressure we need to see it sent her to.  We can then make adjustments if needed to ramp time etc.  Hopefully this will get her more comfortable.  We also discussed maybe even taking a Benadryl about an hour before bedtime to see if the sedation gets her little bit more comfortable so that she is actually able to wear the CPAP she does feel like she is less sleepy during the daytime and not feeling like she has to take a nap at lunch which is great.  Plan to follow-up in about 1 to 2 months so that we can continue to troubleshoot the CPAP so that she can get comfortable and be successful with it.

## 2020-08-30 ENCOUNTER — Telehealth: Payer: Self-pay

## 2020-08-30 NOTE — Telephone Encounter (Signed)
LVM for [facility] to send most recent download of readings from pt's CPAP.  Charyl Bigger, CMA

## 2020-09-04 ENCOUNTER — Encounter: Payer: Self-pay | Admitting: Family Medicine

## 2020-09-05 NOTE — Telephone Encounter (Signed)
Left another message for a download from CPAP.

## 2020-09-05 NOTE — Telephone Encounter (Signed)
Compliance report received and placed in Dr. Gardiner Ramus basket

## 2020-09-09 NOTE — Telephone Encounter (Signed)
Received info.  Please send order to set CPAP to 10 cm water pressure.  Thank youThen will need download in 2 weeks after change of setting.

## 2020-09-10 MED ORDER — AMBULATORY NON FORMULARY MEDICATION: 0 refills | Status: AC

## 2020-09-10 NOTE — Telephone Encounter (Signed)
Faxed order to [facility] [phone]

## 2020-09-10 NOTE — Addendum Note (Signed)
Addended by: Narda Rutherford on: 09/10/2020 02:12 PM   Modules accepted: Orders

## 2020-09-17 DIAGNOSIS — G4733 Obstructive sleep apnea (adult) (pediatric): Secondary | ICD-10-CM | POA: Diagnosis not present

## 2020-09-24 ENCOUNTER — Other Ambulatory Visit: Payer: Self-pay

## 2020-09-24 ENCOUNTER — Ambulatory Visit (INDEPENDENT_AMBULATORY_CARE_PROVIDER_SITE_OTHER): Payer: 59

## 2020-09-24 ENCOUNTER — Ambulatory Visit: Payer: 59 | Admitting: Sports Medicine

## 2020-09-24 DIAGNOSIS — M722 Plantar fascial fibromatosis: Secondary | ICD-10-CM | POA: Diagnosis not present

## 2020-09-24 HISTORY — DX: Plantar fascial fibromatosis: M72.2

## 2020-09-24 NOTE — Assessment & Plan Note (Signed)
Maria Nunez returns, she is a very pleasant 60 year old female, she has known plantar fasciitis, last injected approximately 1 year ago. She has been doing all of her conservative stuff. She is having recurrence of pain bilaterally, right worse than left, worse with first few steps in the morning and with pain at the plantar fascial origin. Today we did bilateral plantar fasciitis injections, return to see me as needed.

## 2020-09-24 NOTE — Progress Notes (Signed)
    Procedures performed today:    Procedure: Real-time Ultrasound Guided injection of the left plantar fascia origin Device: Samsung HS60  Verbal informed consent obtained.  Time-out conducted.  Noted no overlying erythema, induration, or other signs of local infection.  Skin prepped in a sterile fashion.  Local anesthesia: Topical Ethyl chloride.  With sterile technique and under real time ultrasound guidance: Noted thickened plantar fascia, 1 cc Kenalog 40, 1 cc lidocaine, 1 cc bupivacaine injected easily Completed without difficulty  Advised to call if fevers/chills, erythema, induration, drainage, or persistent bleeding.  Images permanently stored and available for review in PACS.  Impression: Technically successful ultrasound guided injection.  Procedure: Real-time Ultrasound Guided injection of the right plantar fascia origin Device: Samsung HS60  Verbal informed consent obtained.  Time-out conducted.  Noted no overlying erythema, induration, or other signs of local infection.  Skin prepped in a sterile fashion.  Local anesthesia: Topical Ethyl chloride.  With sterile technique and under real time ultrasound guidance: Noted thickened plantar fascia, 1 cc Kenalog 40, 1 cc lidocaine, 1 cc bupivacaine injected easily Completed without difficulty  Advised to call if fevers/chills, erythema, induration, drainage, or persistent bleeding.  Images permanently stored and available for review in PACS.  Impression: Technically successful ultrasound guided injection.  Independent interpretation of notes and tests performed by another provider:   None.  Brief History, Exam, Impression, and Recommendations:    Plantar fasciitis, bilateral Maria Nunez returns, she is a very pleasant 60 year old female, she has known plantar fasciitis, last injected approximately 1 year ago. She has been doing all of her conservative stuff. She is having recurrence of pain bilaterally, right worse than  left, worse with first few steps in the morning and with pain at the plantar fascial origin. Today we did bilateral plantar fasciitis injections, return to see me as needed.    ___________________________________________ Gwen Her. Dianah Field, M.D., ABFM., CAQSM. Primary Care and [place] Instructor of [place] [employer] [facility] [employer]

## 2020-10-02 ENCOUNTER — Other Ambulatory Visit: Payer: Self-pay

## 2020-10-02 ENCOUNTER — Ambulatory Visit
Admission: RE | Admit: 2020-10-02 | Discharge: 2020-10-02 | Disposition: A | Discharge status: Home / Self Care | Payer: 59 | Source: Ambulatory Visit | Attending: Obstetrics and Gynecology | Admitting: Obstetrics and Gynecology

## 2020-10-02 DIAGNOSIS — Z1231 Encounter for screening mammogram for malignant neoplasm of breast: Secondary | ICD-10-CM | POA: Diagnosis not present

## 2020-10-03 NOTE — Progress Notes (Signed)
Please call patient. Normal mammogram.  Repeat in 1 year.  

## 2020-10-18 DIAGNOSIS — G4733 Obstructive sleep apnea (adult) (pediatric): Secondary | ICD-10-CM | POA: Diagnosis not present

## 2020-10-21 ENCOUNTER — Encounter: Payer: Self-pay | Admitting: Family Medicine

## 2020-10-22 ENCOUNTER — Ambulatory Visit (INDEPENDENT_AMBULATORY_CARE_PROVIDER_SITE_OTHER): Payer: 59 | Admitting: Family Medicine

## 2020-10-22 ENCOUNTER — Encounter: Payer: Self-pay | Admitting: Family Medicine

## 2020-10-22 ENCOUNTER — Other Ambulatory Visit: Payer: Self-pay

## 2020-10-22 VITALS — BP 132/59 | HR 71 | Ht 60.0 in | Wt 155.0 lb

## 2020-10-22 DIAGNOSIS — G4733 Obstructive sleep apnea (adult) (pediatric): Secondary | ICD-10-CM

## 2020-10-22 DIAGNOSIS — I10 Essential (primary) hypertension: Secondary | ICD-10-CM | POA: Diagnosis not present

## 2020-10-22 DIAGNOSIS — Z23 Encounter for immunization: Secondary | ICD-10-CM | POA: Diagnosis not present

## 2020-10-22 DIAGNOSIS — E1169 Type 2 diabetes mellitus with other specified complication: Secondary | ICD-10-CM | POA: Diagnosis not present

## 2020-10-22 LAB — POCT GLYCOSYLATED HEMOGLOBIN (HGB A1C): Hemoglobin A1C: 6.6 % — AB (ref 4.0–5.6)

## 2020-10-22 NOTE — Assessment & Plan Note (Addendum)
Far she is actually been doing really well in the 10 cm of water pressure she says the biggest improvement was getting a new mask that fits better.  That has been extremely helpful she is actually been able to wear her CPAP for 4 to 6 hours per night.  She was able to show me her data on her download on her app.  Once per hour or under 2.  Received fax compliance report.   87% for > 4 hours AHI 3.5

## 2020-10-22 NOTE — Assessment & Plan Note (Signed)
Repeat BP much better.

## 2020-10-22 NOTE — Progress Notes (Signed)
Established Patient Office Visit  Subjective:  Patient ID: Maria Nunez, female    DOB: 21-Apr-1960  Age: 60 y.o. MRN: [id number]  CC:  Chief Complaint  Patient presents with   Diabetes    HPI Maria Nunez presents for   F/U OSA -we recently adjusted the CPAP to 10 cm of water pressure.  Diabetes - no hypoglycemic events. No wounds or sores that are not healing well. No increased thirst or urination. Checking glucose at home. Taking medications as prescribed without any side effects.  Hypertension- Pt denies chest pain, SOB, dizziness, or heart palpitations.  Taking meds as directed w/o problems.  Denies medication side effects.    She is following up with nephrology in a couple of weeks as well chronic kidney disease.  Last serum creatinine was 1.3 on June 29.   Past Medical History:  Diagnosis Date   Abnormal ultrasound of ovary 2016   Ultrasound showing bilateral ovaries in contact with one another.  Asymptomatic.   Allergy    Anemia    Diabetes mellitus    borderline   Fibroid    Heart murmur    Hidradenitis    High cholesterol    Hypertension    Sleep apnea     Past Surgical History:  Procedure Laterality Date   BREAST EXCISIONAL BIOPSY Right    BREAST SURGERY     cysts removed right breast   BREAST SURGERY  08/2018   benign mass   CARPAL TUNNEL RELEASE Right 01/08/2017   Procedure: CARPAL TUNNEL RELEASE, POSSIBLE RIGHT THUMB CARPOMETACARPAL INJECTION;  Surgeon: Roseanne Kaufman, MD;  Location: [place] [facility];  Service: Orthopedics;  Laterality: Right;  57 MINS   CESAREAN SECTION     INGUINAL HIDRADENITIS EXCISION     LEEP  05/04/2013   HPV change   SUPRACERVICAL ABDOMINAL HYSTERECTOMY  2002   Done for bleeding, fibroids in [place]    Family History  Problem Relation Age of Onset   Asthma Mother    COPD Mother    Diabetes Mother    Hypertension Mother    Diabetes Father    Hypertension Father    Breast cancer Maternal Aunt     Breast cancer Paternal Aunt    Colon cancer Neg Hx    Pancreatic cancer Neg Hx    Esophageal cancer Neg Hx    Stomach cancer Neg Hx    Liver disease Neg Hx     Social History   Socioeconomic History   Marital status: Divorced    Spouse name: Not on file   Number of children: Not on file   Years of education: Not on file   Highest education level: Not on file  Occupational History   Not on file  Tobacco Use   Smoking status: Never   Smokeless tobacco: Never  Vaping Use   Vaping Use: Never used  Substance and Sexual Activity   Alcohol use: No    Alcohol/week: 0.0 standard drinks   Drug use: No   Sexual activity: Not Currently    Partners: Male    Birth control/protection: Surgical    Comment: TAH/supracervical  Other Topics Concern   Not on file  Social History Narrative   Not on file   Social Determinants of Health   Financial Resource Strain: Not on file  Food Insecurity: Not on file  Transportation Needs: Not on file  Physical Activity: Not on file  Stress: Not on file  Social Connections: Not on  file  Intimate Partner Violence: Not on file    Outpatient Medications Prior to Visit  Medication Sig Dispense Refill   AMBULATORY NON FORMULARY MEDICATION Set CPAP to 10 cm water pressure. Need download in 2 weeks after change of setting. 1 each 0   Ascorbic Acid (VITAMIN C) 1000 MG tablet Take 1 tablet by mouth daily.     Blood Glucose Monitoring Suppl (FREESTYLE LITE) DEVI      Cyanocobalamin (B-12) 500 MCG TABS Take 1 tablet by mouth daily.     famotidine (PEPCID) 40 MG tablet Take 1 tablet by mouth two times daily. 720 tablet 1   FOLIC ACID PO Take 947 mcg by mouth daily.     glucose blood (FREESTYLE LITE) test strip USE TO TEST BLOOD SUGARS TWICE DAILY (Patient taking differently: USE TO TEST BLOOD SUGARS TWICE DAILY) 400 strip 12   Lancets (FREESTYLE) lancets For use when testing blood sugars daily. DX:E11.69 200 each 12   metFORMIN (GLUCOPHAGE) 1000 MG  tablet Take 1 tablet (1,000 mg total) by mouth daily with breakfast. 90 tablet 3   pyridOXINE (VITAMIN B-6) 100 MG tablet Take 100 mg by mouth daily.     rosuvastatin (CRESTOR) 40 MG tablet TAKE 1 TABLET (40 MG TOTAL) BY MOUTH DAILY. 90 tablet 3   valsartan (DIOVAN) 40 MG tablet Take 1 tablet (40 mg total) by mouth daily. 90 tablet 1   Zinc Sulfate (ZINC 15 PO) Take by mouth.     AMBULATORY NON FORMULARY MEDICATION Medication Name: CPAP with humidifier and supplies. Set Autopap 4-10 cm water pressure.  Moderate OSA with AHI of 25. (Patient taking differently: Medication Name: CPAP with humidifier and supplies. Set Autopap 4-10 cm water pressure.  Moderate OSA with AHI of 25.) 1 Units 0   metFORMIN (GLUCOPHAGE) 1000 MG tablet Take 1 tablet (1,000 mg total) by mouth daily with breakfast. 1 tablet 0   Multiple Vitamin (MULTIVITAMIN) tablet Take 1 tablet by mouth daily.     pantoprazole (PROTONIX) 40 MG tablet Take 1 tablet (40 mg total) by mouth 2 (two) times daily. 60 tablet 3   No facility-administered medications prior to visit.    Allergies  Allergen Reactions   Sulfonamide Derivatives Hives    REACTION: hives   Xigduo Xr [Dapagliflozin-Metformin Hcl Er] Hives   Hycodan [Hydrocodone Bit-Homatrop Mbr] Itching   Omeprazole Itching   Cefaclor Rash   Cephalexin Rash   Januvia [Sitagliptin] Nausea Only    Back pain   Levofloxacin Itching and Rash   Penicillins Other (See Comments)    unknown   Tetracycline Nausea Only    GI upset   Victoza [Liraglutide] Other (See Comments)    Back pain    ROS Review of Systems    Objective:    Physical Exam Constitutional:      Appearance: Normal appearance. She is well-developed.  HENT:     Head: Normocephalic and atraumatic.  Cardiovascular:     Rate and Rhythm: Normal rate and regular rhythm.     Heart sounds: Normal heart sounds.  Pulmonary:     Effort: Pulmonary effort is normal.     Breath sounds: Normal breath sounds.  Skin:     General: Skin is warm and dry.  Neurological:     Mental Status: She is alert and oriented to person, place, and time.  Psychiatric:        Behavior: Behavior normal.    BP (!) 132/59   Pulse 71   Ht 5' (  1.524 m)   Wt 155 lb (70.3 kg)   LMP 01/27/2000 (Approximate)   SpO2 98%   BMI 30.27 kg/m  Wt Readings from Last 3 Encounters:  10/22/20 155 lb (70.3 kg)  08/27/20 159 lb (72.1 kg)  08/19/20 160 lb (72.6 kg)     Health Maintenance Due  Topic Date Due   Zoster Vaccines- Shingrix (1 of 2) Never done   COVID-19 Vaccine (4 - Booster for Pfizer series) 05/23/2020   FOOT EXAM  07/06/2020    There are no preventive care reminders to display for this patient.  Lab Results  Component Value Date   TSH 0.96 11/25/2017   Lab Results  Component Value Date   WBC 7.0 06/19/2020   HGB 10.0 (L) 06/19/2020   HCT 31.9 (L) 06/19/2020   MCV 90.1 06/19/2020   PLT 288 06/19/2020   Lab Results  Component Value Date   NA 142 06/19/2020   K 4.1 06/19/2020   CO2 28 06/19/2020   GLUCOSE 121 (H) 06/19/2020   BUN 18 06/19/2020   CREATININE 1.43 (H) 06/19/2020   BILITOT 0.3 06/10/2020   ALKPHOS 76 10/29/2015   AST 16 06/10/2020   ALT 17 06/10/2020   PROT 6.9 06/10/2020   ALBUMIN 4.4 10/29/2015   CALCIUM 9.9 06/19/2020   ANIONGAP 13 01/13/2019   Lab Results  Component Value Date   CHOL 117 06/10/2020   Lab Results  Component Value Date   HDL 44 (L) 06/10/2020   Lab Results  Component Value Date   LDLCALC 51 06/10/2020   Lab Results  Component Value Date   TRIG 138 06/10/2020   Lab Results  Component Value Date   CHOLHDL 2.7 06/10/2020   Lab Results  Component Value Date   HGBA1C 6.6 (A) 10/22/2020      Assessment & Plan:   Problem List Items Addressed This Visit       Cardiovascular and Mediastinum   HYPERTENSION, BENIGN SYSTEMIC - Primary    Repeat BP much better.          Respiratory   OSA (obstructive sleep apnea)    Far she is actually been doing  really well in the 10 cm of water pressure she says the biggest improvement was getting a new mask that fits better.  That has been extremely helpful she is actually been able to wear her CPAP for 4 to 6 hours per night.  She was able to show me her data on her download on her app.  Once per hour or under 2.        Endocrine   Type 2 diabetes mellitus with other specified complication ([place])    Is doing really well she is actually down her metformin to once a day her A1c is 6.6 which looks great and she is actually lost about 4 pounds she has been trying to exercise more consistently.      Relevant Orders   POCT glycosylated hemoglobin (Hb A1C) (Completed)   Other Visit Diagnoses     Need for immunization against influenza       Relevant Orders   Flu Vaccine QUAD 62mo+IM (Fluarix, Fluzone & Alfiuria Quad PF) (Completed)       No orders of the defined types were placed in this encounter.   Follow-up: Return in about 4 months (around 02/21/2021) for Diabetes follow-up, Hypertension.    Beatrice Lecher, MD

## 2020-10-22 NOTE — Assessment & Plan Note (Signed)
Is doing really well she is actually down her metformin to once a day her A1c is 6.6 which looks great and she is actually lost about 4 pounds she has been trying to exercise more consistently.

## 2020-10-28 DIAGNOSIS — M62830 Muscle spasm of back: Secondary | ICD-10-CM | POA: Diagnosis not present

## 2020-10-28 DIAGNOSIS — M47813 Spondylosis without myelopathy or radiculopathy, cervicothoracic region: Secondary | ICD-10-CM | POA: Diagnosis not present

## 2020-10-28 DIAGNOSIS — M47816 Spondylosis without myelopathy or radiculopathy, lumbar region: Secondary | ICD-10-CM | POA: Diagnosis not present

## 2020-10-28 DIAGNOSIS — M542 Cervicalgia: Secondary | ICD-10-CM | POA: Diagnosis not present

## 2020-10-31 ENCOUNTER — Encounter: Payer: Self-pay | Admitting: Family Medicine

## 2020-11-04 ENCOUNTER — Other Ambulatory Visit (HOSPITAL_BASED_OUTPATIENT_CLINIC_OR_DEPARTMENT_OTHER): Payer: Self-pay

## 2020-11-05 DIAGNOSIS — M542 Cervicalgia: Secondary | ICD-10-CM | POA: Diagnosis not present

## 2020-11-05 DIAGNOSIS — M62830 Muscle spasm of back: Secondary | ICD-10-CM | POA: Diagnosis not present

## 2020-11-05 DIAGNOSIS — M47816 Spondylosis without myelopathy or radiculopathy, lumbar region: Secondary | ICD-10-CM | POA: Diagnosis not present

## 2020-11-05 DIAGNOSIS — M47813 Spondylosis without myelopathy or radiculopathy, cervicothoracic region: Secondary | ICD-10-CM | POA: Diagnosis not present

## 2020-11-06 DIAGNOSIS — N1832 Chronic kidney disease, stage 3b: Secondary | ICD-10-CM | POA: Diagnosis not present

## 2020-11-13 DIAGNOSIS — G4733 Obstructive sleep apnea (adult) (pediatric): Secondary | ICD-10-CM | POA: Diagnosis not present

## 2020-11-17 DIAGNOSIS — G4733 Obstructive sleep apnea (adult) (pediatric): Secondary | ICD-10-CM | POA: Diagnosis not present

## 2020-11-18 DIAGNOSIS — M62830 Muscle spasm of back: Secondary | ICD-10-CM | POA: Diagnosis not present

## 2020-11-18 DIAGNOSIS — M47813 Spondylosis without myelopathy or radiculopathy, cervicothoracic region: Secondary | ICD-10-CM | POA: Diagnosis not present

## 2020-11-18 DIAGNOSIS — M542 Cervicalgia: Secondary | ICD-10-CM | POA: Diagnosis not present

## 2020-11-18 DIAGNOSIS — M47816 Spondylosis without myelopathy or radiculopathy, lumbar region: Secondary | ICD-10-CM | POA: Diagnosis not present

## 2020-11-20 ENCOUNTER — Other Ambulatory Visit (HOSPITAL_BASED_OUTPATIENT_CLINIC_OR_DEPARTMENT_OTHER): Payer: Self-pay

## 2020-11-22 DIAGNOSIS — N1831 Chronic kidney disease, stage 3a: Secondary | ICD-10-CM | POA: Diagnosis not present

## 2020-11-22 DIAGNOSIS — R809 Proteinuria, unspecified: Secondary | ICD-10-CM | POA: Diagnosis not present

## 2020-11-22 DIAGNOSIS — I129 Hypertensive chronic kidney disease with stage 1 through stage 4 chronic kidney disease, or unspecified chronic kidney disease: Secondary | ICD-10-CM | POA: Diagnosis not present

## 2020-11-22 DIAGNOSIS — E1122 Type 2 diabetes mellitus with diabetic chronic kidney disease: Secondary | ICD-10-CM | POA: Diagnosis not present

## 2020-11-22 DIAGNOSIS — D649 Anemia, unspecified: Secondary | ICD-10-CM | POA: Diagnosis not present

## 2020-12-02 DIAGNOSIS — M62830 Muscle spasm of back: Secondary | ICD-10-CM | POA: Diagnosis not present

## 2020-12-02 DIAGNOSIS — M47813 Spondylosis without myelopathy or radiculopathy, cervicothoracic region: Secondary | ICD-10-CM | POA: Diagnosis not present

## 2020-12-02 DIAGNOSIS — M542 Cervicalgia: Secondary | ICD-10-CM | POA: Diagnosis not present

## 2020-12-02 DIAGNOSIS — M47816 Spondylosis without myelopathy or radiculopathy, lumbar region: Secondary | ICD-10-CM | POA: Diagnosis not present

## 2020-12-24 DIAGNOSIS — G4733 Obstructive sleep apnea (adult) (pediatric): Secondary | ICD-10-CM | POA: Diagnosis not present

## 2021-01-21 DIAGNOSIS — M47816 Spondylosis without myelopathy or radiculopathy, lumbar region: Secondary | ICD-10-CM | POA: Diagnosis not present

## 2021-01-21 DIAGNOSIS — M62830 Muscle spasm of back: Secondary | ICD-10-CM | POA: Diagnosis not present

## 2021-01-21 DIAGNOSIS — M542 Cervicalgia: Secondary | ICD-10-CM | POA: Diagnosis not present

## 2021-01-21 DIAGNOSIS — M47813 Spondylosis without myelopathy or radiculopathy, cervicothoracic region: Secondary | ICD-10-CM | POA: Diagnosis not present

## 2021-01-22 NOTE — Progress Notes (Signed)
GYNECOLOGY  VISIT   HPI: 60 y.o.   Divorced  Caucasian  female   [phone] with Patient's last menstrual period was 01/27/2000 (approximate).   here for   vaginal discharge. Also she feels like she had an ovarian cyst rupture.  Some greenish and brown staining.   No itching or burning.   Feeling some pelvic pressure, starting last week.  Felt like ovulation pain and a cyst rupturing.  She would like to do a pelvic ultrasound.   Not sexually active currently.   GYNECOLOGIC HISTORY: Patient's last menstrual period was 01/27/2000 (approximate). Contraception:  supracervical hysterectomy Menopausal hormone therapy:  none Last mammogram:  10-02-20 category b density birads 1:neg Last pap smear:   08-19-20 neg HPV HR +, 07-11-18 neg HPV H R neg, 03-09-17 neg HPV HR neg        OB History     Gravida  3   Para  2   Term  2   Preterm      AB  1   Living  2      SAB  1   IAB      Ectopic      Multiple      Live Births                 Patient Active Problem List   Diagnosis Date Noted   CKD (chronic kidney disease) stage 3, GFR 30-59 ml/min ([place]) 06/26/2020   Morbid obesity ([place]) 05/30/2020   Screening for malignant neoplasm of colon 05/30/2020   Gastroesophageal reflux disease without esophagitis 05/16/2020   Atypical chest pain 05/16/2020   Normocytic anemia 05/08/2020   Fatty liver 05/01/2020   Decreased appetite 04/22/2020   Non-intractable vomiting 04/22/2020   OSA (obstructive sleep apnea) 02/08/2020   Plantar fasciitis, bilateral 09/19/2019   Pain in right hand 02/21/2018   Bursitis of left shoulder 01/24/2016   GAD (generalized anxiety disorder) 04/02/2015   Localized primary osteoarthritis of carpometacarpal joint of right thumb 03/15/2015   Carpal tunnel syndrome, bilateral 04/05/2013   LGSIL (low grade squamous intraepithelial dysplasia) 06/16/2012   Type 2 diabetes mellitus with other specified complication ([place]) 40/08/6759   PANIC ATTACK 09/07/2008    RESTLESS LEG SYNDROME 04/02/2008   POLYARTHRITIS 04/02/2008   Primary osteoarthritis of left knee 01/02/2008   HOT FLASHES 08/04/2006   Hyperlipidemia associated with type 2 diabetes mellitus ([place]) 11/03/2005   HYPERTENSION, BENIGN SYSTEMIC 11/03/2005    Past Medical History:  Diagnosis Date   Abnormal ultrasound of ovary 2016   Ultrasound showing bilateral ovaries in contact with one another.  Asymptomatic.   Allergy    Anemia    Diabetes mellitus    borderline   Fibroid    Heart murmur    Hidradenitis    High cholesterol    Hypertension    Sleep apnea     Past Surgical History:  Procedure Laterality Date   BREAST EXCISIONAL BIOPSY Right    BREAST SURGERY     cysts removed right breast   BREAST SURGERY  08/2018   benign mass   CARPAL TUNNEL RELEASE Right 01/08/2017   Procedure: CARPAL TUNNEL RELEASE, POSSIBLE RIGHT THUMB CARPOMETACARPAL INJECTION;  Surgeon: Roseanne Kaufman, MD;  Location: [facility];  Service: Orthopedics;  Laterality: Right;  [place]     LEEP  05/04/2013   HPV change   SUPRACERVICAL ABDOMINAL HYSTERECTOMY  2002   Done for  bleeding, fibroids in [place]    Current Outpatient Medications  Medication Sig Dispense Refill   AMBULATORY NON FORMULARY MEDICATION Set CPAP to 10 cm water pressure. Need download in 2 weeks after change of setting. 1 each 0   Ascorbic Acid (VITAMIN C) 1000 MG tablet Take 1 tablet by mouth daily.     Blood Glucose Monitoring Suppl (FREESTYLE LITE) DEVI      Cyanocobalamin (B-12) 500 MCG TABS Take 1 tablet by mouth daily.     famotidine (PEPCID) 40 MG tablet Take 1 tablet by mouth two times daily. 401 tablet 1   FOLIC ACID PO Take 027 mcg by mouth daily.     glucose blood (FREESTYLE LITE) test strip USE TO TEST BLOOD SUGARS TWICE DAILY (Patient taking differently: USE TO TEST BLOOD SUGARS TWICE DAILY) 400 strip 12   Lancets (FREESTYLE) lancets For use when testing  blood sugars daily. DX:E11.69 200 each 12   metFORMIN (GLUCOPHAGE) 1000 MG tablet Take 1 tablet (1,000 mg total) by mouth daily with breakfast. 90 tablet 3   pyridOXINE (VITAMIN B-6) 100 MG tablet Take 100 mg by mouth daily.     rosuvastatin (CRESTOR) 40 MG tablet TAKE 1 TABLET (40 MG TOTAL) BY MOUTH DAILY. 90 tablet 3   valsartan (DIOVAN) 40 MG tablet Take 1 tablet (40 mg total) by mouth daily. 90 tablet 1   Zinc Sulfate (ZINC 15 PO) Take by mouth.     No current facility-administered medications for this visit.     ALLERGIES: Sulfonamide derivatives, Xigduo xr [dapagliflozin-metformin hcl er], Hycodan [hydrocodone bit-homatrop mbr], Omeprazole, Cefaclor, Cephalexin, Januvia [sitagliptin], Levofloxacin, Penicillins, Tetracycline, and Victoza [liraglutide]  Family History  Problem Relation Age of Onset   Asthma Mother    COPD Mother    Diabetes Mother    Hypertension Mother    Diabetes Father    Hypertension Father    Breast cancer Maternal Aunt    Breast cancer Paternal Aunt    Colon cancer Neg Hx    Pancreatic cancer Neg Hx    Esophageal cancer Neg Hx    Stomach cancer Neg Hx    Liver disease Neg Hx     Social History   Socioeconomic History   Marital status: Divorced    Spouse name: Not on file   Number of children: Not on file   Years of education: Not on file   Highest education level: Not on file  Occupational History   Not on file  Tobacco Use   Smoking status: Never   Smokeless tobacco: Never  Vaping Use   Vaping Use: Never used  Substance and Sexual Activity   Alcohol use: No    Alcohol/week: 0.0 standard drinks   Drug use: No   Sexual activity: Not Currently    Partners: Male    Birth control/protection: Surgical    Comment: TAH/supracervical  Other Topics Concern   Not on file  Social History Narrative   Not on file   Social Determinants of Health   Financial Resource Strain: Not on file  Food Insecurity: Not on file  Transportation Needs: Not  on file  Physical Activity: Not on file  Stress: Not on file  Social Connections: Not on file  Intimate Partner Violence: Not on file    Review of Systems  See HPI.   PHYSICAL EXAMINATION:    BP 114/80    Pulse 83    Resp 16    Wt 163 lb (73.9 kg)    LMP  01/27/2000 (Approximate) Comment: supracervical hysterectomy   BMI 31.83 kg/m     General appearance: alert, cooperative and appears stated age   Pelvic: External genitalia:  no lesions              Urethra:  normal appearing urethra with no masses, tenderness or lesions              Bartholins and Skenes: normal                 Vagina: erythema of the vagina and orangish discharge.              Cervix: no lesions                Bimanual Exam:  Uterus:  absent              Adnexa: no mass, fullness, tenderness              Rectal exam: yes.  Confirms.              Anus:  normal sphincter tone, hemorrhoids noted.   Chaperone was present for exam:  Joy, CMA.  ASSESSMENT  Vaginitis.  Atrophy noted on exam today.  Pelvic pressure.  Pap normal and positive HR HPV. Neg 16/18/45. Hx LEEP with HPV changes noted.  Chronic renal disease.   PLAN  Swab for vaginitis collected. Start vaginal estrogen.  Rx for Vagifem.  Instructed in use.  I discussed potential effect on breast cancer and circulatory clotting.  Return for pelvic ultrasound. Pap and HR HPV in July, 2023 with annual exam.    An After Visit Summary was printed and given to the patient.  27 min  total time was spent for this patient encounter, including preparation, face-to-face counseling with the patient, coordination of care, and documentation of the encounter.

## 2021-01-23 ENCOUNTER — Encounter: Payer: Self-pay | Admitting: Obstetrics and Gynecology

## 2021-01-23 ENCOUNTER — Other Ambulatory Visit (HOSPITAL_COMMUNITY)
Admission: RE | Admit: 2021-01-23 | Discharge: 2021-01-23 | Disposition: A | Discharge status: Home / Self Care | Payer: 59 | Source: Ambulatory Visit | Attending: Obstetrics and Gynecology | Admitting: Obstetrics and Gynecology

## 2021-01-23 ENCOUNTER — Other Ambulatory Visit: Payer: Self-pay

## 2021-01-23 ENCOUNTER — Other Ambulatory Visit (HOSPITAL_BASED_OUTPATIENT_CLINIC_OR_DEPARTMENT_OTHER): Payer: Self-pay

## 2021-01-23 ENCOUNTER — Ambulatory Visit: Payer: 59 | Admitting: Obstetrics and Gynecology

## 2021-01-23 VITALS — BP 114/80 | HR 83 | Resp 16 | Wt 163.0 lb

## 2021-01-23 DIAGNOSIS — N760 Acute vaginitis: Secondary | ICD-10-CM | POA: Diagnosis not present

## 2021-01-23 DIAGNOSIS — R102 Pelvic and perineal pain: Secondary | ICD-10-CM

## 2021-01-23 DIAGNOSIS — R87618 Other abnormal cytological findings on specimens from cervix uteri: Secondary | ICD-10-CM | POA: Diagnosis not present

## 2021-01-23 MED ORDER — ESTRADIOL 10 MCG VA TABS
1.0000 | ORAL_TABLET | VAGINAL | 2 refills | Status: DC
  Filled 2021-01-23: qty 24, 84d supply, fill #0

## 2021-01-26 DIAGNOSIS — M479 Spondylosis, unspecified: Secondary | ICD-10-CM

## 2021-01-26 HISTORY — DX: Spondylosis, unspecified: M47.9

## 2021-01-27 ENCOUNTER — Other Ambulatory Visit (HOSPITAL_COMMUNITY): Payer: Self-pay

## 2021-01-27 ENCOUNTER — Other Ambulatory Visit: Payer: Self-pay | Admitting: Family Medicine

## 2021-01-27 ENCOUNTER — Other Ambulatory Visit (HOSPITAL_BASED_OUTPATIENT_CLINIC_OR_DEPARTMENT_OTHER): Payer: Self-pay

## 2021-01-27 DIAGNOSIS — I10 Essential (primary) hypertension: Secondary | ICD-10-CM

## 2021-01-28 ENCOUNTER — Other Ambulatory Visit (HOSPITAL_BASED_OUTPATIENT_CLINIC_OR_DEPARTMENT_OTHER): Payer: Self-pay

## 2021-01-28 LAB — CERVICOVAGINAL ANCILLARY ONLY
Bacterial Vaginitis (gardnerella): NEGATIVE
Candida Glabrata: NEGATIVE
Candida Vaginitis: NEGATIVE
Comment: NEGATIVE
Comment: NEGATIVE
Comment: NEGATIVE
Comment: NEGATIVE
Trichomonas: NEGATIVE

## 2021-01-28 MED ORDER — VALSARTAN 40 MG PO TABS
40.0000 mg | ORAL_TABLET | Freq: Every day | ORAL | 0 refills | Status: DC
  Filled 2021-01-28: qty 90, 90d supply, fill #0

## 2021-01-29 DIAGNOSIS — Z9109 Other allergy status, other than to drugs and biological substances: Secondary | ICD-10-CM | POA: Diagnosis not present

## 2021-01-29 DIAGNOSIS — I10 Essential (primary) hypertension: Secondary | ICD-10-CM | POA: Diagnosis not present

## 2021-01-29 DIAGNOSIS — E1122 Type 2 diabetes mellitus with diabetic chronic kidney disease: Secondary | ICD-10-CM | POA: Diagnosis not present

## 2021-01-29 DIAGNOSIS — R079 Chest pain, unspecified: Secondary | ICD-10-CM | POA: Diagnosis not present

## 2021-01-29 DIAGNOSIS — I129 Hypertensive chronic kidney disease with stage 1 through stage 4 chronic kidney disease, or unspecified chronic kidney disease: Secondary | ICD-10-CM | POA: Diagnosis not present

## 2021-01-29 DIAGNOSIS — Z880 Allergy status to penicillin: Secondary | ICD-10-CM | POA: Diagnosis not present

## 2021-01-29 DIAGNOSIS — N189 Chronic kidney disease, unspecified: Secondary | ICD-10-CM | POA: Diagnosis not present

## 2021-01-29 DIAGNOSIS — R918 Other nonspecific abnormal finding of lung field: Secondary | ICD-10-CM | POA: Diagnosis not present

## 2021-01-29 DIAGNOSIS — Z888 Allergy status to other drugs, medicaments and biological substances status: Secondary | ICD-10-CM | POA: Diagnosis not present

## 2021-01-29 DIAGNOSIS — R0789 Other chest pain: Secondary | ICD-10-CM | POA: Diagnosis not present

## 2021-01-30 DIAGNOSIS — R079 Chest pain, unspecified: Secondary | ICD-10-CM | POA: Diagnosis not present

## 2021-01-30 DIAGNOSIS — E782 Mixed hyperlipidemia: Secondary | ICD-10-CM | POA: Diagnosis not present

## 2021-01-30 DIAGNOSIS — I10 Essential (primary) hypertension: Secondary | ICD-10-CM | POA: Diagnosis not present

## 2021-02-06 DIAGNOSIS — M47813 Spondylosis without myelopathy or radiculopathy, cervicothoracic region: Secondary | ICD-10-CM | POA: Diagnosis not present

## 2021-02-06 DIAGNOSIS — M542 Cervicalgia: Secondary | ICD-10-CM | POA: Diagnosis not present

## 2021-02-06 DIAGNOSIS — M47816 Spondylosis without myelopathy or radiculopathy, lumbar region: Secondary | ICD-10-CM | POA: Diagnosis not present

## 2021-02-06 DIAGNOSIS — M62830 Muscle spasm of back: Secondary | ICD-10-CM | POA: Diagnosis not present

## 2021-02-19 DIAGNOSIS — R079 Chest pain, unspecified: Secondary | ICD-10-CM | POA: Diagnosis not present

## 2021-02-20 ENCOUNTER — Encounter: Payer: Self-pay | Admitting: Family Medicine

## 2021-02-20 ENCOUNTER — Ambulatory Visit: Payer: 59 | Admitting: Family Medicine

## 2021-02-20 ENCOUNTER — Other Ambulatory Visit: Payer: Self-pay

## 2021-02-20 VITALS — BP 139/48 | HR 80 | Resp 16 | Ht 60.0 in | Wt 161.0 lb

## 2021-02-20 DIAGNOSIS — E1169 Type 2 diabetes mellitus with other specified complication: Secondary | ICD-10-CM | POA: Diagnosis not present

## 2021-02-20 DIAGNOSIS — R0789 Other chest pain: Secondary | ICD-10-CM | POA: Diagnosis not present

## 2021-02-20 DIAGNOSIS — K760 Fatty (change of) liver, not elsewhere classified: Secondary | ICD-10-CM

## 2021-02-20 DIAGNOSIS — I10 Essential (primary) hypertension: Secondary | ICD-10-CM

## 2021-02-20 LAB — POCT GLYCOSYLATED HEMOGLOBIN (HGB A1C): Hemoglobin A1C: 6.8 % — AB (ref 4.0–5.6)

## 2021-02-20 NOTE — Assessment & Plan Note (Signed)
To need to follow liver function periodically.

## 2021-02-20 NOTE — Assessment & Plan Note (Signed)
She did have her stress test yesterday and has not heard back about the results.  It looks like everything was actually pretty reassuring overall.  She did have some slight EKG changes and they just recommended maximizing control of her risk factors.  We discussed that this includes just making sure that her blood pressures well controlled, diabetes, cholesterol etc.  She will be due for repeat lipid panel in May.

## 2021-02-20 NOTE — Progress Notes (Signed)
Established Patient Office Visit  Subjective:  Patient ID: Maria Nunez, female    DOB: [birth year]  Age: 61 y.o. MRN: [id number]  CC:  Chief Complaint  Patient presents with   Diabetes    Follow up    Hypertension    Follow up     HPI Maria Nunez presents for   Diabetes - no hypoglycemic events. No wounds or sores that are not healing well. No increased thirst or urination. Checking glucose at home. Taking medications as prescribed without any side effects.  Hypertension- Pt denies chest pain, SOB, dizziness, or heart palpitations.  Taking meds as directed w/o problems.  Denies medication side effects.    F/U Fatty liver -he has been walking daily for exercise which is fantastic!    Just had a stress test done yesterday.  She had had an episode of chest pain in early January that started on the right side and then radiated across her chest and was associated with significant nausea.  Past Medical History:  Diagnosis Date   Abnormal ultrasound of ovary 2016   Ultrasound showing bilateral ovaries in contact with one another.  Asymptomatic.   Allergy    Anemia    Diabetes mellitus    borderline   Fibroid    Heart murmur    Hidradenitis    High cholesterol    Hypertension    Sleep apnea     Past Surgical History:  Procedure Laterality Date   BREAST EXCISIONAL BIOPSY Right    BREAST SURGERY     cysts removed right breast   BREAST SURGERY  08/2018   benign mass   CARPAL TUNNEL RELEASE Right 01/08/2017   Procedure: CARPAL TUNNEL RELEASE, POSSIBLE RIGHT THUMB CARPOMETACARPAL INJECTION;  Surgeon: Roseanne Kaufman, MD;  Location: [facility];  Service: Orthopedics;  Laterality: Right;  33 MINS   CESAREAN SECTION     INGUINAL HIDRADENITIS EXCISION     LEEP  05/04/2013   HPV change   SUPRACERVICAL ABDOMINAL HYSTERECTOMY  2002   Done for bleeding, fibroids in [place]    Family History  Problem Relation Age of Onset   Asthma Mother    COPD  Mother    Diabetes Mother    Hypertension Mother    Diabetes Father    Hypertension Father    Breast cancer Maternal Aunt    Breast cancer Paternal Aunt    Colon cancer Neg Hx    Pancreatic cancer Neg Hx    Esophageal cancer Neg Hx    Stomach cancer Neg Hx    Liver disease Neg Hx     Social History   Socioeconomic History   Marital status: Divorced    Spouse name: Not on file   Number of children: Not on file   Years of education: Not on file   Highest education level: Not on file  Occupational History   Not on file  Tobacco Use   Smoking status: Never   Smokeless tobacco: Never  Vaping Use   Vaping Use: Never used  Substance and Sexual Activity   Alcohol use: No    Alcohol/week: 0.0 standard drinks   Drug use: No   Sexual activity: Not Currently    Partners: Male    Birth control/protection: Surgical    Comment: TAH/supracervical  Other Topics Concern   Not on file  Social History Narrative   Not on file   Social Determinants of Health   Financial Resource Strain: Not on file  Food Insecurity: Not on file  Transportation Needs: Not on file  Physical Activity: Not on file  Stress: Not on file  Social Connections: Not on file  Intimate Partner Violence: Not on file    Outpatient Medications Prior to Visit  Medication Sig Dispense Refill   AMBULATORY NON FORMULARY MEDICATION Set CPAP to 10 cm water pressure. Need download in 2 weeks after change of setting. 1 each 0   Ascorbic Acid (VITAMIN C) 1000 MG tablet Take 1 tablet by mouth daily.     Blood Glucose Monitoring Suppl (FREESTYLE LITE) DEVI      Cyanocobalamin (B-12) 500 MCG TABS Take 1 tablet by mouth daily.     Estradiol (VAGIFEM) 10 MCG TABS vaginal tablet Place 1 tablet (10 mcg total) vaginally 2 (two) times a week. Use every night before bed for two weeks when you first begin this medicine, then after the first two weeks, begin using it twice a week. 34 tablet 2   famotidine (PEPCID) 40 MG tablet Take  1 tablet by mouth two times daily. 272 tablet 1   FOLIC ACID PO Take 536 mcg by mouth daily.     glucose blood (FREESTYLE LITE) test strip USE TO TEST BLOOD SUGARS TWICE DAILY (Patient taking differently: USE TO TEST BLOOD SUGARS TWICE DAILY) 400 strip 12   Lancets (FREESTYLE) lancets For use when testing blood sugars daily. DX:E11.69 200 each 12   metFORMIN (GLUCOPHAGE) 1000 MG tablet Take 1 tablet (1,000 mg total) by mouth daily with breakfast. 90 tablet 3   pyridOXINE (VITAMIN B-6) 100 MG tablet Take 100 mg by mouth daily.     rosuvastatin (CRESTOR) 40 MG tablet TAKE 1 TABLET (40 MG TOTAL) BY MOUTH DAILY. 90 tablet 3   valsartan (DIOVAN) 40 MG tablet Take 1 tablet (40 mg total) by mouth daily. 90 tablet 0   Zinc Sulfate (ZINC 15 PO) Take by mouth.     No facility-administered medications prior to visit.    Allergies  Allergen Reactions   Sulfonamide Derivatives Hives    REACTION: hives   Xigduo Xr [Dapagliflozin-Metformin Hcl Er] Hives   Hycodan [Hydrocodone Bit-Homatrop Mbr] Itching   Omeprazole Itching   Cefaclor Rash   Cephalexin Rash   Januvia [Sitagliptin] Nausea Only    Back pain   Levofloxacin Itching and Rash   Penicillins Other (See Comments)    unknown   Tetracycline Nausea Only    GI upset   Victoza [Liraglutide] Other (See Comments)    Back pain    ROS Review of Systems    Objective:    Physical Exam Constitutional:      Appearance: Normal appearance. She is well-developed.  HENT:     Head: Normocephalic and atraumatic.  Cardiovascular:     Rate and Rhythm: Normal rate and regular rhythm.     Heart sounds: Normal heart sounds.  Pulmonary:     Effort: Pulmonary effort is normal.     Breath sounds: Normal breath sounds.  Skin:    General: Skin is warm and dry.  Neurological:     Mental Status: She is alert and oriented to person, place, and time.  Psychiatric:        Behavior: Behavior normal.    BP (!) 139/48 (BP Location: Left Arm)    Pulse 80     Resp 16    Ht 5' (1.524 m)    Wt 161 lb (73 kg)    LMP 01/27/2000 (Approximate) Comment: supracervical hysterectomy  BMI 31.44 kg/m  Wt Readings from Last 3 Encounters:  02/20/21 161 lb (73 kg)  01/23/21 163 lb (73.9 kg)  10/22/20 155 lb (70.3 kg)     Health Maintenance Due  Topic Date Due   Zoster Vaccines- Shingrix (1 of 2) Never done    There are no preventive care reminders to display for this patient.  Lab Results  Component Value Date   TSH 0.96 11/25/2017   Lab Results  Component Value Date   WBC 7.0 06/19/2020   HGB 10.0 (L) 06/19/2020   HCT 31.9 (L) 06/19/2020   MCV 90.1 06/19/2020   PLT 288 06/19/2020   Lab Results  Component Value Date   NA 142 06/19/2020   K 4.1 06/19/2020   CO2 28 06/19/2020   GLUCOSE 121 (H) 06/19/2020   BUN 18 06/19/2020   CREATININE 1.43 (H) 06/19/2020   BILITOT 0.3 06/10/2020   ALKPHOS 76 10/29/2015   AST 16 06/10/2020   ALT 17 06/10/2020   PROT 6.9 06/10/2020   ALBUMIN 4.4 10/29/2015   CALCIUM 9.9 06/19/2020   ANIONGAP 13 01/13/2019   Lab Results  Component Value Date   CHOL 117 06/10/2020   Lab Results  Component Value Date   HDL 44 (L) 06/10/2020   Lab Results  Component Value Date   LDLCALC 51 06/10/2020   Lab Results  Component Value Date   TRIG 138 06/10/2020   Lab Results  Component Value Date   CHOLHDL 2.7 06/10/2020   Lab Results  Component Value Date   HGBA1C 6.8 (A) 02/20/2021      Assessment & Plan:   Problem List Items Addressed This Visit       Cardiovascular and Mediastinum   HYPERTENSION, BENIGN SYSTEMIC - Primary    Systolic blood pressure still little bit elevated today but her diastolic is actually low some a little hesitant to make an adjustment to her regimen we will just continue to monitor for now.        Digestive   Fatty liver    To need to follow liver function periodically.        Endocrine   Type 2 diabetes mellitus with other specified complication (HCC)    V9D  up just slightly from previous but overall well controlled.  Continue to work on healthy diet and regular exercise she is just on metformin currently.  She is on an ARB and a statin.      Relevant Orders   POCT HgB A1C (Completed)     Other   Atypical chest pain    She did have her stress test yesterday and has not heard back about the results.  It looks like everything was actually pretty reassuring overall.  She did have some slight EKG changes and they just recommended maximizing control of her risk factors.  We discussed that this includes just making sure that her blood pressures well controlled, diabetes, cholesterol etc.  She will be due for repeat lipid panel in May.       No orders of the defined types were placed in this encounter.   Follow-up: Return in about 4 months (around 06/20/2021) for Diabetes, Hypertenion follow up .    Beatrice Lecher, MD

## 2021-02-20 NOTE — Assessment & Plan Note (Signed)
A1c up just slightly from previous but overall well controlled.  Continue to work on healthy diet and regular exercise she is just on metformin currently.  She is on an ARB and a statin.

## 2021-02-20 NOTE — Assessment & Plan Note (Signed)
Systolic blood pressure still little bit elevated today but her diastolic is actually low some a little hesitant to make an adjustment to her regimen we will just continue to monitor for now.

## 2021-02-26 ENCOUNTER — Other Ambulatory Visit (HOSPITAL_BASED_OUTPATIENT_CLINIC_OR_DEPARTMENT_OTHER): Payer: Self-pay

## 2021-02-27 ENCOUNTER — Ambulatory Visit (INDEPENDENT_AMBULATORY_CARE_PROVIDER_SITE_OTHER): Payer: 59 | Admitting: Obstetrics and Gynecology

## 2021-02-27 ENCOUNTER — Other Ambulatory Visit: Payer: Self-pay

## 2021-02-27 ENCOUNTER — Ambulatory Visit (INDEPENDENT_AMBULATORY_CARE_PROVIDER_SITE_OTHER): Payer: 59

## 2021-02-27 ENCOUNTER — Encounter: Payer: Self-pay | Admitting: Obstetrics and Gynecology

## 2021-02-27 VITALS — BP 114/68 | HR 80 | Ht 60.0 in | Wt 163.0 lb

## 2021-02-27 DIAGNOSIS — N882 Stricture and stenosis of cervix uteri: Secondary | ICD-10-CM | POA: Diagnosis not present

## 2021-02-27 DIAGNOSIS — N952 Postmenopausal atrophic vaginitis: Secondary | ICD-10-CM | POA: Diagnosis not present

## 2021-02-27 DIAGNOSIS — R102 Pelvic and perineal pain: Secondary | ICD-10-CM | POA: Diagnosis not present

## 2021-02-27 NOTE — Progress Notes (Signed)
GYNECOLOGY  VISIT   HPI: 61 y.o.   Divorced  Caucasian  female   [phone] with Patient's last menstrual period was 01/27/2000 (approximate).   here for pelvic ultrasound to check ovaries.  Had an episode of pelvic pressure in December that felt like an ovarian cyst rupture.   She has had greenish brown discharge.  She has seen some red drainage also.   Has some recurrent cramping.   Just started using the Vagifem.   No bladder concerns or symptoms.   GYNECOLOGIC HISTORY: Patient's last menstrual period was 01/27/2000 (approximate). Contraception:  Supracervical Hyst Menopausal hormone therapy:  Vagifem Last mammogram:  10-02-20 category b density birads 1:neg Last pap smear:    08-19-20 neg HPV HR +, 07-11-18 neg HPV H R neg, 03-09-17 neg HPV HR neg        OB History     Gravida  3   Para  2   Term  2   Preterm      AB  1   Living  2      SAB  1   IAB      Ectopic      Multiple      Live Births                 Patient Active Problem List   Diagnosis Date Noted   CKD (chronic kidney disease) stage 3, GFR 30-59 ml/min ([place]) 06/26/2020   Morbid obesity ([place]) 05/30/2020   Screening for malignant neoplasm of colon 05/30/2020   Gastroesophageal reflux disease without esophagitis 05/16/2020   Atypical chest pain 05/16/2020   Normocytic anemia 05/08/2020   Fatty liver 05/01/2020   Decreased appetite 04/22/2020   Non-intractable vomiting 04/22/2020   OSA (obstructive sleep apnea) 02/08/2020   Plantar fasciitis, bilateral 09/19/2019   Pain in right hand 02/21/2018   Bursitis of left shoulder 01/24/2016   GAD (generalized anxiety disorder) 04/02/2015   Localized primary osteoarthritis of carpometacarpal joint of right thumb 03/15/2015   Carpal tunnel syndrome, bilateral 04/05/2013   LGSIL (low grade squamous intraepithelial dysplasia) 06/16/2012   Type 2 diabetes mellitus with other specified complication ([place]) 93/71/6967   PANIC ATTACK 09/07/2008    RESTLESS LEG SYNDROME 04/02/2008   POLYARTHRITIS 04/02/2008   Primary osteoarthritis of left knee 01/02/2008   HOT FLASHES 08/04/2006   Hyperlipidemia associated with type 2 diabetes mellitus ([place]) 11/03/2005   HYPERTENSION, BENIGN SYSTEMIC 11/03/2005    Past Medical History:  Diagnosis Date   Abnormal ultrasound of ovary 2016   Ultrasound showing bilateral ovaries in contact with one another.  Asymptomatic.   Allergy    Anemia    Diabetes mellitus    borderline   Fibroid    Heart murmur    Hidradenitis    High cholesterol    Hypertension    Sleep apnea     Past Surgical History:  Procedure Laterality Date   BREAST EXCISIONAL BIOPSY Right    BREAST SURGERY     cysts removed right breast   BREAST SURGERY  08/2018   benign mass   CARPAL TUNNEL RELEASE Right 01/08/2017   Procedure: CARPAL TUNNEL RELEASE, POSSIBLE RIGHT THUMB CARPOMETACARPAL INJECTION;  Surgeon: Roseanne Kaufman, MD;  Location: [facility];  Service: Orthopedics;  Laterality: Right;  [place] HIDRADENITIS EXCISION     LEEP  05/04/2013   HPV change   SUPRACERVICAL ABDOMINAL HYSTERECTOMY  2002   Done for bleeding,  fibroids in [place]    Current Outpatient Medications  Medication Sig Dispense Refill   AMBULATORY NON FORMULARY MEDICATION Set CPAP to 10 cm water pressure. Need download in 2 weeks after change of setting. 1 each 0   Ascorbic Acid (VITAMIN C) 1000 MG tablet Take 1 tablet by mouth daily.     Blood Glucose Monitoring Suppl (FREESTYLE LITE) DEVI      Cyanocobalamin (B-12) 500 MCG TABS Take 1 tablet by mouth daily.     Estradiol (VAGIFEM) 10 MCG TABS vaginal tablet Place 1 tablet (10 mcg total) vaginally 2 (two) times a week. Use every night before bed for two weeks when you first begin this medicine, then after the first two weeks, begin using it twice a week. 34 tablet 2   famotidine (PEPCID) 40 MG tablet Take 1 tablet by mouth two times daily. 762 tablet 1    FOLIC ACID PO Take 831 mcg by mouth daily.     glucose blood (FREESTYLE LITE) test strip USE TO TEST BLOOD SUGARS TWICE DAILY (Patient taking differently: USE TO TEST BLOOD SUGARS TWICE DAILY) 400 strip 12   Lancets (FREESTYLE) lancets For use when testing blood sugars daily. DX:E11.69 200 each 12   metFORMIN (GLUCOPHAGE) 1000 MG tablet Take 1 tablet (1,000 mg total) by mouth daily with breakfast. 90 tablet 3   pyridOXINE (VITAMIN B-6) 100 MG tablet Take 100 mg by mouth daily.     rosuvastatin (CRESTOR) 40 MG tablet TAKE 1 TABLET (40 MG TOTAL) BY MOUTH DAILY. 90 tablet 3   valsartan (DIOVAN) 40 MG tablet Take 1 tablet (40 mg total) by mouth daily. 90 tablet 0   Zinc Sulfate (ZINC 15 PO) Take by mouth.     No current facility-administered medications for this visit.     ALLERGIES: Sulfonamide derivatives, Xigduo xr [dapagliflozin-metformin hcl er], Hycodan [hydrocodone bit-homatrop mbr], Omeprazole, Cefaclor, Cephalexin, Januvia [sitagliptin], Levofloxacin, Penicillins, Tetracycline, and Victoza [liraglutide]  Family History  Problem Relation Age of Onset   Asthma Mother    COPD Mother    Diabetes Mother    Hypertension Mother    Diabetes Father    Hypertension Father    Breast cancer Maternal Aunt    Breast cancer Paternal Aunt    Colon cancer Neg Hx    Pancreatic cancer Neg Hx    Esophageal cancer Neg Hx    Stomach cancer Neg Hx    Liver disease Neg Hx     Social History   Socioeconomic History   Marital status: Divorced    Spouse name: Not on file   Number of children: Not on file   Years of education: Not on file   Highest education level: Not on file  Occupational History   Not on file  Tobacco Use   Smoking status: Never   Smokeless tobacco: Never  Vaping Use   Vaping Use: Never used  Substance and Sexual Activity   Alcohol use: No    Alcohol/week: 0.0 standard drinks   Drug use: No   Sexual activity: Not Currently    Partners: Male    Birth  control/protection: Surgical    Comment: TAH/supracervical  Other Topics Concern   Not on file  Social History Narrative   Not on file   Social Determinants of Health   Financial Resource Strain: Not on file  Food Insecurity: Not on file  Transportation Needs: Not on file  Physical Activity: Not on file  Stress: Not on file  Social Connections: Not  on file  Intimate Partner Violence: Not on file    Review of Systems  All other systems reviewed and are negative.  PHYSICAL EXAMINATION:    BP 114/68    Pulse 80    Ht 5' (1.524 m)    Wt 163 lb (73.9 kg)    LMP 01/27/2000 (Approximate) Comment: supracervical hysterectomy   SpO2 98%    BMI 31.83 kg/m     General appearance: alert, cooperative and appears stated age   Pelvic: External genitalia:  no lesions              Urethra:  normal appearing urethra with no masses, tenderness or lesions              Bartholins and Skenes: normal                 Vagina: normal appearing vagina with normal color and discharge, no lesions.  Generalized erythema of the vaginal mucosa and mucousy green tinged discharge.               Cervix: stenosis of the external os.                  Bimanual Exam:  Uterus:  absent                           Adnexa: no mass, fullness, tenderness       Chaperone was present for exam:  Estill Bamberg, CMA  Pelvic US:  uterus absent.  Cervix with simple fluid in the canal.   Bilateral ovaries atrophic.  Right ovary in right adnexal region.  Left ovary midline.  No adnexal masses.  No free fluid.   Bladder with some generalized irregular contour of the wall.  Normal urethra.  ASSESSMENT  Pelvic cramping.  Cervical stenosis.  Normal pap and positive HR HPV.  Vaginal atrophy.   PLAN  Pelvic ultrasound report and images reviewed.  She will do a 6 week course of the Vagifem and then return for cervical dilation and pap.  Rationale explained.   An After Visit Summary was printed and given to the patient.  33  min  total time was spent for this patient encounter, including preparation, face-to-face counseling with the patient, coordination of care, and documentation of the encounter.

## 2021-02-28 DIAGNOSIS — G4733 Obstructive sleep apnea (adult) (pediatric): Secondary | ICD-10-CM | POA: Diagnosis not present

## 2021-03-02 ENCOUNTER — Telehealth: Payer: Self-pay | Admitting: Obstetrics and Gynecology

## 2021-03-02 NOTE — Telephone Encounter (Signed)
Please precert office procedure of cervical dilation.   Patient has a stenotic cervix, fluid in the cervix, and positive HR HPV.   I will need to dilate her cervix and collect a pap.

## 2021-03-05 ENCOUNTER — Encounter: Payer: Self-pay | Admitting: Family Medicine

## 2021-03-05 DIAGNOSIS — I10 Essential (primary) hypertension: Secondary | ICD-10-CM | POA: Diagnosis not present

## 2021-03-05 DIAGNOSIS — U071 COVID-19: Secondary | ICD-10-CM

## 2021-03-05 DIAGNOSIS — Z20822 Contact with and (suspected) exposure to covid-19: Secondary | ICD-10-CM | POA: Diagnosis not present

## 2021-03-05 HISTORY — DX: COVID-19: U07.1

## 2021-03-06 ENCOUNTER — Telehealth (INDEPENDENT_AMBULATORY_CARE_PROVIDER_SITE_OTHER): Payer: 59 | Admitting: Family Medicine

## 2021-03-06 DIAGNOSIS — U071 COVID-19: Secondary | ICD-10-CM

## 2021-03-06 NOTE — Progress Notes (Signed)
° ° °  Virtual Visit via Video Note  I connected with Maria Nunez on 03/06/21 at  4:20 PM EST by a video enabled telemedicine application and verified that I am speaking with the correct person using two identifiers.   I discussed the limitations of evaluation and management by telemedicine and the availability of in person appointments. The patient expressed understanding and agreed to proceed.  Patient location: at home Provider location: in office  Subjective:    CC:   Chief Complaint  Patient presents with   Covid Positive    HPI: Sinus HA over the weekend on Sat. Then on Mon 100.5 fever.then  bodyaches for 2 days.  Past 2 days  of congestion and ST No chest sxs. No nausea.  Some indigestion.  Taking [place] and at night.  Tylenol for HA> Cough is keeping her   Risk Factors: DM, HTN, BMI, age, CKD  Past medical history, Surgical history, Family history not pertinant except as noted below, Social history, Allergies, and medications have been entered into the medical record, reviewed, and corrections made.    Objective:    General: Speaking clearly in complete sentences without any shortness of breath.  Alert and oriented x3.  Normal judgment. No apparent acute distress.    Impression and Recommendations:    Problem List Items Addressed This Visit   None Visit Diagnoses     COVID-19    -  Primary      COVID-19-discussed diagnosis.  Discussed current recommendations for quarantine guidelines as well as symptomatic care.  Discussed option of Paxlovid since she is moderate to high risk based on current risk factors.  Overall she feels like she is doing okay she is no longer having fever she is struggling more with the nasal congestion.  She said she will try to get some fluticasone over-the-counter we discussed that this is typical of what worsening with COVID it feels like a sinus infection and sometimes will have more chest symptoms but not always.  If she is not  feeling better after the weekend then please let us know.  She has been able to work from home which is good.  Call if not improving, new or worsening symptoms, or just not better in 1 week.  No orders of the defined types were placed in this encounter.   No orders of the defined types were placed in this encounter.    I discussed the assessment and treatment plan with the patient. The patient was provided an opportunity to ask questions and all were answered. The patient agreed with the plan and demonstrated an understanding of the instructions.   The patient was advised to call back or seek an in-person evaluation if the symptoms worsen or if the condition fails to improve as anticipated.   Beatrice Lecher, MD

## 2021-03-07 ENCOUNTER — Telehealth: Payer: 59 | Admitting: Family Medicine

## 2021-03-10 NOTE — Telephone Encounter (Signed)
Precertification not required.  Encounter closed.

## 2021-03-13 ENCOUNTER — Encounter: Payer: Self-pay | Admitting: Family Medicine

## 2021-03-14 ENCOUNTER — Other Ambulatory Visit (HOSPITAL_BASED_OUTPATIENT_CLINIC_OR_DEPARTMENT_OTHER): Payer: Self-pay

## 2021-03-14 MED ORDER — AZITHROMYCIN 250 MG PO TABS
ORAL_TABLET | ORAL | 0 refills | Status: AC
  Filled 2021-03-14: qty 6, 5d supply, fill #0

## 2021-03-26 DIAGNOSIS — M542 Cervicalgia: Secondary | ICD-10-CM | POA: Diagnosis not present

## 2021-03-26 DIAGNOSIS — M47816 Spondylosis without myelopathy or radiculopathy, lumbar region: Secondary | ICD-10-CM | POA: Diagnosis not present

## 2021-03-26 DIAGNOSIS — M62830 Muscle spasm of back: Secondary | ICD-10-CM | POA: Diagnosis not present

## 2021-03-26 DIAGNOSIS — M47813 Spondylosis without myelopathy or radiculopathy, cervicothoracic region: Secondary | ICD-10-CM | POA: Diagnosis not present

## 2021-03-31 NOTE — Progress Notes (Unsigned)
GYNECOLOGY  VISIT   HPI: 61 y.o.   Divorced  Caucasian  female   [phone] with Patient's last menstrual period was 01/27/2000 (approximate).   here for     GYNECOLOGIC HISTORY: Patient's last menstrual period was 01/27/2000 (approximate). Contraception:  *** Menopausal hormone therapy:  *** Last mammogram:  10-02-20 normal Last pap smear:   08-19-20 Pos HR HPV        OB History     Gravida  3   Para  2   Term  2   Preterm      AB  1   Living  2      SAB  1   IAB      Ectopic      Multiple      Live Births                 Patient Active Problem List   Diagnosis Date Noted   CKD (chronic kidney disease) stage 3, GFR 30-59 ml/min ([place]) 06/26/2020   Morbid obesity ([place]) 05/30/2020   Screening for malignant neoplasm of colon 05/30/2020   Gastroesophageal reflux disease without esophagitis 05/16/2020   Atypical chest pain 05/16/2020   Normocytic anemia 05/08/2020   Fatty liver 05/01/2020   Decreased appetite 04/22/2020   Non-intractable vomiting 04/22/2020   OSA (obstructive sleep apnea) 02/08/2020   Plantar fasciitis, bilateral 09/19/2019   Pain in right hand 02/21/2018   Bursitis of left shoulder 01/24/2016   GAD (generalized anxiety disorder) 04/02/2015   Localized primary osteoarthritis of carpometacarpal joint of right thumb 03/15/2015   Carpal tunnel syndrome, bilateral 04/05/2013   LGSIL (low grade squamous intraepithelial dysplasia) 06/16/2012   Type 2 diabetes mellitus with other specified complication ([place]) 44/96/7591   PANIC ATTACK 09/07/2008   RESTLESS LEG SYNDROME 04/02/2008   POLYARTHRITIS 04/02/2008   Primary osteoarthritis of left knee 01/02/2008   HOT FLASHES 08/04/2006   Hyperlipidemia associated with type 2 diabetes mellitus ([place]) 11/03/2005   HYPERTENSION, BENIGN SYSTEMIC 11/03/2005    Past Medical History:  Diagnosis Date   Abnormal ultrasound of ovary 2016   Ultrasound showing bilateral ovaries in contact with one another.   Asymptomatic.   Allergy    Anemia    Diabetes mellitus    borderline   Fibroid    Heart murmur    Hidradenitis    High cholesterol    Hypertension    Sleep apnea     Past Surgical History:  Procedure Laterality Date   BREAST EXCISIONAL BIOPSY Right    BREAST SURGERY     cysts removed right breast   BREAST SURGERY  08/2018   benign mass   CARPAL TUNNEL RELEASE Right 01/08/2017   Procedure: CARPAL TUNNEL RELEASE, POSSIBLE RIGHT THUMB CARPOMETACARPAL INJECTION;  Surgeon: Roseanne Kaufman, MD;  Location: [facility];  Service: Orthopedics;  Laterality: Right;  [place] HIDRADENITIS EXCISION     LEEP  05/04/2013   HPV change   SUPRACERVICAL ABDOMINAL HYSTERECTOMY  2002   Done for bleeding, fibroids in [place]    Current Outpatient Medications  Medication Sig Dispense Refill   AMBULATORY NON FORMULARY MEDICATION Set CPAP to 10 cm water pressure. Need download in 2 weeks after change of setting. 1 each 0   Ascorbic Acid (VITAMIN C) 1000 MG tablet Take 1 tablet by mouth daily.     Blood Glucose Monitoring Suppl (FREESTYLE LITE) DEVI      Cyanocobalamin (B-12) 500  MCG TABS Take 1 tablet by mouth daily.     Estradiol (VAGIFEM) 10 MCG TABS vaginal tablet Place 1 tablet (10 mcg total) vaginally 2 (two) times a week. Use every night before bed for two weeks when you first begin this medicine, then after the first two weeks, begin using it twice a week. 34 tablet 2   famotidine (PEPCID) 40 MG tablet Take 1 tablet by mouth two times daily. 188 tablet 1   FOLIC ACID PO Take 416 mcg by mouth daily.     glucose blood (FREESTYLE LITE) test strip USE TO TEST BLOOD SUGARS TWICE DAILY (Patient taking differently: USE TO TEST BLOOD SUGARS TWICE DAILY) 400 strip 12   Lancets (FREESTYLE) lancets For use when testing blood sugars daily. DX:E11.69 200 each 12   metFORMIN (GLUCOPHAGE) 1000 MG tablet Take 1 tablet (1,000 mg total) by mouth daily with breakfast. 90  tablet 3   pyridOXINE (VITAMIN B-6) 100 MG tablet Take 100 mg by mouth daily.     rosuvastatin (CRESTOR) 40 MG tablet TAKE 1 TABLET (40 MG TOTAL) BY MOUTH DAILY. 90 tablet 3   valsartan (DIOVAN) 40 MG tablet Take 1 tablet (40 mg total) by mouth daily. 90 tablet 0   Zinc Sulfate (ZINC 15 PO) Take by mouth.     No current facility-administered medications for this visit.     ALLERGIES: Sulfonamide derivatives, Xigduo xr [dapagliflozin-metformin hcl er], Hycodan [hydrocodone bit-homatrop mbr], Omeprazole, Cefaclor, Cephalexin, Januvia [sitagliptin], Levofloxacin, Penicillins, Tetracycline, and Victoza [liraglutide]  Family History  Problem Relation Age of Onset   Asthma Mother    COPD Mother    Diabetes Mother    Hypertension Mother    Diabetes Father    Hypertension Father    Breast cancer Maternal Aunt    Breast cancer Paternal Aunt    Colon cancer Neg Hx    Pancreatic cancer Neg Hx    Esophageal cancer Neg Hx    Stomach cancer Neg Hx    Liver disease Neg Hx     Social History   Socioeconomic History   Marital status: Divorced    Spouse name: Not on file   Number of children: Not on file   Years of education: Not on file   Highest education level: Not on file  Occupational History   Not on file  Tobacco Use   Smoking status: Never   Smokeless tobacco: Never  Vaping Use   Vaping Use: Never used  Substance and Sexual Activity   Alcohol use: No    Alcohol/week: 0.0 standard drinks   Drug use: No   Sexual activity: Not Currently    Partners: Male    Birth control/protection: Surgical    Comment: TAH/supracervical  Other Topics Concern   Not on file  Social History Narrative   Not on file   Social Determinants of Health   Financial Resource Strain: Not on file  Food Insecurity: Not on file  Transportation Needs: Not on file  Physical Activity: Not on file  Stress: Not on file  Social Connections: Not on file  Intimate Partner Violence: Not on file     Review of Systems  PHYSICAL EXAMINATION:    LMP 01/27/2000 (Approximate) Comment: supracervical hysterectomy    General appearance: alert, cooperative and appears stated age Head: Normocephalic, without obvious abnormality, atraumatic Neck: no adenopathy, supple, symmetrical, trachea midline and thyroid normal to inspection and palpation Lungs: clear to auscultation bilaterally Breasts: normal appearance, no masses or tenderness, No nipple retraction  or dimpling, No nipple discharge or bleeding, No axillary or supraclavicular adenopathy Heart: regular rate and rhythm Abdomen: soft, non-tender, no masses,  no organomegaly Extremities: extremities normal, atraumatic, no cyanosis or edema Skin: Skin color, texture, turgor normal. No rashes or lesions Lymph nodes: Cervical, supraclavicular, and axillary nodes normal. No abnormal inguinal nodes palpated Neurologic: Grossly normal  Pelvic: External genitalia:  no lesions              Urethra:  normal appearing urethra with no masses, tenderness or lesions              Bartholins and Skenes: normal                 Vagina: normal appearing vagina with normal color and discharge, no lesions              Cervix: no lesions                Bimanual Exam:  Uterus:  normal size, contour, position, consistency, mobility, non-tender              Adnexa: no mass, fullness, tenderness              Rectal exam: {yes no:[id number]}.  Confirms.              Anus:  normal sphincter tone, no lesions  Chaperone was present for exam:  ***  ASSESSMENT     PLAN     An After Visit Summary was printed and given to the patient.  ______ minutes face to face time of which over 50% was spent in counseling.

## 2021-04-11 ENCOUNTER — Ambulatory Visit: Payer: 59 | Admitting: Obstetrics and Gynecology

## 2021-04-11 ENCOUNTER — Other Ambulatory Visit: Payer: Self-pay

## 2021-04-11 VITALS — BP 124/76

## 2021-04-11 DIAGNOSIS — Z124 Encounter for screening for malignant neoplasm of cervix: Secondary | ICD-10-CM | POA: Diagnosis not present

## 2021-04-11 DIAGNOSIS — N882 Stricture and stenosis of cervix uteri: Secondary | ICD-10-CM

## 2021-04-11 DIAGNOSIS — R87810 Cervical high risk human papillomavirus (HPV) DNA test positive: Secondary | ICD-10-CM

## 2021-04-12 ENCOUNTER — Encounter: Payer: Self-pay | Admitting: Obstetrics and Gynecology

## 2021-04-16 LAB — CYTOLOGY - PAP
Adequacy: ABNORMAL
Comment: NEGATIVE

## 2021-04-18 ENCOUNTER — Encounter: Payer: Self-pay | Admitting: Obstetrics and Gynecology

## 2021-04-18 NOTE — Telephone Encounter (Signed)
This patient's history is complicated. I think we should wait until Monday and let Dr Quincy Simmonds make a recommendation.  ?

## 2021-04-21 ENCOUNTER — Telehealth: Payer: Self-pay

## 2021-04-21 NOTE — Telephone Encounter (Signed)
FYI.  ?Followed up with pt regarding pap results and recommendations. Also re: if she had any questions or concerns and if she had a preference as far as the location she would appreciate having procedure done. She reported she would prefer at an outpatient surgical setting. Also only had questions as far as downtime to plan for for work. Please advise, thanks.  ?

## 2021-04-22 NOTE — Telephone Encounter (Signed)
Please proceed with precert for LEEP procedure in at [facility].  ? ?My patient has positive HR HPV, cervical stenosis, and inability to perform an adequate pap due to her cervical stenosis.   ? ?I will need about 45 minutes of OR time.   ? ?Her recovery time will be the day of surgery and the following day, to recover from anesthesia.  ? ?Please let her know of this recovery time, which she has inquired about.  ? ?She will need a preop visit with me.  ?

## 2021-04-24 NOTE — Telephone Encounter (Signed)
Left message to call Silvina Hackleman, RN at [facility], [phone], OPT 5.  

## 2021-04-24 NOTE — Telephone Encounter (Signed)
Spoke with patient. Reviewed surgery dates. Patient request to proceed with surgery on 05/19/21.  Advised patient I will forward to business office for return call. I will return call once surgery date and time confirmed. Patient verbalizes understanding and is agreeable.  ? ?Surgery request sent.  ? ?Routing to [facility] ? ?

## 2021-04-25 ENCOUNTER — Other Ambulatory Visit: Payer: Self-pay | Admitting: Family Medicine

## 2021-04-25 DIAGNOSIS — I10 Essential (primary) hypertension: Secondary | ICD-10-CM

## 2021-04-25 MED ORDER — VALSARTAN 40 MG PO TABS
40.0000 mg | ORAL_TABLET | Freq: Every day | ORAL | 3 refills | Status: DC
  Filled 2021-05-05: qty 90, 90d supply, fill #0
  Filled 2021-08-04: qty 90, 90d supply, fill #1
  Filled 2021-11-20: qty 90, 90d supply, fill #2
  Filled 2022-02-24: qty 90, 90d supply, fill #3

## 2021-04-28 NOTE — Telephone Encounter (Signed)
See telephone encounter dated 04/21/21.  ?Spoke with patient, questions answered.  ? ?Encounter closed.  ?

## 2021-04-28 NOTE — Progress Notes (Signed)
GYNECOLOGY  VISIT ?  ?HPI: ?61 y.o.   Divorced  Caucasian  female   ?[id number] with Patient's last menstrual period was 01/27/2000 (approximate).   ?here for surgicial consultation for outpatient LEEP procedure. ? ?Patient has had a supracervical hysterectomy.  ?Patient had cervical cancer screening 08/19/20 at another office and she had positive HR HPV.  ?Cytology negative.   ?She has know cervical stenosis and hx LEEP for recurrent abnormal paps and unsatisfactory colposcopy. ?Her final pathology on LEEP showed HPV change and no evidence of high grade dysplasia.  ?  ?She does have pelvic cramping which lead to a pelvic US on 02/27/21.  ?She states the cramping is like a menstrual period. ?She was noted to have simple fluid in the cervical canal and her bilateral ovaries were atrophic.  The right ovary is in the right adnexal region and the left ovary is midline, so they are next to each other.  (This changes has been previously seen on pelvic US.)  ?  ?She developed bloating with use of the vaginal estrogen tablets to treat atrophy, so she stopped this.  ? ?In office on 04/11/21, she had attempted cervical dilation of her stenotic cervix to obtain endocervical cells with a pap.  ?A pap at that time was unsatisfactory.  ? ?GYNECOLOGIC HISTORY: ?Patient's last menstrual period was 01/27/2000 (approximate). ?Contraception: TAH/Supracervical  ?Menopausal hormone therapy: vagifem only ?Last mammogram: 10/02/20-neg birads 1 ?Last pap smear: 04/11/21-unsatisfactory, 08/19/20-WNL, HR HPV+, 07/11/18-WNL, HPV- neg.  ?       ?OB History   ? ? Gravida  ?3  ? Para  ?2  ? Term  ?2  ? Preterm  ?   ? AB  ?1  ? Living  ?2  ?  ? ? SAB  ?1  ? IAB  ?   ? Ectopic  ?   ? Multiple  ?   ? Live Births  ?   ?   ?  ?  ?    ? ?Patient Active Problem List  ? Diagnosis Date Noted  ? CKD (chronic kidney disease) stage 3, GFR 30-59 ml/min (HCC) 06/26/2020  ? Morbid obesity ([place]) 05/30/2020  ? Screening for malignant neoplasm of colon 05/30/2020  ?  Gastroesophageal reflux disease without esophagitis 05/16/2020  ? Atypical chest pain 05/16/2020  ? Normocytic anemia 05/08/2020  ? Fatty liver 05/01/2020  ? Decreased appetite 04/22/2020  ? Non-intractable vomiting 04/22/2020  ? OSA (obstructive sleep apnea) 02/08/2020  ? Plantar fasciitis, bilateral 09/19/2019  ? Pain in right hand 02/21/2018  ? Bursitis of left shoulder 01/24/2016  ? GAD (generalized anxiety disorder) 04/02/2015  ? Localized primary osteoarthritis of carpometacarpal joint of right thumb 03/15/2015  ? Carpal tunnel syndrome, bilateral 04/05/2013  ? LGSIL (low grade squamous intraepithelial dysplasia) 06/16/2012  ? Type 2 diabetes mellitus with other specified complication ([place]) 67/67/2094  ? PANIC ATTACK 09/07/2008  ? RESTLESS LEG SYNDROME 04/02/2008  ? POLYARTHRITIS 04/02/2008  ? Primary osteoarthritis of left knee 01/02/2008  ? HOT FLASHES 08/04/2006  ? Hyperlipidemia associated with type 2 diabetes mellitus ([place]) 11/03/2005  ? HYPERTENSION, BENIGN SYSTEMIC 11/03/2005  ? ? ?Past Medical History:  ?Diagnosis Date  ? Abnormal ultrasound of ovary 2016  ? Ultrasound showing bilateral ovaries in contact with one another.  Asymptomatic.  ? Allergy   ? Anemia   ? Diabetes mellitus   ? borderline  ? Fibroid   ? Heart murmur   ? Hidradenitis   ? High cholesterol   ? Hypertension   ?  Sleep apnea   ? ? ?Past Surgical History:  ?Procedure Laterality Date  ? BREAST EXCISIONAL BIOPSY Right   ? BREAST SURGERY    ? cysts removed right breast  ? BREAST SURGERY  08/2018  ? benign mass  ? CARPAL TUNNEL RELEASE Right 01/08/2017  ? Procedure: CARPAL TUNNEL RELEASE, POSSIBLE RIGHT THUMB CARPOMETACARPAL INJECTION;  Surgeon: Roseanne Kaufman, MD;  Location: [facility];  Service: Orthopedics;  Laterality: Right;  60 MINS  ? CESAREAN SECTION    ? INGUINAL HIDRADENITIS EXCISION    ? LEEP  05/04/2013  ? HPV change  ? SUPRACERVICAL ABDOMINAL HYSTERECTOMY  2002  ? Done for bleeding, fibroids in [place]  ? ? ?Current  Outpatient Medications  ?Medication Sig Dispense Refill  ? AMBULATORY NON FORMULARY MEDICATION Set CPAP to 10 cm water pressure. Need download in 2 weeks after change of setting. 1 each 0  ? Ascorbic Acid (VITAMIN C) 1000 MG tablet Take 1 tablet by mouth daily.    ? Blood Glucose Monitoring Suppl (FREESTYLE LITE) DEVI     ? Cyanocobalamin (B-12) 500 MCG TABS Take 1 tablet by mouth daily.    ? Estradiol (VAGIFEM) 10 MCG TABS vaginal tablet Place 1 tablet (10 mcg total) vaginally 2 (two) times a week. Use every night before bed for two weeks when you first begin this medicine, then after the first two weeks, begin using it twice a week. 34 tablet 2  ? famotidine (PEPCID) 40 MG tablet Take 1 tablet by mouth two times daily. 180 tablet 1  ? FOLIC ACID PO Take 914 mcg by mouth daily.    ? glucose blood (FREESTYLE LITE) test strip USE TO TEST BLOOD SUGARS TWICE DAILY (Patient taking differently: USE TO TEST BLOOD SUGARS TWICE DAILY) 400 strip 12  ? Lancets (FREESTYLE) lancets For use when testing blood sugars daily. DX:E11.69 200 each 12  ? metFORMIN (GLUCOPHAGE) 1000 MG tablet Take 1 tablet (1,000 mg total) by mouth daily with breakfast. 90 tablet 3  ? pyridOXINE (VITAMIN B-6) 100 MG tablet Take 100 mg by mouth daily.    ? rosuvastatin (CRESTOR) 40 MG tablet TAKE 1 TABLET (40 MG TOTAL) BY MOUTH DAILY. 90 tablet 3  ? valsartan (DIOVAN) 40 MG tablet Take 1 tablet (40 mg total) by mouth daily. 90 tablet 3  ? Zinc Sulfate (ZINC 15 PO) Take by mouth.    ? ?No current facility-administered medications for this visit.  ?  ? ?ALLERGIES: Sulfonamide derivatives, Xigduo xr [dapagliflozin-metformin hcl er], Hycodan [hydrocodone bit-homatrop mbr], Omeprazole, Cefaclor, Cephalexin, Januvia [sitagliptin], Levofloxacin, Penicillins, Tetracycline, and Victoza [liraglutide] ? ?Family History  ?Problem Relation Age of Onset  ? Asthma Mother   ? COPD Mother   ? Diabetes Mother   ? Hypertension Mother   ? Diabetes Father   ? Hypertension  Father   ? Breast cancer Maternal Aunt   ? Breast cancer Paternal Aunt   ? Colon cancer Neg Hx   ? Pancreatic cancer Neg Hx   ? Esophageal cancer Neg Hx   ? Stomach cancer Neg Hx   ? Liver disease Neg Hx   ? ? ?Social History  ? ?Socioeconomic History  ? Marital status: Divorced  ?  Spouse name: Not on file  ? Number of children: Not on file  ? Years of education: Not on file  ? Highest education level: Not on file  ?Occupational History  ? Not on file  ?Tobacco Use  ? Smoking status: Never  ? Smokeless  tobacco: Never  ?Vaping Use  ? Vaping Use: Never used  ?Substance and Sexual Activity  ? Alcohol use: No  ?  Alcohol/week: 0.0 standard drinks  ? Drug use: No  ? Sexual activity: Not Currently  ?  Partners: Male  ?  Birth control/protection: Surgical  ?  Comment: TAH/supracervical  ?Other Topics Concern  ? Not on file  ?Social History Narrative  ? Not on file  ? ?Social Determinants of Health  ? ?Financial Resource Strain: Not on file  ?Food Insecurity: Not on file  ?Transportation Needs: Not on file  ?Physical Activity: Not on file  ?Stress: Not on file  ?Social Connections: Not on file  ?Intimate Partner Violence: Not on file  ? ? ?Review of Systems  See HPI. ? ?PHYSICAL EXAMINATION:   ? ?BP 132/82   Pulse 81   LMP 01/27/2000 (Approximate) Comment: supracervical hysterectomy  SpO2 94%     ?General appearance: alert, cooperative and appears stated age ?Head: Normocephalic, without obvious abnormality, atraumatic ?Lungs: clear to auscultation bilaterally ?Heart: regular rate and rhythm ?Abdomen: soft, non-tender, no masses,  no organomegaly ?Extremities: extremities normal, atraumatic, no cyanosis or edema ?Skin: Skin color, texture, turgor normal. No rashes or lesions ?No abnormal inguinal nodes palpated ?Neurologic: Grossly normal ? ?Pelvic: External genitalia:  no lesions ?             Urethra:  normal appearing urethra with no masses, tenderness or lesions ?             Bartholins and Skenes: normal    ?              Vagina:  atrophy noted.  ?             Cervix: no lesions ?               ?Bimanual Exam:  Uterus:  absent ?             Adnexa: no mass, fullness, tenderness ?        ?Chaperone was present for exam:  Patty Sermons

## 2021-04-28 NOTE — Telephone Encounter (Signed)
Spoke with patient. Surgery date request confirmed.  ?Advised surgery is scheduled for 05/19/21, [facility] at 1115.  ?Surgery instruction sheet and hospital brochure reviewed, printed copy will be mailed.  ?Patient verbalizes understanding and is agreeable.  ?Routing to [facility] for benefits, encounter may be closed after benefits reviewed.  ? ? ?

## 2021-04-29 DIAGNOSIS — M47813 Spondylosis without myelopathy or radiculopathy, cervicothoracic region: Secondary | ICD-10-CM | POA: Diagnosis not present

## 2021-04-29 DIAGNOSIS — M47816 Spondylosis without myelopathy or radiculopathy, lumbar region: Secondary | ICD-10-CM | POA: Diagnosis not present

## 2021-04-29 DIAGNOSIS — M62830 Muscle spasm of back: Secondary | ICD-10-CM | POA: Diagnosis not present

## 2021-04-29 DIAGNOSIS — M542 Cervicalgia: Secondary | ICD-10-CM | POA: Diagnosis not present

## 2021-04-30 ENCOUNTER — Ambulatory Visit: Payer: 59 | Admitting: Obstetrics and Gynecology

## 2021-04-30 ENCOUNTER — Encounter: Payer: Self-pay | Admitting: Obstetrics and Gynecology

## 2021-04-30 VITALS — BP 132/82 | HR 81

## 2021-04-30 DIAGNOSIS — N882 Stricture and stenosis of cervix uteri: Secondary | ICD-10-CM | POA: Diagnosis not present

## 2021-04-30 DIAGNOSIS — R87810 Cervical high risk human papillomavirus (HPV) DNA test positive: Secondary | ICD-10-CM | POA: Diagnosis not present

## 2021-04-30 NOTE — Telephone Encounter (Signed)
Call to patient. Per DPR, OK to leave message on voicemail.   Left voicemail requesting a return call to review benefits for Scheduled Surgery with Brook Silva, MD, FACOG.  

## 2021-05-01 NOTE — Telephone Encounter (Signed)
Call to patient. Per DPR, OK to leave message on voicemail.   Left voicemail requesting a return call to review benefits for Scheduled Surgery with Brook Silva, MD, FACOG.  

## 2021-05-01 NOTE — Telephone Encounter (Signed)
Spoke with patient regarding surgery benefits. Patient acknowledges understanding of information presented. Patient is aware that benefits presented are professional benefits only. Patient is aware the hospital will call with facility benefits. See account note. ? ?Encounter closed. ?

## 2021-05-05 ENCOUNTER — Other Ambulatory Visit (HOSPITAL_BASED_OUTPATIENT_CLINIC_OR_DEPARTMENT_OTHER): Payer: Self-pay

## 2021-05-11 NOTE — H&P (Signed)
Office Visit ?04/30/2021 ?[facility] ?Nunzio Cobbs, MD ?Obstetrics and Gynecology Cervical high risk HPV (human papillomavirus) test positive +1 more ?Dx Referred by Hali Marry, MD ?Reason for Visit  ? ?Additional Documentation ? ?Vitals:  BP 132/82 ?Pulse 81 ?LMP 01/27/2000 (Approximate)  ?SpO2 94%  ?Flowsheets:  NEWS, ?MEWS Score ?  ?Encounter Info:  Billing Info, ?History, ?Allergies, ?Detailed Report ?  ? ?All Notes ? ? ? Progress Notes by Nunzio Cobbs, MD at 04/30/2021 4:30 PM ? ?Author: Nunzio Cobbs, MD Author Type: Physician Filed: 05/05/2021  7:10 PM  ?Note Status: Signed Cosign: Cosign Not Required Encounter Date: 04/30/2021  ?Editor: Nunzio Cobbs, MD (Physician)      ?Prior Versions: 1. Netta Corrigan, CMA (Certified Medical Assistant) at 04/30/2021  4:27 PM - Sign when Signing Visit  ?  ?GYNECOLOGY  VISIT ?  ?HPI: ?61 y.o.   Divorced  Caucasian  female   ?[id number] with Patient's last menstrual period was 01/27/2000 (approximate).   ?here for surgicial consultation for outpatient LEEP procedure. ?  ?Patient has had a supracervical hysterectomy.  ?Patient had cervical cancer screening 08/19/20 at another office and she had positive HR HPV.  ?Cytology negative.   ?She has know cervical stenosis and hx LEEP for recurrent abnormal paps and unsatisfactory colposcopy. ?Her final pathology on LEEP showed HPV change and no evidence of high grade dysplasia.  ?  ?She does have pelvic cramping which lead to a pelvic US on 02/27/21.  ?She states the cramping is like a menstrual period. ?She was noted to have simple fluid in the cervical canal and her bilateral ovaries were atrophic.  The right ovary is in the right adnexal region and the left ovary is midline, so they are next to each other.  (This changes has been previously seen on pelvic US.)  ?  ?She developed bloating with use of the vaginal estrogen tablets to treat atrophy, so she  stopped this.  ?  ?In office on 04/11/21, she had attempted cervical dilation of her stenotic cervix to obtain endocervical cells with a pap.  ?A pap at that time was unsatisfactory.  ?  ?GYNECOLOGIC HISTORY: ?Patient's last menstrual period was 01/27/2000 (approximate). ?Contraception: TAH/Supracervical  ?Menopausal hormone therapy: vagifem only ?Last mammogram: 10/02/20-neg birads 1 ?Last pap smear: 04/11/21-unsatisfactory, 08/19/20-WNL, HR HPV+, 07/11/18-WNL, HPV- neg.  ?       ?OB History   ?  ?  Gravida  ?3  ? Para  ?2  ? Term  ?2  ? Preterm  ?   ? AB  ?1  ? Living  ?2  ?  ?  ?  SAB  ?1  ? IAB  ?   ? Ectopic  ?   ? Multiple  ?   ? Live Births  ?   ?    ?  ?   ?    ?  ?    ?Patient Active Problem List  ?  Diagnosis Date Noted  ? CKD (chronic kidney disease) stage 3, GFR 30-59 ml/min (HCC) 06/26/2020  ? Morbid obesity ([place]) 05/30/2020  ? Screening for malignant neoplasm of colon 05/30/2020  ? Gastroesophageal reflux disease without esophagitis 05/16/2020  ? Atypical chest pain 05/16/2020  ? Normocytic anemia 05/08/2020  ? Fatty liver 05/01/2020  ? Decreased appetite 04/22/2020  ? Non-intractable vomiting 04/22/2020  ? OSA (obstructive sleep apnea) 02/08/2020  ? Plantar fasciitis, bilateral 09/19/2019  ?  Pain in right hand 02/21/2018  ? Bursitis of left shoulder 01/24/2016  ? GAD (generalized anxiety disorder) 04/02/2015  ? Localized primary osteoarthritis of carpometacarpal joint of right thumb 03/15/2015  ? Carpal tunnel syndrome, bilateral 04/05/2013  ? LGSIL (low grade squamous intraepithelial dysplasia) 06/16/2012  ? Type 2 diabetes mellitus with other specified complication ([place]) 97/02/6376  ? PANIC ATTACK 09/07/2008  ? RESTLESS LEG SYNDROME 04/02/2008  ? POLYARTHRITIS 04/02/2008  ? Primary osteoarthritis of left knee 01/02/2008  ? HOT FLASHES 08/04/2006  ? Hyperlipidemia associated with type 2 diabetes mellitus ([place]) 11/03/2005  ? HYPERTENSION, BENIGN SYSTEMIC 11/03/2005  ?  ?  ?    ?Past Medical History:   ?Diagnosis Date  ? Abnormal ultrasound of ovary 2016  ?  Ultrasound showing bilateral ovaries in contact with one another.  Asymptomatic.  ? Allergy    ? Anemia    ? Diabetes mellitus    ?  borderline  ? Fibroid    ? Heart murmur    ? Hidradenitis    ? High cholesterol    ? Hypertension    ? Sleep apnea    ?  ?  ?     ?Past Surgical History:  ?Procedure Laterality Date  ? BREAST EXCISIONAL BIOPSY Right    ? BREAST SURGERY      ?  cysts removed right breast  ? BREAST SURGERY   08/2018  ?  benign mass  ? CARPAL TUNNEL RELEASE Right 01/08/2017  ?  Procedure: CARPAL TUNNEL RELEASE, POSSIBLE RIGHT THUMB CARPOMETACARPAL INJECTION;  Surgeon: Roseanne Kaufman, MD;  Location: [facility];  Service: Orthopedics;  Laterality: Right;  60 MINS  ? CESAREAN SECTION      ? INGUINAL HIDRADENITIS EXCISION      ? LEEP   05/04/2013  ?  HPV change  ? SUPRACERVICAL ABDOMINAL HYSTERECTOMY   2002  ?  Done for bleeding, fibroids in [place]  ?  ?  ?      ?Current Outpatient Medications  ?Medication Sig Dispense Refill  ? AMBULATORY NON FORMULARY MEDICATION Set CPAP to 10 cm water pressure. Need download in 2 weeks after change of setting. 1 each 0  ? Ascorbic Acid (VITAMIN C) 1000 MG tablet Take 1 tablet by mouth daily.      ? Blood Glucose Monitoring Suppl (FREESTYLE LITE) DEVI        ? Cyanocobalamin (B-12) 500 MCG TABS Take 1 tablet by mouth daily.      ? Estradiol (VAGIFEM) 10 MCG TABS vaginal tablet Place 1 tablet (10 mcg total) vaginally 2 (two) times a week. Use every night before bed for two weeks when you first begin this medicine, then after the first two weeks, begin using it twice a week. 34 tablet 2  ? famotidine (PEPCID) 40 MG tablet Take 1 tablet by mouth two times daily. 180 tablet 1  ? FOLIC ACID PO Take 588 mcg by mouth daily.      ? glucose blood (FREESTYLE LITE) test strip USE TO TEST BLOOD SUGARS TWICE DAILY (Patient taking differently: USE TO TEST BLOOD SUGARS TWICE DAILY) 400 strip 12  ? Lancets (FREESTYLE)  lancets For use when testing blood sugars daily. DX:E11.69 200 each 12  ? metFORMIN (GLUCOPHAGE) 1000 MG tablet Take 1 tablet (1,000 mg total) by mouth daily with breakfast. 90 tablet 3  ? pyridOXINE (VITAMIN B-6) 100 MG tablet Take 100 mg by mouth daily.      ? rosuvastatin (CRESTOR) 40 MG tablet TAKE  1 TABLET (40 MG TOTAL) BY MOUTH DAILY. 90 tablet 3  ? valsartan (DIOVAN) 40 MG tablet Take 1 tablet (40 mg total) by mouth daily. 90 tablet 3  ? Zinc Sulfate (ZINC 15 PO) Take by mouth.      ?  ?No current facility-administered medications for this visit.  ?  ?  ?ALLERGIES: Sulfonamide derivatives, Xigduo xr [dapagliflozin-metformin hcl er], Hycodan [hydrocodone bit-homatrop mbr], Omeprazole, Cefaclor, Cephalexin, Januvia [sitagliptin], Levofloxacin, Penicillins, Tetracycline, and Victoza [liraglutide] ?  ?     ?Family History  ?Problem Relation Age of Onset  ? Asthma Mother    ? COPD Mother    ? Diabetes Mother    ? Hypertension Mother    ? Diabetes Father    ? Hypertension Father    ? Breast cancer Maternal Aunt    ? Breast cancer Paternal Aunt    ? Colon cancer Neg Hx    ? Pancreatic cancer Neg Hx    ? Esophageal cancer Neg Hx    ? Stomach cancer Neg Hx    ? Liver disease Neg Hx    ?  ?  ?Social History  ?  ?     ?Socioeconomic History  ? Marital status: Divorced  ?    Spouse name: Not on file  ? Number of children: Not on file  ? Years of education: Not on file  ? Highest education level: Not on file  ?Occupational History  ? Not on file  ?Tobacco Use  ? Smoking status: Never  ? Smokeless tobacco: Never  ?Vaping Use  ? Vaping Use: Never used  ?Substance and Sexual Activity  ? Alcohol use: No  ?    Alcohol/week: 0.0 standard drinks  ? Drug use: No  ? Sexual activity: Not Currently  ?    Partners: Male  ?    Birth control/protection: Surgical  ?    Comment: TAH/supracervical  ?Other Topics Concern  ? Not on file  ?Social History Narrative  ? Not on file  ?  ?Social Determinants of Health  ?  ?Financial Resource  Strain: Not on file  ?Food Insecurity: Not on file  ?Transportation Needs: Not on file  ?Physical Activity: Not on file  ?Stress: Not on file  ?Social Connections: Not on file  ?Intimate Partner Violence: Not on file

## 2021-05-16 ENCOUNTER — Other Ambulatory Visit: Payer: Self-pay

## 2021-05-16 ENCOUNTER — Encounter (HOSPITAL_BASED_OUTPATIENT_CLINIC_OR_DEPARTMENT_OTHER): Payer: Self-pay | Admitting: Obstetrics and Gynecology

## 2021-05-16 NOTE — Progress Notes (Signed)
Spoke w/ via phone for pre-op interview---[name] ?Lab needs dos---- CBC, BMP per surgeon, EKG per anesthesia              ?Lab results------01/29/21 EKG in Care Everywhere (no tracing available), 01/29/21 Chest xray in Care Everywhere, 02/19/21 NM Pharm Stress test in Care Everywhere ?COVID test -----patient states asymptomatic no test needed ?Arrive at -------0930 on Monday, 05/19/21 ?NPO after MN NO Solid Food.  Clear liquids from MN until---0830 ?Med rec completed ?Medications to take morning of surgery -----Pepcid ?Diabetic medication -----Hold Metformin morning of surgery ?Patient instructed no nail polish to be worn day of surgery ?Patient instructed to bring photo id and insurance card day of surgery ?Patient aware to have Driver (ride ) / caregiver    for 24 hours after surgery - daughter, [name] ?Patient Special Instructions -----Bring CPAP and leave in car. ?Pre-Op special Istructions -----Patient wears contacts, but will wear glasses day of surgery. ?Patient verbalized understanding of instructions that were given at this phone interview. ?Patient denies shortness of breath, chest pain, fever, cough at this phone interview.  ? ?Patient has a hx of HLD, HTN, DM2, OSA w/CPAP compliance, CKD III, GERD and fatty liver. Patient had an episode of chest pain on 01/29/21 and went to the ER. Her pain significantly improved with NTG. CBC, CMP,troponin, BNP were unremarkable. EKG showed NSR without acute ST changes. She was discharged home with outpatient follow-up. She had a negative NM Pharm Stress test on 02/19/21 (Care Everywhere.) ?

## 2021-05-19 ENCOUNTER — Other Ambulatory Visit: Payer: Self-pay

## 2021-05-19 ENCOUNTER — Ambulatory Visit (HOSPITAL_BASED_OUTPATIENT_CLINIC_OR_DEPARTMENT_OTHER): Payer: 59 | Admitting: Anesthesiology

## 2021-05-19 ENCOUNTER — Other Ambulatory Visit (HOSPITAL_COMMUNITY): Payer: Self-pay

## 2021-05-19 ENCOUNTER — Encounter (HOSPITAL_BASED_OUTPATIENT_CLINIC_OR_DEPARTMENT_OTHER): Payer: Self-pay | Admitting: Obstetrics and Gynecology

## 2021-05-19 ENCOUNTER — Ambulatory Visit (HOSPITAL_BASED_OUTPATIENT_CLINIC_OR_DEPARTMENT_OTHER)
Admission: RE | Admit: 2021-05-19 | Discharge: 2021-05-19 | Disposition: A | Discharge status: Home / Self Care | Payer: 59 | Attending: Obstetrics and Gynecology | Admitting: Obstetrics and Gynecology

## 2021-05-19 ENCOUNTER — Encounter (HOSPITAL_BASED_OUTPATIENT_CLINIC_OR_DEPARTMENT_OTHER)
Admission: RE | Disposition: A | Discharge status: Home / Self Care | Payer: Self-pay | Source: Home / Self Care | Attending: Obstetrics and Gynecology

## 2021-05-19 DIAGNOSIS — R87810 Cervical high risk human papillomavirus (HPV) DNA test positive: Secondary | ICD-10-CM | POA: Diagnosis not present

## 2021-05-19 DIAGNOSIS — R87612 Low grade squamous intraepithelial lesion on cytologic smear of cervix (LGSIL): Secondary | ICD-10-CM | POA: Diagnosis not present

## 2021-05-19 DIAGNOSIS — N882 Stricture and stenosis of cervix uteri: Secondary | ICD-10-CM

## 2021-05-19 DIAGNOSIS — I129 Hypertensive chronic kidney disease with stage 1 through stage 4 chronic kidney disease, or unspecified chronic kidney disease: Secondary | ICD-10-CM | POA: Diagnosis not present

## 2021-05-19 DIAGNOSIS — Z90711 Acquired absence of uterus with remaining cervical stump: Secondary | ICD-10-CM | POA: Insufficient documentation

## 2021-05-19 DIAGNOSIS — N870 Mild cervical dysplasia: Secondary | ICD-10-CM | POA: Diagnosis not present

## 2021-05-19 DIAGNOSIS — N183 Chronic kidney disease, stage 3 unspecified: Secondary | ICD-10-CM | POA: Insufficient documentation

## 2021-05-19 DIAGNOSIS — I10 Essential (primary) hypertension: Secondary | ICD-10-CM | POA: Diagnosis not present

## 2021-05-19 DIAGNOSIS — K219 Gastro-esophageal reflux disease without esophagitis: Secondary | ICD-10-CM | POA: Diagnosis not present

## 2021-05-19 DIAGNOSIS — N72 Inflammatory disease of cervix uteri: Secondary | ICD-10-CM | POA: Diagnosis not present

## 2021-05-19 DIAGNOSIS — Z9989 Dependence on other enabling machines and devices: Secondary | ICD-10-CM | POA: Diagnosis not present

## 2021-05-19 DIAGNOSIS — E1122 Type 2 diabetes mellitus with diabetic chronic kidney disease: Secondary | ICD-10-CM | POA: Diagnosis not present

## 2021-05-19 DIAGNOSIS — G4733 Obstructive sleep apnea (adult) (pediatric): Secondary | ICD-10-CM | POA: Diagnosis not present

## 2021-05-19 HISTORY — PX: COLPOSCOPY: SHX161

## 2021-05-19 HISTORY — DX: Other complications of anesthesia, initial encounter: T88.59XA

## 2021-05-19 HISTORY — DX: Gastro-esophageal reflux disease without esophagitis: K21.9

## 2021-05-19 HISTORY — PX: LEEP: SHX91

## 2021-05-19 HISTORY — DX: Fatty (change of) liver, not elsewhere classified: K76.0

## 2021-05-19 HISTORY — DX: Other chronic pain: G89.29

## 2021-05-19 HISTORY — DX: Presence of spectacles and contact lenses: Z97.3

## 2021-05-19 HISTORY — DX: Chronic kidney disease, unspecified: N18.9

## 2021-05-19 HISTORY — DX: Unspecified osteoarthritis, unspecified site: M19.90

## 2021-05-19 LAB — BASIC METABOLIC PANEL
Anion gap: 9 (ref 5–15)
BUN: 27 mg/dL — ABNORMAL HIGH (ref 6–20)
CO2: 24 mmol/L (ref 22–32)
Calcium: 9.5 mg/dL (ref 8.9–10.3)
Chloride: 108 mmol/L (ref 98–111)
Creatinine, Ser: 1.47 mg/dL — ABNORMAL HIGH (ref 0.44–1.00)
GFR, Estimated: 41 mL/min — ABNORMAL LOW (ref >60)
Glucose, Bld: 149 mg/dL — ABNORMAL HIGH (ref 70–99)
Potassium: 3.9 mmol/L (ref 3.5–5.1)
Sodium: 141 mmol/L (ref 135–145)

## 2021-05-19 LAB — CBC
HCT: 35 % — ABNORMAL LOW (ref 36.0–46.0)
Hemoglobin: 11.2 g/dL — ABNORMAL LOW (ref 12.0–15.0)
MCH: 27.9 pg (ref 26.0–34.0)
MCHC: 32 g/dL (ref 30.0–36.0)
MCV: 87.1 fL (ref 80.0–100.0)
Platelets: 286 10*3/uL (ref 150–400)
RBC: 4.02 MIL/uL (ref 3.87–5.11)
RDW: 13.4 % (ref 11.5–15.5)
WBC: 9.2 10*3/uL (ref 4.0–10.5)
nRBC: 0 % (ref 0.0–0.2)

## 2021-05-19 LAB — GLUCOSE, CAPILLARY: Glucose-Capillary: 148 mg/dL — ABNORMAL HIGH (ref 70–99)

## 2021-05-19 SURGERY — LEEP (LOOP ELECTROSURGICAL EXCISION PROCEDURE)
Anesthesia: General | Site: Vagina

## 2021-05-19 MED ORDER — DEXAMETHASONE SODIUM PHOSPHATE 10 MG/ML IJ SOLN
INTRAMUSCULAR | Status: DC | PRN
  Administered 2021-05-19: 5 mg via INTRAVENOUS

## 2021-05-19 MED ORDER — LACTATED RINGERS IV SOLN: INTRAVENOUS | Status: DC

## 2021-05-19 MED ORDER — PROPOFOL 10 MG/ML IV BOLUS
INTRAVENOUS | Status: AC
Start: 2021-05-19 — End: ?
  Filled 2021-05-19: qty 20

## 2021-05-19 MED ORDER — ONDANSETRON HCL 4 MG/2ML IJ SOLN
INTRAMUSCULAR | Status: DC | PRN
  Administered 2021-05-19: 4 mg via INTRAVENOUS

## 2021-05-19 MED ORDER — ONDANSETRON HCL 4 MG/2ML IJ SOLN: 4.0000 mg | Freq: Once | INTRAMUSCULAR | Status: DC | PRN

## 2021-05-19 MED ORDER — MIDAZOLAM HCL 2 MG/2ML IJ SOLN
INTRAMUSCULAR | Status: AC
  Filled 2021-05-19: qty 2

## 2021-05-19 MED ORDER — FENTANYL CITRATE (PF) 100 MCG/2ML IJ SOLN
INTRAMUSCULAR | Status: AC
  Filled 2021-05-19: qty 2

## 2021-05-19 MED ORDER — PROPOFOL 10 MG/ML IV BOLUS
INTRAVENOUS | Status: DC | PRN
  Administered 2021-05-19: 150 mg via INTRAVENOUS

## 2021-05-19 MED ORDER — SODIUM CHLORIDE 0.9 % IV SOLN: INTRAVENOUS | Status: DC

## 2021-05-19 MED ORDER — IBUPROFEN 400 MG PO TABS
400.0000 mg | ORAL_TABLET | Freq: Four times a day (QID) | ORAL | 0 refills | Status: DC | PRN
  Filled 2021-05-19: qty 30, 8d supply, fill #0

## 2021-05-19 MED ORDER — OXYCODONE HCL 5 MG PO TABS: 5.0000 mg | ORAL_TABLET | Freq: Once | ORAL | Status: DC | PRN

## 2021-05-19 MED ORDER — OXYCODONE HCL 5 MG/5ML PO SOLN: 5.0000 mg | Freq: Once | ORAL | Status: DC | PRN

## 2021-05-19 MED ORDER — FENTANYL CITRATE (PF) 250 MCG/5ML IJ SOLN
INTRAMUSCULAR | Status: DC | PRN
  Administered 2021-05-19: 25 ug via INTRAVENOUS
  Administered 2021-05-19: 50 ug via INTRAVENOUS

## 2021-05-19 MED ORDER — LIDOCAINE-EPINEPHRINE 1 %-1:100000 IJ SOLN
INTRAMUSCULAR | Status: DC | PRN
Start: 2021-05-19 — End: 2021-05-19
  Administered 2021-05-19: 10 mL

## 2021-05-19 MED ORDER — IODINE STRONG (LUGOLS) 5 % PO SOLN
ORAL | Status: DC | PRN
  Administered 2021-05-19: 0.1 mL

## 2021-05-19 MED ORDER — LIDOCAINE 2% (20 MG/ML) 5 ML SYRINGE
INTRAMUSCULAR | Status: DC | PRN
  Administered 2021-05-19: 100 mg via INTRAVENOUS

## 2021-05-19 MED ORDER — KETOROLAC TROMETHAMINE 30 MG/ML IJ SOLN
INTRAMUSCULAR | Status: DC | PRN
Start: 2021-05-19 — End: 2021-05-19
  Administered 2021-05-19: 30 mg via INTRAVENOUS

## 2021-05-19 MED ORDER — POVIDONE-IODINE 10 % EX SWAB: 2.0000 "application " | Freq: Once | CUTANEOUS | Status: DC

## 2021-05-19 MED ORDER — FENTANYL CITRATE (PF) 100 MCG/2ML IJ SOLN: 25.0000 ug | INTRAMUSCULAR | Status: DC | PRN

## 2021-05-19 MED ORDER — ACETIC ACID 4% SOLUTION
Status: DC | PRN
  Administered 2021-05-19: 1 via TOPICAL

## 2021-05-19 MED ORDER — FERRIC SUBSULFATE (BULK) SOLN
Status: DC | PRN
  Administered 2021-05-19: 1

## 2021-05-19 MED ORDER — ACETAMINOPHEN 10 MG/ML IV SOLN: 1000.0000 mg | Freq: Once | INTRAVENOUS | Status: DC | PRN

## 2021-05-19 MED ORDER — 0.9 % SODIUM CHLORIDE (POUR BTL) OPTIME
TOPICAL | Status: DC | PRN
  Administered 2021-05-19: 500 mL

## 2021-05-19 MED ORDER — MIDAZOLAM HCL 2 MG/2ML IJ SOLN
INTRAMUSCULAR | Status: DC | PRN
  Administered 2021-05-19: 1 mg via INTRAVENOUS

## 2021-05-19 MED ORDER — IBUPROFEN 400 MG PO TABS
400.0000 mg | ORAL_TABLET | Freq: Four times a day (QID) | ORAL | 0 refills | Status: DC | PRN
Start: 2021-05-19 — End: 2021-06-03
  Filled 2021-05-19: qty 30, 8d supply, fill #0

## 2021-05-19 SURGICAL SUPPLY — 29 items
APL SWBSTK 6 STRL LF DISP (MISCELLANEOUS) ×1
APPLICATOR COTTON TIP 6 STRL (MISCELLANEOUS) IMPLANT
APPLICATOR COTTON TIP 6IN STRL (MISCELLANEOUS) ×2
CATH ROBINSON RED A/P 16FR (CATHETERS) ×2 IMPLANT
ELECT BALL LEEP 5MM RED (ELECTRODE) ×2 IMPLANT
ELECT LOOP LEEP RND 15X12 GRN (CUTTING LOOP) ×2
ELECT LOOP LEEP SQR 10X10 ORG (CUTTING LOOP)
ELECT REM PT RETURN 9FT ADLT (ELECTROSURGICAL) ×2
ELECTRODE LOOP LP RND 15X12GRN (CUTTING LOOP) IMPLANT
ELECTRODE LOOP LP SQR 10X10ORG (CUTTING LOOP) ×1 IMPLANT
ELECTRODE REM PT RTRN 9FT ADLT (ELECTROSURGICAL) ×1 IMPLANT
GAUZE 4X4 16PLY ~~LOC~~+RFID DBL (SPONGE) ×2 IMPLANT
GLOVE BIO SURGEON STRL SZ 6.5 (GLOVE) ×2 IMPLANT
GLOVE BIOGEL PI IND STRL 7.0 (GLOVE) IMPLANT
GLOVE BIOGEL PI INDICATOR 7.0 (GLOVE) ×3
GOWN STRL REUS W/TWL LRG LVL3 (GOWN DISPOSABLE) ×3 IMPLANT
KIT TURNOVER CYSTO (KITS) ×2 IMPLANT
MANIFOLD NEPTUNE II (INSTRUMENTS) ×1 IMPLANT
NS IRRIG 500ML POUR BTL (IV SOLUTION) ×1 IMPLANT
PACK VAGINAL WOMENS (CUSTOM PROCEDURE TRAY) ×2 IMPLANT
PAD OB MATERNITY 4.3X12.25 (PERSONAL CARE ITEMS) ×2 IMPLANT
PENCIL BUTTON HOLSTER BLD 10FT (ELECTRODE) ×1 IMPLANT
SCOPETTES 8  STERILE (MISCELLANEOUS) ×2
SCOPETTES 8 STERILE (MISCELLANEOUS) ×2 IMPLANT
SUT VIC AB 2-0 SH 27 (SUTURE) ×2
SUT VIC AB 2-0 SH 27XBRD (SUTURE) IMPLANT
TOWEL OR 17X26 10 PK STRL BLUE (TOWEL DISPOSABLE) ×3 IMPLANT
TUBE CONNECTING 12X1/4 (SUCTIONS) ×2 IMPLANT
VACUUM HOSE 7/8X10 W/ WAND (MISCELLANEOUS) ×1 IMPLANT

## 2021-05-19 NOTE — Op Note (Signed)
OPERATIVE REPORT ? ?PRE-OP DIAGNOSIS:   Positive high risk HPV, cervical stenosis, history of supracervical hysterectomy.  ? ?POST OP DIAGNOSIS:   Positive high risk HPV, cervical stenosis, history of supracervical hysterectomy.  ? ?PROCEDURE:  Colposcopy with LEEP and ECC ? ?SURGEON:  Aundria Rud, MD ? ?ANESTHESIA:  LMA, local with 10 cc 1% lidocaine with epinepherine 1:100,000 ? ?IVF:  400 cc LR ? ?URINE OUTPUT:   15 cc ? ?EBL:   5 cc ? ?COMPLICATIONS:  None.  ? ?INDICATIONS FOR PROCEDURE:   ? ?The patient is a 61 year old G20P2012 [name], status post supracervical hysterectomy and status post LEEP procedure for recurrent abnormal paps with final pathology showing HPV change, who presents with a pap showing positive high risk HPV.  She has known cervical stenosis.  Attempt to get inside the cervical canal to obtain endocervical cells was not successful in the office.  ? ?A plan was therefore made to proceed with a LEEP procedure and ECC in an outpatient hospital setting.  Risks, benefits, and alternatives were reviewed with the patient who wished to proceed.  ? ?FINDINGS:   ? ?Exam under anesthesia revealed a normal cervix. ?No midline nor adnexal masses were noted.  ? ?The cervix was noted to be stenotic.  Colposcopy was not satisfactory and demonstrated no lesions of the cervix or vagina.  The patient had atrophy.  Both incandescent light and the green light filter were used to visualize the cervix.   ? ?Lugols demonstrated some decreased uptake inferiorly at the 6:00 position on the cervix, but the epithelium was denuded in this area following the preop with Hibiclens.  ? ?SPECIMENS: ? ?The LEEP specimen was marked at 12:00 with suture and sent to pathology. ?The ECC was sent to pathology separately.  ? ?PROCEDURE: ? ?The patient was reidentified in the preop hold area.  ?In the operating room, she received an LMA anesthetic.  ?She was placed in the dorsal lithology position with [employer]  stirrups.  ?An exam under anesthesia was performed.  ?A speculum was placed in the vagina. ?Acetic acid was used on the cervix and colposcopy was performed.  ?See the findings above.  ? ?The vagina was then prepped with Hibiclens, and her bladder was catheterized with a red rubber catheter.  ?  ?An insulated speculum was placed the the vagina.  ?Lugols was then placed on the cervix to outline the treatment area.  ?The cervix was injected with 10 cc 1% lidocaine with epinepherine 1:100,000. ? ?The LEEP was performed with a cutting setting of 50 watts.  ?The specimen was later marked with suture at the 12:00 position and sent to pathology. ? ?The endocervical canal was sounded with the uterine sound.  ? ?The ECC was performed with the Kevorkian curette and sent to pathology separately.  ?  ?The cervix was coagulated with the cautery ball on a setting of 40 watts.  ?Care was taken to avoid the endocervix.  ?Hemostasis was good.  ?Monsel's solution was placed.  ? ?There were no complications to the procedure.  ?All needle, instrument, and sponge counts were correct.  ?The patient was escorted to the recovery room in stable and awake condition.  ? ?SIGNED: ? ?Aundria Rud, MD ? ? ? ?

## 2021-05-19 NOTE — Anesthesia Postprocedure Evaluation (Signed)
Anesthesia Post Note ? ?Patient: Maria Nunez ? ?Procedure(s) Performed: LOOP ELECTROSURGICAL EXCISION PROCEDURE (LEEP), ENDOCERVICAL CURETTAGE (Vagina ) ?COLPOSCOPY (Vagina ) ? ?  ? ?Patient location during evaluation: PACU ?Anesthesia Type: General ?Level of consciousness: awake and alert ?Pain management: pain level controlled ?Vital Signs Assessment: post-procedure vital signs reviewed and stable ?Respiratory status: spontaneous breathing, nonlabored ventilation, respiratory function stable and patient connected to nasal cannula oxygen ?Cardiovascular status: blood pressure returned to baseline and stable ?Postop Assessment: no apparent nausea or vomiting ?Anesthetic complications: no ? ? ?No notable events documented. ? ?Last Vitals:  ?Vitals:  ? 05/19/21 1208 05/19/21 1215  ?BP:  (!) 172/71  ?Pulse: (!) 109 (!) 102  ?Resp: 15 15  ?Temp:    ?SpO2: 93% 92%  ?  ?Last Pain:  ?Vitals:  ? 05/19/21 1215  ?TempSrc:   ?PainSc: 0-No pain  ? ? ?  ?  ?  ?  ?  ?  ? ?Omri Bertran S ? ? ? ? ?

## 2021-05-19 NOTE — Transfer of Care (Signed)
Immediate Anesthesia Transfer of Care Note ? ?Patient: Maria Nunez ? ?Procedure(s) Performed: LOOP ELECTROSURGICAL EXCISION PROCEDURE (LEEP), ENDOCERVICAL CURETTAGE (Vagina ) ?COLPOSCOPY (Vagina ) ? ?Patient Location: PACU ? ?Anesthesia Type:General ? ?Level of Consciousness: drowsy ? ?Airway & Oxygen Therapy: Patient Spontanous Breathing ? ?Post-op Assessment: Report given to RN and Post -op Vital signs reviewed and stable ? ?Post vital signs: Reviewed and stable ? ?Last Vitals:  ?Vitals Value Taken Time  ?BP    ?Temp    ?Pulse 108 05/19/21 1204  ?Resp 15 05/19/21 1204  ?SpO2 96 % 05/19/21 1204  ?Vitals shown include unvalidated device data. ? ?Last Pain:  ?Vitals:  ? 05/19/21 0954  ?TempSrc: Oral  ?PainSc: 0-No pain  ?   ? ?Patients Stated Pain Goal: 5 (05/19/21 0954) ? ?Complications: No notable events documented. ?

## 2021-05-19 NOTE — Anesthesia Procedure Notes (Signed)
Procedure Name: LMA Insertion ?Date/Time: 05/19/2021 10:57 AM ?Performed by: Clearnce Sorrel, CRNA ?Pre-anesthesia Checklist: Patient identified, Emergency Drugs available, Suction available and Patient being monitored ?Patient Re-evaluated:Patient Re-evaluated prior to induction ?Oxygen Delivery Method: Circle System Utilized ?Preoxygenation: Pre-oxygenation with 100% oxygen ?Induction Type: IV induction ?Ventilation: Mask ventilation without difficulty ?LMA: LMA inserted ?LMA Size: 4.0 ?Number of attempts: 1 ?Airway Equipment and Method: Bite block ?Placement Confirmation: positive ETCO2 ?Tube secured with: Tape ?Dental Injury: Teeth and Oropharynx as per pre-operative assessment  ? ? ? ? ?

## 2021-05-19 NOTE — Progress Notes (Signed)
Update to History and Physical ? ?No marked change in status since office preop visit.  ?Vitals:  ? 05/19/21 0954  ?BP: (!) 176/78  ?Pulse: 89  ?Resp: 16  ?Temp: 97.9 ?F (36.6 ?C)  ?SpO2: 97%  ? ? ?Patient examined.  ?OK to proceed with surgery.  ? ?

## 2021-05-19 NOTE — Anesthesia Preprocedure Evaluation (Signed)
Anesthesia Evaluation  ?Patient identified by MRN, date of birth, ID band ?Patient awake ? ? ? ?Reviewed: ?Allergy & Precautions, NPO status , Patient's Chart, lab work & pertinent test results ? ?Airway ?Mallampati: II ? ?TM Distance: <3 FB ?Neck ROM: Full ? ? ? Dental ?no notable dental hx. ? ?  ?Pulmonary ?sleep apnea and Continuous Positive Airway Pressure Ventilation ,  ?  ?Pulmonary exam normal ?breath sounds clear to auscultation ? ? ? ? ? ? Cardiovascular ?hypertension, negative cardio ROS ?Normal cardiovascular exam ?Rhythm:Regular Rate:Normal ? ? ?  ?Neuro/Psych ?negative neurological ROS ? negative psych ROS  ? GI/Hepatic ?Neg liver ROS, GERD  ,  ?Endo/Other  ?diabetes, Type 2 ? Renal/GU ?Renal InsufficiencyRenal disease  ?negative genitourinary ?  ?Musculoskeletal ?negative musculoskeletal ROS ?(+)  ? Abdominal ?  ?Peds ?negative pediatric ROS ?(+)  Hematology ?negative hematology ROS ?(+)   ?Anesthesia Other Findings ? ? Reproductive/Obstetrics ?negative OB ROS ? ?  ? ? ? ? ? ? ? ? ? ? ? ? ? ?  ?  ? ? ? ? ? ? ? ? ?Anesthesia Physical ?Anesthesia Plan ? ?ASA: 3 ? ?Anesthesia Plan: General  ? ?Post-op Pain Management: Minimal or no pain anticipated  ? ?Induction: Intravenous ? ?PONV Risk Score and Plan: 3 and Ondansetron, Dexamethasone, Treatment may vary due to age or medical condition and Midazolam ? ?Airway Management Planned: LMA ? ?Additional Equipment:  ? ?Intra-op Plan:  ? ?Post-operative Plan: Extubation in OR ? ?Informed Consent: I have reviewed the patients History and Physical, chart, labs and discussed the procedure including the risks, benefits and alternatives for the proposed anesthesia with the patient or authorized representative who has indicated his/her understanding and acceptance.  ? ? ? ?Dental advisory given ? ?Plan Discussed with: CRNA and Surgeon ? ?Anesthesia Plan Comments:   ? ? ? ? ? ? ?Anesthesia Quick Evaluation ? ?

## 2021-05-19 NOTE — Discharge Instructions (Addendum)
Hi Leyanna,  ? ?Your LEEP procedure went well today.  ? ?I was able to get inside the canal of your cervix.  ?I did not encounter any fluid.  ? ?Josefa Half, MD ? ?Post Anesthesia Home Care Instructions ? ?Activity: ?Get plenty of rest for the remainder of the day. A responsible individual must stay with you for 24 hours following the procedure.  ?For the next 24 hours, DO NOT: ?-Drive a car ?-Paediatric nurse ?-Drink alcoholic beverages ?-Take any medication unless instructed by your physician ?-Make any legal decisions or sign important papers. ? ?Meals: ?Start with liquid foods such as gelatin or soup. Progress to regular foods as tolerated. Avoid greasy, spicy, heavy foods. If nausea and/or vomiting occur, drink only clear liquids until the nausea and/or vomiting subsides. Call your physician if vomiting continues. ? ?Special Instructions/Symptoms: ?Your throat may feel dry or sore from the anesthesia or the breathing tube placed in your throat during surgery. If this causes discomfort, gargle with warm salt water. The discomfort should disappear within 24 hours. ? ? ?

## 2021-05-20 ENCOUNTER — Encounter (HOSPITAL_BASED_OUTPATIENT_CLINIC_OR_DEPARTMENT_OTHER): Payer: Self-pay | Admitting: Obstetrics and Gynecology

## 2021-05-20 LAB — SURGICAL PATHOLOGY

## 2021-05-21 ENCOUNTER — Encounter: Payer: Self-pay | Admitting: Obstetrics and Gynecology

## 2021-05-28 DIAGNOSIS — G4733 Obstructive sleep apnea (adult) (pediatric): Secondary | ICD-10-CM | POA: Diagnosis not present

## 2021-06-03 ENCOUNTER — Other Ambulatory Visit (HOSPITAL_BASED_OUTPATIENT_CLINIC_OR_DEPARTMENT_OTHER): Payer: Self-pay

## 2021-06-03 ENCOUNTER — Ambulatory Visit (INDEPENDENT_AMBULATORY_CARE_PROVIDER_SITE_OTHER): Payer: 59 | Admitting: Obstetrics and Gynecology

## 2021-06-03 ENCOUNTER — Encounter: Payer: Self-pay | Admitting: Obstetrics and Gynecology

## 2021-06-03 VITALS — BP 134/82 | HR 77 | Ht 60.0 in | Wt 163.0 lb

## 2021-06-03 DIAGNOSIS — N72 Inflammatory disease of cervix uteri: Secondary | ICD-10-CM

## 2021-06-03 DIAGNOSIS — Z9889 Other specified postprocedural states: Secondary | ICD-10-CM

## 2021-06-03 MED ORDER — METRONIDAZOLE 500 MG PO TABS
500.0000 mg | ORAL_TABLET | Freq: Two times a day (BID) | ORAL | 0 refills | Status: DC
  Filled 2021-06-03: qty 14, 7d supply, fill #0

## 2021-06-03 NOTE — Progress Notes (Signed)
GYNECOLOGY  VISIT ?  ?HPI: ?61 y.o.   Divorced  Caucasian  female   ?[id number] with Patient's last menstrual period was 01/27/2000 (approximate).   ?here for 2 weeks status post ?LOOP ELECTROSURGICAL EXCISION PROCEDURE (LEEP), ENDOCERVICAL CURETTAGE (Vagina )  ?COLPOSCOPY (Vagina ). ?Final pathology - LGSIL, ECC scanty cervical stroma and mucoinflammatory debris.  ? ?Bled for the first week.  ?Cramping started the second week.  ? ?Denies urinary incontinence.  ?Had one episode of painful urination. ? ?Patient states she reacted to the tape used to cover her eyes while she was under anesthesia. ? ?GYNECOLOGIC HISTORY: ?Patient's last menstrual period was 01/27/2000 (approximate). ?Contraception:  Supracervical Hyst ?Menopausal hormone therapy:  None ?Last mammogram:  10/02/20-neg birads 1 ?Last pap smear:  04/11/21-unsatisfactory, 08/19/20-WNL, HR HPV+, 07/11/18-WNL, HPV- neg.  ?       ?OB History   ? ? Gravida  ?3  ? Para  ?2  ? Term  ?2  ? Preterm  ?   ? AB  ?1  ? Living  ?2  ?  ? ? SAB  ?1  ? IAB  ?   ? Ectopic  ?   ? Multiple  ?   ? Live Births  ?   ?   ?  ?  ?    ? ?Patient Active Problem List  ? Diagnosis Date Noted  ? CKD (chronic kidney disease) stage 3, GFR 30-59 ml/min (HCC) 06/26/2020  ? Morbid obesity ([place]) 05/30/2020  ? Screening for malignant neoplasm of colon 05/30/2020  ? Gastroesophageal reflux disease without esophagitis 05/16/2020  ? Atypical chest pain 05/16/2020  ? Normocytic anemia 05/08/2020  ? Fatty liver 05/01/2020  ? Decreased appetite 04/22/2020  ? Non-intractable vomiting 04/22/2020  ? OSA (obstructive sleep apnea) 02/08/2020  ? Plantar fasciitis, bilateral 09/19/2019  ? Pain in right hand 02/21/2018  ? Bursitis of left shoulder 01/24/2016  ? GAD (generalized anxiety disorder) 04/02/2015  ? Localized primary osteoarthritis of carpometacarpal joint of right thumb 03/15/2015  ? Carpal tunnel syndrome, bilateral 04/05/2013  ? LGSIL (low grade squamous intraepithelial dysplasia) 06/16/2012  ? Type 2  diabetes mellitus with other specified complication ([place]) 83/66/2947  ? PANIC ATTACK 09/07/2008  ? RESTLESS LEG SYNDROME 04/02/2008  ? POLYARTHRITIS 04/02/2008  ? Primary osteoarthritis of left knee 01/02/2008  ? HOT FLASHES 08/04/2006  ? Hyperlipidemia associated with type 2 diabetes mellitus ([place]) 11/03/2005  ? HYPERTENSION, BENIGN SYSTEMIC 11/03/2005  ? ? ?Past Medical History:  ?Diagnosis Date  ? Abnormal ultrasound of ovary 2016  ? Ultrasound showing bilateral ovaries in contact with one another.  Asymptomatic.  ? Allergy   ? multiple med allergies  ? Anemia 04/29/2020  ? Arthritis   ? left knee  ? Bursitis 01/24/2016  ? left shoulder  ? Chest pain 01/29/2021  ? See 01/29/21 ED note in Epic. Pain significantly improved w/ NTG. CBC, CMP, troponin, BNP unremarkable. EKG showed NSR w/o acute ST changes. 02/19/21 [facility] Stress test - normal.  ? Chest pain 01/13/2019  ? Troponin negative x 2, Cxray normal, EKG sinus rhythm w/ borderline T abnormality. Discharged from ER w/ instruction to f/u w/PCP.  ? Chronic back pain   ? Chronic kidney disease   ? Stage 3, follows w/PCP  ? Complication of anesthesia   ? Pt states that one time she was told that she was given general anesthesia too fast. She woke up covered in blotches and was given IV Benadryl per pt.  ? COVID-19  03/05/2021  ? congestion, fever, all symptoms resolved as of 05/16/21  ? Diabetes mellitus   ? Type 2  ? Fatty liver   ? Fibroid   ? hysteretomy in 2002  ? GERD (gastroesophageal reflux disease)   ? Patient is taking Pepcid as of 05/16/21.  ? Heart murmur   ? very long time ago per pt on 05/16/21  ? Hidradenitis   ? years ago per pt on 05/16/21  ? High cholesterol   ? follows w/ PCP Dr. Beatrice Lecher  ? Hypertension   ? follows with PCP Dr. Beatrice Lecher  ? Plantar fasciitis, bilateral 09/24/2020  ? Sleep apnea   ? wears CPAP  ? Spondylosis 2023  ? lumbar, cervicothoracic region with cervicalgia  ? Wears contact lenses   ? Wears glasses    ? ? ?Past Surgical History:  ?Procedure Laterality Date  ? BREAST EXCISIONAL BIOPSY Right 2020  ? BREAST SURGERY    ? cysts removed right breast, around 20 years ago per pt on 05/16/21  ? BREAST SURGERY  08/2018  ? benign mass  ? CARPAL TUNNEL RELEASE Right 01/08/2017  ? Procedure: CARPAL TUNNEL RELEASE, POSSIBLE RIGHT THUMB CARPOMETACARPAL INJECTION;  Surgeon: Roseanne Kaufman, MD;  Location: [place] [facility] [facility];  Service: Orthopedics;  Laterality: Right;  60 MINS  ? CESAREAN SECTION    ? [place]  ? COLPOSCOPY N/A 05/19/2021  ? Procedure: COLPOSCOPY;  Surgeon: Nunzio Cobbs, MD;  Location: [facility];  Service: Gynecology;  Laterality: N/A;  ? ESOPHAGOGASTRODUODENOSCOPY  07/19/2020  ? in [place]  ? INGUINAL HIDRADENITIS EXCISION    ? many years ago per pt on 05/16/21  ? LEEP  05/04/2013  ? HPV change  ? LEEP N/A 05/19/2021  ? Procedure: LOOP ELECTROSURGICAL EXCISION PROCEDURE (LEEP), ENDOCERVICAL CURETTAGE;  Surgeon: Nunzio Cobbs, MD;  Location[facility];  Service: Gynecology;  Laterality: N/A;  ? SUPRACERVICAL ABDOMINAL HYSTERECTOMY  2002  ? Done for bleeding, fibroids in [place]  ? ? ?Current Outpatient Medications  ?Medication Sig Dispense Refill  ? AMBULATORY NON FORMULARY MEDICATION Set CPAP to 10 cm water pressure. Need download in 2 weeks after change of setting. 1 each 0  ? Ascorbic Acid (VITAMIN C) 1000 MG tablet Take 1 tablet by mouth daily.    ? Blood Glucose Monitoring Suppl (FREESTYLE LITE) DEVI     ? Cyanocobalamin (B-12) 500 MCG TABS Take 1 tablet by mouth daily.    ? famotidine (PEPCID) 40 MG tablet Take 1 tablet by mouth two times daily. (Patient taking differently: Take 40 mg by mouth 2 (two) times daily. Usually just takes once daily.) 180 tablet 1  ? FOLIC ACID PO Take 700 mcg by mouth daily.    ? glucose blood (FREESTYLE LITE) test strip USE TO TEST BLOOD SUGARS TWICE DAILY 400 strip 12  ? Lancets (FREESTYLE) lancets For use when testing  blood sugars daily. DX:E11.69 200 each 12  ? melatonin 1 MG TABS tablet Take 1 mg by mouth at bedtime.    ? metFORMIN (GLUCOPHAGE) 1000 MG tablet Take 1 tablet (1,000 mg total) by mouth daily with breakfast. 90 tablet 3  ? metroNIDAZOLE (FLAGYL) 500 MG tablet Take 1 tablet (500 mg total) by mouth 2 (two) times daily. 14 tablet 0  ? pyridOXINE (VITAMIN B-6) 100 MG tablet Take 100 mg by mouth daily.    ? rosuvastatin (CRESTOR) 40 MG tablet TAKE 1 TABLET (40 MG TOTAL)  BY MOUTH DAILY. (Patient taking differently: Take by mouth every evening.) 90 tablet 3  ? valsartan (DIOVAN) 40 MG tablet Take 1 tablet (40 mg total) by mouth daily. 90 tablet 3  ? Zinc Sulfate (ZINC 15 PO) Take by mouth.    ? ?No current facility-administered medications for this visit.  ?  ? ?ALLERGIES: Sulfonamide derivatives, Xigduo xr [dapagliflozin-metformin hcl er], Hycodan [hydrocodone bit-homatrop mbr], Omeprazole, Cefaclor, Cephalexin, Januvia [sitagliptin], Levofloxacin, Penicillins, Tetracycline, and Victoza [liraglutide] ? ?Family History  ?Problem Relation Age of Onset  ? Asthma Mother   ? COPD Mother   ? Diabetes Mother   ? Hypertension Mother   ? Diabetes Father   ? Hypertension Father   ? Breast cancer Maternal Aunt   ? Breast cancer Paternal Aunt   ? Colon cancer Neg Hx   ? Pancreatic cancer Neg Hx   ? Esophageal cancer Neg Hx   ? Stomach cancer Neg Hx   ? Liver disease Neg Hx   ? ? ?Social History  ? ?Socioeconomic History  ? Marital status: Divorced  ?  Spouse name: Not on file  ? Number of children: Not on file  ? Years of education: Not on file  ? Highest education level: Not on file  ?Occupational History  ? Not on file  ?Tobacco Use  ? Smoking status: Never  ? Smokeless tobacco: Never  ?Vaping Use  ? Vaping Use: Never used  ?Substance and Sexual Activity  ? Alcohol use: No  ?  Alcohol/week: 0.0 standard drinks  ? Drug use: No  ? Sexual activity: Not Currently  ?  Partners: Male  ?  Birth control/protection: Surgical  ?  Comment:  TAH/supracervical  ?Other Topics Concern  ? Not on file  ?Social History Narrative  ? Not on file  ? ?Social Determinants of Health  ? ?Financial Resource Strain: Not on file  ?Food Insecurity: Not on file

## 2021-06-04 ENCOUNTER — Other Ambulatory Visit (HOSPITAL_BASED_OUTPATIENT_CLINIC_OR_DEPARTMENT_OTHER): Payer: Self-pay

## 2021-06-11 DIAGNOSIS — M62830 Muscle spasm of back: Secondary | ICD-10-CM | POA: Diagnosis not present

## 2021-06-11 DIAGNOSIS — M47816 Spondylosis without myelopathy or radiculopathy, lumbar region: Secondary | ICD-10-CM | POA: Diagnosis not present

## 2021-06-11 DIAGNOSIS — M542 Cervicalgia: Secondary | ICD-10-CM | POA: Diagnosis not present

## 2021-06-11 DIAGNOSIS — M47813 Spondylosis without myelopathy or radiculopathy, cervicothoracic region: Secondary | ICD-10-CM | POA: Diagnosis not present

## 2021-06-13 ENCOUNTER — Ambulatory Visit: Payer: 59 | Admitting: Sports Medicine

## 2021-06-13 ENCOUNTER — Ambulatory Visit: Payer: Self-pay

## 2021-06-13 ENCOUNTER — Other Ambulatory Visit (HOSPITAL_BASED_OUTPATIENT_CLINIC_OR_DEPARTMENT_OTHER): Payer: Self-pay

## 2021-06-13 ENCOUNTER — Ambulatory Visit (INDEPENDENT_AMBULATORY_CARE_PROVIDER_SITE_OTHER): Payer: 59

## 2021-06-13 DIAGNOSIS — G4762 Sleep related leg cramps: Secondary | ICD-10-CM | POA: Diagnosis not present

## 2021-06-13 DIAGNOSIS — M722 Plantar fascial fibromatosis: Secondary | ICD-10-CM

## 2021-06-13 MED ORDER — MAGNESIUM OXIDE -MG SUPPLEMENT 400 (240 MG) MG PO TABS
800.0000 mg | ORAL_TABLET | Freq: Every day | ORAL | 3 refills | Status: DC
  Filled 2021-06-13: qty 120, 60d supply, fill #0

## 2021-06-13 NOTE — Progress Notes (Signed)
    Procedures performed today:    Procedure: Real-time Ultrasound Guided injection of the right plantar fascia origin Device: Samsung HS60  Verbal informed consent obtained.  Time-out conducted.  Noted no overlying erythema, induration, or other signs of local infection.  Skin prepped in a sterile fashion.  Local anesthesia: Topical Ethyl chloride.  With sterile technique and under real time ultrasound guidance: Noted thickened plantar fascia, 1 cc Kenalog 40, 1 cc lidocaine, 1 cc bupivacaine injected easily Completed without difficulty  Advised to call if fevers/chills, erythema, induration, drainage, or persistent bleeding.  Images permanently stored and available for review in PACS.  Impression: Technically successful ultrasound guided injection.  Independent interpretation of notes and tests performed by another provider:   None.  Brief History, Exam, Impression, and Recommendations:    Plantar fasciitis, bilateral Very pleasant 61 year old female, known bilateral plantar fasciitis, last injected 9 months ago, she is doing the conservative stuff, recurrence of pain mostly on the right, right side injected today. Return as needed.  Nocturnal leg cramps Also complaining of bilateral leg cramps in the calves, with toe splaying, back pain. This is typically related to lumbar spinal stenosis, adding magnesium oxide at night. If insufficient improvement we will proceed with lumbar spine imaging.   Chronic process with exacerbation and pharmacologic intervention  ___________________________________________ Maria Nunez. Dianah Field, M.D., ABFM., CAQSM. Primary Care and [place] Instructor of [place] [employer] [facility] [employer]

## 2021-06-13 NOTE — Assessment & Plan Note (Addendum)
Very pleasant 61 year old female, known bilateral plantar fasciitis, last injected 9 months ago, she is doing the conservative stuff, recurrence of pain mostly on the right, right side injected today. Return as needed.

## 2021-06-13 NOTE — Assessment & Plan Note (Signed)
Also complaining of bilateral leg cramps in the calves, with toe splaying, back pain. This is typically related to lumbar spinal stenosis, adding magnesium oxide at night. If insufficient improvement we will proceed with lumbar spine imaging.

## 2021-06-17 NOTE — Progress Notes (Unsigned)
GYNECOLOGY  VISIT   HPI: 61 y.o.   Divorced  Caucasian  female   [phone] with Patient's last menstrual period was 01/27/2000 (approximate).   here for 4 weeks status post LOOP ELECTROSURGICAL EXCISION PROCEDURE (LEEP), ENDOCERVICAL CURETTAGE (Vagina )  COLPOSCOPY (Vagina )  Had inflammation at her time of the post op visit on 06/03/21.  Tx with Flagyl.  Little bit of cramping, but nothing persistent.   GYNECOLOGIC HISTORY: Patient's last menstrual period was 01/27/2000 (approximate). Contraception:  SUPRACERVICAL HYST Menopausal hormone therapy:  none Last mammogram:  10/02/20-neg birads 1 Last pap smear: 04/11/21-unsatisfactory, 08/19/20-WNL, HR HPV+, 07/11/18-WNL, HPV- neg.         OB History     Gravida  3   Para  2   Term  2   Preterm      AB  1   Living  2      SAB  1   IAB      Ectopic      Multiple      Live Births                 Patient Active Problem List   Diagnosis Date Noted   Nocturnal leg cramps 06/13/2021   CKD (chronic kidney disease) stage 3, GFR 30-59 ml/min (HCC) 06/26/2020   Morbid obesity ([place]) 05/30/2020   Screening for malignant neoplasm of colon 05/30/2020   Gastroesophageal reflux disease without esophagitis 05/16/2020   Atypical chest pain 05/16/2020   Normocytic anemia 05/08/2020   Fatty liver 05/01/2020   Decreased appetite 04/22/2020   Non-intractable vomiting 04/22/2020   OSA (obstructive sleep apnea) 02/08/2020   Plantar fasciitis, bilateral 09/19/2019   Pain in right hand 02/21/2018   Bursitis of left shoulder 01/24/2016   GAD (generalized anxiety disorder) 04/02/2015   Localized primary osteoarthritis of carpometacarpal joint of right thumb 03/15/2015   Carpal tunnel syndrome, bilateral 04/05/2013   LGSIL (low grade squamous intraepithelial dysplasia) 06/16/2012   Type 2 diabetes mellitus with other specified complication ([place]) 36/64/4034   PANIC ATTACK 09/07/2008   RESTLESS LEG SYNDROME 04/02/2008   POLYARTHRITIS  04/02/2008   Primary osteoarthritis of left knee 01/02/2008   HOT FLASHES 08/04/2006   Hyperlipidemia associated with type 2 diabetes mellitus ([place]) 11/03/2005   HYPERTENSION, BENIGN SYSTEMIC 11/03/2005    Past Medical History:  Diagnosis Date   Abnormal ultrasound of ovary 2016   Ultrasound showing bilateral ovaries in contact with one another.  Asymptomatic.   Allergy    multiple med allergies   Anemia 04/29/2020   Arthritis    left knee   Bursitis 01/24/2016   left shoulder   Chest pain 01/29/2021   See 01/29/21 ED note in Epic. Pain significantly improved w/ NTG. CBC, CMP, troponin, BNP unremarkable. EKG showed NSR w/o acute ST changes. 02/19/21 [facility] Pharm Stress test - normal.   Chest pain 01/13/2019   Troponin negative x 2, Cxray normal, EKG sinus rhythm w/ borderline T abnormality. Discharged from ER w/ instruction to f/u w/PCP.   Chronic back pain    Chronic kidney disease    Stage 3, follows w/PCP   Complication of anesthesia    Pt states that one time she was told that she was given general anesthesia too fast. She woke up covered in blotches and was given IV Benadryl per pt.   COVID-19 03/05/2021   congestion, fever, all symptoms resolved as of 05/16/21   Diabetes mellitus    Type 2   Fatty  liver    Fibroid    hysteretomy in 2002   GERD (gastroesophageal reflux disease)    Patient is taking Pepcid as of 05/16/21.   Heart murmur    very long time ago per pt on 05/16/21   Hidradenitis    years ago per pt on 05/16/21   High cholesterol    follows w/ PCP Dr. Beatrice Lecher   Hypertension    follows with PCP Dr. Beatrice Lecher   Plantar fasciitis, bilateral 09/24/2020   Sleep apnea    wears CPAP   Spondylosis 2023   lumbar, cervicothoracic region with cervicalgia   Wears contact lenses    Wears glasses     Past Surgical History:  Procedure Laterality Date   BREAST EXCISIONAL BIOPSY Right 2020   BREAST SURGERY     cysts removed right breast,  around 20 years ago per pt on 05/16/21   BREAST SURGERY  08/2018   benign mass   CARPAL TUNNEL RELEASE Right 01/08/2017   Procedure: CARPAL TUNNEL RELEASE, POSSIBLE RIGHT THUMB CARPOMETACARPAL INJECTION;  Surgeon: Roseanne Kaufman, MD;  Location: [place];  Service: Orthopedics;  Laterality: Right;  [place] N/A 05/19/2021   Procedure: COLPOSCOPY;  Surgeon: Nunzio Cobbs, MD;  Location[facility];  Service: Gynecology;  Laterality: N/A;   ESOPHAGOGASTRODUODENOSCOPY  07/19/2020   in Epic   INGUINAL HIDRADENITIS EXCISION     many years ago per pt on 05/16/21   LEEP  05/04/2013   HPV change   LEEP N/A 05/19/2021   Procedure: LOOP ELECTROSURGICAL EXCISION PROCEDURE (LEEP), ENDOCERVICAL CURETTAGE;  Surgeon: Nunzio Cobbs, MD;  Location: [facility];  Service: Gynecology;  Laterality: N/A;   SUPRACERVICAL ABDOMINAL HYSTERECTOMY  2002   Done for bleeding, fibroids in [place]    Current Outpatient Medications  Medication Sig Dispense Refill   AMBULATORY NON FORMULARY MEDICATION Set CPAP to 10 cm water pressure. Need download in 2 weeks after change of setting. 1 each 0   Ascorbic Acid (VITAMIN C) 1000 MG tablet Take 1 tablet by mouth daily.     Blood Glucose Monitoring Suppl (FREESTYLE LITE) DEVI      Cyanocobalamin (B-12) 500 MCG TABS Take 1 tablet by mouth daily.     famotidine (PEPCID) 40 MG tablet Take 1 tablet by mouth two times daily. (Patient taking differently: Take 40 mg by mouth 2 (two) times daily. Usually just takes once daily.) 242 tablet 1   FOLIC ACID PO Take 683 mcg by mouth daily.     glucose blood (FREESTYLE LITE) test strip USE TO TEST BLOOD SUGARS TWICE DAILY 400 strip 12   Lancets (FREESTYLE) lancets For use when testing blood sugars daily. DX:E11.69 200 each 12   magnesium oxide (MAG-OX) 400 (240 Mg) MG tablet Take 2 tablets (800 mg total) by mouth at bedtime. 120  tablet 3   melatonin 1 MG TABS tablet Take 1 mg by mouth at bedtime.     metFORMIN (GLUCOPHAGE) 1000 MG tablet Take 1 tablet (1,000 mg total) by mouth daily with breakfast. 90 tablet 3   metroNIDAZOLE (FLAGYL) 500 MG tablet Take 1 tablet (500 mg total) by mouth 2 (two) times daily. 14 tablet 0   pyridOXINE (VITAMIN B-6) 100 MG tablet Take 100 mg by mouth daily.     rosuvastatin (CRESTOR) 40 MG tablet TAKE 1 TABLET (40 MG TOTAL)  BY MOUTH DAILY. (Patient taking differently: Take by mouth every evening.) 90 tablet 3   valsartan (DIOVAN) 40 MG tablet Take 1 tablet (40 mg total) by mouth daily. 90 tablet 3   Zinc Sulfate (ZINC 15 PO) Take by mouth.     No current facility-administered medications for this visit.     ALLERGIES: Sulfonamide derivatives, Xigduo xr [dapagliflozin-metformin hcl er], Hycodan [hydrocodone bit-homatrop mbr], Omeprazole, Cefaclor, Cephalexin, Januvia [sitagliptin], Levofloxacin, Penicillins, Tetracycline, and Victoza [liraglutide]  Family History  Problem Relation Age of Onset   Asthma Mother    COPD Mother    Diabetes Mother    Hypertension Mother    Diabetes Father    Hypertension Father    Breast cancer Maternal Aunt    Breast cancer Paternal Aunt    Colon cancer Neg Hx    Pancreatic cancer Neg Hx    Esophageal cancer Neg Hx    Stomach cancer Neg Hx    Liver disease Neg Hx     Social History   Socioeconomic History   Marital status: Divorced    Spouse name: Not on file   Number of children: Not on file   Years of education: Not on file   Highest education level: Not on file  Occupational History   Not on file  Tobacco Use   Smoking status: Never   Smokeless tobacco: Never  Vaping Use   Vaping Use: Never used  Substance and Sexual Activity   Alcohol use: No    Alcohol/week: 0.0 standard drinks   Drug use: No   Sexual activity: Not Currently    Partners: Male    Birth control/protection: Surgical    Comment: TAH/supracervical  Other Topics  Concern   Not on file  Social History Narrative   Not on file   Social Determinants of Health   Financial Resource Strain: Not on file  Food Insecurity: Not on file  Transportation Needs: Not on file  Physical Activity: Not on file  Stress: Not on file  Social Connections: Not on file  Intimate Partner Violence: Not on file    Review of Systems  All other systems reviewed and are negative.  PHYSICAL EXAMINATION:    BP (!) 152/76   Pulse 69   Ht 5' (1.524 m)   Wt 163 lb (73.9 kg)   LMP 01/27/2000 (Approximate) Comment: supracervical hysterectomy  SpO2 96%   BMI 31.83 kg/m     General appearance: alert, cooperative and appears stated age  Pelvic: External genitalia:  no lesions              Urethra:  normal appearing urethra with no masses, tenderness or lesions              Bartholins and Skenes: normal                 Vagina: normal appearing vagina with normal color and discharge, no lesions              Cervix: flush with vaginal apex.                  Bimanual Exam:  Uterus: absent.                                Agglutination of vaginal walls. Bleeding after speculum and bimanual exam.               Adnexa: no mass, fullness,  tenderness             Chaperone was present for exam:  Estill Bamberg, CMA  ASSESSMENT  Status post LEEP.   Final pathology LGSIL. Vaginal adhesions post LEEP.  Vaginal inflammation.   PLAN  I am recommending vaginal estrogen cream 1/2 gram every other day and a recheck in 1 week.    An After Visit Summary was printed and given to the patient.  18 min  total time was spent for this patient encounter, including preparation, face-to-face counseling with the patient, coordination of care, and documentation of the encounter.

## 2021-06-18 ENCOUNTER — Other Ambulatory Visit (HOSPITAL_BASED_OUTPATIENT_CLINIC_OR_DEPARTMENT_OTHER): Payer: Self-pay

## 2021-06-18 ENCOUNTER — Ambulatory Visit (INDEPENDENT_AMBULATORY_CARE_PROVIDER_SITE_OTHER): Payer: 59 | Admitting: Obstetrics and Gynecology

## 2021-06-18 ENCOUNTER — Encounter: Payer: Self-pay | Admitting: Obstetrics and Gynecology

## 2021-06-18 VITALS — BP 152/76 | HR 69 | Ht 60.0 in | Wt 163.0 lb

## 2021-06-18 DIAGNOSIS — Z9889 Other specified postprocedural states: Secondary | ICD-10-CM

## 2021-06-18 DIAGNOSIS — N992 Postprocedural adhesions of vagina: Secondary | ICD-10-CM

## 2021-06-18 MED ORDER — ESTRADIOL 0.1 MG/GM VA CREA
TOPICAL_CREAM | VAGINAL | 1 refills | Status: DC
  Filled 2021-06-18: qty 42.5, 90d supply, fill #0

## 2021-06-18 NOTE — Progress Notes (Signed)
GYNECOLOGY  VISIT   HPI: 61 y.o.   Divorced  Caucasian  female   [phone] with Patient's last menstrual period was 01/27/2000 (approximate).   here for post LEEP follow up.  Patient is being followed for vaginal agglutination and inflammation following the procedure.  She has been treated with a course of Flagyl and is using vaginal estrogen cream.  Having some pelvic cramping today.  Feels like a period.   Using vaginal estrogen 1/2 gram pv at hs three times a week.   Blood sugar level is 126 - 130 in the am and 105 at night.    GYNECOLOGIC HISTORY: Patient's last menstrual period was 01/27/2000 (approximate). Contraception:supra cervical hyst Menopausal hormone therapy:  none Last mammogram:  10-02-20 normal BiRADS 1 Last pap smear:   04-11-21 Scant cellularity        OB History     Gravida  3   Para  2   Term  2   Preterm      AB  1   Living  2      SAB  1   IAB      Ectopic      Multiple      Live Births                 Patient Active Problem List   Diagnosis Date Noted   Nocturnal leg cramps 06/13/2021   CKD (chronic kidney disease) stage 3, GFR 30-59 ml/min (HCC) 06/26/2020   Morbid obesity ([place]) 05/30/2020   Screening for malignant neoplasm of colon 05/30/2020   Gastroesophageal reflux disease without esophagitis 05/16/2020   Atypical chest pain 05/16/2020   Normocytic anemia 05/08/2020   Fatty liver 05/01/2020   Decreased appetite 04/22/2020   Non-intractable vomiting 04/22/2020   OSA (obstructive sleep apnea) 02/08/2020   Plantar fasciitis, bilateral 09/19/2019   Pain in right hand 02/21/2018   Bursitis of left shoulder 01/24/2016   GAD (generalized anxiety disorder) 04/02/2015   Localized primary osteoarthritis of carpometacarpal joint of right thumb 03/15/2015   Carpal tunnel syndrome, bilateral 04/05/2013   LGSIL (low grade squamous intraepithelial dysplasia) 06/16/2012   Type 2 diabetes mellitus with other specified complication ([place])  79/89/2119   PANIC ATTACK 09/07/2008   RESTLESS LEG SYNDROME 04/02/2008   POLYARTHRITIS 04/02/2008   Primary osteoarthritis of left knee 01/02/2008   HOT FLASHES 08/04/2006   Hyperlipidemia associated with type 2 diabetes mellitus ([place]) 11/03/2005   HYPERTENSION, BENIGN SYSTEMIC 11/03/2005    Past Medical History:  Diagnosis Date   Abnormal ultrasound of ovary 2016   Ultrasound showing bilateral ovaries in contact with one another.  Asymptomatic.   Allergy    multiple med allergies   Anemia 04/29/2020   Arthritis    left knee   Bursitis 01/24/2016   left shoulder   Chest pain 01/29/2021   See 01/29/21 ED note in Epic. Pain significantly improved w/ NTG. CBC, CMP, troponin, BNP unremarkable. EKG showed NSR w/o acute ST changes. 02/19/21 [facility] Stress test - normal.   Chest pain 01/13/2019   Troponin negative x 2, Cxray normal, EKG sinus rhythm w/ borderline T abnormality. Discharged from ER w/ instruction to f/u w/PCP.   Chronic back pain    Chronic kidney disease    Stage 3, follows w/PCP   Complication of anesthesia    Pt states that one time she was told that she was given general anesthesia too fast. She woke up covered in blotches and was given  IV Benadryl per pt.   COVID-19 03/05/2021   congestion, fever, all symptoms resolved as of 05/16/21   Diabetes mellitus    Type 2   Fatty liver    Fibroid    hysteretomy in 2002   GERD (gastroesophageal reflux disease)    Patient is taking Pepcid as of 05/16/21.   Heart murmur    very long time ago per pt on 05/16/21   Hidradenitis    years ago per pt on 05/16/21   High cholesterol    follows w/ PCP Dr. Beatrice Lecher   Hypertension    follows with PCP Dr. Beatrice Lecher   Plantar fasciitis, bilateral 09/24/2020   Sleep apnea    wears CPAP   Spondylosis 2023   lumbar, cervicothoracic region with cervicalgia   Wears contact lenses    Wears glasses     Past Surgical History:  Procedure Laterality Date    BREAST EXCISIONAL BIOPSY Right 2020   BREAST SURGERY     cysts removed right breast, around 20 years ago per pt on 05/16/21   BREAST SURGERY  08/2018   benign mass   CARPAL TUNNEL RELEASE Right 01/08/2017   Procedure: CARPAL TUNNEL RELEASE, POSSIBLE RIGHT THUMB CARPOMETACARPAL INJECTION;  Surgeon: Roseanne Kaufman, MD;  Location: [facility];  Service: Orthopedics;  Laterality: Right;  [place] N/A 05/19/2021   Procedure: COLPOSCOPY;  Surgeon: Nunzio Cobbs, MD;  Location: [facility];  Service: Gynecology;  Laterality: N/A;   ESOPHAGOGASTRODUODENOSCOPY  07/19/2020   in Epic   INGUINAL HIDRADENITIS EXCISION     many years ago per pt on 05/16/21   LEEP  05/04/2013   HPV change   LEEP N/A 05/19/2021   Procedure: LOOP ELECTROSURGICAL EXCISION PROCEDURE (LEEP), ENDOCERVICAL CURETTAGE;  Surgeon: Nunzio Cobbs, MD;  Location[facility];  Service: Gynecology;  Laterality: N/A;   SUPRACERVICAL ABDOMINAL HYSTERECTOMY  2002   Done for bleeding, fibroids in [place]    Current Outpatient Medications  Medication Sig Dispense Refill   AMBULATORY NON FORMULARY MEDICATION Set CPAP to 10 cm water pressure. Need download in 2 weeks after change of setting. 1 each 0   Ascorbic Acid (VITAMIN C) 1000 MG tablet Take 1 tablet by mouth daily.     Blood Glucose Monitoring Suppl (FREESTYLE LITE) DEVI      Cyanocobalamin (B-12) 500 MCG TABS Take 1 tablet by mouth daily.     estradiol (ESTRACE) 0.1 MG/GM vaginal cream Insert 1/2 gram vaginally every other night. 42.5 g 1   famotidine (PEPCID) 40 MG tablet Take 1 tablet by mouth two times daily. (Patient taking differently: Take 40 mg by mouth 2 (two) times daily. Usually just takes once daily.) 326 tablet 1   FOLIC ACID PO Take 712 mcg by mouth daily.     glucose blood (FREESTYLE LITE) test strip USE TO TEST BLOOD SUGARS TWICE DAILY 400 strip 12   Lancets  (FREESTYLE) lancets For use when testing blood sugars daily. DX:E11.69 200 each 12   magnesium oxide (MAG-OX) 400 (240 Mg) MG tablet Take 2 tablets (800 mg total) by mouth at bedtime. 120 tablet 3   melatonin 1 MG TABS tablet Take 1 mg by mouth at bedtime.     metFORMIN (GLUCOPHAGE) 1000 MG tablet Take 1 tablet (1,000 mg total) by mouth daily with breakfast. 90 tablet 3   pyridOXINE (  VITAMIN B-6) 100 MG tablet Take 100 mg by mouth daily.     rosuvastatin (CRESTOR) 40 MG tablet TAKE 1 TABLET (40 MG TOTAL) BY MOUTH DAILY. (Patient taking differently: Take by mouth every evening.) 90 tablet 3   valsartan (DIOVAN) 40 MG tablet Take 1 tablet (40 mg total) by mouth daily. 90 tablet 3   Zinc Sulfate (ZINC 15 PO) Take by mouth.     No current facility-administered medications for this visit.     ALLERGIES: Sulfonamide derivatives, Xigduo xr [dapagliflozin-metformin hcl er], Hycodan [hydrocodone bit-homatrop mbr], Omeprazole, Cefaclor, Cephalexin, Januvia [sitagliptin], Levofloxacin, Penicillins, Tetracycline, and Victoza [liraglutide]  Family History  Problem Relation Age of Onset   Asthma Mother    COPD Mother    Diabetes Mother    Hypertension Mother    Diabetes Father    Hypertension Father    Breast cancer Maternal Aunt    Breast cancer Paternal Aunt    Colon cancer Neg Hx    Pancreatic cancer Neg Hx    Esophageal cancer Neg Hx    Stomach cancer Neg Hx    Liver disease Neg Hx     Social History   Socioeconomic History   Marital status: Divorced    Spouse name: Not on file   Number of children: Not on file   Years of education: Not on file   Highest education level: Not on file  Occupational History   Not on file  Tobacco Use   Smoking status: Never   Smokeless tobacco: Never  Vaping Use   Vaping Use: Never used  Substance and Sexual Activity   Alcohol use: No    Alcohol/week: 0.0 standard drinks   Drug use: No   Sexual activity: Not Currently    Partners: Male    Birth  control/protection: Surgical    Comment: TAH/supracervical  Other Topics Concern   Not on file  Social History Narrative   Not on file   Social Determinants of Health   Financial Resource Strain: Not on file  Food Insecurity: Not on file  Transportation Needs: Not on file  Physical Activity: Not on file  Stress: Not on file  Social Connections: Not on file  Intimate Partner Violence: Not on file    Review of Systems  PHYSICAL EXAMINATION:    BP 122/82 (BP Location: Right Arm, Patient Position: Sitting, Cuff Size: Normal)   LMP 01/27/2000 (Approximate) Comment: supracervical hysterectomy    General appearance: alert, cooperative and appears stated age    Pelvic: External genitalia:  no lesions              Urethra:  normal appearing urethra with no masses, tenderness or lesions              Bartholins and Skenes: normal                 Vagina: normal appearing vagina with normal color and discharge, left vaginal wall atrophy.              Cervix: no lesions                Bimanual Exam:  Uterus:  absent.  Agglutination of the vaginal walls at the apex.  Mild bleeding noted on examining glove.              Adnexa: no mass, fullness, tenderness            Chaperone was present for exam:  Maudie Mercury, CMA  ASSESSMENT  Status  post LEEP.  Vaginal atrophy.  Vaginal agglutination.   PLAN  Increase to estrogen cream 1 gram three times per week.  Fu in one week.    An After Visit Summary was printed and given to the patient.

## 2021-06-20 ENCOUNTER — Ambulatory Visit: Payer: 59 | Admitting: Family Medicine

## 2021-06-24 NOTE — Progress Notes (Unsigned)
GYNECOLOGY  VISIT   HPI: 61 y.o.   Divorced  Caucasian  female   [phone] with Patient's last menstrual period was 01/27/2000 (approximate).   here for 5 weeks status post LOOP ELECTROSURGICAL EXCISION PROCEDURE (LEEP), ENDOCERVICAL CURETTAGE (Vagina )  COLPOSCOPY (Vagina ).  Using the vaginal estrogen cream 1/2 gram three times a week.   She has vaginal agglutination at her last office visit.     GYNECOLOGIC HISTORY: Patient's last menstrual period was 01/27/2000 (approximate). Contraception:  SUPRACERVICAL HYST Menopausal hormone therapy:  NONE Last mammogram:   10/02/20-neg birads 1 Last pap smear:  04/11/21-unsatisfactory, 08/19/20-WNL, HR HPV+, 07/11/18-WNL, HPV- neg.         OB History     Gravida  3   Para  2   Term  2   Preterm      AB  1   Living  2      SAB  1   IAB      Ectopic      Multiple      Live Births                 Patient Active Problem List   Diagnosis Date Noted   Nocturnal leg cramps 06/13/2021   CKD (chronic kidney disease) stage 3, GFR 30-59 ml/min (HCC) 06/26/2020   Morbid obesity ([place]) 05/30/2020   Screening for malignant neoplasm of colon 05/30/2020   Gastroesophageal reflux disease without esophagitis 05/16/2020   Atypical chest pain 05/16/2020   Normocytic anemia 05/08/2020   Fatty liver 05/01/2020   Decreased appetite 04/22/2020   Non-intractable vomiting 04/22/2020   OSA (obstructive sleep apnea) 02/08/2020   Plantar fasciitis, bilateral 09/19/2019   Pain in right hand 02/21/2018   Bursitis of left shoulder 01/24/2016   GAD (generalized anxiety disorder) 04/02/2015   Localized primary osteoarthritis of carpometacarpal joint of right thumb 03/15/2015   Carpal tunnel syndrome, bilateral 04/05/2013   LGSIL (low grade squamous intraepithelial dysplasia) 06/16/2012   Type 2 diabetes mellitus with other specified complication ([place]) 71/06/2692   PANIC ATTACK 09/07/2008   RESTLESS LEG SYNDROME 04/02/2008   POLYARTHRITIS  04/02/2008   Primary osteoarthritis of left knee 01/02/2008   HOT FLASHES 08/04/2006   Hyperlipidemia associated with type 2 diabetes mellitus ([place]) 11/03/2005   HYPERTENSION, BENIGN SYSTEMIC 11/03/2005    Past Medical History:  Diagnosis Date   Abnormal ultrasound of ovary 2016   Ultrasound showing bilateral ovaries in contact with one another.  Asymptomatic.   Allergy    multiple med allergies   Anemia 04/29/2020   Arthritis    left knee   Bursitis 01/24/2016   left shoulder   Chest pain 01/29/2021   See 01/29/21 ED note in Epic. Pain significantly improved w/ NTG. CBC, CMP, troponin, BNP unremarkable. EKG showed NSR w/o acute ST changes. 02/19/21 [facility] Stress test - normal.   Chest pain 01/13/2019   Troponin negative x 2, Cxray normal, EKG sinus rhythm w/ borderline T abnormality. Discharged from ER w/ instruction to f/u w/PCP.   Chronic back pain    Chronic kidney disease    Stage 3, follows w/PCP   Complication of anesthesia    Pt states that one time she was told that she was given general anesthesia too fast. She woke up covered in blotches and was given IV Benadryl per pt.   COVID-19 03/05/2021   congestion, fever, all symptoms resolved as of 05/16/21   Diabetes mellitus    Type 2  Fatty liver    Fibroid    hysteretomy in 2002   GERD (gastroesophageal reflux disease)    Patient is taking Pepcid as of 05/16/21.   Heart murmur    very long time ago per pt on 05/16/21   Hidradenitis    years ago per pt on 05/16/21   High cholesterol    follows w/ PCP Dr. Beatrice Lecher   Hypertension    follows with PCP Dr. Beatrice Lecher   Plantar fasciitis, bilateral 09/24/2020   Sleep apnea    wears CPAP   Spondylosis 2023   lumbar, cervicothoracic region with cervicalgia   Wears contact lenses    Wears glasses     Past Surgical History:  Procedure Laterality Date   BREAST EXCISIONAL BIOPSY Right 2020   BREAST SURGERY     cysts removed right breast,  around 20 years ago per pt on 05/16/21   BREAST SURGERY  08/2018   benign mass   CARPAL TUNNEL RELEASE Right 01/08/2017   Procedure: CARPAL TUNNEL RELEASE, POSSIBLE RIGHT THUMB CARPOMETACARPAL INJECTION;  Surgeon: Roseanne Kaufman, MD;  Location: [facility];  Service: Orthopedics;  Laterality: Right;  [place] N/A 05/19/2021   Procedure: COLPOSCOPY;  Surgeon: Nunzio Cobbs, MD;  Location: [facility];  Service: Gynecology;  Laterality: N/A;   ESOPHAGOGASTRODUODENOSCOPY  07/19/2020   in Epic   INGUINAL HIDRADENITIS EXCISION     many years ago per pt on 05/16/21   LEEP  05/04/2013   HPV change   LEEP N/A 05/19/2021   Procedure: LOOP ELECTROSURGICAL EXCISION PROCEDURE (LEEP), ENDOCERVICAL CURETTAGE;  Surgeon: Nunzio Cobbs, MD;  Location[facility];  Service: Gynecology;  Laterality: N/A;   SUPRACERVICAL ABDOMINAL HYSTERECTOMY  2002   Done for bleeding, fibroids in [place]    Current Outpatient Medications  Medication Sig Dispense Refill   AMBULATORY NON FORMULARY MEDICATION Set CPAP to 10 cm water pressure. Need download in 2 weeks after change of setting. 1 each 0   Ascorbic Acid (VITAMIN C) 1000 MG tablet Take 1 tablet by mouth daily.     Blood Glucose Monitoring Suppl (FREESTYLE LITE) DEVI      Cyanocobalamin (B-12) 500 MCG TABS Take 1 tablet by mouth daily.     estradiol (ESTRACE) 0.1 MG/GM vaginal cream Insert 1/2 gram vaginally every other night. 42.5 g 1   famotidine (PEPCID) 40 MG tablet Take 1 tablet by mouth two times daily. (Patient taking differently: Take 40 mg by mouth 2 (two) times daily. Usually just takes once daily.) 244 tablet 1   FOLIC ACID PO Take 010 mcg by mouth daily.     glucose blood (FREESTYLE LITE) test strip USE TO TEST BLOOD SUGARS TWICE DAILY 400 strip 12   Lancets (FREESTYLE) lancets For use when testing blood sugars daily. DX:E11.69 200 each 12    magnesium oxide (MAG-OX) 400 (240 Mg) MG tablet Take 2 tablets (800 mg total) by mouth at bedtime. 120 tablet 3   melatonin 1 MG TABS tablet Take 1 mg by mouth at bedtime.     metFORMIN (GLUCOPHAGE) 1000 MG tablet Take 1 tablet (1,000 mg total) by mouth daily with breakfast. 90 tablet 3   metroNIDAZOLE (FLAGYL) 500 MG tablet Take 1 tablet (500 mg total) by mouth 2 (two) times daily. 14 tablet 0   pyridOXINE (VITAMIN B-6) 100 MG tablet Take 100  mg by mouth daily.     rosuvastatin (CRESTOR) 40 MG tablet TAKE 1 TABLET (40 MG TOTAL) BY MOUTH DAILY. (Patient taking differently: Take by mouth every evening.) 90 tablet 3   valsartan (DIOVAN) 40 MG tablet Take 1 tablet (40 mg total) by mouth daily. 90 tablet 3   Zinc Sulfate (ZINC 15 PO) Take by mouth.     No current facility-administered medications for this visit.     ALLERGIES: Sulfonamide derivatives, Xigduo xr [dapagliflozin-metformin hcl er], Hycodan [hydrocodone bit-homatrop mbr], Omeprazole, Cefaclor, Cephalexin, Januvia [sitagliptin], Levofloxacin, Penicillins, Tetracycline, and Victoza [liraglutide]  Family History  Problem Relation Age of Onset   Asthma Mother    COPD Mother    Diabetes Mother    Hypertension Mother    Diabetes Father    Hypertension Father    Breast cancer Maternal Aunt    Breast cancer Paternal Aunt    Colon cancer Neg Hx    Pancreatic cancer Neg Hx    Esophageal cancer Neg Hx    Stomach cancer Neg Hx    Liver disease Neg Hx     Social History   Socioeconomic History   Marital status: Divorced    Spouse name: Not on file   Number of children: Not on file   Years of education: Not on file   Highest education level: Not on file  Occupational History   Not on file  Tobacco Use   Smoking status: Never   Smokeless tobacco: Never  Vaping Use   Vaping Use: Never used  Substance and Sexual Activity   Alcohol use: No    Alcohol/week: 0.0 standard drinks   Drug use: No   Sexual activity: Not Currently     Partners: Male    Birth control/protection: Surgical    Comment: TAH/supracervical  Other Topics Concern   Not on file  Social History Narrative   Not on file   Social Determinants of Health   Financial Resource Strain: Not on file  Food Insecurity: Not on file  Transportation Needs: Not on file  Physical Activity: Not on file  Stress: Not on file  Social Connections: Not on file  Intimate Partner Violence: Not on file    Review of Systems  All other systems reviewed and are negative.  PHYSICAL EXAMINATION:    BP 128/80   Ht 5' (1.524 m)   Wt 163 lb (73.9 kg)   LMP 01/27/2000 (Approximate) Comment: supracervical hysterectomy  BMI 31.83 kg/m     General appearance: alert, cooperative and appears stated age Head: Normocephalic, without obvious abnormality, atraumatic Neck: no adenopathy, supple, symmetrical, trachea midline and thyroid normal to inspection and palpation Lungs: clear to auscultation bilaterally Breasts: normal appearance, no masses or tenderness, No nipple retraction or dimpling, No nipple discharge or bleeding, No axillary or supraclavicular adenopathy Heart: regular rate and rhythm Abdomen: soft, non-tender, no masses,  no organomegaly Extremities: extremities normal, atraumatic, no cyanosis or edema Skin: Skin color, texture, turgor normal. No rashes or lesions Lymph nodes: Cervical, supraclavicular, and axillary nodes normal. No abnormal inguinal nodes palpated Neurologic: Grossly normal  Pelvic: External genitalia:  no lesions              Urethra:  normal appearing urethra with no masses, tenderness or lesions              Bartholins and Skenes: normal                 Vagina: normal appearing vagina with normal  color and discharge, no lesions              Cervix: no lesions                Bimanual Exam:  Uterus:  normal size, contour, position, consistency, mobility, non-tender              Adnexa: no mass, fullness, tenderness               Rectal exam: {yes no:[id number]}.  Confirms.              Anus:  normal sphincter tone, no lesions  Chaperone was present for exam:  ***  ASSESSMENT     PLAN     An After Visit Summary was printed and given to the patient.  ______ minutes face to face time of which over 50% was spent in counseling.

## 2021-06-25 ENCOUNTER — Ambulatory Visit (INDEPENDENT_AMBULATORY_CARE_PROVIDER_SITE_OTHER): Payer: 59 | Admitting: Obstetrics and Gynecology

## 2021-06-25 ENCOUNTER — Encounter: Payer: Self-pay | Admitting: Obstetrics and Gynecology

## 2021-06-25 VITALS — BP 128/80 | Ht 60.0 in | Wt 163.0 lb

## 2021-06-25 DIAGNOSIS — Z9889 Other specified postprocedural states: Secondary | ICD-10-CM | POA: Diagnosis not present

## 2021-07-02 ENCOUNTER — Ambulatory Visit (INDEPENDENT_AMBULATORY_CARE_PROVIDER_SITE_OTHER): Payer: 59 | Admitting: Obstetrics and Gynecology

## 2021-07-02 VITALS — BP 122/82

## 2021-07-02 DIAGNOSIS — Z79890 Hormone replacement therapy (postmenopausal): Secondary | ICD-10-CM | POA: Diagnosis not present

## 2021-07-02 DIAGNOSIS — N898 Other specified noninflammatory disorders of vagina: Secondary | ICD-10-CM | POA: Diagnosis not present

## 2021-07-02 DIAGNOSIS — N952 Postmenopausal atrophic vaginitis: Secondary | ICD-10-CM

## 2021-07-02 DIAGNOSIS — Z9889 Other specified postprocedural states: Secondary | ICD-10-CM

## 2021-07-05 ENCOUNTER — Encounter: Payer: Self-pay | Admitting: Obstetrics and Gynecology

## 2021-07-06 DIAGNOSIS — R0989 Other specified symptoms and signs involving the circulatory and respiratory systems: Secondary | ICD-10-CM | POA: Diagnosis not present

## 2021-07-06 DIAGNOSIS — Z0130 Encounter for examination of blood pressure without abnormal findings: Secondary | ICD-10-CM | POA: Diagnosis not present

## 2021-07-06 DIAGNOSIS — I10 Essential (primary) hypertension: Secondary | ICD-10-CM | POA: Diagnosis not present

## 2021-07-07 NOTE — Progress Notes (Deleted)
GYNECOLOGY  VISIT   HPI: 61 y.o.   Divorced  Caucasian  female   [phone] with Patient's last menstrual period was 01/27/2000 (approximate).   here for 7 weeks status post LOOP ELECTROSURGICAL EXCISION PROCEDURE (LEEP), ENDOCERVICAL CURETTAGE (Vagina )  COLPOSCOPY (Vagina ).  GYNECOLOGIC HISTORY: Patient's last menstrual period was 01/27/2000 (approximate). Contraception:  SUPRACERVICAL HYST Menopausal hormone therapy:  none Last mammogram:    10-02-20 normal BiRADS 1 Last pap smear: 04-11-21 Scant cellularity         OB History     Gravida  3   Para  2   Term  2   Preterm      AB  1   Living  2      SAB  1   IAB      Ectopic      Multiple      Live Births                 Patient Active Problem List   Diagnosis Date Noted   Nocturnal leg cramps 06/13/2021   CKD (chronic kidney disease) stage 3, GFR 30-59 ml/min (HCC) 06/26/2020   Morbid obesity ([place]) 05/30/2020   Screening for malignant neoplasm of colon 05/30/2020   Gastroesophageal reflux disease without esophagitis 05/16/2020   Atypical chest pain 05/16/2020   Normocytic anemia 05/08/2020   Fatty liver 05/01/2020   Decreased appetite 04/22/2020   Non-intractable vomiting 04/22/2020   OSA (obstructive sleep apnea) 02/08/2020   Plantar fasciitis, bilateral 09/19/2019   Pain in right hand 02/21/2018   Bursitis of left shoulder 01/24/2016   GAD (generalized anxiety disorder) 04/02/2015   Localized primary osteoarthritis of carpometacarpal joint of right thumb 03/15/2015   Carpal tunnel syndrome, bilateral 04/05/2013   LGSIL (low grade squamous intraepithelial dysplasia) 06/16/2012   Type 2 diabetes mellitus with other specified complication ([place]) 14/78/2956   PANIC ATTACK 09/07/2008   RESTLESS LEG SYNDROME 04/02/2008   POLYARTHRITIS 04/02/2008   Primary osteoarthritis of left knee 01/02/2008   HOT FLASHES 08/04/2006   Hyperlipidemia associated with type 2 diabetes mellitus ([place]) 11/03/2005    HYPERTENSION, BENIGN SYSTEMIC 11/03/2005    Past Medical History:  Diagnosis Date   Abnormal ultrasound of ovary 2016   Ultrasound showing bilateral ovaries in contact with one another.  Asymptomatic.   Allergy    multiple med allergies   Anemia 04/29/2020   Arthritis    left knee   Bursitis 01/24/2016   left shoulder   Chest pain 01/29/2021   See 01/29/21 ED note in Epic. Pain significantly improved w/ NTG. CBC, CMP, troponin, BNP unremarkable. EKG showed NSR w/o acute ST changes. 02/19/21 [facility] Stress test - normal.   Chest pain 01/13/2019   Troponin negative x 2, Cxray normal, EKG sinus rhythm w/ borderline T abnormality. Discharged from ER w/ instruction to f/u w/PCP.   Chronic back pain    Chronic kidney disease    Stage 3, follows w/PCP   Complication of anesthesia    Pt states that one time she was told that she was given general anesthesia too fast. She woke up covered in blotches and was given IV Benadryl per pt.   COVID-19 03/05/2021   congestion, fever, all symptoms resolved as of 05/16/21   Diabetes mellitus    Type 2   Fatty liver    Fibroid    hysteretomy in 2002   GERD (gastroesophageal reflux disease)    Patient is taking Pepcid as of 05/16/21.  Heart murmur    very long time ago per pt on 05/16/21   Hidradenitis    years ago per pt on 05/16/21   High cholesterol    follows w/ PCP Dr. Beatrice Lecher   Hypertension    follows with PCP Dr. Beatrice Lecher   Plantar fasciitis, bilateral 09/24/2020   Sleep apnea    wears CPAP   Spondylosis 2023   lumbar, cervicothoracic region with cervicalgia   Wears contact lenses    Wears glasses     Past Surgical History:  Procedure Laterality Date   BREAST EXCISIONAL BIOPSY Right 2020   BREAST SURGERY     cysts removed right breast, around 20 years ago per pt on 05/16/21   BREAST SURGERY  08/2018   benign mass   CARPAL TUNNEL RELEASE Right 01/08/2017   Procedure: CARPAL TUNNEL RELEASE, POSSIBLE  RIGHT THUMB CARPOMETACARPAL INJECTION;  Surgeon: Roseanne Kaufman, MD;  Location: [facility];  Service: Orthopedics;  Laterality: Right;  [place] N/A 05/19/2021   Procedure: COLPOSCOPY;  Surgeon: Nunzio Cobbs, MD;  Location: [facility];  Service: Gynecology;  Laterality: N/A;   ESOPHAGOGASTRODUODENOSCOPY  07/19/2020   in Epic   INGUINAL HIDRADENITIS EXCISION     many years ago per pt on 05/16/21   LEEP  05/04/2013   HPV change   LEEP N/A 05/19/2021   Procedure: LOOP ELECTROSURGICAL EXCISION PROCEDURE (LEEP), ENDOCERVICAL CURETTAGE;  Surgeon: Nunzio Cobbs, MD;  Location: [facility];  Service: Gynecology;  Laterality: N/A;   SUPRACERVICAL ABDOMINAL HYSTERECTOMY  2002   Done for bleeding, fibroids in [place]    Current Outpatient Medications  Medication Sig Dispense Refill   AMBULATORY NON FORMULARY MEDICATION Set CPAP to 10 cm water pressure. Need download in 2 weeks after change of setting. 1 each 0   Ascorbic Acid (VITAMIN C) 1000 MG tablet Take 1 tablet by mouth daily.     Blood Glucose Monitoring Suppl (FREESTYLE LITE) DEVI      Cyanocobalamin (B-12) 500 MCG TABS Take 1 tablet by mouth daily.     estradiol (ESTRACE) 0.1 MG/GM vaginal cream Insert 1/2 gram vaginally every other night. 42.5 g 1   famotidine (PEPCID) 40 MG tablet Take 1 tablet by mouth two times daily. (Patient taking differently: Take 40 mg by mouth 2 (two) times daily. Usually just takes once daily.) 782 tablet 1   FOLIC ACID PO Take 956 mcg by mouth daily.     glucose blood (FREESTYLE LITE) test strip USE TO TEST BLOOD SUGARS TWICE DAILY 400 strip 12   Lancets (FREESTYLE) lancets For use when testing blood sugars daily. DX:E11.69 200 each 12   magnesium oxide (MAG-OX) 400 (240 Mg) MG tablet Take 2 tablets (800 mg total) by mouth at bedtime. 120 tablet 3   melatonin 1 MG TABS tablet Take 1 mg by mouth at  bedtime.     metFORMIN (GLUCOPHAGE) 1000 MG tablet Take 1 tablet (1,000 mg total) by mouth daily with breakfast. 90 tablet 3   pyridOXINE (VITAMIN B-6) 100 MG tablet Take 100 mg by mouth daily.     rosuvastatin (CRESTOR) 40 MG tablet TAKE 1 TABLET (40 MG TOTAL) BY MOUTH DAILY. (Patient taking differently: Take by mouth every evening.) 90 tablet 3   valsartan (DIOVAN) 40 MG tablet Take 1 tablet (40 mg total) by mouth daily. 90 tablet 3  Zinc Sulfate (ZINC 15 PO) Take by mouth.     No current facility-administered medications for this visit.     ALLERGIES: Sulfonamide derivatives, Xigduo xr [dapagliflozin-metformin hcl er], Hycodan [hydrocodone bit-homatrop mbr], Omeprazole, Cefaclor, Cephalexin, Januvia [sitagliptin], Levofloxacin, Penicillins, Tetracycline, and Victoza [liraglutide]  Family History  Problem Relation Age of Onset   Asthma Mother    COPD Mother    Diabetes Mother    Hypertension Mother    Diabetes Father    Hypertension Father    Breast cancer Maternal Aunt    Breast cancer Paternal Aunt    Colon cancer Neg Hx    Pancreatic cancer Neg Hx    Esophageal cancer Neg Hx    Stomach cancer Neg Hx    Liver disease Neg Hx     Social History   Socioeconomic History   Marital status: Divorced    Spouse name: Not on file   Number of children: Not on file   Years of education: Not on file   Highest education level: Not on file  Occupational History   Not on file  Tobacco Use   Smoking status: Never   Smokeless tobacco: Never  Vaping Use   Vaping Use: Never used  Substance and Sexual Activity   Alcohol use: No    Alcohol/week: 0.0 standard drinks of alcohol   Drug use: No   Sexual activity: Not Currently    Partners: Male    Birth control/protection: Surgical    Comment: TAH/supracervical  Other Topics Concern   Not on file  Social History Narrative   Not on file   Social Determinants of Health   Financial Resource Strain: Not on file  Food Insecurity:  Not on file  Transportation Needs: Not on file  Physical Activity: Not on file  Stress: Not on file  Social Connections: Not on file  Intimate Partner Violence: Not on file    Review of Systems  PHYSICAL EXAMINATION:    LMP 01/27/2000 (Approximate) Comment: supracervical hysterectomy    General appearance: alert, cooperative and appears stated age Head: Normocephalic, without obvious abnormality, atraumatic Neck: no adenopathy, supple, symmetrical, trachea midline and thyroid normal to inspection and palpation Lungs: clear to auscultation bilaterally Breasts: normal appearance, no masses or tenderness, No nipple retraction or dimpling, No nipple discharge or bleeding, No axillary or supraclavicular adenopathy Heart: regular rate and rhythm Abdomen: soft, non-tender, no masses,  no organomegaly Extremities: extremities normal, atraumatic, no cyanosis or edema Skin: Skin color, texture, turgor normal. No rashes or lesions Lymph nodes: Cervical, supraclavicular, and axillary nodes normal. No abnormal inguinal nodes palpated Neurologic: Grossly normal  Pelvic: External genitalia:  no lesions              Urethra:  normal appearing urethra with no masses, tenderness or lesions              Bartholins and Skenes: normal                 Vagina: normal appearing vagina with normal color and discharge, no lesions              Cervix: no lesions                Bimanual Exam:  Uterus:  normal size, contour, position, consistency, mobility, non-tender              Adnexa: no mass, fullness, tenderness              Rectal exam: {yes  [id number]}.  Confirms.              Anus:  normal sphincter tone, no lesions  Chaperone was present for exam:  ***  ASSESSMENT     PLAN     An After Visit Summary was printed and given to the patient.  ______ minutes face to face time of which over 50% was spent in counseling.

## 2021-07-09 ENCOUNTER — Ambulatory Visit: Payer: 59 | Admitting: Obstetrics and Gynecology

## 2021-07-12 ENCOUNTER — Other Ambulatory Visit: Payer: Self-pay

## 2021-07-12 ENCOUNTER — Emergency Department
Admission: RE | Admit: 2021-07-12 | Discharge: 2021-07-12 | Disposition: A | Discharge status: Home / Self Care | Payer: 59 | Source: Ambulatory Visit

## 2021-07-12 VITALS — BP 166/87 | HR 97 | Temp 99.3°F | Resp 17

## 2021-07-12 DIAGNOSIS — J019 Acute sinusitis, unspecified: Secondary | ICD-10-CM | POA: Diagnosis not present

## 2021-07-12 MED ORDER — DOXYCYCLINE HYCLATE 100 MG PO CAPS: 100.0000 mg | ORAL_CAPSULE | Freq: Two times a day (BID) | ORAL | 0 refills | Status: DC

## 2021-07-12 NOTE — Discharge Instructions (Signed)
Take antibiotic as prescribed Recommend nasal spray, Mucinex, and zyrtec Drink plenty of fluids Return if symptoms become worse. Recommend a probiotic

## 2021-07-12 NOTE — ED Provider Notes (Signed)
[facility]    CSN: [id number] Arrival date & time: 07/12/21  1108      History   Chief Complaint Chief Complaint  Patient presents with   Otalgia    APPT 11am; RT   Sinus issues   Sore Throat    HPI Maria Nunez is a 61 y.o. female.   Pt complains of right sided facial pain, sinus pressure, and right ear pain that started about two week ago and has gradually become worse.  She denies fever, chills.  Right ear pain started yesterday.  She is taking claritin and using tylenol prn with temporary relief.  She reports mild intermittent cough.     Past Medical History:  Diagnosis Date   Abnormal ultrasound of ovary 2016   Ultrasound showing bilateral ovaries in contact with one another.  Asymptomatic.   Allergy    multiple med allergies   Anemia 04/29/2020   Arthritis    left knee   Bursitis 01/24/2016   left shoulder   Chest pain 01/29/2021   See 01/29/21 ED note in Epic. Pain significantly improved w/ NTG. CBC, CMP, troponin, BNP unremarkable. EKG showed NSR w/o acute ST changes. 02/19/21 [facility] Stress test - normal.   Chest pain 01/13/2019   Troponin negative x 2, Cxray normal, EKG sinus rhythm w/ borderline T abnormality. Discharged from ER w/ instruction to f/u w/PCP.   Chronic back pain    Chronic kidney disease    Stage 3, follows w/PCP   Complication of anesthesia    Pt states that one time she was told that she was given general anesthesia too fast. She woke up covered in blotches and was given IV Benadryl per pt.   COVID-19 03/05/2021   congestion, fever, all symptoms resolved as of 05/16/21   Diabetes mellitus    Type 2   Fatty liver    Fibroid    hysteretomy in 2002   GERD (gastroesophageal reflux disease)    Patient is taking Pepcid as of 05/16/21.   Heart murmur    very long time ago per pt on 05/16/21   Hidradenitis    years ago per pt on 05/16/21   High cholesterol    follows w/ PCP Dr. Beatrice Lecher   Hypertension     follows with PCP Dr. Beatrice Lecher   Plantar fasciitis, bilateral 09/24/2020   Sleep apnea    wears CPAP   Spondylosis 2023   lumbar, cervicothoracic region with cervicalgia   Wears contact lenses    Wears glasses     Patient Active Problem List   Diagnosis Date Noted   Nocturnal leg cramps 06/13/2021   CKD (chronic kidney disease) stage 3, GFR 30-59 ml/min (HCC) 06/26/2020   Morbid obesity ([place]) 05/30/2020   Screening for malignant neoplasm of colon 05/30/2020   Gastroesophageal reflux disease without esophagitis 05/16/2020   Atypical chest pain 05/16/2020   Normocytic anemia 05/08/2020   Fatty liver 05/01/2020   Decreased appetite 04/22/2020   Non-intractable vomiting 04/22/2020   OSA (obstructive sleep apnea) 02/08/2020   Plantar fasciitis, bilateral 09/19/2019   Pain in right hand 02/21/2018   Bursitis of left shoulder 01/24/2016   GAD (generalized anxiety disorder) 04/02/2015   Localized primary osteoarthritis of carpometacarpal joint of right thumb 03/15/2015   Carpal tunnel syndrome, bilateral 04/05/2013   LGSIL (low grade squamous intraepithelial dysplasia) 06/16/2012   Type 2 diabetes mellitus with other specified complication ([place]) 32/99/2426   PANIC ATTACK 09/07/2008  RESTLESS LEG SYNDROME 04/02/2008   POLYARTHRITIS 04/02/2008   Primary osteoarthritis of left knee 01/02/2008   HOT FLASHES 08/04/2006   Hyperlipidemia associated with type 2 diabetes mellitus ([place]) 11/03/2005   HYPERTENSION, BENIGN SYSTEMIC 11/03/2005    Past Surgical History:  Procedure Laterality Date   BREAST EXCISIONAL BIOPSY Right 2020   BREAST SURGERY     cysts removed right breast, around 20 years ago per pt on 05/16/21   BREAST SURGERY  08/2018   benign mass   CARPAL TUNNEL RELEASE Right 01/08/2017   Procedure: CARPAL TUNNEL RELEASE, POSSIBLE RIGHT THUMB CARPOMETACARPAL INJECTION;  Surgeon: Roseanne Kaufman, MD;  Location: [facility];  Service: Orthopedics;   Laterality: Right;  [place] N/A 05/19/2021   Procedure: COLPOSCOPY;  Surgeon: Nunzio Cobbs, MD;  Location: [facility];  Service: Gynecology;  Laterality: N/A;   ESOPHAGOGASTRODUODENOSCOPY  07/19/2020   in Epic   INGUINAL HIDRADENITIS EXCISION     many years ago per pt on 05/16/21   LEEP  05/04/2013   HPV change   LEEP N/A 05/19/2021   Procedure: LOOP ELECTROSURGICAL EXCISION PROCEDURE (LEEP), ENDOCERVICAL CURETTAGE;  Surgeon: Nunzio Cobbs, MD;  Location: [facility];  Service: Gynecology;  Laterality: N/A;   SUPRACERVICAL ABDOMINAL HYSTERECTOMY  2002   Done for bleeding, fibroids in [place]    OB History     Gravida  3   Para  2   Term  2   Preterm      AB  1   Living  2      SAB  1   IAB      Ectopic      Multiple      Live Births               Home Medications    Prior to Admission medications   Medication Sig Start Date End Date Taking? Authorizing Provider  doxycycline (VIBRAMYCIN) 100 MG capsule Take 1 capsule (100 mg total) by mouth 2 (two) times daily. 07/12/21  Yes Ward, Lenise Arena, PA-C  AMBULATORY NON FORMULARY MEDICATION Set CPAP to 10 cm water pressure. Need download in 2 weeks after change of setting. 09/10/20   Hali Marry, MD  Ascorbic Acid (VITAMIN C) 1000 MG tablet Take 1 tablet by mouth daily. 04/10/18   [provider]  Blood Glucose Monitoring Suppl (FREESTYLE LITE) DEVI  03/13/19   [provider]  Cyanocobalamin (B-12) 500 MCG TABS Take 1 tablet by mouth daily.    [provider]  estradiol (ESTRACE) 0.1 MG/GM vaginal cream Insert 1/2 gram vaginally every other night. 06/18/21   Nunzio Cobbs, MD  famotidine (PEPCID) 40 MG tablet Take 1 tablet by mouth two times daily. Patient taking differently: Take 40 mg by mouth 2 (two) times daily. Usually just takes once daily. 07/22/20   Hali Marry, MD  FOLIC ACID PO Take 496 mcg by mouth daily.    [provider]  glucose blood (FREESTYLE LITE) test strip USE TO TEST BLOOD SUGARS TWICE DAILY 05/09/20 08/04/21  Hali Marry, MD  Lancets (FREESTYLE) lancets For use when testing blood sugars daily. DX:E11.69 03/13/19   Hali Marry, MD  magnesium oxide (MAG-OX) 400 (240 Mg) MG tablet Take 2 tablets (800 mg total) by mouth at bedtime. 06/13/21   Silverio Decamp, MD  melatonin 1 MG TABS tablet Take 1 mg by mouth at bedtime.    [provider]  metFORMIN (GLUCOPHAGE) 1000 MG tablet Take 1 tablet (1,000 mg total) by mouth daily with breakfast. 08/01/20 08/04/21  Hali Marry, MD  pyridOXINE (VITAMIN B-6) 100 MG tablet Take 100 mg by mouth daily.    [provider]  rosuvastatin (CRESTOR) 40 MG tablet TAKE 1 TABLET (40 MG TOTAL) BY MOUTH DAILY. Patient taking differently: Take by mouth every evening. 08/13/20 09/02/21  Hali Marry, MD  valsartan (DIOVAN) 40 MG tablet Take 1 tablet (40 mg total) by mouth daily. 04/25/21 03/25/22  Hali Marry, MD  Zinc Sulfate (ZINC 15 PO) Take by mouth.    [provider]    Family History Family History  Problem Relation Age of Onset   Asthma Mother    COPD Mother    Diabetes Mother    Hypertension Mother    Diabetes Father    Hypertension Father    Breast cancer Maternal Aunt    Breast cancer Paternal Aunt    Colon cancer Neg Hx    Pancreatic cancer Neg Hx    Esophageal cancer Neg Hx    Stomach cancer Neg Hx    Liver disease Neg Hx     Social History Social History   Tobacco Use   Smoking status: Never   Smokeless tobacco: Never  Vaping Use   Vaping Use: Never used  Substance Use Topics   Alcohol use: No    Alcohol/week: 0.0 standard drinks of alcohol   Drug use: No     Allergies   Sulfonamide derivatives, Xigduo xr [dapagliflozin-metformin hcl er], Hycodan [hydrocodone bit-homatrop mbr], Omeprazole,  Cefaclor, Cephalexin, Januvia [sitagliptin], Levofloxacin, Penicillins, Tetracycline, and Victoza [liraglutide]   Review of Systems Review of Systems  Constitutional:  Negative for chills and fever.  HENT:  Positive for ear pain, sinus pressure and sinus pain. Negative for sore throat.   Eyes:  Negative for pain and visual disturbance.  Respiratory:  Positive for cough. Negative for shortness of breath.   Cardiovascular:  Negative for chest pain and palpitations.  Gastrointestinal:  Negative for abdominal pain and vomiting.  Genitourinary:  Negative for dysuria and hematuria.  Musculoskeletal:  Negative for arthralgias and back pain.  Skin:  Negative for color change and rash.  Neurological:  Negative for seizures and syncope.  All other systems reviewed and are negative.    Physical Exam Triage Vital Signs ED Triage Vitals  Enc Vitals Group     BP 07/12/21 1124 (!) 166/87     Pulse Rate 07/12/21 1124 97     Resp 07/12/21 1124 17     Temp 07/12/21 1124 99.3 F (37.4 C)     Temp Source 07/12/21 1124 Oral     SpO2 07/12/21 1124 96 %     Weight --      Height --      Head Circumference --      Peak Flow --      Pain Score 07/12/21 1125 5     Pain Loc --      Pain Edu? --      Excl. in [place]? --    No data found.  Updated Vital Signs BP (!) 166/87 (BP Location: Right Arm)   Pulse 97   Temp 99.3 F (37.4 C) (Oral)   Resp 17   LMP 01/27/2000 (Approximate) Comment: supracervical hysterectomy  SpO2 96%   Visual Acuity Right Eye  Distance:   Left Eye Distance:   Bilateral Distance:    Right Eye Near:   Left Eye Near:    Bilateral Near:     Physical Exam Vitals and nursing note reviewed.  Constitutional:      General: She is not in acute distress.    Appearance: She is well-developed.  HENT:     Head: Normocephalic and atraumatic.     Comments: Right maxillary sinus tenderness    Right Ear: Tympanic membrane normal.     Left Ear: Tympanic membrane normal.  Eyes:      Conjunctiva/sclera: Conjunctivae normal.  Cardiovascular:     Rate and Rhythm: Normal rate and regular rhythm.     Heart sounds: No murmur heard. Pulmonary:     Effort: Pulmonary effort is normal. No respiratory distress.     Breath sounds: Normal breath sounds.  Abdominal:     Palpations: Abdomen is soft.     Tenderness: There is no abdominal tenderness.  Musculoskeletal:        General: No swelling.     Cervical back: Neck supple.  Skin:    General: Skin is warm and dry.     Capillary Refill: Capillary refill takes less than 2 seconds.  Neurological:     Mental Status: She is alert.  Psychiatric:        Mood and Affect: Mood normal.      UC Treatments / Results  Labs (all labs ordered are listed, but only abnormal results are displayed) Labs Reviewed - No data to display  EKG   Radiology No results found.  Procedures Procedures (including critical care time)  Medications Ordered in UC Medications - No data to display  Initial Impression / Assessment and Plan / UC Course  I have reviewed the triage vital signs and the nursing notes.  Pertinent labs & imaging results that were available during my care of the patient were reviewed by me and considered in my medical decision making (see chart for details).     Acute sinusitis given facial pain, right ear pain.  Right TM normal.  Antibiotic prescribed.  She has taken doxycycline in the past with no problems.  Advised daily probiotic given GI upset with tetracycline.  Supportive care discussed. Return precautions discussed.  Final Clinical Impressions(s) / UC Diagnoses   Final diagnoses:  Acute sinusitis, recurrence not specified, unspecified location     Discharge Instructions      Take antibiotic as prescribed Recommend nasal spray, Mucinex, and zyrtec Drink plenty of fluids Return if symptoms become worse. Recommend a probiotic    ED Prescriptions     Medication Sig Dispense Auth. Provider    doxycycline (VIBRAMYCIN) 100 MG capsule Take 1 capsule (100 mg total) by mouth 2 (two) times daily. 20 capsule Ward, Lenise Arena, PA-C      PDMP not reviewed this encounter.   Ward, Lenise Arena, PA-C 07/12/21 1152

## 2021-07-12 NOTE — ED Triage Notes (Signed)
Pt c/o sinus congestion and sore throat x 10 days. RT ear pain since yesterday. Denies fever. Taking Claritin and tylenol prn.

## 2021-07-15 NOTE — Progress Notes (Signed)
GYNECOLOGY  VISIT   HPI: 61 y.o.   Divorced  Caucasian  female   [phone] with Patient's last menstrual period was 01/27/2000 (approximate).   here for 8 weeks status post   LOOP ELECTROSURGICAL EXCISION PROCEDURE (LEEP), ENDOCERVICAL CURETTAGE (Vagina )  COLPOSCOPY (Vagina ).  Final pathology showing LGSI  Patient has had vaginal inflammation, atrophy and agglutination during her post op recovery and is seen for regular visits due to this.   Had a sinus infection last week.  On Doxycycline and developed GI upset.  Taking it once daily.  Feeling better overall.   Using vaginal estrogen cream.   GYNECOLOGIC HISTORY: Patient's last menstrual period was 01/27/2000 (approximate). Contraception:  SUPRACX HYST Menopausal hormone therapy:  NONE Last mammogram:    10/02/20-neg birads 1 Last pap smear:  04/11/21-unsatisfactory, 08/19/20-WNL, HR HPV+, 07/11/18-WNL, HPV- neg.         OB History     Gravida  3   Para  2   Term  2   Preterm      AB  1   Living  2      SAB  1   IAB      Ectopic      Multiple      Live Births                 Patient Active Problem List   Diagnosis Date Noted   Nocturnal leg cramps 06/13/2021   CKD (chronic kidney disease) stage 3, GFR 30-59 ml/min (HCC) 06/26/2020   Morbid obesity ([place]) 05/30/2020   Screening for malignant neoplasm of colon 05/30/2020   Gastroesophageal reflux disease without esophagitis 05/16/2020   Atypical chest pain 05/16/2020   Normocytic anemia 05/08/2020   Fatty liver 05/01/2020   Decreased appetite 04/22/2020   Non-intractable vomiting 04/22/2020   OSA (obstructive sleep apnea) 02/08/2020   Plantar fasciitis, bilateral 09/19/2019   Pain in right hand 02/21/2018   Bursitis of left shoulder 01/24/2016   GAD (generalized anxiety disorder) 04/02/2015   Localized primary osteoarthritis of carpometacarpal joint of right thumb 03/15/2015   Carpal tunnel syndrome, bilateral 04/05/2013   LGSIL (low grade squamous  intraepithelial dysplasia) 06/16/2012   Type 2 diabetes mellitus with other specified complication ([place]) 38/75/6433   PANIC ATTACK 09/07/2008   RESTLESS LEG SYNDROME 04/02/2008   POLYARTHRITIS 04/02/2008   Primary osteoarthritis of left knee 01/02/2008   HOT FLASHES 08/04/2006   Hyperlipidemia associated with type 2 diabetes mellitus ([place]) 11/03/2005   HYPERTENSION, BENIGN SYSTEMIC 11/03/2005    Past Medical History:  Diagnosis Date   Abnormal ultrasound of ovary 2016   Ultrasound showing bilateral ovaries in contact with one another.  Asymptomatic.   Allergy    multiple med allergies   Anemia 04/29/2020   Arthritis    left knee   Bursitis 01/24/2016   left shoulder   Chest pain 01/29/2021   See 01/29/21 ED note in Epic. Pain significantly improved w/ NTG. CBC, CMP, troponin, BNP unremarkable. EKG showed NSR w/o acute ST changes. 02/19/21 [facility] Stress test - normal.   Chest pain 01/13/2019   Troponin negative x 2, Cxray normal, EKG sinus rhythm w/ borderline T abnormality. Discharged from ER w/ instruction to f/u w/PCP.   Chronic back pain    Chronic kidney disease    Stage 3, follows w/PCP   Complication of anesthesia    Pt states that one time she was told that she was given general anesthesia too fast. She  woke up covered in blotches and was given IV Benadryl per pt.   COVID-19 03/05/2021   congestion, fever, all symptoms resolved as of 05/16/21   Diabetes mellitus    Type 2   Fatty liver    Fibroid    hysteretomy in 2002   GERD (gastroesophageal reflux disease)    Patient is taking Pepcid as of 05/16/21.   Heart murmur    very long time ago per pt on 05/16/21   Hidradenitis    years ago per pt on 05/16/21   High cholesterol    follows w/ PCP Dr. Beatrice Lecher   Hypertension    follows with PCP Dr. Beatrice Lecher   Plantar fasciitis, bilateral 09/24/2020   Sleep apnea    wears CPAP   Spondylosis 2023   lumbar, cervicothoracic region with  cervicalgia   Wears contact lenses    Wears glasses     Past Surgical History:  Procedure Laterality Date   BREAST EXCISIONAL BIOPSY Right 2020   BREAST SURGERY     cysts removed right breast, around 20 years ago per pt on 05/16/21   BREAST SURGERY  08/2018   benign mass   CARPAL TUNNEL RELEASE Right 01/08/2017   Procedure: CARPAL TUNNEL RELEASE, POSSIBLE RIGHT THUMB CARPOMETACARPAL INJECTION;  Surgeon: Roseanne Kaufman, MD;  Location: [facility];  Service: Orthopedics;  Laterality: Right;  [place] N/A 05/19/2021   Procedure: COLPOSCOPY;  Surgeon: Nunzio Cobbs, MD;  Location: [facility];  Service: Gynecology;  Laterality: N/A;   ESOPHAGOGASTRODUODENOSCOPY  07/19/2020   in Epic   INGUINAL HIDRADENITIS EXCISION     many years ago per pt on 05/16/21   LEEP  05/04/2013   HPV change   LEEP N/A 05/19/2021   Procedure: LOOP ELECTROSURGICAL EXCISION PROCEDURE (LEEP), ENDOCERVICAL CURETTAGE;  Surgeon: Nunzio Cobbs, MD;  Location: [facility];  Service: Gynecology;  Laterality: N/A;   SUPRACERVICAL ABDOMINAL HYSTERECTOMY  2002   Done for bleeding, fibroids in [place]    Current Outpatient Medications  Medication Sig Dispense Refill   AMBULATORY NON FORMULARY MEDICATION Set CPAP to 10 cm water pressure. Need download in 2 weeks after change of setting. 1 each 0   Ascorbic Acid (VITAMIN C) 1000 MG tablet Take 1 tablet by mouth daily.     Blood Glucose Monitoring Suppl (FREESTYLE LITE) DEVI      Cyanocobalamin (B-12) 500 MCG TABS Take 1 tablet by mouth daily.     doxycycline (VIBRAMYCIN) 100 MG capsule Take 1 capsule (100 mg total) by mouth 2 (two) times daily. 20 capsule 0   estradiol (ESTRACE) 0.1 MG/GM vaginal cream Insert 1/2 gram vaginally every other night. 42.5 g 1   famotidine (PEPCID) 40 MG tablet Take 1 tablet by mouth two times daily. (Patient taking differently: Take 40  mg by mouth 2 (two) times daily. Usually just takes once daily.) 865 tablet 1   FOLIC ACID PO Take 784 mcg by mouth daily.     glucose blood (FREESTYLE LITE) test strip USE TO TEST BLOOD SUGARS TWICE DAILY 400 strip 12   Lancets (FREESTYLE) lancets For use when testing blood sugars daily. DX:E11.69 200 each 12   magnesium oxide (MAG-OX) 400 (240 Mg) MG tablet Take 2 tablets (800 mg total) by mouth at bedtime. 120 tablet 3   melatonin 1 MG TABS tablet Take 1 mg  by mouth at bedtime.     metFORMIN (GLUCOPHAGE) 1000 MG tablet Take 1 tablet (1,000 mg total) by mouth daily with breakfast. 90 tablet 3   pyridOXINE (VITAMIN B-6) 100 MG tablet Take 100 mg by mouth daily.     rosuvastatin (CRESTOR) 40 MG tablet TAKE 1 TABLET (40 MG TOTAL) BY MOUTH DAILY. (Patient taking differently: Take by mouth every evening.) 90 tablet 3   valsartan (DIOVAN) 40 MG tablet Take 1 tablet (40 mg total) by mouth daily. 90 tablet 3   Zinc Sulfate (ZINC 15 PO) Take by mouth.     No current facility-administered medications for this visit.     ALLERGIES: Sulfonamide derivatives, Xigduo xr [dapagliflozin-metformin hcl er], Hycodan [hydrocodone bit-homatrop mbr], Omeprazole, Cefaclor, Cephalexin, Januvia [sitagliptin], Levofloxacin, Penicillins, Tetracycline, and Victoza [liraglutide]  Family History  Problem Relation Age of Onset   Asthma Mother    COPD Mother    Diabetes Mother    Hypertension Mother    Diabetes Father    Hypertension Father    Breast cancer Maternal Aunt    Breast cancer Paternal Aunt    Colon cancer Neg Hx    Pancreatic cancer Neg Hx    Esophageal cancer Neg Hx    Stomach cancer Neg Hx    Liver disease Neg Hx     Social History   Socioeconomic History   Marital status: Divorced    Spouse name: Not on file   Number of children: Not on file   Years of education: Not on file   Highest education level: Not on file  Occupational History   Not on file  Tobacco Use   Smoking status: Never    Smokeless tobacco: Never  Vaping Use   Vaping Use: Never used  Substance and Sexual Activity   Alcohol use: No    Alcohol/week: 0.0 standard drinks of alcohol   Drug use: No   Sexual activity: Not Currently    Partners: Male    Birth control/protection: Surgical    Comment: TAH/supracervical  Other Topics Concern   Not on file  Social History Narrative   Not on file   Social Determinants of Health   Financial Resource Strain: Not on file  Food Insecurity: Not on file  Transportation Needs: Not on file  Physical Activity: Not on file  Stress: Not on file  Social Connections: Not on file  Intimate Partner Violence: Not on file    Review of Systems  PHYSICAL EXAMINATION:    BP 120/80   LMP 01/27/2000 (Approximate) Comment: supracervical hysterectomy    General appearance: alert, cooperative and appears stated age  Pelvic: External genitalia:  no lesions              Urethra:  normal appearing urethra with no masses, tenderness or lesions              Bartholins and Skenes: normal                 Vagina: normal appearing vagina with normal color and discharge, no lesions              Cervix: flush with vaginal apex.   No lesions noted.                  Bimanual Exam:  Uterus:  absent.  Minor agglutination appreciated with bimanual exam.  Small amount of blood noted on glove after exam was completed.  Adnexa: no mass, fullness, tenderness            Chaperone was present for exam:  Estill Bamberg, CMA  ASSESSMENT  Status post LEEP.  Vaginal atrophy.  Vaginal adhesion post procedure. Healing improved overall.   PLAN  Finish doxcycline.    Continue estrogen.  Fu in 2 weeks.   An After Visit Summary was printed and given to the patient.

## 2021-07-16 ENCOUNTER — Ambulatory Visit (INDEPENDENT_AMBULATORY_CARE_PROVIDER_SITE_OTHER): Payer: 59 | Admitting: Obstetrics and Gynecology

## 2021-07-16 VITALS — BP 120/80

## 2021-07-16 DIAGNOSIS — N992 Postprocedural adhesions of vagina: Secondary | ICD-10-CM | POA: Diagnosis not present

## 2021-07-16 DIAGNOSIS — N952 Postmenopausal atrophic vaginitis: Secondary | ICD-10-CM | POA: Diagnosis not present

## 2021-07-16 DIAGNOSIS — Z9889 Other specified postprocedural states: Secondary | ICD-10-CM

## 2021-07-17 ENCOUNTER — Encounter: Payer: Self-pay | Admitting: Obstetrics and Gynecology

## 2021-07-28 ENCOUNTER — Other Ambulatory Visit (HOSPITAL_BASED_OUTPATIENT_CLINIC_OR_DEPARTMENT_OTHER): Payer: Self-pay

## 2021-07-28 ENCOUNTER — Encounter: Payer: Self-pay | Admitting: Family Medicine

## 2021-07-28 ENCOUNTER — Ambulatory Visit (INDEPENDENT_AMBULATORY_CARE_PROVIDER_SITE_OTHER): Payer: 59 | Admitting: Family Medicine

## 2021-07-28 VITALS — BP 138/60 | HR 87 | Resp 16 | Ht 60.0 in | Wt 162.0 lb

## 2021-07-28 DIAGNOSIS — J3489 Other specified disorders of nose and nasal sinuses: Secondary | ICD-10-CM | POA: Diagnosis not present

## 2021-07-28 DIAGNOSIS — E1169 Type 2 diabetes mellitus with other specified complication: Secondary | ICD-10-CM | POA: Diagnosis not present

## 2021-07-28 DIAGNOSIS — I10 Essential (primary) hypertension: Secondary | ICD-10-CM

## 2021-07-28 DIAGNOSIS — J029 Acute pharyngitis, unspecified: Secondary | ICD-10-CM | POA: Diagnosis not present

## 2021-07-28 LAB — POCT GLYCOSYLATED HEMOGLOBIN (HGB A1C): Hemoglobin A1C: 7 % — AB (ref 4.0–5.6)

## 2021-07-28 MED ORDER — ATROVENT HFA 17 MCG/ACT IN AERS
2.0000 | INHALATION_SPRAY | Freq: Two times a day (BID) | RESPIRATORY_TRACT | 1 refills | Status: DC
  Filled 2021-07-28: qty 12.9, 30d supply, fill #0

## 2021-07-28 NOTE — Assessment & Plan Note (Signed)
Blood pressure little borderline today it looked better at last office visit but we will continue to monitor carefully.

## 2021-07-28 NOTE — Progress Notes (Signed)
Established Patient Office Visit  Subjective   Patient ID: Maria Nunez, female    DOB: 1960-10-14  Age: 61 y.o. MRN: [id number]  Chief Complaint  Patient presents with   Diabetes    Follow up. Patient will call to schedule DM eye exam    Hypertension    Follow up    Sore Throat    Patient states she has a sore throat and right ear pain for 1 month. Patient was seen at urgent care and prescribed doxy 1 month ago.     HPI  Hypertension- Pt denies chest pain, SOB, dizziness, or heart palpitations.  Taking meds as directed w/o problems.  Denies medication side effects.    Diabetes - no hypoglycemic events. No wounds or sores that are not healing well. No increased thirst or urination. Checking glucose at home. Taking medications as prescribed without any side effects.  She has not been walking on the treadmill consistently lately.  She has had a lot of stressors going on in her home life.  Persistent sore throat and right ear pain for greater than a month.  Was treated with doxycycline but does not feel like its completely better.  She has had a lot of chronic sinus drainage.  Was previously having a lot of sinus pressure but that seems a little bit better.  She had held using her CPAP for a while because of it.  But she has restarted it.    ROS    Objective:     BP 138/60   Pulse 87   Resp 16   Ht 5' (1.524 m)   Wt 162 lb (73.5 kg)   LMP 01/27/2000 (Approximate) Comment: supracervical hysterectomy  SpO2 94%   BMI 31.64 kg/m    Physical Exam Vitals and nursing note reviewed.  Constitutional:      Appearance: She is well-developed.  HENT:     Head: Normocephalic and atraumatic.     Right Ear: Tympanic membrane and ear canal normal.     Left Ear: Tympanic membrane and ear canal normal.     Nose: No congestion.     Mouth/Throat:     Pharynx: Oropharynx is clear.  Cardiovascular:     Rate and Rhythm: Normal rate and regular rhythm.     Heart sounds: Normal  heart sounds.  Pulmonary:     Effort: Pulmonary effort is normal.     Breath sounds: Normal breath sounds.  Skin:    General: Skin is warm and dry.  Neurological:     Mental Status: She is alert and oriented to person, place, and time.  Psychiatric:        Behavior: Behavior normal.      Results for orders placed or performed in visit on 07/28/21  POCT HgB A1C  Result Value Ref Range   Hemoglobin A1C 7.0 (A) 4.0 - 5.6 %   HbA1c POC (<> result, manual entry)     HbA1c, POC (prediabetic range)     HbA1c, POC (controlled diabetic range)        The ASCVD Risk score (Arnett DK, et al., 2019) failed to calculate for the following reasons:   The valid total cholesterol range is 130 to 320 mg/dL    Assessment & Plan:   Problem List Items Addressed This Visit       Cardiovascular and Mediastinum   HYPERTENSION, BENIGN SYSTEMIC    Blood pressure little borderline today it looked better at last office visit but we  will continue to monitor carefully.        Endocrine   Type 2 diabetes mellitus with other specified complication ([place]) - Primary    1C up to 7.0 we discussed options including continuing to work on diet and exercise and getting back on track with the treadmill to get it back down under 7 which is where she was previously.  Also encouraged her to consider a trial of one of the GLP-1 medications that are given weekly.      Relevant Orders   POCT HgB A1C (Completed)   Other Visit Diagnoses     Sore throat       Relevant Medications   ipratropium (ATROVENT HFA) 17 MCG/ACT inhaler   Sinus drainage       Relevant Medications   ipratropium (ATROVENT HFA) 17 MCG/ACT inhaler      Persistent sore throat with ear pain-ear exam looks good today I think it is probably from just chronic sinus drainage.  She is currently using a nasal steroid spray but it does not seem to be helping we discussed either a trial of ipratropium or a nasal antihistamine.  We will start with  ipratropium and try that for at least a week and a half if that is not helpful then we can try Astelin or Astepro.  Return in about 4 months (around 11/26/2021) for Diabetes follow-up.    Beatrice Lecher, MD

## 2021-07-28 NOTE — Assessment & Plan Note (Signed)
1C up to 7.0 we discussed options including continuing to work on diet and exercise and getting back on track with the treadmill to get it back down under 7 which is where she was previously.  Also encouraged her to consider a trial of one of the GLP-1 medications that are given weekly.

## 2021-07-28 NOTE — Patient Instructions (Signed)
We can consider Trulicity, Ozempic or Mounjaro

## 2021-07-30 ENCOUNTER — Ambulatory Visit (INDEPENDENT_AMBULATORY_CARE_PROVIDER_SITE_OTHER): Payer: 59 | Admitting: Obstetrics and Gynecology

## 2021-07-30 ENCOUNTER — Other Ambulatory Visit (HOSPITAL_BASED_OUTPATIENT_CLINIC_OR_DEPARTMENT_OTHER): Payer: Self-pay

## 2021-07-30 ENCOUNTER — Encounter: Payer: Self-pay | Admitting: Obstetrics and Gynecology

## 2021-07-30 VITALS — BP 138/76 | Ht 60.0 in | Wt 163.0 lb

## 2021-07-30 DIAGNOSIS — N952 Postmenopausal atrophic vaginitis: Secondary | ICD-10-CM | POA: Diagnosis not present

## 2021-07-30 DIAGNOSIS — Z9889 Other specified postprocedural states: Secondary | ICD-10-CM | POA: Diagnosis not present

## 2021-07-30 NOTE — Progress Notes (Signed)
GYNECOLOGY  VISIT   HPI: 61 y.o.   Divorced  Caucasian  female   [phone] with Patient's last menstrual period was 01/27/2000 (approximate).   here for 10 weeks status post LOOP ELECTROSURGICAL EXCISION PROCEDURE (LEEP), ENDOCERVICAL CURETTAGE (Vagina )  COLPOSCOPY (Vagina )  Using vaginal estrogen.   No bleeding or pain.    GYNECOLOGIC HISTORY: Patient's last menstrual period was 01/27/2000 (approximate). Contraception:  SUPRACX HYST Menopausal hormone therapy:  none Last mammogram:  10/02/20-neg birads 1 Last pap smear:  04/11/21-unsatisfactory, 08/19/20-WNL, HR HPV+, 07/11/18-WNL, HPV- neg.         OB History     Gravida  3   Para  2   Term  2   Preterm      AB  1   Living  2      SAB  1   IAB      Ectopic      Multiple      Live Births                 Patient Active Problem List   Diagnosis Date Noted   Nocturnal leg cramps 06/13/2021   CKD (chronic kidney disease) stage 3, GFR 30-59 ml/min (HCC) 06/26/2020   Morbid obesity ([place]) 05/30/2020   Screening for malignant neoplasm of colon 05/30/2020   Gastroesophageal reflux disease without esophagitis 05/16/2020   Atypical chest pain 05/16/2020   Normocytic anemia 05/08/2020   Fatty liver 05/01/2020   Decreased appetite 04/22/2020   Non-intractable vomiting 04/22/2020   OSA (obstructive sleep apnea) 02/08/2020   Plantar fasciitis, bilateral 09/19/2019   Pain in right hand 02/21/2018   Bursitis of left shoulder 01/24/2016   GAD (generalized anxiety disorder) 04/02/2015   Localized primary osteoarthritis of carpometacarpal joint of right thumb 03/15/2015   Carpal tunnel syndrome, bilateral 04/05/2013   LGSIL (low grade squamous intraepithelial dysplasia) 06/16/2012   Type 2 diabetes mellitus with other specified complication ([place]) 45/40/9811   PANIC ATTACK 09/07/2008   RESTLESS LEG SYNDROME 04/02/2008   POLYARTHRITIS 04/02/2008   Primary osteoarthritis of left knee 01/02/2008   HOT FLASHES  08/04/2006   Hyperlipidemia associated with type 2 diabetes mellitus ([place]) 11/03/2005   HYPERTENSION, BENIGN SYSTEMIC 11/03/2005    Past Medical History:  Diagnosis Date   Abnormal ultrasound of ovary 2016   Ultrasound showing bilateral ovaries in contact with one another.  Asymptomatic.   Allergy    multiple med allergies   Anemia 04/29/2020   Arthritis    left knee   Bursitis 01/24/2016   left shoulder   Chest pain 01/29/2021   See 01/29/21 ED note in Epic. Pain significantly improved w/ NTG. CBC, CMP, troponin, BNP unremarkable. EKG showed NSR w/o acute ST changes. 02/19/21 [facility] Stress test - normal.   Chest pain 01/13/2019   Troponin negative x 2, Cxray normal, EKG sinus rhythm w/ borderline T abnormality. Discharged from ER w/ instruction to f/u w/PCP.   Chronic back pain    Chronic kidney disease    Stage 3, follows w/PCP   Complication of anesthesia    Pt states that one time she was told that she was given general anesthesia too fast. She woke up covered in blotches and was given IV Benadryl per pt.   COVID-19 03/05/2021   congestion, fever, all symptoms resolved as of 05/16/21   Diabetes mellitus    Type 2   Fatty liver    Fibroid    hysteretomy in 2002  GERD (gastroesophageal reflux disease)    Patient is taking Pepcid as of 05/16/21.   Heart murmur    very long time ago per pt on 05/16/21   Hidradenitis    years ago per pt on 05/16/21   High cholesterol    follows w/ PCP Dr. Beatrice Lecher   Hypertension    follows with PCP Dr. Beatrice Lecher   Plantar fasciitis, bilateral 09/24/2020   Sleep apnea    wears CPAP   Spondylosis 2023   lumbar, cervicothoracic region with cervicalgia   Wears contact lenses    Wears glasses     Past Surgical History:  Procedure Laterality Date   BREAST EXCISIONAL BIOPSY Right 2020   BREAST SURGERY     cysts removed right breast, around 20 years ago per pt on 05/16/21   BREAST SURGERY  08/2018   benign mass    CARPAL TUNNEL RELEASE Right 01/08/2017   Procedure: CARPAL TUNNEL RELEASE, POSSIBLE RIGHT THUMB CARPOMETACARPAL INJECTION;  Surgeon: Roseanne Kaufman, MD;  Location: [facility];  Service: Orthopedics;  Laterality: Right;  [place] N/A 05/19/2021   Procedure: COLPOSCOPY;  Surgeon: Nunzio Cobbs, MD;  Location: [facility];  Service: Gynecology;  Laterality: N/A;   ESOPHAGOGASTRODUODENOSCOPY  07/19/2020   in Epic   INGUINAL HIDRADENITIS EXCISION     many years ago per pt on 05/16/21   LEEP  05/04/2013   HPV change   LEEP N/A 05/19/2021   Procedure: LOOP ELECTROSURGICAL EXCISION PROCEDURE (LEEP), ENDOCERVICAL CURETTAGE;  Surgeon: Nunzio Cobbs, MD;  Location: [facility];  Service: Gynecology;  Laterality: N/A;   SUPRACERVICAL ABDOMINAL HYSTERECTOMY  2002   Done for bleeding, fibroids in [place]    Current Outpatient Medications  Medication Sig Dispense Refill   AMBULATORY NON FORMULARY MEDICATION Set CPAP to 10 cm water pressure. Need download in 2 weeks after change of setting. 1 each 0   Ascorbic Acid (VITAMIN C) 1000 MG tablet Take 1 tablet by mouth daily.     Blood Glucose Monitoring Suppl (FREESTYLE LITE) DEVI      Cyanocobalamin (B-12) 500 MCG TABS Take 1 tablet by mouth daily.     estradiol (ESTRACE) 0.1 MG/GM vaginal cream Insert 1/2 gram vaginally every other night. 42.5 g 1   famotidine (PEPCID) 40 MG tablet Take 1 tablet by mouth two times daily. (Patient taking differently: Take 40 mg by mouth 2 (two) times daily. Usually just takes once daily.) 024 tablet 1   FOLIC ACID PO Take 097 mcg by mouth daily.     glucose blood (FREESTYLE LITE) test strip USE TO TEST BLOOD SUGARS TWICE DAILY 400 strip 12   ipratropium (ATROVENT HFA) 17 MCG/ACT inhaler Inhale 2 puffs into the lungs in the morning and at bedtime. 12.9 g 1   Lancets (FREESTYLE) lancets For use when testing blood  sugars daily. DX:E11.69 200 each 12   magnesium oxide (MAG-OX) 400 (240 Mg) MG tablet Take 2 tablets (800 mg total) by mouth at bedtime. 120 tablet 3   melatonin 1 MG TABS tablet Take 1 mg by mouth at bedtime.     metFORMIN (GLUCOPHAGE) 1000 MG tablet Take 1 tablet (1,000 mg total) by mouth daily with breakfast. 90 tablet 3   pyridOXINE (VITAMIN B-6) 100 MG tablet Take 100 mg by mouth daily.     rosuvastatin (CRESTOR) 40 MG tablet  TAKE 1 TABLET (40 MG TOTAL) BY MOUTH DAILY. (Patient taking differently: Take by mouth every evening.) 90 tablet 3   valsartan (DIOVAN) 40 MG tablet Take 1 tablet (40 mg total) by mouth daily. 90 tablet 3   Zinc Sulfate (ZINC 15 PO) Take by mouth.     No current facility-administered medications for this visit.     ALLERGIES: Sulfonamide derivatives, Xigduo xr [dapagliflozin-metformin hcl er], Hycodan [hydrocodone bit-homatrop mbr], Doxycycline, Omeprazole, Cefaclor, Cephalexin, Januvia [sitagliptin], Levofloxacin, Penicillins, Tetracycline, and Victoza [liraglutide]  Family History  Problem Relation Age of Onset   Asthma Mother    COPD Mother    Diabetes Mother    Hypertension Mother    Diabetes Father    Hypertension Father    Breast cancer Maternal Aunt    Breast cancer Paternal Aunt    Colon cancer Neg Hx    Pancreatic cancer Neg Hx    Esophageal cancer Neg Hx    Stomach cancer Neg Hx    Liver disease Neg Hx     Social History   Socioeconomic History   Marital status: Divorced    Spouse name: Not on file   Number of children: Not on file   Years of education: Not on file   Highest education level: Not on file  Occupational History   Not on file  Tobacco Use   Smoking status: Never   Smokeless tobacco: Never  Vaping Use   Vaping Use: Never used  Substance and Sexual Activity   Alcohol use: No    Alcohol/week: 0.0 standard drinks of alcohol   Drug use: No   Sexual activity: Not Currently    Partners: Male    Birth control/protection:  Surgical    Comment: TAH/supracervical  Other Topics Concern   Not on file  Social History Narrative   Not on file   Social Determinants of Health   Financial Resource Strain: Not on file  Food Insecurity: Not on file  Transportation Needs: Not on file  Physical Activity: Not on file  Stress: Not on file  Social Connections: Not on file  Intimate Partner Violence: Not on file    Review of Systems  All other systems reviewed and are negative.   PHYSICAL EXAMINATION:    BP 138/76   Ht 5' (1.524 m)   Wt 163 lb (73.9 kg)   LMP 01/27/2000 (Approximate) Comment: supracervical hysterectomy  BMI 31.83 kg/m     General appearance: alert, cooperative and appears stated age     Pelvic: External genitalia:  no lesions              Urethra:  normal appearing urethra with no masses, tenderness or lesions              Bartholins and Skenes: normal                 Vagina: normal appearing vagina with normal color and discharge, no lesions              Cervix: flush with vagina.  Small pocket at top of vagina, agglutination, broken down with vaginal digital exam.                  Bimanual Exam:  Uterus:  absent              Adnexa: no mass, fullness, tenderness            Chaperone was present for exam:  Estill Bamberg, CMA  ASSESSMENT  Status  post LEEP, 04/2021.  LGSIL on final pathology.  Vaginal atrophy and agglutination.   PLAN  Continue vaginal estrogen 1 gram pv at hs three times a week.  Fu in 2 weeks.  Annual exam with pap and HR HPV in April, 2024.    An After Visit Summary was printed and given to the patient.

## 2021-07-31 ENCOUNTER — Other Ambulatory Visit (HOSPITAL_BASED_OUTPATIENT_CLINIC_OR_DEPARTMENT_OTHER): Payer: Self-pay

## 2021-08-04 ENCOUNTER — Other Ambulatory Visit: Payer: Self-pay | Admitting: Family Medicine

## 2021-08-04 ENCOUNTER — Other Ambulatory Visit (HOSPITAL_BASED_OUTPATIENT_CLINIC_OR_DEPARTMENT_OTHER): Payer: Self-pay

## 2021-08-04 DIAGNOSIS — E1169 Type 2 diabetes mellitus with other specified complication: Secondary | ICD-10-CM

## 2021-08-04 DIAGNOSIS — K219 Gastro-esophageal reflux disease without esophagitis: Secondary | ICD-10-CM

## 2021-08-06 ENCOUNTER — Other Ambulatory Visit (HOSPITAL_BASED_OUTPATIENT_CLINIC_OR_DEPARTMENT_OTHER): Payer: Self-pay

## 2021-08-06 MED ORDER — METFORMIN HCL 1000 MG PO TABS
1000.0000 mg | ORAL_TABLET | Freq: Every day | ORAL | 3 refills | Status: DC
  Filled 2021-08-06: qty 90, 90d supply, fill #0
  Filled 2021-11-20: qty 90, 90d supply, fill #1

## 2021-08-06 MED ORDER — FAMOTIDINE 40 MG PO TABS
40.0000 mg | ORAL_TABLET | Freq: Two times a day (BID) | ORAL | 1 refills | Status: DC
  Filled 2021-08-06: qty 180, 90d supply, fill #0
  Filled 2022-02-24: qty 180, 90d supply, fill #1

## 2021-08-06 MED ORDER — ROSUVASTATIN CALCIUM 40 MG PO TABS
ORAL_TABLET | Freq: Every day | ORAL | 3 refills | Status: DC
  Filled 2021-08-06: qty 90, 90d supply, fill #0
  Filled 2021-08-06: qty 90, fill #0
  Filled 2021-08-07 – 2021-08-18 (×2): qty 90, 90d supply, fill #0
  Filled 2021-08-19: qty 90, fill #0
  Filled 2021-09-17: qty 90, 90d supply, fill #0
  Filled 2022-01-01: qty 90, 90d supply, fill #1
  Filled 2022-03-30: qty 90, 90d supply, fill #2
  Filled 2022-06-29: qty 90, 90d supply, fill #3

## 2021-08-07 ENCOUNTER — Other Ambulatory Visit (HOSPITAL_BASED_OUTPATIENT_CLINIC_OR_DEPARTMENT_OTHER): Payer: Self-pay

## 2021-08-13 ENCOUNTER — Encounter: Payer: Self-pay | Admitting: Obstetrics and Gynecology

## 2021-08-13 ENCOUNTER — Ambulatory Visit: Payer: 59 | Admitting: Obstetrics and Gynecology

## 2021-08-13 ENCOUNTER — Other Ambulatory Visit (HOSPITAL_BASED_OUTPATIENT_CLINIC_OR_DEPARTMENT_OTHER): Payer: Self-pay

## 2021-08-13 VITALS — BP 124/70 | Ht 60.0 in | Wt 163.0 lb

## 2021-08-13 DIAGNOSIS — Z9889 Other specified postprocedural states: Secondary | ICD-10-CM | POA: Diagnosis not present

## 2021-08-13 DIAGNOSIS — N952 Postmenopausal atrophic vaginitis: Secondary | ICD-10-CM

## 2021-08-13 MED ORDER — ESTRADIOL 0.1 MG/GM VA CREA
TOPICAL_CREAM | VAGINAL | 1 refills | Status: DC
  Filled 2021-08-13 – 2021-08-18 (×2): qty 42.5, 90d supply, fill #0
  Filled 2021-08-19: qty 42.5, fill #0
  Filled 2021-08-20 – 2021-09-17 (×2): qty 42.5, 90d supply, fill #0
  Filled 2022-02-03 (×2): qty 42.5, 90d supply, fill #1

## 2021-08-13 NOTE — Progress Notes (Signed)
GYNECOLOGY  VISIT   HPI: 61 y.o.   Divorced  Caucasian  female   [phone] with Maria Nunez's last menstrual period was 01/27/2000 (approximate).   here for 3 month follow up [place] (LEEP), ENDOCERVICAL CURETTAGE (Vagina )  COLPOSCOPY (Vagina )   Final pathology showed LGSIL. Hx positive HR HPV 08/19/20.  Needs a refill of her vaginal estrogen.   GYNECOLOGIC HISTORY: Maria Nunez's last menstrual period was 01/27/2000 (approximate). Contraception:  SUPRACX HYST Menopausal hormone therapy:  Estrogen cream Last mammogram:   10/02/20-neg birads 1 Last pap smear:   04/11/21-unsatisfactory, 08/19/20-WNL, HR HPV+, 07/11/18-WNL, HPV- neg.                OB History     Gravida  3   Para  2   Term  2   Preterm      AB  1   Living  2      SAB  1   IAB      Ectopic      Multiple      Live Births                 Maria Nunez Active Problem List   Diagnosis Date Noted   Nocturnal leg cramps 06/13/2021   CKD (chronic kidney disease) stage 3, GFR 30-59 ml/min (HCC) 06/26/2020   Morbid obesity ([place]) 05/30/2020   Screening for malignant neoplasm of colon 05/30/2020   Gastroesophageal reflux disease without esophagitis 05/16/2020   Atypical chest pain 05/16/2020   Normocytic anemia 05/08/2020   Fatty liver 05/01/2020   Decreased appetite 04/22/2020   Non-intractable vomiting 04/22/2020   OSA (obstructive sleep apnea) 02/08/2020   Plantar fasciitis, bilateral 09/19/2019   Pain in right hand 02/21/2018   Bursitis of left shoulder 01/24/2016   GAD (generalized anxiety disorder) 04/02/2015   Localized primary osteoarthritis of carpometacarpal joint of right thumb 03/15/2015   Carpal tunnel syndrome, bilateral 04/05/2013   LGSIL (low grade squamous intraepithelial dysplasia) 06/16/2012   Type 2 diabetes mellitus with other specified complication ([place]) 44/31/5400   PANIC ATTACK 09/07/2008   RESTLESS LEG SYNDROME 04/02/2008   POLYARTHRITIS 04/02/2008    Primary osteoarthritis of left knee 01/02/2008   HOT FLASHES 08/04/2006   Hyperlipidemia associated with type 2 diabetes mellitus ([place]) 11/03/2005   HYPERTENSION, BENIGN SYSTEMIC 11/03/2005    Past Medical History:  Diagnosis Date   Abnormal ultrasound of ovary 2016   Ultrasound showing bilateral ovaries in contact with one another.  Asymptomatic.   Allergy    multiple med allergies   Anemia 04/29/2020   Arthritis    left knee   Bursitis 01/24/2016   left shoulder   Chest pain 01/29/2021   See 01/29/21 ED note in Epic. Pain significantly improved w/ NTG. CBC, CMP, troponin, BNP unremarkable. EKG showed NSR w/o acute ST changes. 02/19/21 [facility] Stress test - normal.   Chest pain 01/13/2019   Troponin negative x 2, Cxray normal, EKG sinus rhythm w/ borderline T abnormality. Discharged from ER w/ instruction to f/u w/PCP.   Chronic back pain    Chronic kidney disease    Stage 3, follows w/PCP   Complication of anesthesia    Pt states that one time she was told that she was given general anesthesia too fast. She woke up covered in blotches and was given IV Benadryl per pt.   COVID-19 03/05/2021   congestion, fever, all symptoms resolved as of 05/16/21   Diabetes mellitus  Type 2   Fatty liver    Fibroid    hysteretomy in 2002   GERD (gastroesophageal reflux disease)    Maria Nunez is taking Pepcid as of 05/16/21.   Heart murmur    very long time ago per pt on 05/16/21   Hidradenitis    years ago per pt on 05/16/21   High cholesterol    follows w/ PCP Dr. Beatrice Lecher   Hypertension    follows with PCP Dr. Beatrice Lecher   Plantar fasciitis, bilateral 09/24/2020   Sleep apnea    wears CPAP   Spondylosis 2023   lumbar, cervicothoracic region with cervicalgia   Wears contact lenses    Wears glasses     Past Surgical History:  Procedure Laterality Date   BREAST EXCISIONAL BIOPSY Right 2020   BREAST SURGERY     cysts removed right breast, around 20 years ago  per pt on 05/16/21   BREAST SURGERY  08/2018   benign mass   CARPAL TUNNEL RELEASE Right 01/08/2017   Procedure: CARPAL TUNNEL RELEASE, POSSIBLE RIGHT THUMB CARPOMETACARPAL INJECTION;  Surgeon: Roseanne Kaufman, MD;  Location: [place];  Service: Orthopedics;  Laterality: Right;  [place] N/A 05/19/2021   Procedure: COLPOSCOPY;  Surgeon: Nunzio Cobbs, MD;  [facility]: [facility];  Service: Gynecology;  Laterality: N/A;   ESOPHAGOGASTRODUODENOSCOPY  07/19/2020   in Epic   INGUINAL HIDRADENITIS EXCISION     many years ago per pt on 05/16/21   LEEP  05/04/2013   HPV change   LEEP N/A 05/19/2021   Procedure: LOOP ELECTROSURGICAL EXCISION PROCEDURE (LEEP), ENDOCERVICAL CURETTAGE;  Surgeon: Nunzio Cobbs, MD;  Location[facility];  Service: Gynecology;  Laterality: N/A;   SUPRACERVICAL ABDOMINAL HYSTERECTOMY  2002   Done for bleeding, fibroids in [place]    Current Outpatient Medications  Medication Sig Dispense Refill   AMBULATORY NON FORMULARY MEDICATION Set CPAP to 10 cm water pressure. Need download in 2 weeks after change of setting. 1 each 0   Ascorbic Acid (VITAMIN C) 1000 MG tablet Take 1 tablet by mouth daily.     Blood Glucose Monitoring Suppl (FREESTYLE LITE) DEVI      Cyanocobalamin (B-12) 500 MCG TABS Take 1 tablet by mouth daily.     estradiol (ESTRACE) 0.1 MG/GM vaginal cream Insert 1/2 gram vaginally every other night. 42.5 g 1   famotidine (PEPCID) 40 MG tablet Take 1 tablet by mouth two times daily. 916 tablet 1   FOLIC ACID PO Take 384 mcg by mouth daily.     Lancets (FREESTYLE) lancets For use when testing blood sugars daily. DX:E11.69 200 each 12   magnesium oxide (MAG-OX) 400 (240 Mg) MG tablet Take 2 tablets (800 mg total) by mouth at bedtime. 120 tablet 3   melatonin 1 MG TABS tablet Take 1 mg by mouth at bedtime.     metFORMIN (GLUCOPHAGE) 1000 MG tablet  Take 1 tablet (1,000 mg total) by mouth daily with breakfast. 90 tablet 3   pyridOXINE (VITAMIN B-6) 100 MG tablet Take 100 mg by mouth daily.     rosuvastatin (CRESTOR) 40 MG tablet TAKE 1 TABLET (40 MG TOTAL) BY MOUTH DAILY. 90 tablet 3   valsartan (DIOVAN) 40 MG tablet Take 1 tablet (40 mg total) by mouth daily. 90 tablet 3   Zinc Sulfate (ZINC 15 PO) Take by  mouth.     glucose blood (FREESTYLE LITE) test strip USE TO TEST BLOOD SUGARS TWICE DAILY 400 strip 12   ipratropium (ATROVENT HFA) 17 MCG/ACT inhaler Inhale 2 puffs into the lungs in the morning and at bedtime. 12.9 g 1   No current facility-administered medications for this visit.     ALLERGIES: Sulfonamide derivatives, Xigduo xr [dapagliflozin-metformin hcl er], Hycodan [hydrocodone bit-homatrop mbr], Doxycycline, Omeprazole, Cefaclor, Cephalexin, Januvia [sitagliptin], Levofloxacin, Penicillins, Tetracycline, and Victoza [liraglutide]  Family History  Problem Relation Age of Onset   Asthma Mother    COPD Mother    Diabetes Mother    Hypertension Mother    Diabetes Father    Hypertension Father    Breast cancer Maternal Aunt    Breast cancer Paternal Aunt    Colon cancer Neg Hx    Pancreatic cancer Neg Hx    Esophageal cancer Neg Hx    Stomach cancer Neg Hx    Liver disease Neg Hx     Social History   Socioeconomic History   Marital status: Divorced    Spouse name: Not on file   Number of children: Not on file   Years of education: Not on file   Highest education level: Not on file  Occupational History   Not on file  Tobacco Use   Smoking status: Never   Smokeless tobacco: Never  Vaping Use   Vaping Use: Never used  Substance and Sexual Activity   Alcohol use: No    Alcohol/week: 0.0 standard drinks of alcohol   Drug use: No   Sexual activity: Not Currently    Partners: Male    Birth control/protection: Surgical    Comment: TAH/supracervical  Other Topics Concern   Not on file  Social History  Narrative   Not on file   Social Determinants of Health   Financial Resource Strain: Not on file  Food Insecurity: Not on file  Transportation Needs: Not on file  Physical Activity: Not on file  Stress: Not on file  Social Connections: Not on file  Intimate Partner Violence: Not on file    Review of Systems  All other systems reviewed and are negative.   PHYSICAL EXAMINATION:    BP 124/70   Ht 5' (1.524 m)   Wt 163 lb (73.9 kg)   LMP 01/27/2000 (Approximate) Comment: supracervical hysterectomy  BMI 31.83 kg/m     General appearance: alert, cooperative and appears stated age   Pelvic: External genitalia:  no lesions              Urethra:  normal appearing urethra with no masses, tenderness or lesions              Bartholins and Skenes: normal                 Vagina: normal appearing vagina with normal color and discharge, no lesions              Cervix: flush with the vagina.  No agglutination.  No pockets at the top of the vagina.  Vaginal cuff bleeds slightly with speculum placement.                 Bimanual Exam:  Uterus:  normal size, contour, position, consistency, mobility, non-tender              Adnexa: no mass, fullness, tenderness               Chaperone was present for exam:  Santiago Glad, CMA  ASSESSMENT  Status post LEEP.  Vaginal atrophy, improving with local vaginal estrogen treatment.   PLAN  Continue vaginal estrogen 1/2 gram pv at hs three times per week.  Mammogram in September.  Pap and annual exam in April, 2024.    An After Visit Summary was printed and given to the Maria Nunez.

## 2021-08-18 ENCOUNTER — Other Ambulatory Visit (HOSPITAL_BASED_OUTPATIENT_CLINIC_OR_DEPARTMENT_OTHER): Payer: Self-pay

## 2021-08-19 ENCOUNTER — Other Ambulatory Visit: Payer: Self-pay | Admitting: Obstetrics and Gynecology

## 2021-08-19 ENCOUNTER — Other Ambulatory Visit (HOSPITAL_BASED_OUTPATIENT_CLINIC_OR_DEPARTMENT_OTHER): Payer: Self-pay

## 2021-08-19 DIAGNOSIS — Z1231 Encounter for screening mammogram for malignant neoplasm of breast: Secondary | ICD-10-CM

## 2021-08-20 ENCOUNTER — Ambulatory Visit: Payer: 59 | Admitting: Obstetrics and Gynecology

## 2021-08-20 ENCOUNTER — Telehealth: Payer: Self-pay

## 2021-08-20 ENCOUNTER — Other Ambulatory Visit: Payer: Self-pay

## 2021-08-20 ENCOUNTER — Other Ambulatory Visit (HOSPITAL_BASED_OUTPATIENT_CLINIC_OR_DEPARTMENT_OTHER): Payer: Self-pay

## 2021-08-20 ENCOUNTER — Other Ambulatory Visit: Payer: Self-pay | Admitting: Obstetrics and Gynecology

## 2021-08-20 NOTE — Telephone Encounter (Signed)
Patient called because she tried to refill her Estrace Cream but pharmacy said insurance co will not allow it yet as she just filled it at the end of May for 90 days. Patient said that Dr. Quincy Simmonds instructed her to use 1 gram tiw for a few weeks(07/30/21 "Continue vaginal estrogen 1 gram pv at hs three times a week.") and patient attributes this to coming up short. I called the pharmacy and spoke with the pharmacist and she said the earliest that they can fill the 08/13/21 Rx is on 08/31/21.  Patient was informed that pharmacist said to call her back and they could discuss option of trying to appeal the insurance company for earlier fill date. Patient said she is fine with waiting and getting if filled on 08/31/21.

## 2021-08-25 DIAGNOSIS — G4733 Obstructive sleep apnea (adult) (pediatric): Secondary | ICD-10-CM | POA: Diagnosis not present

## 2021-09-09 DIAGNOSIS — M47816 Spondylosis without myelopathy or radiculopathy, lumbar region: Secondary | ICD-10-CM | POA: Diagnosis not present

## 2021-09-09 DIAGNOSIS — M62830 Muscle spasm of back: Secondary | ICD-10-CM | POA: Diagnosis not present

## 2021-09-09 DIAGNOSIS — M47813 Spondylosis without myelopathy or radiculopathy, cervicothoracic region: Secondary | ICD-10-CM | POA: Diagnosis not present

## 2021-09-09 DIAGNOSIS — M542 Cervicalgia: Secondary | ICD-10-CM | POA: Diagnosis not present

## 2021-09-10 ENCOUNTER — Ambulatory Visit (INDEPENDENT_AMBULATORY_CARE_PROVIDER_SITE_OTHER): Payer: 59 | Admitting: Family Medicine

## 2021-09-10 ENCOUNTER — Encounter: Payer: Self-pay | Admitting: Family Medicine

## 2021-09-10 VITALS — BP 130/62 | HR 83 | Ht 59.0 in | Wt 159.0 lb

## 2021-09-10 DIAGNOSIS — Z0000 Encounter for general adult medical examination without abnormal findings: Secondary | ICD-10-CM | POA: Diagnosis not present

## 2021-09-10 DIAGNOSIS — E1169 Type 2 diabetes mellitus with other specified complication: Secondary | ICD-10-CM | POA: Diagnosis not present

## 2021-09-10 NOTE — Progress Notes (Signed)
Complete physical exam  Patient: Maria Nunez   DOB: [birth year]   61 y.o. Female  MRN: [id number]  Subjective:    No chief complaint on file.   Maria Nunez is a 61 y.o. female who presents today for a complete physical exam. She reports consuming a general diet. Home exercise routine includes walking on treadmill 3 days per week. She generally feels well. She reports sleeping fairly well. She does not have additional problems to discuss today.    Most recent fall risk assessment:    07/28/2021    2:07 PM  [place] in the past year? 0  Number falls in past yr: 0  Injury with Fall? 0  Risk for fall due to : No Fall Risks  Follow up Falls prevention discussed;Falls evaluation completed     Most recent depression screenings:    07/28/2021    2:07 PM 02/20/2021    8:37 AM  PHQ 2/9 Scores  PHQ - 2 Score 0 0  PHQ- 9 Score 0       Past Surgical History:  Procedure Laterality Date   BREAST EXCISIONAL BIOPSY Right 2020   BREAST SURGERY     cysts removed right breast, around 20 years ago per pt on 05/16/21   BREAST SURGERY  08/2018   benign mass   CARPAL TUNNEL RELEASE Right 01/08/2017   Procedure: CARPAL TUNNEL RELEASE, POSSIBLE RIGHT THUMB CARPOMETACARPAL INJECTION;  Surgeon: Roseanne Kaufman, MD;  Location: [place];  Service: Orthopedics;  Laterality: Right;  [place] N/A 05/19/2021   Procedure: COLPOSCOPY;  Surgeon: Nunzio Cobbs, MD;  Location: [facility];  Service: Gynecology;  Laterality: N/A;   ESOPHAGOGASTRODUODENOSCOPY  07/19/2020   in Epic   INGUINAL HIDRADENITIS EXCISION     many years ago per pt on 05/16/21   LEEP  05/04/2013   HPV change   LEEP N/A 05/19/2021   Procedure: LOOP ELECTROSURGICAL EXCISION PROCEDURE (LEEP), ENDOCERVICAL CURETTAGE;  Surgeon: Nunzio Cobbs, MD;  Location: [facility];  Service: Gynecology;   Laterality: N/A;   SUPRACERVICAL ABDOMINAL HYSTERECTOMY  2002   Done for bleeding, fibroids in [place] History   Tobacco Use   Smoking status: Never   Smokeless tobacco: Never  Vaping Use   Vaping Use: Never used  Substance Use Topics   Alcohol use: No    Alcohol/week: 0.0 standard drinks of alcohol   Drug use: No   Allergies  Allergen Reactions   Sulfonamide Derivatives Hives    REACTION: hives   Xigduo Xr [Dapagliflozin-Metformin Hcl Er] Hives   Hycodan [Hydrocodone Bit-Homatrop Mbr] Itching   Doxycycline Nausea And Vomiting   Omeprazole Itching   Cefaclor Rash   Cephalexin Rash   Januvia [Sitagliptin] Nausea Only    Back pain   Levofloxacin Itching and Rash   Penicillins Other (See Comments)    unknown   Tetracycline Nausea Only    GI upset   Victoza [Liraglutide] Other (See Comments)    Back pain      Patient Care Team: Hali Marry, MD as PCP - General Nunzio Cobbs, MD as Consulting Physician (Obstetrics and Gynecology)   Outpatient Medications Prior to Visit  Medication Sig   AMBULATORY NON FORMULARY MEDICATION Set CPAP to 10 cm water pressure. Need download in 2 weeks after change  of setting.   Ascorbic Acid (VITAMIN C) 1000 MG tablet Take 1 tablet by mouth daily.   Blood Glucose Monitoring Suppl (FREESTYLE LITE) DEVI    Cyanocobalamin (B-12) 500 MCG TABS Take 1 tablet by mouth daily.   estradiol (ESTRACE) 0.1 MG/GM vaginal cream Insert 1/2 gram vaginally three times a week.   famotidine (PEPCID) 40 MG tablet Take 1 tablet by mouth two times daily.   FOLIC ACID PO Take 063 mcg by mouth daily.   Lancets (FREESTYLE) lancets For use when testing blood sugars daily. DX:E11.69   magnesium oxide (MAG-OX) 400 (240 Mg) MG tablet Take 2 tablets (800 mg total) by mouth at bedtime.   melatonin 1 MG TABS tablet Take 1 mg by mouth at bedtime.   metFORMIN (GLUCOPHAGE) 1000 MG tablet Take 1 tablet (1,000 mg total) by mouth daily with breakfast.    pyridOXINE (VITAMIN B-6) 100 MG tablet Take 100 mg by mouth daily.   rosuvastatin (CRESTOR) 40 MG tablet TAKE 1 TABLET (40 MG TOTAL) BY MOUTH DAILY.   valsartan (DIOVAN) 40 MG tablet Take 1 tablet (40 mg total) by mouth daily.   Zinc Sulfate (ZINC 15 PO) Take by mouth.   glucose blood (FREESTYLE LITE) test strip USE TO TEST BLOOD SUGARS TWICE DAILY   [DISCONTINUED] ipratropium (ATROVENT HFA) 17 MCG/ACT inhaler Inhale 2 puffs into the lungs in the morning and at bedtime.   No facility-administered medications prior to visit.    ROS        Objective:     BP (!) 143/69   Pulse 83   Ht '4\' 11"'$  (1.499 m)   Wt 159 lb (72.1 kg)   LMP 01/27/2000 (Approximate) Comment: supracervical hysterectomy  SpO2 98%   BMI 32.11 kg/m    Physical Exam Vitals reviewed.  Constitutional:      Appearance: She is well-developed.  HENT:     Head: Normocephalic and atraumatic.     Right Ear: Tympanic membrane, ear canal and external ear normal.     Left Ear: Tympanic membrane, ear canal and external ear normal.     Nose: Nose normal.     Mouth/Throat:     Pharynx: Oropharynx is clear.  Eyes:     Conjunctiva/sclera: Conjunctivae normal.     Pupils: Pupils are equal, round, and reactive to light.  Neck:     Thyroid: No thyromegaly.  Cardiovascular:     Rate and Rhythm: Normal rate and regular rhythm.     Heart sounds: Normal heart sounds.  Pulmonary:     Effort: Pulmonary effort is normal.     Breath sounds: Normal breath sounds. No wheezing.  Abdominal:     General: Abdomen is flat. Bowel sounds are normal.     Palpations: Abdomen is soft.  Musculoskeletal:     Cervical back: Neck supple.  Lymphadenopathy:     Cervical: No cervical adenopathy.  Skin:    General: Skin is warm and dry.     Coloration: Skin is not pale.  Neurological:     General: No focal deficit present.     Mental Status: She is alert and oriented to person, place, and time.  Psychiatric:        Mood and Affect:  Mood normal.        Behavior: Behavior normal.      No results found for any visits on 09/10/21.     Assessment & Plan:    Routine Health Maintenance and Physical Exam  Immunization History  Administered Date(s) Administered   Influenza Split 10/27/2011   Influenza Whole 12/02/2005   Influenza,inj,Quad PF,6+ Mos 10/19/2012, 01/02/2014, 10/15/2015, 11/08/2018, 11/08/2019, 10/22/2020   Influenza-Unspecified 10/20/2012, 11/10/2017   PFIZER(Purple Top)SARS-COV-2 Vaccination 04/06/2019, 04/28/2019, 01/23/2020   PNEUMOCOCCAL CONJUGATE-20 08/27/2020   Pneumococcal Polysaccharide-23 04/18/2012, 04/18/2012   Td 01/27/2004   Tdap 04/03/2014    Health Maintenance  Topic Date Due   COVID-19 Vaccine (4 - Pfizer series) 03/19/2020   OPHTHALMOLOGY EXAM  05/30/2021   INFLUENZA VACCINE  08/26/2021   Zoster Vaccines- Shingrix (1 of 2) 10/28/2021 (Originally 08/21/2010)   Diabetic kidney evaluation - Urine ACR  11/06/2021   HEMOGLOBIN A1C  01/28/2022   FOOT EXAM  02/20/2022   Diabetic kidney evaluation - GFR measurement  05/20/2022   MAMMOGRAM  10/03/2022   COLONOSCOPY (Pts 45-87yr Insurance coverage will need to be confirmed)  07/25/2023   TETANUS/TDAP  04/02/2024   PAP SMEAR-Modifier  04/11/2024   Hepatitis C Screening  Completed   HIV Screening  Completed   Pneumococcal Vaccine [id number] old  Aged Out   HPV VACCINES  Aged Out    Discussed health benefits of physical activity, and encouraged her to engage in regular exercise appropriate for her age and condition.  Problem List Items Addressed This Visit       Endocrine   Type 2 diabetes mellitus with other specified complication ([place]   Relevant Orders   Urine Microalbumin w/creat. ratio   Other Visit Diagnoses     Wellness examination    -  Primary   Relevant Orders   TSH   Lipid Panel w/reflex Direct LDL   COMPLETE METABOLIC PANEL WITH GFR   CBC with Differential/Platelet   Urine Microalbumin w/creat. ratio       No follow-ups on file.  Keep up a regular exercise program and make sure you are eating a healthy diet Try to eat 4 servings of dairy a day, or if you are lactose intolerant take a calcium with vitamin D daily.  Your vaccines are up to date.      CBeatrice Lecher MD

## 2021-09-11 LAB — TSH: TSH: 1.2 mIU/L (ref 0.40–4.50)

## 2021-09-11 LAB — MICROALBUMIN / CREATININE URINE RATIO
Creatinine, Urine: 86 mg/dL (ref 20–275)
Microalb Creat Ratio: 81 mcg/mg creat — ABNORMAL HIGH (ref <30)
Microalb, Ur: 7 mg/dL

## 2021-09-11 LAB — COMPLETE METABOLIC PANEL WITH GFR
AG Ratio: 2 (calc) (ref 1.0–2.5)
ALT: 18 U/L (ref 6–29)
AST: 17 U/L (ref 10–35)
Albumin: 4.3 g/dL (ref 3.6–5.1)
Alkaline phosphatase (APISO): 70 U/L (ref 37–153)
BUN/Creatinine Ratio: 16 (calc) (ref 6–22)
BUN: 21 mg/dL (ref 7–25)
CO2: 27 mmol/L (ref 20–32)
Calcium: 9.6 mg/dL (ref 8.6–10.4)
Chloride: 106 mmol/L (ref 98–110)
Creat: 1.34 mg/dL — ABNORMAL HIGH (ref 0.50–1.05)
Globulin: 2.1 g/dL (calc) (ref 1.9–3.7)
Glucose, Bld: 130 mg/dL — ABNORMAL HIGH (ref 65–99)
Potassium: 3.8 mmol/L (ref 3.5–5.3)
Sodium: 140 mmol/L (ref 135–146)
Total Bilirubin: 0.4 mg/dL (ref 0.2–1.2)
Total Protein: 6.4 g/dL (ref 6.1–8.1)
eGFR: 45 mL/min/{1.73_m2} — ABNORMAL LOW (ref >=60)

## 2021-09-11 LAB — LIPID PANEL W/REFLEX DIRECT LDL
Cholesterol: 127 mg/dL (ref <200)
HDL: 45 mg/dL — ABNORMAL LOW (ref >=50)
LDL Cholesterol (Calc): 57 mg/dL (calc)
Non-HDL Cholesterol (Calc): 82 mg/dL (calc) (ref <130)
Total CHOL/HDL Ratio: 2.8 (calc) (ref <5.0)
Triglycerides: 172 mg/dL — ABNORMAL HIGH (ref <150)

## 2021-09-11 LAB — CBC WITH DIFFERENTIAL/PLATELET
Absolute Monocytes: 448 cells/uL (ref 200–950)
Basophils Absolute: 32 cells/uL (ref 0–200)
Basophils Relative: 0.5 %
Eosinophils Absolute: 122 cells/uL (ref 15–500)
Eosinophils Relative: 1.9 %
HCT: 36.7 % (ref 35.0–45.0)
Hemoglobin: 11.9 g/dL (ref 11.7–15.5)
Lymphs Abs: 1894 cells/uL (ref 850–3900)
MCH: 27.9 pg (ref 27.0–33.0)
MCHC: 32.4 g/dL (ref 32.0–36.0)
MCV: 85.9 fL (ref 80.0–100.0)
MPV: 9.9 fL (ref 7.5–12.5)
Monocytes Relative: 7 %
Neutro Abs: 3904 cells/uL (ref 1500–7800)
Neutrophils Relative %: 61 %
Platelets: 251 10*3/uL (ref 140–400)
RBC: 4.27 10*6/uL (ref 3.80–5.10)
RDW: 13.2 % (ref 11.0–15.0)
Total Lymphocyte: 29.6 %
WBC: 6.4 10*3/uL (ref 3.8–10.8)

## 2021-09-11 NOTE — Progress Notes (Signed)
[place], total cholesterol and LDL look great!  Triglycerides are up a little bit this year compared to the last couple of years.  Kidney function is stable in fact it actually looks better.  Its been running around 1 point for the last couple of times it was actually 1.3 this time which is awesome!  Liver function is normal.  Still spilling a little bit of protein into the urine similar than last year but we will keep an eye on this.  Thyroid looks great.  And hemoglobin looks good this year!

## 2021-09-17 ENCOUNTER — Other Ambulatory Visit (HOSPITAL_BASED_OUTPATIENT_CLINIC_OR_DEPARTMENT_OTHER): Payer: Self-pay

## 2021-09-17 ENCOUNTER — Telehealth: Payer: 59 | Admitting: Physician Assistant

## 2021-09-17 DIAGNOSIS — H00014 Hordeolum externum left upper eyelid: Secondary | ICD-10-CM

## 2021-09-17 MED ORDER — ERYTHROMYCIN 5 MG/GM OP OINT: 1.0000 | TOPICAL_OINTMENT | Freq: Three times a day (TID) | OPHTHALMIC | 0 refills | Status: DC

## 2021-09-17 NOTE — Progress Notes (Signed)
Virtual Visit Consent   Maria Nunez, you are scheduled for a virtual visit with a [facility] provider today. Just as with appointments in the office, your consent must be obtained to participate. Your consent will be active for this visit and any virtual visit you may have with one of our providers in the next 365 days. If you have a MyChart account, a copy of this consent can be sent to you electronically.  As this is a virtual visit, video technology does not allow for your provider to perform a traditional examination. This may limit your provider's ability to fully assess your condition. If your provider identifies any concerns that need to be evaluated in person or the need to arrange testing (such as labs, EKG, etc.), we will make arrangements to do so. Although advances in technology are sophisticated, we cannot ensure that it will always work on either your end or our end. If the connection with a video visit is poor, the visit may have to be switched to a telephone visit. With either a video or telephone visit, we are not always able to ensure that we have a secure connection.  By engaging in this virtual visit, you consent to the provision of healthcare and authorize for your insurance to be billed (if applicable) for the services provided during this visit. Depending on your insurance coverage, you may receive a charge related to this service.  I need to obtain your verbal consent now. Are you willing to proceed with your visit today? Jazminn Pomales has provided verbal consent on 09/17/2021 for a virtual visit (video or telephone). Mar Daring, PA-C  Date: 09/17/2021 3:55 PM  Virtual Visit via Video Note   I, Mar Daring, connected with  Maria Nunez  ([id number], July 07, 1960) on 09/17/21 at  4:00 PM EDT by a video-enabled telemedicine application and verified that I am speaking with the correct person using two identifiers.  Location: Patient:  Virtual Visit Location Patient: Home Provider: Virtual Visit Location Provider: Home Office   I discussed the limitations of evaluation and management by telemedicine and the availability of in person appointments. The patient expressed understanding and agreed to proceed.    History of Present Illness: Maria Nunez is a 61 y.o. who identifies as a female who was assigned female at birth, and is being seen today for stye.  HPI: Eye Problem  The left eye is affected. This is a new problem. The current episode started in the past 7 days (2 days). The problem occurs constantly. The problem has been gradually worsening. There was no injury mechanism. The patient is experiencing no pain. There is No known exposure to pink eye. She Does not wear contacts. Associated symptoms include an eye discharge and eye redness. Pertinent negatives include no blurred vision, double vision, fever, nausea or photophobia. Treatments tried: warm compresses. The treatment provided no relief.      Problems:  Patient Active Problem List   Diagnosis Date Noted   Nocturnal leg cramps 06/13/2021   CKD (chronic kidney disease) stage 3, GFR 30-59 ml/min (HCC) 06/26/2020   Morbid obesity ([place]) 05/30/2020   Screening for malignant neoplasm of colon 05/30/2020   Gastroesophageal reflux disease without esophagitis 05/16/2020   Atypical chest pain 05/16/2020   Normocytic anemia 05/08/2020   Fatty liver 05/01/2020   Decreased appetite 04/22/2020   Non-intractable vomiting 04/22/2020   OSA (obstructive sleep apnea) 02/08/2020   Plantar fasciitis, bilateral 09/19/2019   Pain in  right hand 02/21/2018   Bursitis of left shoulder 01/24/2016   GAD (generalized anxiety disorder) 04/02/2015   Localized primary osteoarthritis of carpometacarpal joint of right thumb 03/15/2015   Carpal tunnel syndrome, bilateral 04/05/2013   LGSIL (low grade squamous intraepithelial dysplasia) 06/16/2012   Type 2 diabetes mellitus with  other specified complication ([place]) 22/29/7989   PANIC ATTACK 09/07/2008   RESTLESS LEG SYNDROME 04/02/2008   POLYARTHRITIS 04/02/2008   Primary osteoarthritis of left knee 01/02/2008   HOT FLASHES 08/04/2006   Hyperlipidemia associated with type 2 diabetes mellitus ([place]) 11/03/2005   HYPERTENSION, BENIGN SYSTEMIC 11/03/2005    Allergies:  Allergies  Allergen Reactions   Sulfonamide Derivatives Hives    REACTION: hives   Xigduo Xr [Dapagliflozin-Metformin Hcl Er] Hives   Hycodan [Hydrocodone Bit-Homatrop Mbr] Itching   Doxycycline Nausea And Vomiting   Omeprazole Itching   Cefaclor Rash   Cephalexin Rash   Januvia [Sitagliptin] Nausea Only    Back pain   Levofloxacin Itching and Rash   Penicillins Other (See Comments)    unknown   Tetracycline Nausea Only    GI upset   Victoza [Liraglutide] Other (See Comments)    Back pain   Medications:  Current Outpatient Medications:    erythromycin ophthalmic ointment, Place 1 Application into the left eye 3 (three) times daily., Disp: 14 g, Rfl: 0   AMBULATORY NON FORMULARY MEDICATION, Set CPAP to 10 cm water pressure. Need download in 2 weeks after change of setting., Disp: 1 each, Rfl: 0   Ascorbic Acid (VITAMIN C) 1000 MG tablet, Take 1 tablet by mouth daily., Disp: , Rfl:    Blood Glucose Monitoring Suppl (FREESTYLE LITE) DEVI, , Disp: , Rfl:    Cyanocobalamin (B-12) 500 MCG TABS, Take 1 tablet by mouth daily., Disp: , Rfl:    estradiol (ESTRACE) 0.1 MG/GM vaginal cream, Insert 1/2 gram vaginally three times a week., Disp: 42.5 g, Rfl: 1   famotidine (PEPCID) 40 MG tablet, Take 1 tablet by mouth two times daily., Disp: 180 tablet, Rfl: 1   FOLIC ACID PO, Take 211 mcg by mouth daily., Disp: , Rfl:    glucose blood (FREESTYLE LITE) test strip, USE TO TEST BLOOD SUGARS TWICE DAILY, Disp: 400 strip, Rfl: 12   Lancets (FREESTYLE) lancets, For use when testing blood sugars daily. DX:E11.69, Disp: 200 each, Rfl: 12   magnesium oxide  (MAG-OX) 400 (240 Mg) MG tablet, Take 2 tablets (800 mg total) by mouth at bedtime., Disp: 120 tablet, Rfl: 3   melatonin 1 MG TABS tablet, Take 1 mg by mouth at bedtime., Disp: , Rfl:    metFORMIN (GLUCOPHAGE) 1000 MG tablet, Take 1 tablet (1,000 mg total) by mouth daily with breakfast., Disp: 90 tablet, Rfl: 3   pyridOXINE (VITAMIN B-6) 100 MG tablet, Take 100 mg by mouth daily., Disp: , Rfl:    rosuvastatin (CRESTOR) 40 MG tablet, TAKE 1 TABLET (40 MG TOTAL) BY MOUTH DAILY., Disp: 90 tablet, Rfl: 3   valsartan (DIOVAN) 40 MG tablet, Take 1 tablet (40 mg total) by mouth daily., Disp: 90 tablet, Rfl: 3   Zinc Sulfate (ZINC 15 PO), Take by mouth., Disp: , Rfl:   Observations/Objective: Patient is well-developed, well-nourished in no acute distress.  Resting comfortably at home.  Head is normocephalic, atraumatic.  No labored breathing.  Speech is clear and coherent with logical content.  Patient is alert and oriented at baseline.  Mild swelling and redness noted around the left upper eyelid  Assessment and  Plan: 1. Hordeolum externum of left upper eyelid - erythromycin ophthalmic ointment; Place 1 Application into the left eye 3 (three) times daily.  Dispense: 14 g; Refill: 0  - Stye noted - Erythromycin ointment prescribed - Warm compresses - Good hand hygiene - Seek in person evaluation if symptoms worsen or fail to improve   Follow Up Instructions: I discussed the assessment and treatment plan with the patient. The patient was provided an opportunity to ask questions and all were answered. The patient agreed with the plan and demonstrated an understanding of the instructions.  A copy of instructions were sent to the patient via MyChart unless otherwise noted below.    The patient was advised to call back or seek an in-person evaluation if the symptoms worsen or if the condition fails to improve as anticipated.  Time:  I spent 8 minutes with the patient via telehealth technology  discussing the above problems/concerns.    Mar Daring, PA-C

## 2021-09-17 NOTE — Patient Instructions (Signed)
Jonna Coup, thank you for joining Mar Daring, PA-C for today's virtual visit.  While this provider is not your primary care provider (PCP), if your PCP is located in our provider database this encounter information will be shared with them immediately following your visit.  Consent: (Patient) Maria Nunez provided verbal consent for this virtual visit at the beginning of the encounter.  Current Medications:  Current Outpatient Medications:    erythromycin ophthalmic ointment, Place 1 Application into the left eye 3 (three) times daily., Disp: 14 g, Rfl: 0   AMBULATORY NON FORMULARY MEDICATION, Set CPAP to 10 cm water pressure. Need download in 2 weeks after change of setting., Disp: 1 each, Rfl: 0   Ascorbic Acid (VITAMIN C) 1000 MG tablet, Take 1 tablet by mouth daily., Disp: , Rfl:    Blood Glucose Monitoring Suppl (FREESTYLE LITE) DEVI, , Disp: , Rfl:    Cyanocobalamin (B-12) 500 MCG TABS, Take 1 tablet by mouth daily., Disp: , Rfl:    estradiol (ESTRACE) 0.1 MG/GM vaginal cream, Insert 1/2 gram vaginally three times a week., Disp: 42.5 g, Rfl: 1   famotidine (PEPCID) 40 MG tablet, Take 1 tablet by mouth two times daily., Disp: 180 tablet, Rfl: 1   FOLIC ACID PO, Take 937 mcg by mouth daily., Disp: , Rfl:    glucose blood (FREESTYLE LITE) test strip, USE TO TEST BLOOD SUGARS TWICE DAILY, Disp: 400 strip, Rfl: 12   Lancets (FREESTYLE) lancets, For use when testing blood sugars daily. DX:E11.69, Disp: 200 each, Rfl: 12   magnesium oxide (MAG-OX) 400 (240 Mg) MG tablet, Take 2 tablets (800 mg total) by mouth at bedtime., Disp: 120 tablet, Rfl: 3   melatonin 1 MG TABS tablet, Take 1 mg by mouth at bedtime., Disp: , Rfl:    metFORMIN (GLUCOPHAGE) 1000 MG tablet, Take 1 tablet (1,000 mg total) by mouth daily with breakfast., Disp: 90 tablet, Rfl: 3   pyridOXINE (VITAMIN B-6) 100 MG tablet, Take 100 mg by mouth daily., Disp: , Rfl:    rosuvastatin (CRESTOR) 40 MG  tablet, TAKE 1 TABLET (40 MG TOTAL) BY MOUTH DAILY., Disp: 90 tablet, Rfl: 3   valsartan (DIOVAN) 40 MG tablet, Take 1 tablet (40 mg total) by mouth daily., Disp: 90 tablet, Rfl: 3   Zinc Sulfate (ZINC 15 PO), Take by mouth., Disp: , Rfl:    Medications ordered in this encounter:  Meds ordered this encounter  Medications   erythromycin ophthalmic ointment    Sig: Place 1 Application into the left eye 3 (three) times daily.    Dispense:  14 g    Refill:  0    Order Specific Question:   Supervising Provider    Answer:   Sabra Heck, [place]     *If you need refills on other medications prior to your next appointment, please contact your pharmacy*  Follow-Up: Call back or seek an in-person evaluation if the symptoms worsen or if the condition fails to improve as anticipated.  Other Instructions Stye A stye, also known as a hordeolum, is a bump that forms on an eyelid. It may look like a pimple next to the eyelash. A stye can form inside the eyelid (internal stye) or outside the eyelid (external stye). A stye can cause redness, swelling, and pain on the eyelid. Styes are very common. Anyone can get them at any age. They usually occur in just one eye at a time, but you may have more than one in either  eye. What are the causes? A stye is caused by an infection. The infection is almost always caused by bacteria called Staphylococcus aureus. This is a common type of bacteria that lives on the skin. An internal stye may result from an infected oil-producing gland inside the eyelid. An external stye may be caused by an infection at the base of the eyelash (hair follicle). What increases the risk? You are more likely to develop a stye if: You have had a stye before. You have any of these conditions: Red, itchy, inflamed eyelids (blepharitis). A skin condition such as seborrheic dermatitis or rosacea. High fat levels in your blood (lipids). Dry eyes. What are the signs or symptoms? The most  common symptom of a stye is eyelid pain. Internal styes are more painful than external styes. Other symptoms may include: Painful swelling of your eyelid. A scratchy feeling in your eye. Tearing and redness of your eye. A pimple-like bump on the edge of the eyelid. Pus draining from the stye. How is this diagnosed? Your health care provider may be able to diagnose a stye just by examining your eye. The health care provider may also check to make sure: You do not have a fever or other signs of a more serious infection. The infection has not spread to other parts of your eye or areas around your eye. How is this treated? Most styes will clear up in a few days without treatment or with warm compresses applied to the area. You may need to use antibiotic drops or ointment to treat an infection. Sometimes, steroid drops or ointment are used in addition to antibiotics. In some cases, your health care provider may give you a small steroid injection in the eyelid. If your stye does not heal with routine treatment, your health care provider may drain pus from the stye using a thin blade or needle. This may be done if the stye is large, causing a lot of pain, or affecting your vision. Follow these instructions at home: Take over-the-counter and prescription medicines only as told by your health care provider. This includes eye drops or ointments. If you were prescribed an antibiotic medicine, steroid medicine, or both, apply or use them as told by your health care provider. Do not stop using the medicine even if your condition improves. Apply a warm, wet cloth (warm compress) to your eye for 5-10 minutes, 4 to 6 times a day. Clean the affected eyelid as directed by your health care provider. Do not wear contact lenses or eye makeup until your stye has healed and your health care provider says that it is safe. Do not try to pop or drain the stye. Do not rub your eye. Contact a health care provider if: You  have chills or a fever. Your stye does not go away after several days. Your stye affects your vision. Your eyeball becomes swollen, red, or painful. Get help right away if: You have pain when moving your eye around. Summary A stye is a bump that forms on an eyelid. It may look like a pimple next to the eyelash. A stye can form inside the eyelid (internal stye) or outside the eyelid (external stye). A stye can cause redness, swelling, and pain on the eyelid. Your health care provider may be able to diagnose a stye just by examining your eye. Apply a warm, wet cloth (warm compress) to your eye for 5-10 minutes, 4 to 6 times a day. This information is not intended to replace  advice given to you by your health care provider. Make sure you discuss any questions you have with your health care provider. Document Revised: 03/20/2020 Document Reviewed: 03/20/2020 Elsevier Patient Education  [place].    If you have been instructed to have an in-person evaluation today at a local Urgent Care facility, please use the link below. It will take you to a list of all of our available [facility], including address, phone number and hours of operation. Please do not delay care.  [facility]  If you or a family member do not have a primary care provider, use the link below to schedule a visit and establish care. When you choose a [facility] primary care physician or advanced practice provider, you gain a long-term partner in health. Find a Primary Care Provider  Learn more about [facility]'s in-office and virtual care options: [facility]

## 2021-09-23 ENCOUNTER — Ambulatory Visit: Payer: 59 | Admitting: Obstetrics and Gynecology

## 2021-09-25 DIAGNOSIS — M62830 Muscle spasm of back: Secondary | ICD-10-CM | POA: Diagnosis not present

## 2021-09-25 DIAGNOSIS — M542 Cervicalgia: Secondary | ICD-10-CM | POA: Diagnosis not present

## 2021-09-25 DIAGNOSIS — M47813 Spondylosis without myelopathy or radiculopathy, cervicothoracic region: Secondary | ICD-10-CM | POA: Diagnosis not present

## 2021-09-25 DIAGNOSIS — M47816 Spondylosis without myelopathy or radiculopathy, lumbar region: Secondary | ICD-10-CM | POA: Diagnosis not present

## 2021-10-03 ENCOUNTER — Ambulatory Visit: Payer: 59

## 2021-10-15 ENCOUNTER — Ambulatory Visit
Admission: RE | Admit: 2021-10-15 | Discharge: 2021-10-15 | Disposition: A | Discharge status: Home / Self Care | Payer: 59 | Source: Ambulatory Visit | Attending: Obstetrics and Gynecology | Admitting: Obstetrics and Gynecology

## 2021-10-15 DIAGNOSIS — Z1231 Encounter for screening mammogram for malignant neoplasm of breast: Secondary | ICD-10-CM

## 2021-10-17 NOTE — Progress Notes (Signed)
Please call patient. Normal mammogram.  Repeat in 1 year.  

## 2021-10-20 DIAGNOSIS — M47813 Spondylosis without myelopathy or radiculopathy, cervicothoracic region: Secondary | ICD-10-CM | POA: Diagnosis not present

## 2021-10-20 DIAGNOSIS — M62830 Muscle spasm of back: Secondary | ICD-10-CM | POA: Diagnosis not present

## 2021-10-20 DIAGNOSIS — M47816 Spondylosis without myelopathy or radiculopathy, lumbar region: Secondary | ICD-10-CM | POA: Diagnosis not present

## 2021-10-20 DIAGNOSIS — M542 Cervicalgia: Secondary | ICD-10-CM | POA: Diagnosis not present

## 2021-11-09 DIAGNOSIS — I10 Essential (primary) hypertension: Secondary | ICD-10-CM | POA: Diagnosis not present

## 2021-11-09 DIAGNOSIS — R058 Other specified cough: Secondary | ICD-10-CM | POA: Diagnosis not present

## 2021-11-09 DIAGNOSIS — U071 COVID-19: Secondary | ICD-10-CM | POA: Diagnosis not present

## 2021-11-09 DIAGNOSIS — R0981 Nasal congestion: Secondary | ICD-10-CM | POA: Diagnosis not present

## 2021-11-17 DIAGNOSIS — M47816 Spondylosis without myelopathy or radiculopathy, lumbar region: Secondary | ICD-10-CM | POA: Diagnosis not present

## 2021-11-17 DIAGNOSIS — M62830 Muscle spasm of back: Secondary | ICD-10-CM | POA: Diagnosis not present

## 2021-11-17 DIAGNOSIS — M542 Cervicalgia: Secondary | ICD-10-CM | POA: Diagnosis not present

## 2021-11-17 DIAGNOSIS — M47813 Spondylosis without myelopathy or radiculopathy, cervicothoracic region: Secondary | ICD-10-CM | POA: Diagnosis not present

## 2021-11-18 DIAGNOSIS — G4733 Obstructive sleep apnea (adult) (pediatric): Secondary | ICD-10-CM | POA: Diagnosis not present

## 2021-11-20 ENCOUNTER — Other Ambulatory Visit (HOSPITAL_BASED_OUTPATIENT_CLINIC_OR_DEPARTMENT_OTHER): Payer: Self-pay

## 2021-11-28 ENCOUNTER — Ambulatory Visit: Payer: 59 | Admitting: Sports Medicine

## 2021-11-28 ENCOUNTER — Ambulatory Visit (INDEPENDENT_AMBULATORY_CARE_PROVIDER_SITE_OTHER): Payer: 59

## 2021-11-28 DIAGNOSIS — M722 Plantar fascial fibromatosis: Secondary | ICD-10-CM

## 2021-11-28 DIAGNOSIS — M5416 Radiculopathy, lumbar region: Secondary | ICD-10-CM | POA: Diagnosis not present

## 2021-11-28 DIAGNOSIS — M5136 Other intervertebral disc degeneration, lumbar region: Secondary | ICD-10-CM | POA: Insufficient documentation

## 2021-11-28 DIAGNOSIS — M51369 Other intervertebral disc degeneration, lumbar region without mention of lumbar back pain or lower extremity pain: Secondary | ICD-10-CM | POA: Insufficient documentation

## 2021-11-28 MED ORDER — TRIAMCINOLONE ACETONIDE 40 MG/ML IJ SUSP
40.0000 mg | Freq: Once | INTRAMUSCULAR | Status: AC
  Administered 2021-11-28: 40 mg via INTRAMUSCULAR

## 2021-11-28 NOTE — Addendum Note (Signed)
Addended by: Tarri Glenn A on: 11/28/2021 04:39 PM   Modules accepted: Orders

## 2021-11-28 NOTE — Assessment & Plan Note (Signed)
Continues to have axial back pain with radiation down the right leg, right lower lateral thigh, to the second through fifth toes. At this point she has failed greater than 6 weeks of conservative treatment she has x-rays that show multilevel lumbar spondylosis, proceeding with MRI for epidural planning.

## 2021-11-28 NOTE — Progress Notes (Signed)
    Procedures performed today:    Procedure: Real-time Ultrasound Guided injection of the right plantar fascia origin Device: Samsung HS60  Verbal informed consent obtained.  Time-out conducted.  Noted no overlying erythema, induration, or other signs of local infection.  Skin prepped in a sterile fashion.  Local anesthesia: Topical Ethyl chloride.  With sterile technique and under real time ultrasound guidance: Noted thickened plantar fascia, 1 cc Kenalog 40, 1 cc lidocaine, 1 cc bupivacaine injected easily Completed without difficulty  Advised to call if fevers/chills, erythema, induration, drainage, or persistent bleeding.  Images permanently stored and available for review in PACS.  Impression: Technically successful ultrasound guided injection.  Independent interpretation of notes and tests performed by another provider:   None.  Brief History, Exam, Impression, and Recommendations:    Plantar fasciitis, bilateral Pleasant 61 year old female returns, recurrence of Planter fasciitis last injected in May of this year right side, repeat injection today due to recurrence of pain, return as needed.  Right lumbar radiculitis Continues to have axial back pain with radiation down the right leg, right lower lateral thigh, to the second through fifth toes. At this point she has failed greater than 6 weeks of conservative treatment she has x-rays that show multilevel lumbar spondylosis, proceeding with MRI for epidural planning.  Chronic process with exacerbation and pharmacologic intervention  ____________________________________________ Gwen Her. Dianah Field, M.D., ABFM., CAQSM., AME. Primary Care and Sports Medicine [facility]  Adjunct Professor of [employer] [employer]  Risk manager

## 2021-11-28 NOTE — Assessment & Plan Note (Signed)
Pleasant 62 year old female returns, recurrence of Planter fasciitis last injected in May of this year right side, repeat injection today due to recurrence of pain, return as needed.

## 2021-12-01 ENCOUNTER — Ambulatory Visit: Payer: 59 | Admitting: Family Medicine

## 2021-12-08 ENCOUNTER — Ambulatory Visit: Payer: 59 | Admitting: Family Medicine

## 2021-12-08 ENCOUNTER — Encounter: Payer: Self-pay | Admitting: Sports Medicine

## 2021-12-09 DIAGNOSIS — M47813 Spondylosis without myelopathy or radiculopathy, cervicothoracic region: Secondary | ICD-10-CM | POA: Diagnosis not present

## 2021-12-09 DIAGNOSIS — M62830 Muscle spasm of back: Secondary | ICD-10-CM | POA: Diagnosis not present

## 2021-12-09 DIAGNOSIS — M542 Cervicalgia: Secondary | ICD-10-CM | POA: Diagnosis not present

## 2021-12-09 DIAGNOSIS — M47816 Spondylosis without myelopathy or radiculopathy, lumbar region: Secondary | ICD-10-CM | POA: Diagnosis not present

## 2021-12-13 ENCOUNTER — Other Ambulatory Visit: Payer: 59

## 2021-12-22 ENCOUNTER — Telehealth: Payer: 59 | Admitting: Physician Assistant

## 2021-12-22 DIAGNOSIS — J019 Acute sinusitis, unspecified: Secondary | ICD-10-CM

## 2021-12-22 DIAGNOSIS — B9689 Other specified bacterial agents as the cause of diseases classified elsewhere: Secondary | ICD-10-CM

## 2021-12-22 MED ORDER — AZITHROMYCIN 250 MG PO TABS: ORAL_TABLET | ORAL | 0 refills | Status: AC

## 2021-12-22 NOTE — Progress Notes (Signed)

## 2021-12-27 ENCOUNTER — Other Ambulatory Visit: Payer: 59

## 2021-12-29 ENCOUNTER — Ambulatory Visit: Payer: 59 | Admitting: Family Medicine

## 2022-01-01 ENCOUNTER — Encounter: Payer: Self-pay | Admitting: Family Medicine

## 2022-01-01 ENCOUNTER — Other Ambulatory Visit (HOSPITAL_BASED_OUTPATIENT_CLINIC_OR_DEPARTMENT_OTHER): Payer: Self-pay

## 2022-01-01 ENCOUNTER — Ambulatory Visit: Payer: 59 | Admitting: Family Medicine

## 2022-01-01 VITALS — BP 148/72 | HR 75 | Ht 59.0 in | Wt 159.0 lb

## 2022-01-01 DIAGNOSIS — E1169 Type 2 diabetes mellitus with other specified complication: Secondary | ICD-10-CM | POA: Diagnosis not present

## 2022-01-01 DIAGNOSIS — N1831 Chronic kidney disease, stage 3a: Secondary | ICD-10-CM | POA: Diagnosis not present

## 2022-01-01 LAB — POCT GLYCOSYLATED HEMOGLOBIN (HGB A1C): Hemoglobin A1C: 8.1 % — AB (ref 4.0–5.6)

## 2022-01-01 MED ORDER — FLUTICASONE PROPIONATE 50 MCG/ACT NA SUSP
2.0000 | Freq: Every day | NASAL | 2 refills | Status: DC
  Filled 2022-01-01: qty 16, 30d supply, fill #0
  Filled 2022-02-03 (×2): qty 16, 30d supply, fill #1
  Filled 2022-03-30: qty 16, 30d supply, fill #2

## 2022-01-01 MED ORDER — METFORMIN HCL 1000 MG PO TABS
1000.0000 mg | ORAL_TABLET | Freq: Two times a day (BID) | ORAL | 1 refills | Status: DC
  Filled 2022-01-01 – 2022-02-24 (×2): qty 180, 90d supply, fill #0
  Filled 2022-08-31: qty 180, 90d supply, fill #1

## 2022-01-01 NOTE — Progress Notes (Signed)
Established Patient Office Visit  Subjective   Patient ID: Maria Nunez, female    DOB: 1960/12/24  Age: 61 y.o. MRN: [id number]  Chief Complaint  Patient presents with   Diabetes    Diabetes Pertinent negatives for diabetes include no chest pain.    Patient is 61 year old female presenting to clinic for diabetes follow up. She has not been checking her sugars regularly at home. She has been taking all medications. No hypoglycemic events. No open sores or wounds. She has not been as active this month due to being sick. She has been eating a lot more sweets too. She has lost about 8 pounds this month.   She is getting over being sick. She had COVID at the beginning of November, and then sinusitis shortly after. She had a virtual visit 12/22/2021 and was given azithromycin. She has completed the antibiotic. She is still having some congestion. She has tried OTC nasal sprays but stopped using them because they were causing her nose to burn.  She is following up with Nephrologist for CKD3. Next appointment is January 2024.  Review of Systems  HENT:  Positive for congestion.   Respiratory:  Negative for cough, sputum production and shortness of breath.   Cardiovascular:  Negative for chest pain and palpitations.       Objective:     BP (!) 148/72   Pulse 75   Ht '4\' 11"'$  (1.499 m)   Wt 72.1 kg   LMP 01/27/2000 (Approximate) Comment: supracervical hysterectomy  SpO2 99%   BMI 32.11 kg/m    Physical Exam HENT:     Head: Normocephalic.     Right Ear: Tympanic membrane normal.     Left Ear: Tympanic membrane normal.     Nose: Nose normal.     Mouth/Throat:     Mouth: Mucous membranes are moist.     Pharynx: Oropharynx is clear.  Eyes:     Conjunctiva/sclera: Conjunctivae normal.  Cardiovascular:     Rate and Rhythm: Normal rate and regular rhythm.  Pulmonary:     Effort: Pulmonary effort is normal.     Breath sounds: Normal breath sounds.  Neurological:      General: No focal deficit present.     Mental Status: She is alert.       Results for orders placed or performed in visit on 01/01/22  POCT glycosylated hemoglobin (Hb A1C)  Result Value Ref Range   Hemoglobin A1C 8.1 (A) 4.0 - 5.6 %   HbA1c POC (<> result, manual entry)     HbA1c, POC (prediabetic range)     HbA1c, POC (controlled diabetic range)        The ASCVD Risk score (Arnett DK, et al., 2019) failed to calculate for the following reasons:   The valid total cholesterol range is 130 to 320 mg/dL    Assessment & Plan:   Problem List Items Addressed This Visit       Endocrine   Type 2 diabetes mellitus with other specified complication ([place]) - Primary   Relevant Medications   metFORMIN (GLUCOPHAGE) 1000 MG tablet   Other Relevant Orders   POCT glycosylated hemoglobin (Hb A1C) (Completed)     Genitourinary   CKD (chronic kidney disease) stage 3, GFR 30-59 ml/min (HCC)   Congestion Prescribed Flonase as needed for congestion.  CKD Stage 3 Currently being managed by nephrology. Next appointment is January 2024.  Diabetes Unfortunately, A1C did increase to 8.1%. We will increase her  metformin to taking twice daily. If A1C still remains elevated, we will need to discuss adding another medication to current regimen. Hopefully, she starts feeling better for being sick and able to get back into more physical activity. Encouraged limiting sugar and carbohydrate intake.  Follow up in 3 months to recheck A1C.    Return in about 3 months (around 04/02/2022) for Diabetes follow-up.    Dorian Heckle, Student-PA

## 2022-01-01 NOTE — Progress Notes (Signed)
I interviewed and examined the patient personally.  I agree with the assessment and plan.    She does have a follow-up appointment with a nephrologist on January 9.  Due to some additional lab work a couple weeks prior.

## 2022-01-10 ENCOUNTER — Ambulatory Visit (INDEPENDENT_AMBULATORY_CARE_PROVIDER_SITE_OTHER): Payer: 59

## 2022-01-10 DIAGNOSIS — M5416 Radiculopathy, lumbar region: Secondary | ICD-10-CM | POA: Diagnosis not present

## 2022-01-10 DIAGNOSIS — M5116 Intervertebral disc disorders with radiculopathy, lumbar region: Secondary | ICD-10-CM | POA: Diagnosis not present

## 2022-01-20 DIAGNOSIS — N1832 Chronic kidney disease, stage 3b: Secondary | ICD-10-CM | POA: Diagnosis not present

## 2022-02-03 ENCOUNTER — Other Ambulatory Visit (HOSPITAL_BASED_OUTPATIENT_CLINIC_OR_DEPARTMENT_OTHER): Payer: Self-pay

## 2022-02-03 ENCOUNTER — Other Ambulatory Visit (HOSPITAL_COMMUNITY): Payer: Self-pay

## 2022-02-03 DIAGNOSIS — E1122 Type 2 diabetes mellitus with diabetic chronic kidney disease: Secondary | ICD-10-CM | POA: Diagnosis not present

## 2022-02-03 DIAGNOSIS — N1831 Chronic kidney disease, stage 3a: Secondary | ICD-10-CM | POA: Diagnosis not present

## 2022-02-03 DIAGNOSIS — I129 Hypertensive chronic kidney disease with stage 1 through stage 4 chronic kidney disease, or unspecified chronic kidney disease: Secondary | ICD-10-CM | POA: Diagnosis not present

## 2022-02-03 DIAGNOSIS — R809 Proteinuria, unspecified: Secondary | ICD-10-CM | POA: Diagnosis not present

## 2022-02-04 DIAGNOSIS — M47816 Spondylosis without myelopathy or radiculopathy, lumbar region: Secondary | ICD-10-CM | POA: Diagnosis not present

## 2022-02-04 DIAGNOSIS — M47813 Spondylosis without myelopathy or radiculopathy, cervicothoracic region: Secondary | ICD-10-CM | POA: Diagnosis not present

## 2022-02-04 DIAGNOSIS — M62830 Muscle spasm of back: Secondary | ICD-10-CM | POA: Diagnosis not present

## 2022-02-04 DIAGNOSIS — M542 Cervicalgia: Secondary | ICD-10-CM | POA: Diagnosis not present

## 2022-02-16 DIAGNOSIS — G4733 Obstructive sleep apnea (adult) (pediatric): Secondary | ICD-10-CM | POA: Diagnosis not present

## 2022-02-25 ENCOUNTER — Other Ambulatory Visit (HOSPITAL_BASED_OUTPATIENT_CLINIC_OR_DEPARTMENT_OTHER): Payer: Self-pay

## 2022-02-25 ENCOUNTER — Other Ambulatory Visit: Payer: Self-pay

## 2022-03-04 ENCOUNTER — Other Ambulatory Visit (HOSPITAL_BASED_OUTPATIENT_CLINIC_OR_DEPARTMENT_OTHER): Payer: Self-pay

## 2022-03-05 DIAGNOSIS — M47816 Spondylosis without myelopathy or radiculopathy, lumbar region: Secondary | ICD-10-CM | POA: Diagnosis not present

## 2022-03-05 DIAGNOSIS — M542 Cervicalgia: Secondary | ICD-10-CM | POA: Diagnosis not present

## 2022-03-05 DIAGNOSIS — M62830 Muscle spasm of back: Secondary | ICD-10-CM | POA: Diagnosis not present

## 2022-03-05 DIAGNOSIS — M47813 Spondylosis without myelopathy or radiculopathy, cervicothoracic region: Secondary | ICD-10-CM | POA: Diagnosis not present

## 2022-03-19 DIAGNOSIS — G4733 Obstructive sleep apnea (adult) (pediatric): Secondary | ICD-10-CM | POA: Diagnosis not present

## 2022-03-20 ENCOUNTER — Encounter: Payer: Self-pay | Admitting: Podiatry

## 2022-03-20 ENCOUNTER — Ambulatory Visit: Payer: Commercial Managed Care - PPO | Admitting: Podiatry

## 2022-03-20 DIAGNOSIS — L603 Nail dystrophy: Secondary | ICD-10-CM | POA: Diagnosis not present

## 2022-03-20 NOTE — Progress Notes (Signed)
Subjective:  Patient ID: Maria Nunez, female    DOB: 25-Dec-1960,   MRN: [id number]  Chief Complaint  Patient presents with   Nail Problem     Left great toe nail , discoloration.Patient states this has been on going since November 2023. No constant pain but she is concerned since she is a diabetic     62 y.o. female presents for concern of left great toenail discoloration. Relates she noticed this back in November. Denies any pain but has concerns as she is diabetic. Her last A1c was 8.1. Here for diabetic foot exam as well.  . Denies any other pedal complaints. Denies n/v/f/c.   Past Medical History:  Diagnosis Date   Abnormal ultrasound of ovary 2016   Ultrasound showing bilateral ovaries in contact with one another.  Asymptomatic.   Allergy    multiple med allergies   Anemia 04/29/2020   Arthritis    left knee   Bursitis 01/24/2016   left shoulder   Chest pain 01/29/2021   See 01/29/21 ED note in Epic. Pain significantly improved w/ NTG. CBC, CMP, troponin, BNP unremarkable. EKG showed NSR w/o acute ST changes. 02/19/21 [facility] Stress test - normal.   Chest pain 01/13/2019   Troponin negative x 2, Cxray normal, EKG sinus rhythm w/ borderline T abnormality. Discharged from ER w/ instruction to f/u w/PCP.   Chronic back pain    Chronic kidney disease    Stage 3, follows w/PCP   Complication of anesthesia    Pt states that one time she was told that she was given general anesthesia too fast. She woke up covered in blotches and was given IV Benadryl per pt.   COVID-19 03/05/2021   congestion, fever, all symptoms resolved as of 05/16/21   Diabetes mellitus    Type 2   Fatty liver    Fibroid    hysteretomy in 2002   GERD (gastroesophageal reflux disease)    Patient is taking Pepcid as of 05/16/21.   Heart murmur    very long time ago per pt on 05/16/21   Hidradenitis    years ago per pt on 05/16/21   High cholesterol    follows w/ PCP Dr. Beatrice Lecher    Hypertension    follows with PCP Dr. Beatrice Lecher   Plantar fasciitis, bilateral 09/24/2020   Sleep apnea    wears CPAP   Spondylosis 2023   lumbar, cervicothoracic region with cervicalgia   Wears contact lenses    Wears glasses     Objective:  Physical Exam: Vascular: DP/PT pulses 2/4 bilateral. CFT <3 seconds. Normal hair growth on digits. No edema.  Skin. No lacerations or abrasions bilateral feet.  Right hallux nail with some dark discoloration noted at the central lateral aspect of the nail plate consistent with underlying hemorrhage Musculoskeletal: MMT 5/5 bilateral lower extremities in DF, PF, Inversion and Eversion. Deceased ROM in DF of ankle joint.  Neurological: Sensation intact to light touch. Protective sensation intact.   Assessment:   1. Onychodystrophy      Plan:  Patient was evaluated and treated and all questions answered. -Discussed and educated patient on diabetic foot care, especially with  regards to the vascular, neurological and musculoskeletal systems.  -Stressed the importance of good glycemic control and the detriment of not  controlling glucose levels in relation to the foot. -Discussed supportive shoes at all times and checking feet regularly.  -Mechanically debrided right hallux nail back to healy nail bed  without incident as courtesy.  -Answered all patient questions -Patient to return  in 1 year for diabetic foot check.  -Patient advised to call the office if any problems or questions arise in the meantime.   Lorenda Peck, DPM

## 2022-03-30 ENCOUNTER — Other Ambulatory Visit (HOSPITAL_BASED_OUTPATIENT_CLINIC_OR_DEPARTMENT_OTHER): Payer: Self-pay

## 2022-04-01 ENCOUNTER — Ambulatory Visit: Payer: Commercial Managed Care - PPO | Admitting: Family Medicine

## 2022-04-01 ENCOUNTER — Other Ambulatory Visit (HOSPITAL_BASED_OUTPATIENT_CLINIC_OR_DEPARTMENT_OTHER): Payer: Self-pay

## 2022-04-01 ENCOUNTER — Telehealth: Payer: Self-pay | Admitting: Family Medicine

## 2022-04-01 ENCOUNTER — Encounter: Payer: Self-pay | Admitting: Family Medicine

## 2022-04-01 VITALS — BP 141/57 | HR 68 | Ht 59.0 in | Wt 162.0 lb

## 2022-04-01 DIAGNOSIS — I10 Essential (primary) hypertension: Secondary | ICD-10-CM

## 2022-04-01 DIAGNOSIS — M25562 Pain in left knee: Secondary | ICD-10-CM

## 2022-04-01 DIAGNOSIS — M542 Cervicalgia: Secondary | ICD-10-CM | POA: Diagnosis not present

## 2022-04-01 DIAGNOSIS — M62830 Muscle spasm of back: Secondary | ICD-10-CM | POA: Diagnosis not present

## 2022-04-01 DIAGNOSIS — M25561 Pain in right knee: Secondary | ICD-10-CM

## 2022-04-01 DIAGNOSIS — M47816 Spondylosis without myelopathy or radiculopathy, lumbar region: Secondary | ICD-10-CM | POA: Diagnosis not present

## 2022-04-01 DIAGNOSIS — G8929 Other chronic pain: Secondary | ICD-10-CM | POA: Diagnosis not present

## 2022-04-01 DIAGNOSIS — N1831 Chronic kidney disease, stage 3a: Secondary | ICD-10-CM

## 2022-04-01 DIAGNOSIS — M47813 Spondylosis without myelopathy or radiculopathy, cervicothoracic region: Secondary | ICD-10-CM | POA: Diagnosis not present

## 2022-04-01 DIAGNOSIS — E1169 Type 2 diabetes mellitus with other specified complication: Secondary | ICD-10-CM | POA: Diagnosis not present

## 2022-04-01 LAB — POCT GLYCOSYLATED HEMOGLOBIN (HGB A1C): Hemoglobin A1C: 6.9 % — AB (ref 4.0–5.6)

## 2022-04-01 LAB — HM DIABETES EYE EXAM

## 2022-04-01 MED ORDER — VALSARTAN 80 MG PO TABS
80.0000 mg | ORAL_TABLET | Freq: Every day | ORAL | 3 refills | Status: DC
  Filled 2022-04-01: qty 90, 90d supply, fill #0
  Filled 2022-06-29: qty 90, 90d supply, fill #1
  Filled 2022-09-30: qty 90, 90d supply, fill #2

## 2022-04-01 NOTE — Assessment & Plan Note (Signed)
Due to recheck renal function.

## 2022-04-01 NOTE — Assessment & Plan Note (Addendum)
Pressure is a little bit elevated today we will recheck before she goes.  Feet improved but still not normal.  Will go ahead and increase her Diovan to 80 mg.  New prescription sent to pharmacy.

## 2022-04-01 NOTE — Telephone Encounter (Signed)
Call patient and let her know that since her repeat blood pressure was still a little elevated I am going to bump up her Diovan to 80 mg.  New prescription sent to the pharmacy she can double up on what she has until she is able to get to the pharmacy.

## 2022-04-01 NOTE — Telephone Encounter (Signed)
Attempted to contact patient. Left voice mail message requesting a return call.

## 2022-04-01 NOTE — Assessment & Plan Note (Addendum)
A1c 6.9 today.  Which is fantastic much improved from 8.1 and right now she is really just on the metformin just taking it once a day.  When she was taking it twice a day she was having some hypoglycemic events.  If she has been exercising which is great just encouraged her to keep up the great work with diet and exercise.  Follow-up in 3 months.

## 2022-04-01 NOTE — Progress Notes (Signed)
Established Patient Office Visit  Subjective   Patient ID: Maria Nunez, female    DOB: Dec 03, 1960  Age: 62 y.o. MRN: [id number]  Chief Complaint  Patient presents with   Diabetes   Follow-up    HPI Diabetes - no hypoglycemic events. No wounds or sores that are not healing well. No increased thirst or urination. Checking glucose at home. Taking medications as prescribed without any side effects.  She has seen podiatry she recently had a bruised [place]  Hypertension- Pt denies chest pain, SOB, dizziness, or heart palpitations.  Taking meds as directed w/o problems.  Denies medication side effects.  She has been walking on the treadmill and just walking in general but has had a lot of knee pain she is been using Tylenol and topical creams.  Reports some clicking in both ears for couple of months.  She has not had any pain or discomfort usually will just like a couple times and then stop.    ROS    Objective:     BP (!) 141/57   Pulse 68   Ht '4\' 11"'$  (1.499 m)   Wt 162 lb (73.5 kg)   LMP 01/27/2000 (Approximate) Comment: supracervical hysterectomy  SpO2 97%   BMI 32.72 kg/m    Physical Exam Vitals and nursing note reviewed.  Constitutional:      Appearance: She is well-developed.  HENT:     Head: Normocephalic and atraumatic.  Cardiovascular:     Rate and Rhythm: Normal rate and regular rhythm.     Heart sounds: Normal heart sounds.  Pulmonary:     Effort: Pulmonary effort is normal.     Breath sounds: Normal breath sounds.  Skin:    General: Skin is warm and dry.  Neurological:     Mental Status: She is alert and oriented to person, place, and time.  Psychiatric:        Behavior: Behavior normal.      Results for orders placed or performed in visit on 04/01/22  POCT HgB A1C  Result Value Ref Range   Hemoglobin A1C 6.9 (A) 4.0 - 5.6 %   HbA1c POC (<> result, manual entry)     HbA1c, POC (prediabetic range)     HbA1c,  POC (controlled diabetic range)        The ASCVD Risk score (Arnett DK, et al., 2019) failed to calculate for the following reasons:   The valid total cholesterol range is 130 to 320 mg/dL    Assessment & Plan:   Problem List Items Addressed This Visit       Cardiovascular and Mediastinum   HYPERTENSION, BENIGN SYSTEMIC    Pressure is a little bit elevated today we will recheck before she goes.  Feet improved but still not normal.  Will go ahead and increase her Diovan to 80 mg.  New prescription sent to pharmacy.      Relevant Medications   valsartan (DIOVAN) 80 MG tablet   Other Relevant Orders   BASIC METABOLIC PANEL WITH GFR   Basic Metabolic Panel (BMET)     Endocrine   Type 2 diabetes mellitus with other specified complication ([place]) - Primary    A1c 6.9 today.  Which is fantastic much improved from 8.1 and right now she is really just on the metformin just taking it once a day.  When she was taking it twice a day she was having some hypoglycemic events.  If she has been exercising  which is great just encouraged her to keep up the great work with diet and exercise.  Follow-up in 3 months.      Relevant Medications   valsartan (DIOVAN) 80 MG tablet   Other Relevant Orders   POCT HgB A1C (Completed)   BASIC METABOLIC PANEL WITH GFR   Basic Metabolic Panel (BMET)     Genitourinary   CKD (chronic kidney disease) stage 3, GFR 30-59 ml/min (HCC)    Due to recheck renal function.      Other Visit Diagnoses     Chronic pain of both knees          Bilateral knee pain-recommend getting in with our sports med doc Dr. Dianah Field I want her to be able to continue to exercise.  Return in about 3 months (around 07/02/2022) for Diabetes follow-up.    Beatrice Lecher, MD

## 2022-04-02 ENCOUNTER — Ambulatory Visit: Payer: Self-pay | Admitting: Family Medicine

## 2022-04-02 LAB — BASIC METABOLIC PANEL
BUN/Creatinine Ratio: 27 (ref 12–28)
BUN: 29 mg/dL — ABNORMAL HIGH (ref 8–27)
CO2: 21 mmol/L (ref 20–29)
Calcium: 10.3 mg/dL (ref 8.7–10.3)
Chloride: 106 mmol/L (ref 96–106)
Creatinine, Ser: 1.07 mg/dL — ABNORMAL HIGH (ref 0.57–1.00)
Glucose: 119 mg/dL — ABNORMAL HIGH (ref 70–99)
Potassium: 4.2 mmol/L (ref 3.5–5.2)
Sodium: 140 mmol/L (ref 134–144)
eGFR: 59 mL/min/{1.73_m2} — ABNORMAL LOW (ref >59)

## 2022-04-02 NOTE — Telephone Encounter (Signed)
Patient informed and will double current prescription of diovan '40mg'$  - she states she has just picked up the full prescription of the '40mg'$  . She will double until needing to refil

## 2022-04-02 NOTE — Progress Notes (Signed)
Hi Maria Nunez, your kidney function actually looks really great!  Normally run around 1.3-1.4 and this time it was actually 1.0 which is awesome.

## 2022-04-08 ENCOUNTER — Other Ambulatory Visit (HOSPITAL_BASED_OUTPATIENT_CLINIC_OR_DEPARTMENT_OTHER): Payer: Self-pay

## 2022-04-08 ENCOUNTER — Other Ambulatory Visit: Payer: Self-pay

## 2022-04-08 ENCOUNTER — Ambulatory Visit (INDEPENDENT_AMBULATORY_CARE_PROVIDER_SITE_OTHER): Payer: Commercial Managed Care - PPO

## 2022-04-08 ENCOUNTER — Ambulatory Visit: Payer: Commercial Managed Care - PPO | Admitting: Sports Medicine

## 2022-04-08 DIAGNOSIS — M25562 Pain in left knee: Secondary | ICD-10-CM

## 2022-04-08 DIAGNOSIS — M170 Bilateral primary osteoarthritis of knee: Secondary | ICD-10-CM

## 2022-04-08 DIAGNOSIS — M5416 Radiculopathy, lumbar region: Secondary | ICD-10-CM

## 2022-04-08 DIAGNOSIS — M25561 Pain in right knee: Secondary | ICD-10-CM | POA: Diagnosis not present

## 2022-04-08 MED ORDER — CELECOXIB 100 MG PO CAPS
100.0000 mg | ORAL_CAPSULE | Freq: Every day | ORAL | 3 refills | Status: DC | PRN
  Filled 2022-04-08 (×2): qty 60, 30d supply, fill #0

## 2022-04-08 MED ORDER — MAGNESIUM OXIDE 400 MG PO TABS
800.0000 mg | ORAL_TABLET | Freq: Every day | ORAL | 3 refills | Status: DC
  Filled 2022-04-08: qty 120, 60d supply, fill #0

## 2022-04-08 NOTE — Assessment & Plan Note (Signed)
Also complaining of cramping right lower leg at night, she had similar symptoms at the end of last year when we were managing her radiculopathy. The lumbar spine right did show lumbar spondylosis, but her symptoms were not bad enough to consider intervention. For now we will start magnesium oxide at night. If persistence of discomfort and statin usage we can add CK levels with her renal function at the follow-up visit.

## 2022-04-08 NOTE — Assessment & Plan Note (Signed)
This is a very pleasant 62 year old female, she has a history of knee osteoarthritis last injected left side back in 2019. She did well, now she is having recurrence of pain bilaterally, particularly anteriorly on the right knee. She tried some Tylenol and topical Voltaren without significant efficacy. She does have a history of chronic renal insufficiency with normal renal function recently. We will start a low-dose Celebrex, x-rays, home physical therapy, I would like to look at her GFR again in about a month or 6 weeks.

## 2022-04-08 NOTE — Progress Notes (Signed)
    Procedures performed today:    None.  Independent interpretation of notes and tests performed by another provider:   None.  Brief History, Exam, Impression, and Recommendations:    Primary osteoarthritis of both knees This is a very pleasant 62 year old female, she has a history of knee osteoarthritis last injected left side back in 2019. She did well, now she is having recurrence of pain bilaterally, particularly anteriorly on the right knee. She tried some Tylenol and topical Voltaren without significant efficacy. She does have a history of chronic renal insufficiency with normal renal function recently. We will start a low-dose Celebrex, x-rays, home physical therapy, I would like to look at her GFR again in about a month or 6 weeks.   Right lumbar radiculitis Also complaining of cramping right lower leg at night, she had similar symptoms at the end of last year when we were managing her radiculopathy. The lumbar spine right did show lumbar spondylosis, but her symptoms were not bad enough to consider intervention. For now we will start magnesium oxide at night. If persistence of discomfort and statin usage we can add CK levels with her renal function at the follow-up visit.    ____________________________________________ Gwen Her. Dianah Field, M.D., ABFM., CAQSM., AME. Primary Care and Sports Medicine [facility]  Adjunct Professor of [place] of [facility] [employer]  Risk manager

## 2022-04-09 ENCOUNTER — Encounter: Payer: Self-pay | Admitting: Sports Medicine

## 2022-04-17 DIAGNOSIS — G4733 Obstructive sleep apnea (adult) (pediatric): Secondary | ICD-10-CM | POA: Diagnosis not present

## 2022-04-20 NOTE — Progress Notes (Unsigned)
62 y.o. [id number] Divorced [name] here for annual exam.    No vaginal bleeding or discharge.  Using vaginal estrogen cream 1/2 gram three times a week, started a couple of months ago to facilitate her pelvic exam and pap collection today.  Her daughter is moving back to [place].   PCP:   Nani Gasseratherine Metheney, MD  Patient's last menstrual period was 01/27/2000 (approximate).           Sexually active: No.  The current method of family planning is status post hysterectomy.    Exercising: Yes.     Walking the dogs, treadmills Smoker:  no  Health Maintenance: Pap:  04/11/21 non-diagnostic, 08/19/20 neg: HR HPV positive History of abnormal Pap:  yes, leep procedure 04/30/21 - LGSIL, ECC scant cervical stroma. MMG:  10/15/21 Breast Density Category B, BI-RADS CAT 1 neg Colonoscopy:  2015 - due in 10 years.  BMD:   n/a  Result  n/a TDaP:  04/03/14 Gardasil:   no HIV: n/a Hep C: n/a Screening Labs:  PCP   reports that she has never smoked. She has never used smokeless tobacco. She reports that she does not drink alcohol and does not use drugs.  Past Medical History:  Diagnosis Date   Abnormal ultrasound of ovary 2016   Ultrasound showing bilateral ovaries in contact with one another.  Asymptomatic.   Allergy    multiple med allergies   Anemia 04/29/2020   Arthritis    left knee   Bursitis 01/24/2016   left shoulder   Chest pain 01/29/2021   See 01/29/21 ED note in Epic. Pain significantly improved w/ NTG. CBC, CMP, troponin, BNP unremarkable. EKG showed NSR w/o acute ST changes. 02/19/21 [facility] Stress test - normal.   Chest pain 01/13/2019   Troponin negative x 2, Cxray normal, EKG sinus rhythm w/ borderline T abnormality. Discharged from ER w/ instruction to f/u w/PCP.   Chronic back pain    Chronic kidney disease    Stage 3, follows w/PCP   Complication of anesthesia    Pt states that one time she was told that she was given general anesthesia too fast. She  woke up covered in blotches and was given IV Benadryl per pt.   COVID-19 03/05/2021   congestion, fever, all symptoms resolved as of 05/16/21   Diabetes mellitus    Type 2   Fatty liver    Fibroid    hysteretomy in 2002   GERD (gastroesophageal reflux disease)    Patient is taking Pepcid as of 05/16/21.   Heart murmur    very long time ago per pt on 05/16/21   Hidradenitis    years ago per pt on 05/16/21   High cholesterol    follows w/ PCP Dr. Nani Gasseratherine Metheney   Hypertension    follows with PCP Dr. Nani Gasseratherine Metheney   Plantar fasciitis, bilateral 09/24/2020   Sleep apnea    wears CPAP   Spondylosis 2023   lumbar, cervicothoracic region with cervicalgia   Wears contact lenses    Wears glasses     Past Surgical History:  Procedure Laterality Date   BREAST EXCISIONAL BIOPSY Right 2020   BREAST SURGERY     cysts removed right breast, around 20 years ago per pt on 05/16/21   BREAST SURGERY  08/2018   benign mass   CARPAL TUNNEL RELEASE Right 01/08/2017   Procedure: CARPAL TUNNEL RELEASE, POSSIBLE RIGHT THUMB CARPOMETACARPAL INJECTION;  Surgeon: Dominica SeverinGramig, William, MD;  Location: [facility];  Service: Orthopedics;  Laterality: Right;  60 MINS   CESAREAN SECTION     1992 & 1994   COLPOSCOPY N/A 05/19/2021   Procedure: COLPOSCOPY;  Surgeon: Patton Salles, MD;  Location: [facility];  Service: Gynecology;  Laterality: N/A;   ESOPHAGOGASTRODUODENOSCOPY  07/19/2020   in Epic   INGUINAL HIDRADENITIS EXCISION     many years ago per pt on 05/16/21   LEEP  05/04/2013   HPV change   LEEP N/A 05/19/2021   Procedure: LOOP ELECTROSURGICAL EXCISION PROCEDURE (LEEP), ENDOCERVICAL CURETTAGE;  Surgeon: Patton Salles, MD;  Location: [facility];  Service: Gynecology;  Laterality: N/A;   SUPRACERVICAL ABDOMINAL HYSTERECTOMY  2002   Done for bleeding, fibroids in [place]    Current Outpatient Medications  Medication Sig Dispense  Refill   AMBULATORY NON FORMULARY MEDICATION Set CPAP to 10 cm water pressure. Need download in 2 weeks after change of setting. 1 each 0   Ascorbic Acid (VITAMIN C) 1000 MG tablet Take 1 tablet by mouth daily.     Blood Glucose Monitoring Suppl (FREESTYLE LITE) DEVI      celecoxib (CELEBREX) 100 MG capsule Take 1-2 capsules (100-200 mg total) by mouth daily as needed. For pain 60 capsule 3   Cyanocobalamin (B-12) 500 MCG TABS Take 1 tablet by mouth daily.     estradiol (ESTRACE) 0.1 MG/GM vaginal cream Insert 1/2 gram vaginally three times a week. 42.5 g 1   famotidine (PEPCID) 40 MG tablet Take 1 tablet by mouth two times daily. 180 tablet 1   fluticasone (FLONASE) 50 MCG/ACT nasal spray Place 2 sprays into both nostrils daily. 16 g 2   FOLIC ACID PO Take 800 mcg by mouth daily.     Lancets (FREESTYLE) lancets For use when testing blood sugars daily. DX:E11.69 200 each 12   magnesium oxide (MAG-OX) 400 MG tablet Take 2 tablets (800 mg total) by mouth at bedtime. 180 tablet 3   melatonin 1 MG TABS tablet Take 1 mg by mouth at bedtime.     metFORMIN (GLUCOPHAGE) 1000 MG tablet Take 1 tablet (1,000 mg total) by mouth 2 (two) times daily with a meal. 180 tablet 1   pyridOXINE (VITAMIN B-6) 100 MG tablet Take 100 mg by mouth daily.     rosuvastatin (CRESTOR) 40 MG tablet TAKE 1 TABLET (40 MG TOTAL) BY MOUTH DAILY. 90 tablet 3   valsartan (DIOVAN) 80 MG tablet Take 1 tablet (80 mg total) by mouth daily. 90 tablet 3   Zinc Sulfate (ZINC 15 PO) Take by mouth.     glucose blood (FREESTYLE LITE) test strip USE TO TEST BLOOD SUGARS TWICE DAILY 400 strip 12   No current facility-administered medications for this visit.    Family History  Problem Relation Age of Onset   Asthma Mother    COPD Mother    Diabetes Mother    Hypertension Mother    Diabetes Father    Hypertension Father    Breast cancer Maternal Aunt    Breast cancer Paternal Aunt    Colon cancer Neg Hx    Pancreatic cancer Neg Hx     Esophageal cancer Neg Hx    Stomach cancer Neg Hx    Liver disease Neg Hx     Review of Systems  All other systems reviewed and are negative.   Exam:   BP 128/84 (BP Location: Left Arm, Patient Position: Sitting, Cuff Size:  Normal)   Pulse 72   Ht 4' 11.5" (1.511 m)   Wt 161 lb (73 kg)   LMP 01/27/2000 (Approximate) Comment: supracervical hysterectomy  SpO2 99%   BMI 31.97 kg/m     General appearance: alert, cooperative and appears stated age Head: normocephalic, without obvious abnormality, atraumatic Neck: no adenopathy, supple, symmetrical, trachea midline and thyroid normal to inspection and palpation Lungs: clear to auscultation bilaterally Breasts: normal appearance, no masses or tenderness, No nipple retraction or dimpling, No nipple discharge or bleeding, No axillary adenopathy Heart: regular rate and rhythm Abdomen: soft, non-tender; no masses, no organomegaly Extremities: extremities normal, atraumatic, no cyanosis or edema Skin: skin color, texture, turgor normal. No rashes or lesions Lymph nodes: cervical, supraclavicular, and axillary nodes normal. Neurologic: grossly normal  Pelvic: External genitalia:  no lesions              No abnormal inguinal nodes palpated.              Urethra:  normal appearing urethra with no masses, tenderness or lesions              Bartholins and Skenes: normal                 Vagina: normal appearing vagina with normal color and discharge, no lesions              Cervix: no lesions.  Flush with vaginal cuff.               Pap taken: yes Bimanual Exam:  Uterus:  absent              Adnexa: no mass, fullness, tenderness              Rectal exam: yes.  Confirms.              Anus:  normal sphincter tone, no lesions  Chaperone was present for exam:  Warren Lacymily F, CMA  Assessment:   Well woman visit with gynecologic exam. Status post supracervical abdominal hysterectomy.   Ovaries remain.  Hx LGSIL pap.  LEEP showing HPV effect in  2015.  LEEP showing LGSIL 2023.  Pap in 2022 showing positive HR HPV.  Cervical stenosis.   Status post right breast biopsies.  Right breast pain following biopsy 09/16/18.  Vaginal atrophy.  Menopausal symptoms.  Abnormal ovaries on ultrasound.  Touching one another. DM. Hx vulvar abscess.  Plan: Mammogram screening discussed. Self breast awareness reviewed. Pap and HR HPV collected. Guidelines for Calcium, Vitamin D, regular exercise program including cardiovascular and weight bearing exercise. Refill of vaginal estrogen cream.  Follow up annually and prn.   After visit summary provided.

## 2022-04-28 DIAGNOSIS — M47816 Spondylosis without myelopathy or radiculopathy, lumbar region: Secondary | ICD-10-CM | POA: Diagnosis not present

## 2022-04-28 DIAGNOSIS — M62830 Muscle spasm of back: Secondary | ICD-10-CM | POA: Diagnosis not present

## 2022-04-28 DIAGNOSIS — M47813 Spondylosis without myelopathy or radiculopathy, cervicothoracic region: Secondary | ICD-10-CM | POA: Diagnosis not present

## 2022-04-28 DIAGNOSIS — M542 Cervicalgia: Secondary | ICD-10-CM | POA: Diagnosis not present

## 2022-05-04 ENCOUNTER — Other Ambulatory Visit (HOSPITAL_BASED_OUTPATIENT_CLINIC_OR_DEPARTMENT_OTHER): Payer: Self-pay

## 2022-05-04 ENCOUNTER — Ambulatory Visit (INDEPENDENT_AMBULATORY_CARE_PROVIDER_SITE_OTHER): Payer: Commercial Managed Care - PPO | Admitting: Obstetrics and Gynecology

## 2022-05-04 ENCOUNTER — Encounter: Payer: Self-pay | Admitting: Obstetrics and Gynecology

## 2022-05-04 ENCOUNTER — Other Ambulatory Visit (HOSPITAL_COMMUNITY)
Admission: RE | Admit: 2022-05-04 | Discharge: 2022-05-04 | Disposition: A | Discharge status: Home / Self Care | Payer: Commercial Managed Care - PPO | Source: Ambulatory Visit | Attending: Obstetrics and Gynecology | Admitting: Obstetrics and Gynecology

## 2022-05-04 VITALS — BP 128/84 | HR 72 | Ht 59.5 in | Wt 161.0 lb

## 2022-05-04 DIAGNOSIS — N870 Mild cervical dysplasia: Secondary | ICD-10-CM

## 2022-05-04 DIAGNOSIS — Z124 Encounter for screening for malignant neoplasm of cervix: Secondary | ICD-10-CM | POA: Diagnosis not present

## 2022-05-04 DIAGNOSIS — Z01419 Encounter for gynecological examination (general) (routine) without abnormal findings: Secondary | ICD-10-CM

## 2022-05-04 MED ORDER — ESTRADIOL 0.1 MG/GM VA CREA
TOPICAL_CREAM | VAGINAL | 1 refills | Status: DC
  Filled 2022-05-04: qty 42.5, 30d supply, fill #0
  Filled 2022-06-29: qty 42.5, 90d supply, fill #0

## 2022-05-04 NOTE — Patient Instructions (Signed)

## 2022-05-08 LAB — CYTOLOGY - PAP
Comment: NEGATIVE
Diagnosis: NEGATIVE
Diagnosis: REACTIVE
High risk HPV: NEGATIVE

## 2022-05-11 ENCOUNTER — Encounter: Payer: Self-pay | Admitting: *Deleted

## 2022-05-13 ENCOUNTER — Other Ambulatory Visit (HOSPITAL_BASED_OUTPATIENT_CLINIC_OR_DEPARTMENT_OTHER): Payer: Self-pay

## 2022-05-20 ENCOUNTER — Ambulatory Visit: Payer: Commercial Managed Care - PPO | Admitting: Sports Medicine

## 2022-05-20 DIAGNOSIS — M722 Plantar fascial fibromatosis: Secondary | ICD-10-CM

## 2022-05-20 DIAGNOSIS — M170 Bilateral primary osteoarthritis of knee: Secondary | ICD-10-CM | POA: Diagnosis not present

## 2022-05-20 NOTE — Assessment & Plan Note (Signed)
Overall doing well from a plantar fasciitis standpoint, her last injection was in November 2023. Right side. More recently she did some work in the yard, and had some increasing pain under her arch, I did advise her this was more of an arch strain rather than plantar fasciitis, it is heading in the right direction, she does have arch straps, return as needed for this.

## 2022-05-20 NOTE — Progress Notes (Signed)
    Procedures performed today:    None.  Independent interpretation of notes and tests performed by another provider:   None.  Brief History, Exam, Impression, and Recommendations:    Primary osteoarthritis of both knees Overall doing okay, she did stop her Celebrex, she can restart if she has increasing knee pain. Last knee injection was left-sided 2019. If she does restart her Celebrex I would like to recheck her GFR after a month of use.  Plantar fasciitis, bilateral Overall doing well from a plantar fasciitis standpoint, her last injection was in November 2023. Right side. More recently she did some work in the yard, and had some increasing pain under her arch, I did advise her this was more of an arch strain rather than plantar fasciitis, it is heading in the right direction, she does have arch straps, return as needed for this.    ____________________________________________ Ihor Austin. Benjamin Stain, M.D., ABFM., CAQSM., AME. Primary Care and Sports Medicine [facility]  Adjunct Professor of Family Medicine  [employer] [employer] [facility] [employer]  Restaurant manager, fast food

## 2022-05-20 NOTE — Assessment & Plan Note (Signed)
Overall doing okay, she did stop her Celebrex, she can restart if she has increasing knee pain. Last knee injection was left-sided 2019. If she does restart her Celebrex I would like to recheck her GFR after a month of use.

## 2022-05-25 DIAGNOSIS — M542 Cervicalgia: Secondary | ICD-10-CM | POA: Diagnosis not present

## 2022-05-25 DIAGNOSIS — M47816 Spondylosis without myelopathy or radiculopathy, lumbar region: Secondary | ICD-10-CM | POA: Diagnosis not present

## 2022-05-25 DIAGNOSIS — M47813 Spondylosis without myelopathy or radiculopathy, cervicothoracic region: Secondary | ICD-10-CM | POA: Diagnosis not present

## 2022-05-25 DIAGNOSIS — M62830 Muscle spasm of back: Secondary | ICD-10-CM | POA: Diagnosis not present

## 2022-06-07 ENCOUNTER — Ambulatory Visit (INDEPENDENT_AMBULATORY_CARE_PROVIDER_SITE_OTHER): Payer: Commercial Managed Care - PPO

## 2022-06-07 ENCOUNTER — Ambulatory Visit
Admission: RE | Admit: 2022-06-07 | Discharge: 2022-06-07 | Disposition: A | Discharge status: Home / Self Care | Payer: Commercial Managed Care - PPO | Source: Ambulatory Visit | Attending: Physician Assistant | Admitting: Physician Assistant

## 2022-06-07 VITALS — BP 130/76 | HR 85 | Temp 98.6°F | Resp 18 | Ht 59.0 in | Wt 155.0 lb

## 2022-06-07 DIAGNOSIS — J069 Acute upper respiratory infection, unspecified: Secondary | ICD-10-CM | POA: Diagnosis not present

## 2022-06-07 DIAGNOSIS — R0981 Nasal congestion: Secondary | ICD-10-CM | POA: Diagnosis not present

## 2022-06-07 DIAGNOSIS — R059 Cough, unspecified: Secondary | ICD-10-CM

## 2022-06-07 DIAGNOSIS — R051 Acute cough: Secondary | ICD-10-CM | POA: Diagnosis not present

## 2022-06-07 DIAGNOSIS — M546 Pain in thoracic spine: Secondary | ICD-10-CM

## 2022-06-07 LAB — POCT URINALYSIS DIP (MANUAL ENTRY)
Bilirubin, UA: NEGATIVE
Glucose, UA: NEGATIVE mg/dL
Ketones, POC UA: NEGATIVE mg/dL
Leukocytes, UA: NEGATIVE
Nitrite, UA: NEGATIVE
Protein Ur, POC: 100 mg/dL — AB
Spec Grav, UA: >=1.03 — AB (ref 1.010–1.025)
Urobilinogen, UA: 0.2 E.U./dL
pH, UA: 5.5 (ref 5.0–8.0)

## 2022-06-07 MED ORDER — LORATADINE 10 MG PO TABS: 10.0000 mg | ORAL_TABLET | Freq: Every day | ORAL | 0 refills | Status: DC

## 2022-06-07 MED ORDER — LIDOCAINE 5 % EX PTCH: 1.0000 | MEDICATED_PATCH | CUTANEOUS | 0 refills | Status: DC

## 2022-06-07 MED ORDER — FLUTICASONE PROPIONATE 50 MCG/ACT NA SUSP: 1.0000 | Freq: Every day | NASAL | 0 refills | Status: DC

## 2022-06-07 MED ORDER — BENZONATATE 100 MG PO CAPS: 100.0000 mg | ORAL_CAPSULE | Freq: Three times a day (TID) | ORAL | 0 refills | Status: DC

## 2022-06-07 NOTE — ED Triage Notes (Signed)
Patient c/o non-productive cough, congestion, nasal drainage x 4 days.  Afebrile.  Home COVID test was negative.  Patient has taken Chloracidin, Tylenol and Mucinex.

## 2022-06-07 NOTE — Discharge Instructions (Signed)
Your x-ray was normal with no evidence of pneumonia.  I suspect that your cough and other symptoms are related to either a mild viral illness or allergies.  Please use Tessalon for cough and loratadine as well as Flonase (fluticasone) to manage your congestion symptoms.  I recommend that you drink plenty of fluids and use a humidifier as well as nasal saline for symptom relief.  If your symptoms are not improving within a week please return for reevaluation.  If anything worsens you should be seen immediately.  Your urine did have some blood and was concentrated indicating you are dehydrated.  As we discussed, this can be a sign of a kidney stone.  I am not very concerned about a kidney stone given how you are describing the pain at this time but if you have any worsening symptoms including increasing pain, change in distribution of pain, blood in your urine you should be seen immediately.  Use lidocaine patches during the day and then remove them for minimum of 12 hours at night; use only 1 patch per 24 hours.  As we discussed, you should follow-up with your primary care within a week for a urine recheck.  If anything worsens please return for reevaluation.

## 2022-06-07 NOTE — ED Provider Notes (Signed)
[facility]    CSN: [id number] Arrival date & time: 06/07/22  1015      History   Chief Complaint Chief Complaint  Patient presents with   Cough    Cough and congestion for 4 days. Taking Tylenol, Mucinex and Coricidan. Covid test was negative. Pain in back. Could be from coughing and want to make sure it is nothing more serious. - Entered by patient    HPI Maria Nunez is a 62 y.o. female.   Patient presents today with a 4 to 5-day history of URI symptoms including mild congestion but significant cough.  He denies any shortness of breath, chest pain, nausea, vomiting, fever, sore throat.  Denies any known sick contacts.  She did have a COVID test at home that was negative.  She reports she has had COVID in the past with last episode in February 2024.  Denies any recent antibiotics or steroids.  She has been taking Coricidin and Mucinex without improvement of symptoms.  She has a history of seasonal allergies but does not take medication for this regularly.  She denies history of smoking, asthma, COPD.  She does have a history of diabetes but reports her blood sugars have been adequately controlled.  She is also concerned because she is having left-sided thoracic back pain.  She is unsure if this is related to a cough or potentially something more serious like pneumonia.    Past Medical History:  Diagnosis Date   Abnormal ultrasound of ovary 2016   Ultrasound showing bilateral ovaries in contact with one another.  Asymptomatic.   Allergy    multiple med allergies   Anemia 04/29/2020   Arthritis    left knee   Bursitis 01/24/2016   left shoulder   Chest pain 01/29/2021   See 01/29/21 ED note in Epic. Pain significantly improved w/ NTG. CBC, CMP, troponin, BNP unremarkable. EKG showed NSR w/o acute ST changes. 02/19/21 [facility] Stress test - normal.   Chest pain 01/13/2019   Troponin negative x 2, Cxray normal, EKG sinus rhythm w/ borderline T  abnormality. Discharged from ER w/ instruction to f/u w/PCP.   Chronic back pain    Chronic kidney disease    Stage 3, follows w/PCP   Complication of anesthesia    Pt states that one time she was told that she was given general anesthesia too fast. She woke up covered in blotches and was given IV Benadryl per pt.   COVID-19 03/05/2021   congestion, fever, all symptoms resolved as of 05/16/21   Diabetes mellitus    Type 2   Fatty liver    Fibroid    hysteretomy in 2002   GERD (gastroesophageal reflux disease)    Patient is taking Pepcid as of 05/16/21.   Heart murmur    very long time ago per pt on 05/16/21   Hidradenitis    years ago per pt on 05/16/21   High cholesterol    follows w/ PCP Dr. Nani Gasser   Hypertension    follows with PCP Dr. Nani Gasser   Plantar fasciitis, bilateral 09/24/2020   Sleep apnea    wears CPAP   Spondylosis 2023   lumbar, cervicothoracic region with cervicalgia   Wears contact lenses    Wears glasses     Patient Active Problem List   Diagnosis Date Noted   Right lumbar radiculitis 11/28/2021   Nocturnal leg cramps 06/13/2021   CKD (chronic kidney disease) stage 3, GFR  30-59 ml/min (HCC) 06/26/2020   Morbid obesity (HCC) 05/30/2020   Screening for malignant neoplasm of colon 05/30/2020   Gastroesophageal reflux disease without esophagitis 05/16/2020   Atypical chest pain 05/16/2020   Normocytic anemia 05/08/2020   Fatty liver 05/01/2020   Decreased appetite 04/22/2020   Non-intractable vomiting 04/22/2020   OSA (obstructive sleep apnea) 02/08/2020   Plantar fasciitis, bilateral 09/19/2019   Pain in right hand 02/21/2018   Bursitis of left shoulder 01/24/2016   GAD (generalized anxiety disorder) 04/02/2015   Localized primary osteoarthritis of carpometacarpal joint of right thumb 03/15/2015   Carpal tunnel syndrome, bilateral 04/05/2013   LGSIL (low grade squamous intraepithelial dysplasia) 06/16/2012   Type 2 diabetes  mellitus with other specified complication (HCC) 07/02/2009   PANIC ATTACK 09/07/2008   RESTLESS LEG SYNDROME 04/02/2008   POLYARTHRITIS 04/02/2008   Primary osteoarthritis of both knees 01/02/2008   HOT FLASHES 08/04/2006   Hyperlipidemia associated with type 2 diabetes mellitus (HCC) 11/03/2005   HYPERTENSION, BENIGN SYSTEMIC 11/03/2005    Past Surgical History:  Procedure Laterality Date   BREAST EXCISIONAL BIOPSY Right 2020   BREAST SURGERY     cysts removed right breast, around 20 years ago per pt on 05/16/21   BREAST SURGERY  08/2018   benign mass   CARPAL TUNNEL RELEASE Right 01/08/2017   Procedure: CARPAL TUNNEL RELEASE, POSSIBLE RIGHT THUMB CARPOMETACARPAL INJECTION;  Surgeon: Dominica Severin, MD;  Location: [facility];  Service: Orthopedics;  Laterality: Right;  60 MINS   CESAREAN SECTION     1992 & 1994   COLPOSCOPY N/A 05/19/2021   Procedure: COLPOSCOPY;  Surgeon: Patton Salles, MD;  Location: [facility];  Service: Gynecology;  Laterality: N/A;   ESOPHAGOGASTRODUODENOSCOPY  07/19/2020   in Epic   INGUINAL HIDRADENITIS EXCISION     many years ago per pt on 05/16/21   LEEP  05/04/2013   HPV change   LEEP N/A 05/19/2021   Procedure: LOOP ELECTROSURGICAL EXCISION PROCEDURE (LEEP), ENDOCERVICAL CURETTAGE;  Surgeon: Patton Salles, MD;  Location[facility];  Service: Gynecology;  Laterality: N/A;   SUPRACERVICAL ABDOMINAL HYSTERECTOMY  2002   Done for bleeding, fibroids in [place]    OB History     Gravida  3   Para  2   Term  2   Preterm      AB  1   Living  2      SAB  1   IAB      Ectopic      Multiple      Live Births               Home Medications    Prior to Admission medications   Medication Sig Start Date End Date Taking? Authorizing Provider  AMBULATORY NON FORMULARY MEDICATION Set CPAP to 10 cm water pressure. Need download in 2 weeks after change of setting. 09/10/20   Yes Agapito Games, MD  Ascorbic Acid (VITAMIN C) 1000 MG tablet Take 1 tablet by mouth daily. 04/10/18  Yes [provider]  benzonatate (TESSALON) 100 MG capsule Take 1 capsule (100 mg total) by mouth every 8 (eight) hours. 06/07/22  Yes Canary Fister K, PA-C  Blood Glucose Monitoring Suppl (FREESTYLE LITE) DEVI  03/13/19  Yes [provider]  celecoxib (CELEBREX) 100 MG capsule Take 1-2 capsules (100-200 mg total) by mouth daily as needed. For pain 04/08/22  Yes Monica Becton, MD  Cyanocobalamin (B-12) 500 MCG TABS Take 1 tablet by mouth daily.   Yes [provider]  estradiol (ESTRACE) 0.1 MG/GM vaginal cream Insert 1/2 gram vaginally three times a week. 05/04/22  Yes Patton Salles, MD  famotidine (PEPCID) 40 MG tablet Take 1 tablet by mouth two times daily. 08/06/21  Yes Agapito Games, MD  fluticasone (FLONASE) 50 MCG/ACT nasal spray Place 1 spray into both nostrils daily. 06/07/22  Yes Bayler Gehrig K, PA-C  FOLIC ACID PO Take 800 mcg by mouth daily.   Yes [provider]  Lancets (FREESTYLE) lancets For use when testing blood sugars daily. DX:E11.69 03/13/19  Yes Agapito Games, MD  lidocaine (LIDODERM) 5 % Place 1 patch onto the skin daily. Remove & Discard patch within 12 hours or as directed by MD 06/07/22  Yes Amariyon Maynes, Denny Peon K, PA-C  loratadine (CLARITIN) 10 MG tablet Take 1 tablet (10 mg total) by mouth daily. 06/07/22  Yes Domanick Cuccia K, PA-C  magnesium oxide (MAG-OX) 400 MG tablet Take 2 tablets (800 mg total) by mouth at bedtime. 04/08/22  Yes Monica Becton, MD  melatonin 1 MG TABS tablet Take 1 mg by mouth at bedtime.   Yes [provider]  metFORMIN (GLUCOPHAGE) 1000 MG tablet Take 1 tablet (1,000 mg total) by mouth 2 (two) times daily with a meal. 01/01/22 01/01/23 Yes Agapito Games, MD  pyridOXINE (VITAMIN B-6) 100 MG tablet Take 100 mg by mouth daily.   Yes [provider]   rosuvastatin (CRESTOR) 40 MG tablet TAKE 1 TABLET (40 MG TOTAL) BY MOUTH DAILY. 08/06/21 08/06/22 Yes Agapito Games, MD  valsartan (DIOVAN) 80 MG tablet Take 1 tablet (80 mg total) by mouth daily. 04/01/22 03/01/23 Yes Agapito Games, MD  Zinc Sulfate (ZINC 15 PO) Take by mouth.   Yes [provider]  glucose blood (FREESTYLE LITE) test strip USE TO TEST BLOOD SUGARS TWICE DAILY 05/09/20 08/04/21  Agapito Games, MD    Family History Family History  Problem Relation Age of Onset   Asthma Mother    COPD Mother    Diabetes Mother    Hypertension Mother    Diabetes Father    Hypertension Father    Breast cancer Maternal Aunt    Breast cancer Paternal Aunt    Colon cancer Neg Hx    Pancreatic cancer Neg Hx    Esophageal cancer Neg Hx    Stomach cancer Neg Hx    Liver disease Neg Hx     Social History Social History   Tobacco Use   Smoking status: Never   Smokeless tobacco: Never  Vaping Use   Vaping Use: Never used  Substance Use Topics   Alcohol use: No    Alcohol/week: 0.0 standard drinks of alcohol   Drug use: No     Allergies   Sulfonamide derivatives, Xigduo xr [dapagliflozin pro-metformin er], Hycodan [hydrocodone bit-homatrop mbr], Doxycycline, Omeprazole, Cefaclor, Cephalexin, Januvia [sitagliptin], Levofloxacin, Penicillins, Tetracycline, and Victoza [liraglutide]   Review of Systems Review of Systems  Constitutional:  Positive for activity change. Negative for appetite change, fatigue and fever.  HENT:  Positive for congestion. Negative for sinus pressure, sneezing and sore throat.   Respiratory:  Positive for cough. Negative for shortness of breath.   Cardiovascular:  Negative for chest pain.  Gastrointestinal:  Negative for abdominal pain, diarrhea, nausea and vomiting.  Musculoskeletal:  Positive for back pain. Negative for arthralgias and myalgias.  Neurological:  Negative  for dizziness, light-headedness and headaches.      Physical Exam Triage Vital Signs ED Triage Vitals  Enc Vitals Group     BP 06/07/22 1027 130/76     Pulse Rate 06/07/22 1027 85     Resp 06/07/22 1027 18     Temp 06/07/22 1027 98.6 F (37 C)     Temp Source 06/07/22 1027 Oral     SpO2 06/07/22 1027 96 %     Weight 06/07/22 1029 155 lb (70.3 kg)     Height 06/07/22 1029 4\' 11"  (1.499 m)     Head Circumference --      Peak Flow --      Pain Score 06/07/22 1028 6     Pain Loc --      Pain Edu? --      Excl. in GC? --    No data found.  Updated Vital Signs BP 130/76 (BP Location: Right Arm)   Pulse 85   Temp 98.6 F (37 C) (Oral)   Resp 18   Ht 4\' 11"  (1.499 m)   Wt 155 lb (70.3 kg)   LMP 01/27/2000 (Approximate) Comment: supracervical hysterectomy  SpO2 96%   BMI 31.31 kg/m   Visual Acuity Right Eye Distance:   Left Eye Distance:   Bilateral Distance:    Right Eye Near:   Left Eye Near:    Bilateral Near:     Physical Exam Vitals reviewed.  Constitutional:      General: She is awake. She is not in acute distress.    Appearance: Normal appearance. She is well-developed. She is not ill-appearing.     Comments: Very pleasant female appears stated age in no acute distress sitting comfortably in exam room  HENT:     Head: Normocephalic and atraumatic.     Right Ear: Tympanic membrane, ear canal and external ear normal. Tympanic membrane is not erythematous or bulging.     Left Ear: Tympanic membrane, ear canal and external ear normal. Tympanic membrane is not erythematous or bulging.     Nose:     Right Sinus: No maxillary sinus tenderness or frontal sinus tenderness.     Left Sinus: No maxillary sinus tenderness or frontal sinus tenderness.     Mouth/Throat:     Pharynx: Uvula midline. No oropharyngeal exudate or posterior oropharyngeal erythema.  Cardiovascular:     Rate and Rhythm: Normal rate and regular rhythm.     Heart sounds: Normal heart sounds, S1 normal and S2 normal. No murmur  heard. Pulmonary:     Effort: Pulmonary effort is normal.     Breath sounds: Examination of the left-lower field reveals decreased breath sounds. Decreased breath sounds present. No wheezing, rhonchi or rales.  Abdominal:     General: Bowel sounds are normal.     Palpations: Abdomen is soft.     Tenderness: There is no abdominal tenderness. There is no right CVA tenderness, left CVA tenderness, guarding or rebound.  Musculoskeletal:     Cervical back: No tenderness or bony tenderness.     Thoracic back: No spasms, tenderness or bony tenderness.     Lumbar back: No tenderness or bony tenderness.     Right lower leg: No edema.     Left lower leg: No edema.     Comments: Back: No pain percussion of vertebrae.  No tenderness palpation of paraspinal muscles.  Pain is not reproducible on exam.  Normal active range of motion throughout the spine.  No  deformity or step-off noted.  Lymphadenopathy:     Head:     Right side of head: No submental, submandibular or tonsillar adenopathy.     Left side of head: No submental, submandibular or tonsillar adenopathy.     Cervical: No cervical adenopathy.  Psychiatric:        Behavior: Behavior is cooperative.      UC Treatments / Results  Labs (all labs ordered are listed, but only abnormal results are displayed) Labs Reviewed  POCT URINALYSIS DIP (MANUAL ENTRY) - Abnormal; Notable for the following components:      Result Value   Color, UA light yellow (*)    Clarity, UA cloudy (*)    Spec Grav, UA >=1.030 (*)    Blood, UA moderate (*)    Protein Ur, POC =100 (*)    All other components within normal limits    EKG   Radiology DG Chest 2 View  Result Date: 06/07/2022 CLINICAL DATA:  Cough. EXAM: CHEST - 2 VIEW COMPARISON:  Chest x-ray January 13, 2019 FINDINGS: The heart size and mediastinal contours are within normal limits. Both lungs are clear. The visualized skeletal structures are unremarkable. IMPRESSION: No active cardiopulmonary  disease. Electronically Signed   By: Gerome Sam III M.D.   On: 06/07/2022 10:53    Procedures Procedures (including critical care time)  Medications Ordered in UC Medications - No data to display  Initial Impression / Assessment and Plan / UC Course  I have reviewed the triage vital signs and the nursing notes.  Pertinent labs & imaging results that were available during my care of the patient were reviewed by me and considered in my medical decision making (see chart for details).     Patient is well-appearing, afebrile, nontoxic, nontachycardic with oxygen saturation of 96%.  Chest x-ray was obtained given location of pain and worsening cough which showed no acute cardiopulmonary disease or other abnormalities.  Discussed that symptoms are likely related to a mild illness versus allergies.  Will start loratadine and fluticasone to help manage symptoms.  Tessalon was provided for cough.  Recommend that she use nasal saline/sinus rinses as well as a humidifier for additional symptom relief.  No evidence of acute infection on physical exam that warrant initiation of antibiotics.  Patient has had COVID in the past 90 days so COVID testing was deferred.  Recommend she follow-up with her primary care next week.  Discussed that if she has any worsening or changing symptoms including fever, worsening cough, shortness of breath, increasing pain she should be seen immediately.  I suspect that thoracic back pain is related to a muscle injury from coughing.  Urine was obtained that did show hematuria as well as increase specific gravity but no evidence of infection.  We discussed that this could be a sign of a kidney stone but given her description of the pain this is less likely.  We do not have CT capabilities in urgent care today and I do not think abdominal x-ray would be particularly helpful at this time given low suspicion of nephrolithiasis.  Did recommend that she push fluids.  She is to  follow-up with her primary care next week for a recheck of her urine to ensure that she has a normal hydration status and without the blood noted today resolves.  She can use Tylenol for pain relief and was given a prescription for lidocaine patches as well.  If she has any worsening or changing symptoms that she needs  to be seen immediately including increasing pain, fever, nausea, vomiting, weakness.  Final Clinical Impressions(s) / UC Diagnoses   Final diagnoses:  Upper respiratory tract infection, unspecified type  Nasal congestion  Acute cough  Acute left-sided thoracic back pain     Discharge Instructions      Your x-ray was normal with no evidence of pneumonia.  I suspect that your cough and other symptoms are related to either a mild viral illness or allergies.  Please use Tessalon for cough and loratadine as well as Flonase (fluticasone) to manage your congestion symptoms.  I recommend that you drink plenty of fluids and use a humidifier as well as nasal saline for symptom relief.  If your symptoms are not improving within a week please return for reevaluation.  If anything worsens you should be seen immediately.  Your urine did have some blood and was concentrated indicating you are dehydrated.  As we discussed, this can be a sign of a kidney stone.  I am not very concerned about a kidney stone given how you are describing the pain at this time but if you have any worsening symptoms including increasing pain, change in distribution of pain, blood in your urine you should be seen immediately.  Use lidocaine patches during the day and then remove them for minimum of 12 hours at night; use only 1 patch per 24 hours.  As we discussed, you should follow-up with your primary care within a week for a urine recheck.  If anything worsens please return for reevaluation.     ED Prescriptions     Medication Sig Dispense Auth. Provider   fluticasone (FLONASE) 50 MCG/ACT nasal spray Place 1  spray into both nostrils daily. 16 g Maria Nunez K, PA-C   loratadine (CLARITIN) 10 MG tablet Take 1 tablet (10 mg total) by mouth daily. 14 tablet Ovetta Bazzano K, PA-C   benzonatate (TESSALON) 100 MG capsule Take 1 capsule (100 mg total) by mouth every 8 (eight) hours. 21 capsule Matasha Smigelski K, PA-C   lidocaine (LIDODERM) 5 % Place 1 patch onto the skin daily. Remove & Discard patch within 12 hours or as directed by MD 30 patch Chandrea Zellman K, PA-C      PDMP not reviewed this encounter.   Jeani Hawking, PA-C 06/07/22 1148

## 2022-06-08 DIAGNOSIS — G4733 Obstructive sleep apnea (adult) (pediatric): Secondary | ICD-10-CM | POA: Diagnosis not present

## 2022-06-10 ENCOUNTER — Encounter: Payer: Self-pay | Admitting: Family Medicine

## 2022-06-10 ENCOUNTER — Ambulatory Visit: Payer: Commercial Managed Care - PPO | Admitting: Family Medicine

## 2022-06-10 VITALS — BP 136/62 | HR 92 | Temp 98.3°F | Ht 59.5 in | Wt 158.0 lb

## 2022-06-10 DIAGNOSIS — J22 Unspecified acute lower respiratory infection: Secondary | ICD-10-CM | POA: Diagnosis not present

## 2022-06-10 DIAGNOSIS — R3129 Other microscopic hematuria: Secondary | ICD-10-CM | POA: Diagnosis not present

## 2022-06-10 LAB — POCT URINALYSIS DIP (CLINITEK)
Bilirubin, UA: NEGATIVE
Glucose, UA: NEGATIVE mg/dL
Ketones, POC UA: NEGATIVE mg/dL
Leukocytes, UA: NEGATIVE
Nitrite, UA: NEGATIVE
POC PROTEIN,UA: 100 — AB
Spec Grav, UA: 1.015 (ref 1.010–1.025)
Urobilinogen, UA: 0.2 E.U./dL
pH, UA: 5.5 (ref 5.0–8.0)

## 2022-06-10 MED ORDER — AZITHROMYCIN 250 MG PO TABS: ORAL_TABLET | ORAL | 0 refills | Status: AC

## 2022-06-10 NOTE — Progress Notes (Signed)
Acute Office Visit  Subjective:     Patient ID: Maria Nunez, female    DOB: Dec 16, 1960, 62 y.o.   MRN: [id number]  Chief Complaint  Patient presents with   Follow-up    HPI Patient is in today for cough x 2 weeks.  She says when it first started she had a pretty sore throat but the sore throat has resolved.  She has not run of temperature but has had bodyaches and chills on and off.  She did go to urgent care on Monday they felt like it was most consistent with a viral illness she is here today because she is still not feeling well.  She is coughing up some sputum she has been taking Mucinex, Claritin, Tylenol, Coricidin.  She says her chest is not as tight as it was previously.  She has been using the [employer].  The cough has been keeping her awake at night.  No sinus symptoms currently.Neg home COVID test.   She was also think some back pain when she went so they did do a urine just to rule out a kidney stone.  Urinalysis did show some blood.  ROS      Objective:    BP 136/62   Pulse 92   Temp 98.3 F (36.8 C)   Ht 4' 11.5" (1.511 m)   Wt 158 lb (71.7 kg)   LMP 01/27/2000 (Approximate) Comment: supracervical hysterectomy  SpO2 96%   BMI 31.38 kg/m    Physical Exam Vitals and nursing note reviewed.  Constitutional:      Appearance: She is well-developed.  HENT:     Head: Normocephalic and atraumatic.     Right Ear: External ear normal.     Left Ear: External ear normal.     Nose: Nose normal.  Eyes:     Conjunctiva/sclera: Conjunctivae normal.     Pupils: Pupils are equal, round, and reactive to light.  Neck:     Thyroid: No thyromegaly.  Cardiovascular:     Rate and Rhythm: Normal rate and regular rhythm.     Heart sounds: Normal heart sounds.  Pulmonary:     Effort: Pulmonary effort is normal.     Breath sounds: Normal breath sounds. No wheezing.  Musculoskeletal:     Cervical back: Neck supple.  Lymphadenopathy:     Cervical: No  cervical adenopathy.  Skin:    General: Skin is warm and dry.  Neurological:     Mental Status: She is alert and oriented to person, place, and time.  Psychiatric:        Behavior: Behavior normal.     Results for orders placed or performed in visit on 06/10/22  POCT URINALYSIS DIP (CLINITEK)  Result Value Ref Range   Color, UA yellow yellow   Clarity, UA cloudy (A) clear   Glucose, UA negative negative mg/dL   Bilirubin, UA negative negative   Ketones, POC UA negative negative mg/dL   Spec Grav, UA 8.295 6.213 - 1.025   Blood, UA moderate (A) negative   pH, UA 5.5 5.0 - 8.0   POC PROTEIN,UA =100 (A) negative, trace   Urobilinogen, UA 0.2 0.2 or 1.0 E.U./dL   Nitrite, UA Negative Negative   Leukocytes, UA Negative Negative        Assessment & Plan:   Problem List Items Addressed This Visit   None Visit Diagnoses     Microscopic hematuria    -  Primary   Relevant Orders  POCT URINALYSIS DIP (CLINITEK) (Completed)   Lower respiratory infection       Relevant Medications   azithromycin (ZITHROMAX) 250 MG tablet       Microscopic hematuria-will repeat urinalysis today if it still showing blood then we will send for microscopic review.  Respiratory tract infection-we will go ahead and treat with azithromycin since symptoms have been persistent for 2 weeks and not improving.  Can add a steroid if needed.  Meds ordered this encounter  Medications   azithromycin (ZITHROMAX) 250 MG tablet    Sig: 2 Ttabs PO on Day 1, then one a day x 4 days.    Dispense:  6 tablet    Refill:  0    Return if symptoms worsen or fail to improve.  Nani Gasser, MD

## 2022-06-10 NOTE — Addendum Note (Signed)
Addended by: Deno Etienne on: 06/10/2022 12:21 PM   Modules accepted: Orders

## 2022-06-11 LAB — URINALYSIS, MICROSCOPIC ONLY
Bacteria, UA: NONE SEEN /HPF
Hyaline Cast: NONE SEEN /LPF

## 2022-06-11 NOTE — Progress Notes (Signed)
Great news, no whole red blood cells so normal under the microscope

## 2022-06-29 ENCOUNTER — Other Ambulatory Visit (HOSPITAL_BASED_OUTPATIENT_CLINIC_OR_DEPARTMENT_OTHER): Payer: Self-pay

## 2022-06-29 ENCOUNTER — Other Ambulatory Visit: Payer: Self-pay

## 2022-07-01 ENCOUNTER — Ambulatory Visit: Payer: Commercial Managed Care - PPO | Admitting: Family Medicine

## 2022-07-09 DIAGNOSIS — G4733 Obstructive sleep apnea (adult) (pediatric): Secondary | ICD-10-CM | POA: Diagnosis not present

## 2022-07-10 ENCOUNTER — Encounter: Payer: Self-pay | Admitting: Family Medicine

## 2022-08-06 ENCOUNTER — Ambulatory Visit: Payer: Commercial Managed Care - PPO | Admitting: Family Medicine

## 2022-08-08 ENCOUNTER — Ambulatory Visit: Payer: Commercial Managed Care - PPO

## 2022-08-08 DIAGNOSIS — G4733 Obstructive sleep apnea (adult) (pediatric): Secondary | ICD-10-CM | POA: Diagnosis not present

## 2022-08-28 ENCOUNTER — Ambulatory Visit: Payer: Commercial Managed Care - PPO | Admitting: Family Medicine

## 2022-08-28 ENCOUNTER — Encounter: Payer: Self-pay | Admitting: Family Medicine

## 2022-08-28 ENCOUNTER — Other Ambulatory Visit (HOSPITAL_BASED_OUTPATIENT_CLINIC_OR_DEPARTMENT_OTHER): Payer: Self-pay

## 2022-08-28 ENCOUNTER — Other Ambulatory Visit: Payer: Self-pay

## 2022-08-28 VITALS — BP 139/49 | HR 79 | Ht 59.5 in | Wt 162.0 lb

## 2022-08-28 DIAGNOSIS — Z7984 Long term (current) use of oral hypoglycemic drugs: Secondary | ICD-10-CM

## 2022-08-28 DIAGNOSIS — I10 Essential (primary) hypertension: Secondary | ICD-10-CM | POA: Diagnosis not present

## 2022-08-28 DIAGNOSIS — E1169 Type 2 diabetes mellitus with other specified complication: Secondary | ICD-10-CM | POA: Diagnosis not present

## 2022-08-28 LAB — POCT GLYCOSYLATED HEMOGLOBIN (HGB A1C): Hemoglobin A1C: 6.6 % — AB (ref 4.0–5.6)

## 2022-08-28 MED ORDER — FREESTYLE LITE TEST VI STRP
ORAL_STRIP | 12 refills | Status: DC
  Filled 2022-08-28: qty 200, 90d supply, fill #0

## 2022-08-28 NOTE — Progress Notes (Signed)
   Established Patient Office Visit  Subjective   Patient ID: Maria Nunez, female    DOB: 1960/09/03  Age: 62 y.o. MRN: [id number]  Chief Complaint  Patient presents with   Diabetes    HPI  Diabetes - no hypoglycemic events. No wounds or sores that are not healing well. No increased thirst or urination. Checking glucose at home. Taking medications as prescribed without any side effects. Has been walking on treadmill for exercise.    Hypertension- Pt denies chest pain, SOB, dizziness, or heart palpitations.  Taking meds as directed w/o problems.  Denies medication side effects.    Work has been some stressful. Needs refill on strips.      ROS    Objective:     BP (!) 139/49   Pulse 79   Ht 4' 11.5" (1.511 m)   Wt 162 lb (73.5 kg)   LMP 01/27/2000 (Approximate) Comment: supracervical hysterectomy  SpO2 97%   BMI 32.17 kg/m    Physical Exam Vitals and nursing note reviewed.  Constitutional:      Appearance: She is well-developed.  HENT:     Head: Normocephalic and atraumatic.  Cardiovascular:     Rate and Rhythm: Normal rate and regular rhythm.     Heart sounds: Normal heart sounds.  Pulmonary:     Effort: Pulmonary effort is normal.     Breath sounds: Normal breath sounds.  Skin:    General: Skin is warm and dry.  Neurological:     Mental Status: She is alert and oriented to person, place, and time.  Psychiatric:        Behavior: Behavior normal.      Results for orders placed or performed in visit on 08/28/22  POCT glycosylated hemoglobin (Hb A1C)  Result Value Ref Range   Hemoglobin A1C 6.6 (A) 4.0 - 5.6 %   HbA1c POC (<> result, manual entry)     HbA1c, POC (prediabetic range)     HbA1c, POC (controlled diabetic range)        The ASCVD Risk score (Arnett DK, et al., 2019) failed to calculate for the following reasons:   The valid total cholesterol range is 130 to 320 mg/dL    Assessment & Plan:   Problem List Items Addressed This  Visit       Cardiovascular and Mediastinum   HYPERTENSION, BENIGN SYSTEMIC    Pressure is a little borderline elevated today at 139.  Normally runs a little better and the diastolic is on the low end.  Will follow and see if still elevated at next visit if so then we will increase Diovan.  Follow up in 3-4 mo       Relevant Orders   CMP14+EGFR   Lipid panel     Endocrine   Type 2 diabetes mellitus with other specified complication (HCC) - Primary    Well controlled. Continue current regimen. Follow up in  71mo       Relevant Medications   glucose blood (FREESTYLE LITE) test strip   Other Relevant Orders   POCT glycosylated hemoglobin (Hb A1C) (Completed)   POCT UA - Microalbumin   CMP14+EGFR   Lipid panel   Reminded to get flu vaccine this fall.   But the walking that is absolutely fantastic.  Return in about 4 months (around 12/28/2022) for Diabetes follow-up.    Nani Gasser, MD

## 2022-08-28 NOTE — Assessment & Plan Note (Addendum)
Pressure is a little borderline elevated today at 139.  Normally runs a little better and the diastolic is on the low end.  Will follow and see if still elevated at next visit if so then we will increase Diovan.  Follow up in 3-4 mo

## 2022-08-28 NOTE — Assessment & Plan Note (Signed)
Well controlled. Continue current regimen. Follow up in  4 mo 

## 2022-08-31 NOTE — Progress Notes (Signed)
HI Maria Nunez,  Kidney function is stable at 1.1.  You did look a little bit dry on your blood work so just make sure you are still continuing to push water intake during the day.  Liver function looks good.  LDL cholesterol looks at goal.  [place] job!

## 2022-09-01 ENCOUNTER — Other Ambulatory Visit: Payer: Self-pay | Admitting: Family Medicine

## 2022-09-01 ENCOUNTER — Encounter: Payer: Self-pay | Admitting: Family Medicine

## 2022-09-01 DIAGNOSIS — Z1231 Encounter for screening mammogram for malignant neoplasm of breast: Secondary | ICD-10-CM

## 2022-09-16 NOTE — Telephone Encounter (Signed)
I called and scheduled patient.  ° °

## 2022-09-18 DIAGNOSIS — G4733 Obstructive sleep apnea (adult) (pediatric): Secondary | ICD-10-CM | POA: Diagnosis not present

## 2022-09-23 ENCOUNTER — Ambulatory Visit: Payer: Commercial Managed Care - PPO | Admitting: Family Medicine

## 2022-09-23 ENCOUNTER — Encounter: Payer: Self-pay | Admitting: Family Medicine

## 2022-09-23 VITALS — BP 131/46 | HR 81 | Ht 59.5 in | Wt 161.0 lb

## 2022-09-23 DIAGNOSIS — N644 Mastodynia: Secondary | ICD-10-CM

## 2022-09-23 DIAGNOSIS — E1169 Type 2 diabetes mellitus with other specified complication: Secondary | ICD-10-CM | POA: Diagnosis not present

## 2022-09-23 DIAGNOSIS — Z7984 Long term (current) use of oral hypoglycemic drugs: Secondary | ICD-10-CM | POA: Diagnosis not present

## 2022-09-23 NOTE — Progress Notes (Signed)
   Acute Office Visit  Subjective:     Patient ID: Maria Nunez, female    DOB: Mar 10, 1960, 62 y.o.   MRN: [id number]  Chief Complaint  Patient presents with   Breast Pain         HPI Patient is in today for right breast pain.  She has had problems with her right breast on nad off for years after having kids. She says she had a large cyst that rupture years ago when she lived in [place] and had to have it removed. It came back the next year. No nipple d/c. She has started having pain again.  No mass.   Prior bx in 2020 with clip placement for lesion at the 12 o'clock position on the right. It was benign.   ROS      Objective:    BP (!) 131/46   Pulse 81   Ht 4' 11.5" (1.511 m)   Wt 161 lb (73 kg)   LMP 01/27/2000 (Approximate) Comment: supracervical hysterectomy  SpO2 97%   BMI 31.97 kg/m    Physical Exam Exam conducted with a chaperone present.  Chest:     Chest wall: No mass.  Breasts:    Right: Normal. No mass, nipple discharge or skin change.     Left: Normal. No mass, nipple discharge or skin change.     Comments: Old scar on the right inner lower quad of the breast Lymphadenopathy:     Upper Body:     Right upper body: No supraclavicular, axillary or pectoral adenopathy.     Left upper body: No supraclavicular, axillary or pectoral adenopathy.     No results found for any visits on 09/23/22.      Assessment & Plan:   Problem List Items Addressed This Visit       Endocrine   Type 2 diabetes mellitus with other specified complication (HCC) - Primary   Other Visit Diagnoses     Breast pain, right       Relevant Orders   MM 3D DIAGNOSTIC MAMMOGRAM BILATERAL BREAST   US BREAST COMPLETE UNI RIGHT INC AXILLA      Due for urine micro. Unable to given sample today .will collect at next OV>   No orders of the defined types were placed in this encounter.   No follow-ups on file.  Nani Gasser, MD

## 2022-09-30 ENCOUNTER — Other Ambulatory Visit: Payer: Self-pay | Admitting: Family Medicine

## 2022-10-01 MED ORDER — ROSUVASTATIN CALCIUM 40 MG PO TABS
40.0000 mg | ORAL_TABLET | Freq: Every day | ORAL | 3 refills | Status: DC
  Filled 2022-10-01: qty 90, 90d supply, fill #0
  Filled 2022-12-29: qty 90, 90d supply, fill #1

## 2022-10-02 ENCOUNTER — Other Ambulatory Visit (HOSPITAL_BASED_OUTPATIENT_CLINIC_OR_DEPARTMENT_OTHER): Payer: Self-pay

## 2022-10-06 DIAGNOSIS — L570 Actinic keratosis: Secondary | ICD-10-CM | POA: Diagnosis not present

## 2022-10-06 DIAGNOSIS — L918 Other hypertrophic disorders of the skin: Secondary | ICD-10-CM | POA: Diagnosis not present

## 2022-10-12 ENCOUNTER — Encounter: Payer: Self-pay | Admitting: Family Medicine

## 2022-10-12 ENCOUNTER — Encounter: Payer: Self-pay | Admitting: Sports Medicine

## 2022-10-12 ENCOUNTER — Ambulatory Visit: Payer: Commercial Managed Care - PPO | Admitting: Sports Medicine

## 2022-10-12 ENCOUNTER — Other Ambulatory Visit (HOSPITAL_BASED_OUTPATIENT_CLINIC_OR_DEPARTMENT_OTHER): Payer: Self-pay

## 2022-10-12 ENCOUNTER — Ambulatory Visit (INDEPENDENT_AMBULATORY_CARE_PROVIDER_SITE_OTHER): Payer: Commercial Managed Care - PPO

## 2022-10-12 DIAGNOSIS — E1169 Type 2 diabetes mellitus with other specified complication: Secondary | ICD-10-CM

## 2022-10-12 DIAGNOSIS — M545 Low back pain, unspecified: Secondary | ICD-10-CM | POA: Diagnosis not present

## 2022-10-12 DIAGNOSIS — M5136 Other intervertebral disc degeneration, lumbar region: Secondary | ICD-10-CM

## 2022-10-12 DIAGNOSIS — M51369 Other intervertebral disc degeneration, lumbar region without mention of lumbar back pain or lower extremity pain: Secondary | ICD-10-CM

## 2022-10-12 DIAGNOSIS — M542 Cervicalgia: Secondary | ICD-10-CM | POA: Diagnosis not present

## 2022-10-12 DIAGNOSIS — M47816 Spondylosis without myelopathy or radiculopathy, lumbar region: Secondary | ICD-10-CM | POA: Diagnosis not present

## 2022-10-12 DIAGNOSIS — M47812 Spondylosis without myelopathy or radiculopathy, cervical region: Secondary | ICD-10-CM | POA: Diagnosis not present

## 2022-10-12 MED ORDER — METHOCARBAMOL 500 MG PO TABS
250.0000 mg | ORAL_TABLET | Freq: Three times a day (TID) | ORAL | 3 refills | Status: DC | PRN
Start: 2022-10-12 — End: 2022-12-17

## 2022-10-12 MED ORDER — METHOCARBAMOL 500 MG PO TABS
250.0000 mg | ORAL_TABLET | Freq: Three times a day (TID) | ORAL | 3 refills | Status: DC | PRN
Start: 2022-10-12 — End: 2022-10-12
  Filled 2022-10-12: qty 30, 20d supply, fill #0

## 2022-10-12 MED ORDER — PREDNISONE 50 MG PO TABS
ORAL_TABLET | ORAL | 0 refills | Status: DC
Start: 2022-10-12 — End: 2022-10-12
  Filled 2022-10-12: qty 5, fill #0

## 2022-10-12 MED ORDER — PREDNISONE 50 MG PO TABS
ORAL_TABLET | ORAL | 0 refills | Status: DC
Start: 2022-10-12 — End: 2022-12-17

## 2022-10-12 NOTE — Assessment & Plan Note (Signed)
Known lumbar spondylosis, historically well-controlled with conservative treatment, had a fall about a month ago while trying to chase a puppy dog, she fell impacting her left hip. Since then she has had pain in her neck, left-sided low back with radiation down to the anterior aspect of the left shin and ankle. No red flag symptoms, adding 5 days steroids, low-dose Robaxin per her request, x-rays of her cervical, lumbar, pelvis. Home PT for the neck and low back, return to see me in 4 to 6 weeks, we will consider MRI for epidural planning if not better.

## 2022-10-12 NOTE — Progress Notes (Signed)
    Procedures performed today:    None.  Independent interpretation of notes and tests performed by another provider:   None.  Brief History, Exam, Impression, and Recommendations:    Lumbar degenerative disc disease Known lumbar spondylosis, historically well-controlled with conservative treatment, had a fall about a month ago while trying to chase a puppy dog, she fell impacting her left hip. Since then she has had pain in her neck, left-sided low back with radiation down to the anterior aspect of the left shin and ankle. No red flag symptoms, adding 5 days steroids, low-dose Robaxin per her request, x-rays of her cervical, lumbar, pelvis. Home PT for the neck and low back, return to see me in 4 to 6 weeks, we will consider MRI for epidural planning if not better.    ____________________________________________ Ihor Austin. Benjamin Stain, M.D., ABFM., CAQSM., AME. Primary Care and Sports Medicine [facility]  Adjunct Professor of Family Medicine  [place] [employer] [facility] [employer]  Restaurant manager, fast food

## 2022-10-19 DIAGNOSIS — G4733 Obstructive sleep apnea (adult) (pediatric): Secondary | ICD-10-CM | POA: Diagnosis not present

## 2022-10-21 ENCOUNTER — Ambulatory Visit
Admission: RE | Admit: 2022-10-21 | Discharge: 2022-10-21 | Disposition: A | Discharge status: Home / Self Care | Payer: Commercial Managed Care - PPO | Source: Ambulatory Visit | Attending: Family Medicine | Admitting: Family Medicine

## 2022-10-21 DIAGNOSIS — N644 Mastodynia: Secondary | ICD-10-CM | POA: Diagnosis not present

## 2022-11-13 ENCOUNTER — Ambulatory Visit: Payer: Commercial Managed Care - PPO | Admitting: Sports Medicine

## 2022-11-16 ENCOUNTER — Other Ambulatory Visit: Payer: Self-pay | Admitting: Family Medicine

## 2022-11-16 DIAGNOSIS — K219 Gastro-esophageal reflux disease without esophagitis: Secondary | ICD-10-CM

## 2022-11-17 ENCOUNTER — Other Ambulatory Visit (HOSPITAL_BASED_OUTPATIENT_CLINIC_OR_DEPARTMENT_OTHER): Payer: Self-pay

## 2022-11-17 MED ORDER — FAMOTIDINE 40 MG PO TABS
40.0000 mg | ORAL_TABLET | Freq: Two times a day (BID) | ORAL | 1 refills | Status: DC
Start: 2022-11-17 — End: 2023-04-15
  Filled 2022-11-17: qty 180, 90d supply, fill #0

## 2022-11-18 DIAGNOSIS — G4733 Obstructive sleep apnea (adult) (pediatric): Secondary | ICD-10-CM | POA: Diagnosis not present

## 2022-12-17 ENCOUNTER — Ambulatory Visit (INDEPENDENT_AMBULATORY_CARE_PROVIDER_SITE_OTHER): Payer: Commercial Managed Care - PPO

## 2022-12-17 ENCOUNTER — Encounter: Payer: Self-pay | Admitting: Family Medicine

## 2022-12-17 ENCOUNTER — Ambulatory Visit: Payer: Commercial Managed Care - PPO | Admitting: Family Medicine

## 2022-12-17 VITALS — BP 163/88 | HR 82 | Ht 59.5 in | Wt 164.0 lb

## 2022-12-17 DIAGNOSIS — R109 Unspecified abdominal pain: Secondary | ICD-10-CM

## 2022-12-17 DIAGNOSIS — K8020 Calculus of gallbladder without cholecystitis without obstruction: Secondary | ICD-10-CM | POA: Diagnosis not present

## 2022-12-17 DIAGNOSIS — I878 Other specified disorders of veins: Secondary | ICD-10-CM | POA: Diagnosis not present

## 2022-12-17 DIAGNOSIS — R935 Abnormal findings on diagnostic imaging of other abdominal regions, including retroperitoneum: Secondary | ICD-10-CM | POA: Diagnosis not present

## 2022-12-17 LAB — POCT URINALYSIS DIP (CLINITEK)
Bilirubin, UA: NEGATIVE
Blood, UA: NEGATIVE
Glucose, UA: NEGATIVE mg/dL
Ketones, POC UA: NEGATIVE mg/dL
Nitrite, UA: NEGATIVE
POC PROTEIN,UA: NEGATIVE
Spec Grav, UA: 1.01 (ref 1.010–1.025)
Urobilinogen, UA: 0.2 U/dL
pH, UA: 6 (ref 5.0–8.0)

## 2022-12-17 NOTE — Assessment & Plan Note (Signed)
It sounds like she may have passed a stone earlier today.  Still with mild low back pain.  KUB ordered to evaluate for retained stone.  Recommend increased fluids.

## 2022-12-17 NOTE — Progress Notes (Signed)
Maria Nunez - 62 y.o. female MRN [id number]  Date of birth: Jun 20, 1960  Subjective Chief Complaint  Patient presents with   Flank Pain    HPI Maria Nunez is a 62 y.o. female here today with complaint of L flank pain.  Her symptoms started a couple of days ago.  Pain is a little better and she thinks she may have passed a stone earlier as she had a strange "pinching" sensation when urinating. She denies frequency or urgency.  She has not noted blood in her urine.  She did have low grade fever one night a few days ago but nothing since that time.  Denies  nausea or vomiting, chills, headache    ROS:  A comprehensive ROS was completed and negative except as noted per HPI  Allergies  Allergen Reactions   Sulfonamide Derivatives Hives    REACTION: hives   Xigduo Xr [Dapagliflozin Pro-Metformin Er] Hives   Hycodan [Hydrocodone Bit-Homatrop Mbr] Itching   Doxycycline Nausea And Vomiting   Omeprazole Itching   Cefaclor Rash   Cephalexin Rash   Januvia [Sitagliptin] Nausea Only    Back pain   Levofloxacin Itching and Rash   Penicillins Other (See Comments)    unknown   Tetracycline Nausea Only    GI upset   Victoza [Liraglutide] Other (See Comments)    Back pain    Past Medical History:  Diagnosis Date   Abnormal ultrasound of ovary 2016   Ultrasound showing bilateral ovaries in contact with one another.  Asymptomatic.   Allergy    multiple med allergies   Anemia 04/29/2020   Arthritis    left knee   Bursitis 01/24/2016   left shoulder   Chest pain 01/29/2021   See 01/29/21 ED note in Epic. Pain significantly improved w/ NTG. CBC, CMP, troponin, BNP unremarkable. EKG showed NSR w/o acute ST changes. 02/19/21 [facility] Stress test - normal.   Chest pain 01/13/2019   Troponin negative x 2, Cxray normal, EKG sinus rhythm w/ borderline T abnormality. Discharged from ER w/ instruction to f/u w/PCP.   Chronic back pain    Chronic kidney disease    Stage  3, follows w/PCP   Complication of anesthesia    Pt states that one time she was told that she was given general anesthesia too fast. She woke up covered in blotches and was given IV Benadryl per pt.   COVID-19 03/05/2021   congestion, fever, all symptoms resolved as of 05/16/21   Diabetes mellitus    Type 2   Fatty liver    Fibroid    hysteretomy in 2002   GERD (gastroesophageal reflux disease)    Patient is taking Pepcid as of 05/16/21.   Heart murmur    very long time ago per pt on 05/16/21   Hidradenitis    years ago per pt on 05/16/21   High cholesterol    follows w/ PCP Dr. Nani Gasser   Hypertension    follows with PCP Dr. Nani Gasser   Plantar fasciitis, bilateral 09/24/2020   Sleep apnea    wears CPAP   Spondylosis 2023   lumbar, cervicothoracic region with cervicalgia   Wears contact lenses    Wears glasses     Past Surgical History:  Procedure Laterality Date   BREAST EXCISIONAL BIOPSY Right 2020   BREAST SURGERY     cysts removed right breast, around 20 years ago per pt on 05/16/21   BREAST SURGERY  08/2018  benign mass   CARPAL TUNNEL RELEASE Right 01/08/2017   Procedure: CARPAL TUNNEL RELEASE, POSSIBLE RIGHT THUMB CARPOMETACARPAL INJECTION;  Surgeon: Dominica Severin, MD;  Location: [facility];  Service: Orthopedics;  Laterality: Right;  60 MINS   CESAREAN SECTION     1992 & 1994   COLPOSCOPY N/A 05/19/2021   Procedure: COLPOSCOPY;  Surgeon: Patton Salles, MD;  Location: [facility];  Service: Gynecology;  Laterality: N/A;   ESOPHAGOGASTRODUODENOSCOPY  07/19/2020   in Epic   INGUINAL HIDRADENITIS EXCISION     many years ago per pt on 05/16/21   LEEP  05/04/2013   HPV change   LEEP N/A 05/19/2021   Procedure: LOOP ELECTROSURGICAL EXCISION PROCEDURE (LEEP), ENDOCERVICAL CURETTAGE;  Surgeon: Patton Salles, MD;  Location: [facility];  Service: Gynecology;  Laterality: N/A;    SUPRACERVICAL ABDOMINAL HYSTERECTOMY  2002   Done for bleeding, fibroids in [place]    Social History   Socioeconomic History   Marital status: Divorced    Spouse name: Not on file   Number of children: Not on file   Years of education: Not on file   Highest education level: Associate degree: academic program  Occupational History   Not on file  Tobacco Use   Smoking status: Never   Smokeless tobacco: Never  Vaping Use   Vaping status: Never Used  Substance and Sexual Activity   Alcohol use: No    Alcohol/week: 0.0 standard drinks of alcohol   Drug use: No   Sexual activity: Not Currently    Partners: Male    Birth control/protection: Surgical    Comment: TAH/supracervical  Other Topics Concern   Not on file  Social History Narrative   Not on file   Social Determinants of Health   Financial Resource Strain: Low Risk  (05/18/2022)   Overall Financial Resource Strain (CARDIA)    Difficulty of Paying Living Expenses: Not hard at all  Food Insecurity: No Food Insecurity (05/18/2022)   Hunger Vital Sign    Worried About Running Out of Food in the Last Year: Never true    Ran Out of Food in the Last Year: Never true  Transportation Needs: No Transportation Needs (05/18/2022)   PRAPARE - Administrator, Civil Service (Medical): No    Lack of Transportation (Non-Medical): No  Physical Activity: Sufficiently Active (05/18/2022)   Exercise Vital Sign    Days of Exercise per Week: 7 days    Minutes of Exercise per Session: 30 min  Stress: No Stress Concern Present (05/18/2022)   [employer] of Occupational Health - Occupational Stress Questionnaire    Feeling of Stress : Not at all  Social Connections: Socially Isolated (05/18/2022)   Social Connection and Isolation Panel [NHANES]    Frequency of Communication with Friends and Family: Three times a week    Frequency of Social Gatherings with Friends and Family: Once a week    Attends Religious Services: Never     Database administrator or Organizations: No    Attends Engineer, structural: Not on file    Marital Status: Divorced    Family History  Problem Relation Age of Onset   Asthma Mother    COPD Mother    Diabetes Mother    Hypertension Mother    Diabetes Father    Hypertension Father    Breast cancer Maternal Aunt    Breast cancer Paternal Aunt  Colon cancer Neg Hx    Pancreatic cancer Neg Hx    Esophageal cancer Neg Hx    Stomach cancer Neg Hx    Liver disease Neg Hx     Health Maintenance  Topic Date Due   Zoster Vaccines- Shingrix (1 of 2) Never done   INFLUENZA VACCINE  08/27/2022   Diabetic kidney evaluation - Urine ACR  09/11/2022   COVID-19 Vaccine (4 - 2023-24 season) 09/27/2022   HEMOGLOBIN A1C  02/28/2023   FOOT EXAM  03/21/2023   OPHTHALMOLOGY EXAM  04/01/2023   Colonoscopy  07/25/2023   Diabetic kidney evaluation - eGFR measurement  08/28/2023   DTaP/Tdap/Td (3 - Td or Tdap) 04/02/2024   MAMMOGRAM  10/20/2024   Cervical Cancer Screening (HPV/Pap Cotest)  05/04/2027   Hepatitis C Screening  Completed   HIV Screening  Completed   Pneumococcal Vaccine 67-30 Years old  Aged Out   HPV VACCINES  Aged Out     ----------------------------------------------------------------------------------------------------------------------------------------------------------------------------------------------------------------- Physical Exam BP (!) 163/88 (BP Location: Left Arm, Patient Position: Sitting, Cuff Size: Normal)   Pulse 82   Ht 4' 11.5" (1.511 m)   Wt 164 lb (74.4 kg)   LMP 01/27/2000 (Approximate) Comment: supracervical hysterectomy  SpO2 97%   BMI 32.57 kg/m   Physical Exam Constitutional:      Appearance: Normal appearance.  Cardiovascular:     Rate and Rhythm: Normal rate and regular rhythm.  Abdominal:     General: There is no distension.     Tenderness: There is no abdominal tenderness. There is no right CVA tenderness.  Musculoskeletal:      Cervical back: Neck supple.  Neurological:     General: No focal deficit present.     Mental Status: She is alert.  Psychiatric:        Mood and Affect: Mood normal.        Behavior: Behavior normal.     ------------------------------------------------------------------------------------------------------------------------------------------------------------------------------------------------------------------- Assessment and Plan  Flank pain It sounds like she may have passed a stone earlier today.  Still with mild low back pain.  KUB ordered to evaluate for retained stone.  Recommend increased fluids.     No orders of the defined types were placed in this encounter.   No follow-ups on file.    This visit occurred during the SARS-CoV-2 public health emergency.  Safety protocols were in place, including screening questions prior to the visit, additional usage of staff PPE, and extensive cleaning of exam room while observing appropriate contact time as indicated for disinfecting solutions.

## 2022-12-19 LAB — URINE CULTURE: Organism ID, Bacteria: NO GROWTH

## 2022-12-28 ENCOUNTER — Ambulatory Visit: Payer: Commercial Managed Care - PPO | Admitting: Family Medicine

## 2022-12-28 ENCOUNTER — Encounter: Payer: Self-pay | Admitting: Family Medicine

## 2022-12-28 VITALS — BP 139/61 | HR 74 | Ht 59.5 in | Wt 161.0 lb

## 2022-12-28 DIAGNOSIS — N1831 Chronic kidney disease, stage 3a: Secondary | ICD-10-CM

## 2022-12-28 DIAGNOSIS — R935 Abnormal findings on diagnostic imaging of other abdominal regions, including retroperitoneum: Secondary | ICD-10-CM | POA: Diagnosis not present

## 2022-12-28 DIAGNOSIS — I10 Essential (primary) hypertension: Secondary | ICD-10-CM | POA: Diagnosis not present

## 2022-12-28 DIAGNOSIS — Z7984 Long term (current) use of oral hypoglycemic drugs: Secondary | ICD-10-CM | POA: Diagnosis not present

## 2022-12-28 DIAGNOSIS — K5900 Constipation, unspecified: Secondary | ICD-10-CM | POA: Diagnosis not present

## 2022-12-28 DIAGNOSIS — E1169 Type 2 diabetes mellitus with other specified complication: Secondary | ICD-10-CM

## 2022-12-28 LAB — POCT UA - MICROALBUMIN
Albumin/Creatinine Ratio, Urine, POC: 30
Creatinine, POC: 200 mg/dL
Microalbumin Ur, POC: 150 mg/L

## 2022-12-28 LAB — POCT GLYCOSYLATED HEMOGLOBIN (HGB A1C): Hemoglobin A1C: 7.5 % — AB (ref 4.0–5.6)

## 2022-12-28 MED ORDER — VALSARTAN 160 MG PO TABS
160.0000 mg | ORAL_TABLET | Freq: Every day | ORAL | 3 refills | Status: AC
Start: 2022-12-28 — End: 2023-11-27

## 2022-12-28 NOTE — Assessment & Plan Note (Addendum)
Due urine microalbumin test today.

## 2022-12-28 NOTE — Assessment & Plan Note (Addendum)
A1C is elevated at 7.5 today.  Discussed options.  Would will increase metformin back up to twice a day.  Consider adding second agent if not at goal really want a get her A1c back to the mid sixes if possible.  Due for urine microalbumin today.  Follow-up in 3 months.

## 2022-12-28 NOTE — Progress Notes (Signed)
Established Patient Office Visit  Subjective   Patient ID: Maria Nunez, female    DOB: July 11, 1960  Age: 62 y.o. MRN: [id number]  No chief complaint on file.   HPI   Diabetes - no hypoglycemic events. No wounds or sores that are not healing well. No increased thirst or urination. Checking glucose at home. Taking medications as prescribed without any side effects.  Hypertension- Pt denies chest pain, SOB, dizziness, or heart palpitations.  Taking meds as directed w/o problems.  Denies medication side effects.       ROS    Objective:     BP 139/61 Comment: Repeat BP  Pulse 74   Ht 4' 11.5" (1.511 m)   Wt 161 lb 0.6 oz (73 kg)   LMP 01/27/2000 (Approximate) Comment: supracervical hysterectomy  SpO2 99%   BMI 31.98 kg/m    Physical Exam Vitals and nursing note reviewed.  Constitutional:      Appearance: Normal appearance.  HENT:     Head: Normocephalic and atraumatic.  Eyes:     Conjunctiva/sclera: Conjunctivae normal.  Cardiovascular:     Rate and Rhythm: Normal rate and regular rhythm.  Pulmonary:     Effort: Pulmonary effort is normal.     Breath sounds: Normal breath sounds.  Skin:    General: Skin is warm and dry.  Neurological:     Mental Status: She is alert.  Psychiatric:        Mood and Affect: Mood normal.      Results for orders placed or performed in visit on 12/28/22  POCT HgB A1C  Result Value Ref Range   Hemoglobin A1C 7.5 (A) 4.0 - 5.6 %   HbA1c POC (<> result, manual entry)     HbA1c, POC (prediabetic range)     HbA1c, POC (controlled diabetic range)    POCT UA - Microalbumin  Result Value Ref Range   Microalbumin Ur, POC 150 mg/L   Creatinine, POC 200 mg/dL   Albumin/Creatinine Ratio, Urine, POC 30       The ASCVD Risk score (Arnett DK, et al., 2019) failed to calculate for the following reasons:   The valid total cholesterol range is 130 to 320 mg/dL    Assessment & Plan:   Problem List Items Addressed This Visit        Cardiovascular and Mediastinum   HYPERTENSION, BENIGN SYSTEMIC    Pressure elevated today it was also elevated just slightly last time.  Will increase valsartan to 160 mg.  New prescription sent to pharmacy.      Relevant Medications   valsartan (DIOVAN) 160 MG tablet     Endocrine   Type 2 diabetes mellitus with other specified complication (HCC) - Primary    A1C is elevated at 7.5 today.  Discussed options.  Would will increase metformin back up to twice a day.  Consider adding second agent if not at goal really want a get her A1c back to the mid sixes if possible.  Due for urine microalbumin today.  Follow-up in 3 months.      Relevant Medications   valsartan (DIOVAN) 160 MG tablet   Other Relevant Orders   POCT HgB A1C (Completed)   POCT UA - Microalbumin (Completed)     Genitourinary   CKD (chronic kidney disease) stage 3, GFR 30-59 ml/min (HCC)    Due urine microalbumin test today.      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  I really think this would help her feel better.  There was also a 2.8 cm area in that right upper quadrant that look calcified and perfectly circular she said she had a gown on for the film.  Plan to get an ultrasound for further workup to see if maybe it is a gallstone.  Interestingly she says her daughter just had her gallbladder removed about 2 weeks ago.  Return in about 3 months (around 03/28/2023) for Diabetes follow-up.    Nani Gasser, MD

## 2022-12-28 NOTE — Assessment & Plan Note (Signed)
Pressure elevated today it was also elevated just slightly last time.  Will increase valsartan to 160 mg.  New prescription sent to pharmacy.

## 2022-12-29 ENCOUNTER — Other Ambulatory Visit (HOSPITAL_BASED_OUTPATIENT_CLINIC_OR_DEPARTMENT_OTHER): Payer: Self-pay

## 2022-12-29 ENCOUNTER — Other Ambulatory Visit: Payer: Self-pay | Admitting: Family Medicine

## 2022-12-29 DIAGNOSIS — E1169 Type 2 diabetes mellitus with other specified complication: Secondary | ICD-10-CM

## 2022-12-29 MED ORDER — METFORMIN HCL 1000 MG PO TABS
1000.0000 mg | ORAL_TABLET | Freq: Two times a day (BID) | ORAL | 1 refills | Status: AC
Start: 2022-12-29 — End: 2024-03-19
  Filled 2022-12-29: qty 180, 90d supply, fill #0
  Filled 2023-12-20: qty 180, 90d supply, fill #1

## 2023-01-04 ENCOUNTER — Ambulatory Visit: Payer: Commercial Managed Care - PPO

## 2023-01-04 DIAGNOSIS — K8080 Other cholelithiasis without obstruction: Secondary | ICD-10-CM | POA: Diagnosis not present

## 2023-01-04 DIAGNOSIS — R935 Abnormal findings on diagnostic imaging of other abdominal regions, including retroperitoneum: Secondary | ICD-10-CM

## 2023-01-04 DIAGNOSIS — K8020 Calculus of gallbladder without cholecystitis without obstruction: Secondary | ICD-10-CM | POA: Diagnosis not present

## 2023-01-05 ENCOUNTER — Encounter: Payer: Self-pay | Admitting: Family Medicine

## 2023-01-05 NOTE — Progress Notes (Signed)
Hi Maria Nunez, they did see some gallstones but no sign of gallbladder wall irritation or thickening which is reassuring.  They also saw increased fat intake in the liver called fatty liver.  We do not typically refer for gallbladder removal unless the gallstones are actively causing a problem.  Were you able to get a good bowel cleanout and are you feeling any better?

## 2023-01-11 ENCOUNTER — Ambulatory Visit: Payer: Commercial Managed Care - PPO | Admitting: Sports Medicine

## 2023-01-11 ENCOUNTER — Other Ambulatory Visit (INDEPENDENT_AMBULATORY_CARE_PROVIDER_SITE_OTHER): Payer: Commercial Managed Care - PPO

## 2023-01-11 DIAGNOSIS — M7711 Lateral epicondylitis, right elbow: Secondary | ICD-10-CM

## 2023-01-11 DIAGNOSIS — M65331 Trigger finger, right middle finger: Secondary | ICD-10-CM | POA: Insufficient documentation

## 2023-01-11 MED ORDER — TRIAMCINOLONE ACETONIDE 40 MG/ML IJ SUSP
40.0000 mg | Freq: Once | INTRAMUSCULAR | Status: AC
Start: 2023-01-11 — End: 2023-01-11
  Administered 2023-01-11: 40 mg via INTRAMUSCULAR

## 2023-01-11 NOTE — Progress Notes (Signed)
    Procedures performed today:    Procedure: Real-time Ultrasound Guided injection of the right third flexor tendon sheath Device: Samsung [id number]  Verbal informed consent obtained.  Time-out conducted.  Noted no overlying erythema, induration, or other signs of local infection.  Skin prepped in a sterile fashion.  Local anesthesia: Topical Ethyl chloride.  With sterile technique and under real time ultrasound guidance: Noted flexor tendon nodule, 0.5 cc lidocaine, 0.5 cc kenalog 40 injected easily Completed without difficulty  Advised to call if fevers/chills, erythema, induration, drainage, or persistent bleeding.  Images permanently stored and available for review in PACS.  Impression: Technically successful ultrasound guided injection.  Independent interpretation of notes and tests performed by another provider:   None.  Brief History, Exam, Impression, and Recommendations:    Trigger finger, right middle finger 2 months of pain volar right hand close association with the third flexor compartment. Visible and palpable triggering. Topical agents not improving symptomatology. Today we did a flexor tendon sheath injection, home PT given, return in 6 weeks.  Right tennis elbow Increasing pain right elbow as well. Good motion, good strength, she does have some pain with resisted extension of the middle finger. Tennis elbow diagnosed, home conditioning given, if not better in 6 weeks we will consider injecting this as well.    ____________________________________________ Ihor Austin. Benjamin Stain, M.D., ABFM., CAQSM., AME. Primary Care and Sports Medicine  [facility]  Adjunct Professor of Family Medicine  [place] [employer] [facility] [employer]  Restaurant manager, fast food

## 2023-01-11 NOTE — Addendum Note (Signed)
Addended by: Carren Rang A on: 01/11/2023 02:21 PM   Modules accepted: Orders

## 2023-01-11 NOTE — Assessment & Plan Note (Signed)
Increasing pain right elbow as well. Good motion, good strength, she does have some pain with resisted extension of the middle finger. Tennis elbow diagnosed, home conditioning given, if not better in 6 weeks we will consider injecting this as well.

## 2023-01-11 NOTE — Assessment & Plan Note (Signed)
2 months of pain volar right hand close association with the third flexor compartment. Visible and palpable triggering. Topical agents not improving symptomatology. Today we did a flexor tendon sheath injection, home PT given, return in 6 weeks.

## 2023-02-17 DIAGNOSIS — N1831 Chronic kidney disease, stage 3a: Secondary | ICD-10-CM | POA: Diagnosis not present

## 2023-02-17 DIAGNOSIS — R809 Proteinuria, unspecified: Secondary | ICD-10-CM | POA: Diagnosis not present

## 2023-02-17 DIAGNOSIS — I129 Hypertensive chronic kidney disease with stage 1 through stage 4 chronic kidney disease, or unspecified chronic kidney disease: Secondary | ICD-10-CM | POA: Diagnosis not present

## 2023-02-17 DIAGNOSIS — E1122 Type 2 diabetes mellitus with diabetic chronic kidney disease: Secondary | ICD-10-CM | POA: Diagnosis not present

## 2023-02-22 ENCOUNTER — Other Ambulatory Visit (INDEPENDENT_AMBULATORY_CARE_PROVIDER_SITE_OTHER): Payer: Commercial Managed Care - PPO

## 2023-02-22 ENCOUNTER — Ambulatory Visit: Payer: Commercial Managed Care - PPO | Admitting: Sports Medicine

## 2023-02-22 DIAGNOSIS — M7711 Lateral epicondylitis, right elbow: Secondary | ICD-10-CM

## 2023-02-22 MED ORDER — TRIAMCINOLONE ACETONIDE 40 MG/ML IJ SUSP
40.0000 mg | Freq: Once | INTRAMUSCULAR | Status: AC
Start: 2023-02-22 — End: 2023-02-22
  Administered 2023-02-22: 40 mg via INTRAMUSCULAR

## 2023-02-22 NOTE — Progress Notes (Signed)
    Procedures performed today:    Procedure: Real-time Ultrasound Guided injection of the right common extensor tendon origin Device: Samsung HS60  Verbal informed consent obtained.  Time-out conducted.  Noted no overlying erythema, induration, or other signs of local infection.  Skin prepped in a sterile fashion.  Local anesthesia: Topical Ethyl chloride.  With sterile technique and under real time ultrasound guidance: Noted deep insertional hypoechoic change consistent with tendinopathy, 1 cc Kenalog 40, 1 cc lidocaine, 1 cc bupivacaine injected easily Completed without difficulty  Advised to call if fevers/chills, erythema, induration, drainage, or persistent bleeding.  Images permanently stored and available for review in PACS.  Impression: Technically successful ultrasound guided injection.  Independent interpretation of notes and tests performed by another provider:   None.  Brief History, Exam, Impression, and Recommendations:    Right tennis elbow Persistent pain right elbow in spite of home conditioning. Injected today, restart counterforce brace, return in 6 weeks as needed.    ____________________________________________ Ihor Austin. Benjamin Stain, M.D., ABFM., CAQSM., AME. Primary Care and Sports Medicine [place] [facility]  Adjunct Professor of Family Medicine  [employer]  Restaurant manager, fast food

## 2023-02-22 NOTE — Assessment & Plan Note (Addendum)
Persistent pain right elbow in spite of home conditioning. Injected today, restart counterforce brace, return in 6 weeks as needed.

## 2023-03-25 ENCOUNTER — Ambulatory Visit: Payer: Commercial Managed Care - PPO | Admitting: Podiatry

## 2023-03-29 ENCOUNTER — Ambulatory Visit: Payer: Commercial Managed Care - PPO | Admitting: Family Medicine

## 2023-04-05 ENCOUNTER — Ambulatory Visit: Payer: Commercial Managed Care - PPO | Admitting: Sports Medicine

## 2023-04-15 ENCOUNTER — Encounter: Payer: Self-pay | Admitting: Family Medicine

## 2023-04-15 ENCOUNTER — Ambulatory Visit: Payer: Commercial Managed Care - PPO | Admitting: Family Medicine

## 2023-04-15 VITALS — BP 128/64 | HR 79 | Ht 59.5 in | Wt 160.0 lb

## 2023-04-15 DIAGNOSIS — I10 Essential (primary) hypertension: Secondary | ICD-10-CM

## 2023-04-15 DIAGNOSIS — N1831 Chronic kidney disease, stage 3a: Secondary | ICD-10-CM | POA: Diagnosis not present

## 2023-04-15 DIAGNOSIS — Z7984 Long term (current) use of oral hypoglycemic drugs: Secondary | ICD-10-CM | POA: Diagnosis not present

## 2023-04-15 DIAGNOSIS — K219 Gastro-esophageal reflux disease without esophagitis: Secondary | ICD-10-CM

## 2023-04-15 DIAGNOSIS — E1169 Type 2 diabetes mellitus with other specified complication: Secondary | ICD-10-CM | POA: Diagnosis not present

## 2023-04-15 LAB — POCT GLYCOSYLATED HEMOGLOBIN (HGB A1C): Hemoglobin A1C: 7.1 % — AB (ref 4.0–5.6)

## 2023-04-15 MED ORDER — FAMOTIDINE 40 MG PO TABS
40.0000 mg | ORAL_TABLET | Freq: Two times a day (BID) | ORAL | 1 refills | Status: AC
  Filled 2023-09-29: qty 180, 90d supply, fill #0
  Filled 2024-01-06: qty 120, 60d supply, fill #1

## 2023-04-15 MED ORDER — ROSUVASTATIN CALCIUM 40 MG PO TABS: 40.0000 mg | ORAL_TABLET | Freq: Every day | ORAL | 3 refills | Status: DC

## 2023-04-15 NOTE — Assessment & Plan Note (Signed)
 Reports home blood pressures usually under 130 sometimes even under 120.  She is taking her medication regularly.

## 2023-04-15 NOTE — Assessment & Plan Note (Signed)
 She was able to get a consultation with nephrology and follow-up in January.  They are going to just see her yearly they felt like a lot of it was just dehydration secondary to nausea and vomiting in addition to being on valsartan HCTZ at the time.  Serum creatinine has stabilized around 1.0.  Had labs done with nephrology in January.

## 2023-04-15 NOTE — Assessment & Plan Note (Signed)
 A1c looks better today at 7.1 it was previously 7.5.  She says she has really tried hard to make some changes and do better especially with her diet.  We discussed options including going on a GLP-1 to assist her she had previously tried Victoza but had constipation and back pain.  She says that she wants to continue on her current course since she is making good progress and see how things look in 3 to 4 months and if at that point she still struggling then we may consider it.  For now continue with metformin.  Continue to work on healthy diet and regular exercise.

## 2023-04-15 NOTE — Progress Notes (Signed)
 Established Patient Office Visit  Subjective  Patient ID: Maria Nunez, female    DOB: [birth year]  Age: 63 y.o. MRN: [id number]  Chief Complaint  Patient presents with   Diabetes   Hypertension    HPI  Diabetes - no hypoglycemic events. No wounds or sores that are not healing well. No increased thirst or urination. Checking glucose at home. Taking medications as prescribed without any side effects.  Hypertension- Pt denies chest pain, SOB, dizziness, or heart palpitations.  Taking meds as directed w/o problems.  Denies medication side effects.       ROS    Objective:     BP 128/64   Pulse 79   Ht 4' 11.5" (1.511 m)   Wt 160 lb (72.6 kg)   LMP 01/27/2000 (Approximate) Comment: supracervical hysterectomy  SpO2 96%   BMI 31.78 kg/m    Physical Exam Vitals and nursing note reviewed.  Constitutional:      Appearance: Normal appearance.  HENT:     Head: Normocephalic and atraumatic.  Eyes:     Conjunctiva/sclera: Conjunctivae normal.  Cardiovascular:     Rate and Rhythm: Normal rate and regular rhythm.  Pulmonary:     Effort: Pulmonary effort is normal.     Breath sounds: Normal breath sounds.  Skin:    General: Skin is warm and dry.  Neurological:     Mental Status: She is alert.  Psychiatric:        Mood and Affect: Mood normal.      Results for orders placed or performed in visit on 04/15/23  POCT HgB A1C  Result Value Ref Range   Hemoglobin A1C 7.1 (A) 4.0 - 5.6 %   HbA1c POC (<> result, manual entry)     HbA1c, POC (prediabetic range)     HbA1c, POC (controlled diabetic range)        The ASCVD Risk score (Arnett DK, et al., 2019) failed to calculate for the following reasons:   The valid total cholesterol range is 130 to 320 mg/dL    Assessment & Plan:   Problem List Items Addressed This Visit       Cardiovascular and Mediastinum   HYPERTENSION, BENIGN SYSTEMIC - Primary   Reports home blood pressures usually under 130  sometimes even under 120.  She is taking her medication regularly.      Relevant Medications   rosuvastatin (CRESTOR) 40 MG tablet     Digestive   Gastroesophageal reflux disease without esophagitis   Relevant Medications   famotidine (PEPCID) 40 MG tablet     Endocrine   Type 2 diabetes mellitus with other specified complication (HCC)   A1c looks better today at 7.1 it was previously 7.5.  She says she has really tried hard to make some changes and do better especially with her diet.  We discussed options including going on a GLP-1 to assist her she had previously tried Victoza but had constipation and back pain.  She says that she wants to continue on her current course since she is making good progress and see how things look in 3 to 4 months and if at that point she still struggling then we may consider it.  For now continue with metformin.  Continue to work on healthy diet and regular exercise.      Relevant Medications   rosuvastatin (CRESTOR) 40 MG tablet   Other Relevant Orders   POCT HgB A1C (Completed)     Genitourinary   CKD (chronic  kidney disease) stage 3, GFR 30-59 ml/min (HCC)   She was able to get a consultation with nephrology and follow-up in January.  They are going to just see her yearly they felt like a lot of it was just dehydration secondary to nausea and vomiting in addition to being on valsartan HCTZ at the time.  Serum creatinine has stabilized around 1.0.  Had labs done with nephrology in January.       Return in about 3 months (around 07/23/2023) for Diabetes follow-up.    Nani Gasser, MD

## 2023-05-14 DIAGNOSIS — G4733 Obstructive sleep apnea (adult) (pediatric): Secondary | ICD-10-CM | POA: Diagnosis not present

## 2023-05-20 ENCOUNTER — Encounter: Payer: Self-pay | Admitting: Podiatry

## 2023-05-20 ENCOUNTER — Ambulatory Visit: Payer: Commercial Managed Care - PPO | Admitting: Podiatry

## 2023-05-20 DIAGNOSIS — L603 Nail dystrophy: Secondary | ICD-10-CM | POA: Diagnosis not present

## 2023-05-20 DIAGNOSIS — E119 Type 2 diabetes mellitus without complications: Secondary | ICD-10-CM

## 2023-05-20 DIAGNOSIS — E1169 Type 2 diabetes mellitus with other specified complication: Secondary | ICD-10-CM

## 2023-05-20 NOTE — Progress Notes (Signed)
 Subjective:  Patient ID: Maria Nunez, female    DOB: July 03, 1960,   MRN: [id number]  No chief complaint on file.   63 y.o. female presents for concern of left great toenail discoloration. Relates she noticed this back in November. Denies any pain but has concerns as she is diabetic. Her last A1c was 8.1. Here for diabetic foot exam as well.  . Denies any other pedal complaints. Denies n/v/f/c.   Past Medical History:  Diagnosis Date   Abnormal ultrasound of ovary 2016   Ultrasound showing bilateral ovaries in contact with one another.  Asymptomatic.   Allergy    multiple med allergies   Anemia 04/29/2020   Arthritis    left knee   Bursitis 01/24/2016   left shoulder   Chest pain 01/29/2021   See 01/29/21 ED note in Epic. Pain significantly improved w/ NTG. CBC, CMP, troponin, BNP unremarkable. EKG showed NSR w/o acute ST changes. 02/19/21 [facility] Stress test - normal.   Chest pain 01/13/2019   Troponin negative x 2, Cxray normal, EKG sinus rhythm w/ borderline T abnormality. Discharged from ER w/ instruction to f/u w/PCP.   Chronic back pain    Chronic kidney disease    Stage 3, follows w/PCP   Complication of anesthesia    Pt states that one time she was told that she was given general anesthesia too fast. She woke up covered in blotches and was given IV Benadryl per pt.   COVID-19 03/05/2021   congestion, fever, all symptoms resolved as of 05/16/21   Diabetes mellitus    Type 2   Fatty liver    Fibroid    hysteretomy in 2002   GERD (gastroesophageal reflux disease)    Patient is taking Pepcid  as of 05/16/21.   Heart murmur    very long time ago per pt on 05/16/21   Hidradenitis    years ago per pt on 05/16/21   High cholesterol    follows w/ PCP Dr. Duaine German   Hypertension    follows with PCP Dr. Duaine German   Plantar fasciitis, bilateral 09/24/2020   Sleep apnea    wears CPAP   Spondylosis 2023   lumbar, cervicothoracic region with  cervicalgia   Wears contact lenses    Wears glasses     Objective:  Physical Exam: Vascular: DP/PT pulses 2/4 bilateral. CFT <3 seconds. Normal hair growth on digits. No edema.  Skin. No lacerations or abrasions bilateral feet.  Nails 1-5 bilateral normal in appearance.  Musculoskeletal: MMT 5/5 bilateral lower extremities in DF, PF, Inversion and Eversion. Deceased ROM in DF of ankle joint.  Neurological: Sensation intact to light touch. Protective sensation intact.   Assessment:   1. Onychodystrophy   2. Type 2 diabetes mellitus with other specified complication, without long-term current use of insulin  (HCC)   3. Encounter for diabetic foot exam (HCC)       Plan:  Patient was evaluated and treated and all questions answered. -Discussed and educated patient on diabetic foot care, especially with  regards to the vascular, neurological and musculoskeletal systems.  -Stressed the importance of good glycemic control and the detriment of not  controlling glucose levels in relation to the foot. -Discussed supportive shoes at all times and checking feet regularly.  -Answered all patient questions -Patient to return  in 1 year for diabetic foot check.  -Patient advised to call the office if any problems or questions arise in the meantime.   Ivette Marks  Zi Newbury, DPM

## 2023-06-15 LAB — HM DIABETES EYE EXAM

## 2023-06-22 ENCOUNTER — Other Ambulatory Visit (INDEPENDENT_AMBULATORY_CARE_PROVIDER_SITE_OTHER)

## 2023-06-22 ENCOUNTER — Ambulatory Visit: Admitting: Sports Medicine

## 2023-06-22 ENCOUNTER — Ambulatory Visit

## 2023-06-22 VITALS — BP 163/84 | HR 77

## 2023-06-22 DIAGNOSIS — M170 Bilateral primary osteoarthritis of knee: Secondary | ICD-10-CM

## 2023-06-22 DIAGNOSIS — M25561 Pain in right knee: Secondary | ICD-10-CM | POA: Diagnosis not present

## 2023-06-22 DIAGNOSIS — M25562 Pain in left knee: Secondary | ICD-10-CM | POA: Diagnosis not present

## 2023-06-22 DIAGNOSIS — M25461 Effusion, right knee: Secondary | ICD-10-CM | POA: Diagnosis not present

## 2023-06-22 DIAGNOSIS — H8110 Benign paroxysmal vertigo, unspecified ear: Secondary | ICD-10-CM | POA: Diagnosis not present

## 2023-06-22 DIAGNOSIS — M25462 Effusion, left knee: Secondary | ICD-10-CM

## 2023-06-22 DIAGNOSIS — M7989 Other specified soft tissue disorders: Secondary | ICD-10-CM | POA: Diagnosis not present

## 2023-06-22 MED ORDER — TRIAMCINOLONE ACETONIDE 40 MG/ML IJ SUSP
40.0000 mg | Freq: Once | INTRAMUSCULAR | Status: AC
  Administered 2023-06-22: 40 mg via INTRA_ARTICULAR

## 2023-06-22 NOTE — Addendum Note (Signed)
 Addended by: Montgomery Apgar on: 06/22/2023 03:49 PM   Modules accepted: Orders

## 2023-06-22 NOTE — Assessment & Plan Note (Signed)
 Recurrence of pain right knee with moderate swelling. Celebrex  not entirely effective. Requesting aggressive interventional treatment today, we did a right knee joint injection, return to see me 6 weeks.

## 2023-06-22 NOTE — Assessment & Plan Note (Signed)
 Getting some vertiginous symptoms with changes in head position, no changes in hearing, no visual changes, no tinnitus. We will have her do some vestibular rehab and follow this up with her primary care provider.

## 2023-06-22 NOTE — Progress Notes (Signed)
    Procedures performed today:    Procedure: Real-time Ultrasound Guided injection of the right knee Device: Samsung HS60  Verbal informed consent obtained.  Time-out conducted.  Noted no overlying erythema, induration, or other signs of local infection.  Skin prepped in a sterile fashion.  Local anesthesia: Topical Ethyl chloride.  With sterile technique and under real time ultrasound guidance: Mild effusion noted, 1 cc Kenalog  40, 2 cc lidocaine , 2 cc bupivacaine  injected easily Completed without difficulty  Advised to call if fevers/chills, erythema, induration, drainage, or persistent bleeding.  Images permanently stored and available for review in PACS.  Impression: Technically successful ultrasound guided injection.  Independent interpretation of notes and tests performed by another provider:   None.  Brief History, Exam, Impression, and Recommendations:    Primary osteoarthritis of both knees Recurrence of pain right knee with moderate swelling. Celebrex  not entirely effective. Requesting aggressive interventional treatment today, we did a right knee joint injection, return to see me 6 weeks.  Benign paroxysmal positional vertigo Getting some vertiginous symptoms with changes in head position, no changes in hearing, no visual changes, no tinnitus. We will have her do some vestibular rehab and follow this up with her primary care provider.    ____________________________________________ Joselyn Nicely. Sandy Crumb, M.D., ABFM., CAQSM., AME. Primary Care and Sports Medicine [place] [facility]  Adjunct Professor of [facility] Medicine  [employer]  Restaurant manager, fast food

## 2023-06-25 ENCOUNTER — Ambulatory Visit: Attending: Sports Medicine | Admitting: Rehabilitative and Restorative Service Providers"

## 2023-06-25 ENCOUNTER — Other Ambulatory Visit: Payer: Self-pay

## 2023-06-25 DIAGNOSIS — H8111 Benign paroxysmal vertigo, right ear: Secondary | ICD-10-CM | POA: Insufficient documentation

## 2023-06-25 DIAGNOSIS — R2681 Unsteadiness on feet: Secondary | ICD-10-CM | POA: Diagnosis present

## 2023-06-25 DIAGNOSIS — H8110 Benign paroxysmal vertigo, unspecified ear: Secondary | ICD-10-CM | POA: Insufficient documentation

## 2023-06-25 DIAGNOSIS — R42 Dizziness and giddiness: Secondary | ICD-10-CM | POA: Insufficient documentation

## 2023-06-25 NOTE — Therapy (Signed)
 OUTPATIENT PHYSICAL THERAPY VESTIBULAR EVALUATION   Patient Name: Maria Nunez MRN: [id number] DOB:09-Mar-1960, 63 y.o., female Today's Date: 06/25/2023  END OF SESSION:  PT End of Session - 06/25/23 1025     Visit Number 1    Number of Visits 8    Date for PT Re-Evaluation 07/25/23    Authorization Type [place] choice plan    PT Start Time 1020    PT Stop Time 1105    PT Time Calculation (min) 45 min    Activity Tolerance Patient tolerated treatment well    Behavior During Therapy [facility] for tasks assessed/performed            Past Medical History:  Diagnosis Date   Abnormal ultrasound of ovary 2016   Ultrasound showing bilateral ovaries in contact with one another.  Asymptomatic.   Allergy    multiple med allergies   Anemia 04/29/2020   Arthritis    left knee   Bursitis 01/24/2016   left shoulder   Chest pain 01/29/2021   See 01/29/21 ED note in Epic. Pain significantly improved w/ NTG. CBC, CMP, troponin, BNP unremarkable. EKG showed NSR w/o acute ST changes. 02/19/21 [facility] Stress test - normal.   Chest pain 01/13/2019   Troponin negative x 2, Cxray normal, EKG sinus rhythm w/ borderline T abnormality. Discharged from ER w/ instruction to f/u w/PCP.   Chronic back pain    Chronic kidney disease    Stage 3, follows w/PCP   Complication of anesthesia    Pt states that one time she was told that she was given general anesthesia too fast. She woke up covered in blotches and was given IV Benadryl per pt.   COVID-19 03/05/2021   congestion, fever, all symptoms resolved as of 05/16/21   Diabetes mellitus    Type 2   Fatty liver    Fibroid    hysteretomy in 2002   GERD (gastroesophageal reflux disease)    Patient is taking Pepcid  as of 05/16/21.   Heart murmur    very long time ago per pt on 05/16/21   Hidradenitis    years ago per pt on 05/16/21   High cholesterol    follows w/ PCP Dr. Duaine German   Hypertension    follows with PCP Dr.  Duaine German   Plantar fasciitis, bilateral 09/24/2020   Sleep apnea    wears CPAP   Spondylosis 2023   lumbar, cervicothoracic region with cervicalgia   Wears contact lenses    Wears glasses    Past Surgical History:  Procedure Laterality Date   BREAST EXCISIONAL BIOPSY Right 2020   BREAST SURGERY     cysts removed right breast, around 20 years ago per pt on 05/16/21   BREAST SURGERY  08/2018   benign mass   CARPAL TUNNEL RELEASE Right 01/08/2017   Procedure: CARPAL TUNNEL RELEASE, POSSIBLE RIGHT THUMB CARPOMETACARPAL INJECTION;  Surgeon: Ronn Cohn, MD;  Location: [facility];  Service: Orthopedics;  Laterality: Right;  60 MINS   CESAREAN SECTION     1992 & 1994   COLPOSCOPY N/A 05/19/2021   Procedure: COLPOSCOPY;  Surgeon: Greta Leatherwood, MD;  Location: [facility];  Service: Gynecology;  Laterality: N/A;   ESOPHAGOGASTRODUODENOSCOPY  07/19/2020   in Epic   INGUINAL HIDRADENITIS EXCISION     many years ago per pt on 05/16/21   LEEP  05/04/2013   HPV change   LEEP N/A 05/19/2021  Procedure: LOOP ELECTROSURGICAL EXCISION PROCEDURE (LEEP), ENDOCERVICAL CURETTAGE;  Surgeon: Greta Leatherwood, MD;  Location: [facility];  Service: Gynecology;  Laterality: N/A;   SUPRACERVICAL ABDOMINAL HYSTERECTOMY  2002   Done for bleeding, fibroids in [place]   Patient Active Problem List   Diagnosis Date Noted   Benign paroxysmal positional vertigo 06/22/2023   Trigger finger, right middle finger 01/11/2023   Flank pain 12/17/2022   Lumbar degenerative disc disease 11/28/2021   Nocturnal leg cramps 06/13/2021   CKD (chronic kidney disease) stage 3, GFR 30-59 ml/min (HCC) 06/26/2020   Morbid obesity (HCC) 05/30/2020   Gastroesophageal reflux disease without esophagitis 05/16/2020   Atypical chest pain 05/16/2020   Normocytic anemia 05/08/2020   Fatty liver 05/01/2020   Decreased appetite 04/22/2020   OSA (obstructive  sleep apnea) 02/08/2020   Plantar fasciitis, bilateral 09/19/2019   Pain in right hand 02/21/2018   Bursitis of left shoulder 01/24/2016   GAD (generalized anxiety disorder) 04/02/2015   Localized primary osteoarthritis of carpometacarpal joint of right thumb 03/15/2015   Carpal tunnel syndrome, bilateral 04/05/2013   Right tennis elbow 03/24/2013   LGSIL (low grade squamous intraepithelial dysplasia) 06/16/2012   Type 2 diabetes mellitus with other specified complication (HCC) 07/02/2009   PANIC ATTACK 09/07/2008   RESTLESS LEG SYNDROME 04/02/2008   POLYARTHRITIS 04/02/2008   Primary osteoarthritis of both knees 01/02/2008   HOT FLASHES 08/04/2006   Hyperlipidemia associated with type 2 diabetes mellitus (HCC) 11/03/2005   HYPERTENSION, BENIGN SYSTEMIC 11/03/2005    PCP: Duaine German, MD REFERRING PROVIDER: Annemarie Kil REFERRING DIAG: H81.10 (ICD-10-CM) - Benign paroxysmal positional vertigo, unspecified laterality  THERAPY DIAG:  Dizziness and giddiness  Unsteadiness on feet  BPPV (benign paroxysmal positional vertigo), right  ONSET DATE: 06/22/23  Rationale for Evaluation and Treatment: Rehabilitation  SUBJECTIVE:  SUBJECTIVE STATEMENT: The patient reports an intermittent sensation of spinning vertigo months ago when getting back in bed in the morning (after letting her dog out)  She did not have ongoing symptoms. This week she had a severe episode when lying back to get her eyebrows waxed. She had to wait minutes before driving and notes imbalance after the episode. She was feelign mild unsteadiness today. Pt accompanied by: self  PERTINENT HISTORY: HTN, diabetes  PAIN:  Are you having pain? No  PRECAUTIONS: None  WEIGHT BEARING RESTRICTIONS: No  FALLS: Has patient fallen in last 6 months? No  LIVING ENVIRONMENT: Lives with: lives alone Lives in: House/apartment Stairs: No  PLOF: Independent, full time employment (IT department)  PATIENT  GOALS: reduce spinning episodes  OBJECTIVE:  Note: Objective measures were completed at Evaluation unless otherwise noted. COGNITION: Overall cognitive status: Within functional limits for tasks assessed   SENSATION: WFL  POSTURE:  No Significant postural limitations  Cervical ROM:  WFLs  GAIT: Gait pattern: WFL  FUNCTIONAL TESTS:  To assess multi-sensory balance  PATIENT SURVEYS:  None today (may do DHI or patient functional scale)  VESTIBULAR ASSESSMENT: SYMPTOM BEHAVIOR:  Subjective history: intermittent episodes of spinning  Non-Vestibular symptoms: tinnitus  Type of dizziness: Spinning/Vertigo and Unsteady with head/body turns  Frequency: intermittent  Duration: minutes  Aggravating factors: positional changes (lying down/lying back), quick head movements  Relieving factors: head stationary  Progression of symptoms: better  OCULOMOTOR EXAM:  Ocular Alignment: normal  Ocular ROM: No Limitations  Spontaneous Nystagmus: absent  Gaze-Induced Nystagmus: absent  Smooth Pursuits: intact  Saccades: intact   VESTIBULAR - OCULAR REFLEX:   Slow VOR: Comment:  provokes mild dizziness with difficyulty maintaining gaze  VOR Cancellation: Normal  Head-Impulse Test: HIT Right: positive HIT Left: negative  R side very pronounced slow to correct refixation saccade   Dynamic Visual Acuity: TBA   POSITIONAL TESTING: Right Dix-Hallpike: no nystagmus; vague sense of dizziness rated 5/10 Left Dix-Hallpike: no nystagmus; vague sense of dizziness rated 3/10 Right Roll Test: no nystagmus; no dizziness Left Roll Test: no nystagmus; no dizziness Right Sidelying: no nystagmus; dizziness rated 3/10 Left Sidelying: no nystagmus *Return from R dix hallpike provokes spinning sensation and 7/10 dizziness   MOTION SENSITIVITY: TBA   Motion Sensitivity Quotient Intensity: 0 = none, 1 = Lightheaded, 2 = Mild, 3 = Moderate, 4 = Severe, 5 = Vomiting  Intensity  1. Sitting to supine   2.  Supine to L side   3. Supine to R side   4. Supine to sitting   5. L Hallpike-Dix   6. Up from L    7. R Hallpike-Dix   8. Up from R    9. Sitting, head tipped to L knee   10. Head up from L knee   11. Sitting, head tipped to R knee   12. Head up from R knee   13. Sitting head turns x5   14.Sitting head nods x5   15. In stance, 180 turn to L    16. In stance, 180 turn to R                                                                                                                               [facility] Adult PT Treatment:                                                DATE: 06/25/23 Neuromuscular re-ed: Gaze adaptation Seated x 1 horizontal viewing x 20 seconds with cues on speed-- this does provoke 5/10 dizziness Habituation Thera Flakes daroff habituation x 2 reps Patient gets significant dizziness 5/10 that takes >3 minutes to resolve when returning to sit from each side Canolith repositioning: R epley's maneuver due to symptomatic reports with R positional testing Self Care: Discussed treatment plan for PT and goals of care  PATIENT EDUCATION: Education details: HEP, nature of condition, goals of rehab Person educated: Patient Education method: Explanation, Demonstration, and Handouts Education comprehension: verbalized understanding and returned demonstration  HOME EXERCISE PROGRAM: Access Code: [email or web address] URL: [email or web address] Date: 06/25/2023 Prepared by: Trygve Gage  Exercises - Brandt-Daroff Vestibular Exercise  - 2 x daily - 7 x weekly - 1 sets - 2-3 reps - Seated Gaze Stabilization with Head Rotation  - 2 x daily - 7 x weekly - 1 sets - 2 reps - 30 seconds hold  GOALS: Goals reviewed with patient? Yes  LONG TERM GOALS: Target date: 07/26/23  The patient will  be indep with HEP. Baseline:  initiated at eval Goal status: INITIAL  2.  The patient will have no dizziness with bilateral dix hallpike and bilateral sidelying tests. Baseline:   5/10 Goal status: INITIAL  3.  The patient will tolerate gaze x 1 viewing x 60 seconds without dizziness. Baseline: 5/10 dizziness x 30 seconds Goal status: INITIAL  4.  The patient will be further assessed on balance and goal to follow, if appropriate. Baseline:  TBA Goal status: INITIAL  ASSESSMENT: CLINICAL IMPRESSION: Patient is a 63 y.o. female who was seen today for physical therapy evaluation and treatment for dizziness and unsteadiness. She presents with subjective reports of dizziness (no nystagmus viewed in room light) worse with R dix hallpike, and return to sitting. She also has + R head impulse testing indicating dec'd use of VOR, as well as motion sensitivity. PT initiated HEP for habituation and gaze and also treated with Epley's maneuver today. Plan to treat to ensure resolution of spinning sensation and unsteadiness.    OBJECTIVE IMPAIRMENTS: decreased activity tolerance and dizziness.   ACTIVITY LIMITATIONS: bed mobility  PARTICIPATION LIMITATIONS: quick movements limited  PERSONAL FACTORS: 1-2 comorbidities: HTN and diabetes are also affecting patient's functional outcome.   REHAB POTENTIAL: Good  CLINICAL DECISION MAKING: Evolving/moderate complexity  EVALUATION COMPLEXITY: Moderate  PLAN:  PT FREQUENCY: 1-2x/week  PT DURATION: 4 weeks  PLANNED INTERVENTIONS: [id number]- PT Re-evaluation, 97110-Therapeutic exercises, 97530- Therapeutic activity, [id number]- Neuromuscular re-education, 97535- Self Care, [id number]- Gait training, [phone]- Canalith repositioning, Patient/Family education, Balance training, Vestibular training, and Visual/preceptual remediation/compensation  PLAN FOR NEXT SESSION: review HEP, recheck BPPV, check multi-sensory balance.   Samik Balkcom, PT 06/25/2023, 2:20 PM

## 2023-06-30 ENCOUNTER — Encounter: Payer: Self-pay | Admitting: Rehabilitative and Restorative Service Providers"

## 2023-06-30 ENCOUNTER — Ambulatory Visit: Attending: Sports Medicine | Admitting: Rehabilitative and Restorative Service Providers"

## 2023-06-30 DIAGNOSIS — H8111 Benign paroxysmal vertigo, right ear: Secondary | ICD-10-CM | POA: Diagnosis present

## 2023-06-30 DIAGNOSIS — R2681 Unsteadiness on feet: Secondary | ICD-10-CM | POA: Diagnosis present

## 2023-06-30 DIAGNOSIS — R42 Dizziness and giddiness: Secondary | ICD-10-CM | POA: Diagnosis present

## 2023-06-30 NOTE — Therapy (Signed)
 OUTPATIENT PHYSICAL THERAPY VESTIBULAR TREATMENT   Patient Name: Maria Nunez MRN: [id number] DOB:[birth year], 63 y.o., female Today's Date: 06/30/2023  END OF SESSION:  PT End of Session - 06/30/23 1315     Visit Number 2    Number of Visits 8    Date for PT Re-Evaluation 07/25/23    Authorization Type [place] choice plan    PT Start Time 1316    PT Stop Time 1400    PT Time Calculation (min) 44 min    Activity Tolerance Patient tolerated treatment well    Behavior During Therapy [facility] for tasks assessed/performed            Past Medical History:  Diagnosis Date   Abnormal ultrasound of ovary 2016   Ultrasound showing bilateral ovaries in contact with one another.  Asymptomatic.   Allergy    multiple med allergies   Anemia 04/29/2020   Arthritis    left knee   Bursitis 01/24/2016   left shoulder   Chest pain 01/29/2021   See 01/29/21 ED note in Epic. Pain significantly improved w/ NTG. CBC, CMP, troponin, BNP unremarkable. EKG showed NSR w/o acute ST changes. 02/19/21 [facility] Stress test - normal.   Chest pain 01/13/2019   Troponin negative x 2, Cxray normal, EKG sinus rhythm w/ borderline T abnormality. Discharged from ER w/ instruction to f/u w/PCP.   Chronic back pain    Chronic kidney disease    Stage 3, follows w/PCP   Complication of anesthesia    Pt states that one time she was told that she was given general anesthesia too fast. She woke up covered in blotches and was given IV Benadryl per pt.   COVID-19 03/05/2021   congestion, fever, all symptoms resolved as of 05/16/21   Diabetes mellitus    Type 2   Fatty liver    Fibroid    hysteretomy in 2002   GERD (gastroesophageal reflux disease)    Patient is taking Pepcid  as of 05/16/21.   Heart murmur    very long time ago per pt on 05/16/21   Hidradenitis    years ago per pt on 05/16/21   High cholesterol    follows w/ PCP Dr. Duaine German   Hypertension    follows with PCP Dr.  Duaine German   Plantar fasciitis, bilateral 09/24/2020   Sleep apnea    wears CPAP   Spondylosis 2023   lumbar, cervicothoracic region with cervicalgia   Wears contact lenses    Wears glasses    Past Surgical History:  Procedure Laterality Date   BREAST EXCISIONAL BIOPSY Right 2020   BREAST SURGERY     cysts removed right breast, around 20 years ago per pt on 05/16/21   BREAST SURGERY  08/2018   benign mass   CARPAL TUNNEL RELEASE Right 01/08/2017   Procedure: CARPAL TUNNEL RELEASE, POSSIBLE RIGHT THUMB CARPOMETACARPAL INJECTION;  Surgeon: Ronn Cohn, MD;  Location: [facility];  Service: Orthopedics;  Laterality: Right;  60 MINS   CESAREAN SECTION     1992 & 1994   COLPOSCOPY N/A 05/19/2021   Procedure: COLPOSCOPY;  Surgeon: Greta Leatherwood, MD;  Location: [facility];  Service: Gynecology;  Laterality: N/A;   ESOPHAGOGASTRODUODENOSCOPY  07/19/2020   in Epic   INGUINAL HIDRADENITIS EXCISION     many years ago per pt on 05/16/21   LEEP  05/04/2013   HPV change   LEEP N/A 05/19/2021  Procedure: LOOP ELECTROSURGICAL EXCISION PROCEDURE (LEEP), ENDOCERVICAL CURETTAGE;  Surgeon: Greta Leatherwood, MD;  Location: [facility];  Service: Gynecology;  Laterality: N/A;   SUPRACERVICAL ABDOMINAL HYSTERECTOMY  2002   Done for bleeding, fibroids in [place]   Patient Active Problem List   Diagnosis Date Noted   Benign paroxysmal positional vertigo 06/22/2023   Trigger finger, right middle finger 01/11/2023   Flank pain 12/17/2022   Lumbar degenerative disc disease 11/28/2021   Nocturnal leg cramps 06/13/2021   CKD (chronic kidney disease) stage 3, GFR 30-59 ml/min (HCC) 06/26/2020   Morbid obesity (HCC) 05/30/2020   Gastroesophageal reflux disease without esophagitis 05/16/2020   Atypical chest pain 05/16/2020   Normocytic anemia 05/08/2020   Fatty liver 05/01/2020   Decreased appetite 04/22/2020   OSA (obstructive  sleep apnea) 02/08/2020   Plantar fasciitis, bilateral 09/19/2019   Pain in right hand 02/21/2018   Bursitis of left shoulder 01/24/2016   GAD (generalized anxiety disorder) 04/02/2015   Localized primary osteoarthritis of carpometacarpal joint of right thumb 03/15/2015   Carpal tunnel syndrome, bilateral 04/05/2013   Right tennis elbow 03/24/2013   LGSIL (low grade squamous intraepithelial dysplasia) 06/16/2012   Type 2 diabetes mellitus with other specified complication (HCC) 07/02/2009   PANIC ATTACK 09/07/2008   RESTLESS LEG SYNDROME 04/02/2008   POLYARTHRITIS 04/02/2008   Primary osteoarthritis of both knees 01/02/2008   HOT FLASHES 08/04/2006   Hyperlipidemia associated with type 2 diabetes mellitus (HCC) 11/03/2005   HYPERTENSION, BENIGN SYSTEMIC 11/03/2005    PCP: Duaine German, MD REFERRING PROVIDER: Annemarie Kil REFERRING DIAG: H81.10 (ICD-10-CM) - Benign paroxysmal positional vertigo, unspecified laterality  THERAPY DIAG:  Dizziness and giddiness  Unsteadiness on feet  BPPV (benign paroxysmal positional vertigo), right  ONSET DATE: 06/22/23  Rationale for Evaluation and Treatment: Rehabilitation  SUBJECTIVE:  SUBJECTIVE STATEMENT: The patient reports dizzy one morning earlier this week and also had some leg weakness. We discussed taking her blood pressure during the next episode. She is noting a small amount of lightheadedness today. She has not had any spinning episodes.   EVAL: The patient reports an intermittent sensation of spinning vertigo months ago when getting back in bed in the morning (after letting her dog out)  She did not have ongoing symptoms. This week she had a severe episode when lying back to get her eyebrows waxed. She had to wait minutes before driving and notes imbalance after the episode. She was feelign mild unsteadiness today. Pt accompanied by: self  PERTINENT HISTORY: HTN, diabetes  PAIN:  Are you having pain?  No  PRECAUTIONS: None  WEIGHT BEARING RESTRICTIONS: No  FALLS: Has patient fallen in last 6 months? No  LIVING ENVIRONMENT: Lives with: lives alone Lives in: House/apartment Stairs: No  PLOF: Independent, full time employment (IT department)  PATIENT GOALS: reduce spinning episodes  OBJECTIVE:  Note: Objective measures were completed at Evaluation unless otherwise noted. COGNITION: Overall cognitive status: Within functional limits for tasks assessed   SENSATION: WFL  POSTURE:  No Significant postural limitations  Cervical ROM:  WFLs  GAIT: Gait pattern: WFL  FUNCTIONAL TESTS:  To assess multi-sensory balance  PATIENT SURVEYS:  None today (may do DHI or patient functional scale)  VESTIBULAR ASSESSMENT: SYMPTOM BEHAVIOR:  Subjective history: intermittent episodes of spinning  Non-Vestibular symptoms: tinnitus  Type of dizziness: Spinning/Vertigo and Unsteady with head/body turns  Frequency: intermittent  Duration: minutes  Aggravating factors: positional changes (lying down/lying back), quick head movements  Relieving factors: head  stationary  Progression of symptoms: better  OCULOMOTOR EXAM:  Ocular Alignment: normal  Ocular ROM: No Limitations  Spontaneous Nystagmus: absent  Gaze-Induced Nystagmus: absent  Smooth Pursuits: intact  Saccades: intact   VESTIBULAR - OCULAR REFLEX:   Slow VOR: Comment: provokes mild dizziness with difficyulty maintaining gaze  VOR Cancellation: Normal  Head-Impulse Test: HIT Right: positive HIT Left: negative  R side very pronounced slow to correct refixation saccade   Dynamic Visual Acuity: TBA   POSITIONAL TESTING: Right Dix-Hallpike: no nystagmus; vague sense of dizziness rated 5/10 Left Dix-Hallpike: no nystagmus; vague sense of dizziness rated 3/10 Right Roll Test: no nystagmus; no dizziness Left Roll Test: no nystagmus; no dizziness Right Sidelying: no nystagmus; dizziness rated 3/10 Left Sidelying: no  nystagmus *Return from R dix hallpike provokes spinning sensation and 7/10 dizziness   MOTION SENSITIVITY: TBA   Motion Sensitivity Quotient Intensity: 0 = none, 1 = Lightheaded, 2 = Mild, 3 = Moderate, 4 = Severe, 5 = Vomiting  Intensity  1. Sitting to supine   2. Supine to L side   3. Supine to R side   4. Supine to sitting   5. L Hallpike-Dix   6. Up from L    7. R Hallpike-Dix   8. Up from R    9. Sitting, head tipped to L knee   10. Head up from L knee   11. Sitting, head tipped to R knee   12. Head up from R knee   13. Sitting head turns x5   14.Sitting head nods x5   15. In stance, 180 turn to L    16. In stance, 180 turn to R      [facility] Adult PT Treatment:                                                DATE: 06/30/23 Therapeutic Exercise: MMT: HIP FLEXOR 3+/5 bilatrally, KNEE FLEXION: 5/5 bilat, KNEE EXTENSION 5/5 bilat; ANKLE DF: 4/5 Heel/toe raises x 10 reps Theraband green x 10 reps hip flexor strengthening Neuromuscular re-ed: Multi-sensory balance--- foam eyes open 30 seconds/eyes closed 30 seconds Tandem eyes open Feet together eyes closed + horizontal head motion Bilateral sidelying test (-) to the left and right, but mild sensation with return to sit from left side Bilateral dix hallpike Bilateral horizontal roll test Gaze x 1 adaptation  X 30 seconds Seated Horizontal plane Increased speed seated -- hard to keep focus Transitioned to standing x 30 seconds Canolith repositioning: R epley's maneuver due to symptomatic reports with R positional testing BP=132/77 (has not taken BP meds yet)                                                                                                                     [facility] Adult PT Treatment:  DATE: 06/25/23 Neuromuscular re-ed: Gaze adaptation Seated x 1 horizontal viewing x 20 seconds with cues on speed-- this does provoke 5/10 dizziness Habituation Brandt daroff  habituation x 2 reps Patient gets significant dizziness 5/10 that takes >3 minutes to resolve when returning to sit from each side Canolith repositioning: R epley's maneuver due to symptomatic reports with R positional testing Self Care: Discussed treatment plan for PT and goals of care  PATIENT EDUCATION: Education details: HEP, nature of condition, goals of rehab Person educated: Patient Education method: Explanation, Demonstration, and Handouts Education comprehension: verbalized understanding and returned demonstration  HOME EXERCISE PROGRAM: Access Code: [email or web address] URL: [email or web address] Date: 06/30/2023 Prepared by: Trygve Gage  Exercises - Brandt-Daroff Vestibular Exercise  - 2 x daily - 7 x weekly - 1 sets - 2-3 reps - Standing Gaze Stabilization with Head Rotation  - 2 x daily - 7 x weekly - 1 sets - 2 reps - 30 seconds hold - Heel Toe Raises with Counter Support  - 1 x daily - 5 x weekly - 1 sets - 10-15 reps - Standing Repeated Hip Flexion with Resistance  - 1 x daily - 5 x weekly - 1 sets - 10-15 reps  GOALS: Goals reviewed with patient? Yes  LONG TERM GOALS: Target date: 07/26/23  The patient will be indep with HEP. Baseline:  initiated at eval Goal status: INITIAL  2.  The patient will have no dizziness with bilateral dix hallpike and bilateral sidelying tests. Baseline:  5/10 Goal status: INITIAL  3.  The patient will tolerate gaze x 1 viewing x 60 seconds without dizziness. Baseline: 5/10 dizziness x 30 seconds Goal status: INITIAL  4.  The patient will be further assessed on balance and goal to follow, if appropriate. Baseline:  TBA Goal status: MET  ASSESSMENT: CLINICAL IMPRESSION: The patient continues with a lightheaded sensation only to the right side with dix hallpike and return to from R with sidelying. PT continuing to progress to LTGs with goal # 4 met toay-- patient tolerated balance assessment with no significant deficits  oted today.  EVAL: Patient is a 63 y.o. female who was seen today for physical therapy evaluation and treatment for dizziness and unsteadiness. She presents with subjective reports of dizziness (no nystagmus viewed in room light) worse with R dix hallpike, and return to sitting. She also has + R head impulse testing indicating dec'd use of VOR, as well as motion sensitivity. PT initiated HEP for habituation and gaze and also treated with Epley's maneuver today. Plan to treat to ensure resolution of spinning sensation and unsteadiness.    OBJECTIVE IMPAIRMENTS: decreased activity tolerance and dizziness.   PLAN:  PT FREQUENCY: 1-2x/week  PT DURATION: 4 weeks  PLANNED INTERVENTIONS: [id number]- PT Re-evaluation, 97110-Therapeutic exercises, 97530- Therapeutic activity, [id number]- Neuromuscular re-education, [phone]- Self Care, [id number]- Gait training, [phone]- Canalith repositioning, Patient/Family education, Balance training, Vestibular training, and Visual/preceptual remediation/compensation  PLAN FOR NEXT SESSION: review HEP, recheck BPPV, progress gaze, habituation program.   Kachina Niederer, PT 06/30/2023, 1:16 PM

## 2023-07-01 ENCOUNTER — Ambulatory Visit: Payer: Self-pay | Admitting: Sports Medicine

## 2023-07-07 ENCOUNTER — Ambulatory Visit: Admitting: Rehabilitative and Restorative Service Providers"

## 2023-07-07 ENCOUNTER — Encounter: Payer: Self-pay | Admitting: Rehabilitative and Restorative Service Providers"

## 2023-07-07 DIAGNOSIS — R2681 Unsteadiness on feet: Secondary | ICD-10-CM

## 2023-07-07 DIAGNOSIS — R42 Dizziness and giddiness: Secondary | ICD-10-CM | POA: Diagnosis not present

## 2023-07-07 DIAGNOSIS — H8111 Benign paroxysmal vertigo, right ear: Secondary | ICD-10-CM

## 2023-07-07 NOTE — Therapy (Signed)
 OUTPATIENT PHYSICAL THERAPY VESTIBULAR TREATMENT and DISCHARGE SUMMARY   Patient Name: Maria Nunez MRN: [id number] DOB:[birth year], 63 y.o., female Today's Date: 07/07/2023  END OF SESSION:  PT End of Session - 07/07/23 1446     Visit Number 3    Number of Visits 8    Date for PT Re-Evaluation 07/25/23    Authorization Type [employer] choice plan    PT Start Time 1446    PT Stop Time 1510    PT Time Calculation (min) 24 min    Activity Tolerance Patient tolerated treatment well    Behavior During Therapy [facility] for tasks assessed/performed             Past Medical History:  Diagnosis Date   Abnormal ultrasound of ovary 2016   Ultrasound showing bilateral ovaries in contact with one another.  Asymptomatic.   Allergy    multiple med allergies   Anemia 04/29/2020   Arthritis    left knee   Bursitis 01/24/2016   left shoulder   Chest pain 01/29/2021   See 01/29/21 ED note in Epic. Pain significantly improved w/ NTG. CBC, CMP, troponin, BNP unremarkable. EKG showed NSR w/o acute ST changes. 02/19/21 [facility] Stress test - normal.   Chest pain 01/13/2019   Troponin negative x 2, Cxray normal, EKG sinus rhythm w/ borderline T abnormality. Discharged from ER w/ instruction to f/u w/PCP.   Chronic back pain    Chronic kidney disease    Stage 3, follows w/PCP   Complication of anesthesia    Pt states that one time she was told that she was given general anesthesia too fast. She woke up covered in blotches and was given IV Benadryl per pt.   COVID-19 03/05/2021   congestion, fever, all symptoms resolved as of 05/16/21   Diabetes mellitus    Type 2   Fatty liver    Fibroid    hysteretomy in 2002   GERD (gastroesophageal reflux disease)    Patient is taking Pepcid  as of 05/16/21.   Heart murmur    very long time ago per pt on 05/16/21   Hidradenitis    years ago per pt on 05/16/21   High cholesterol    follows w/ PCP Dr. Duaine German   Hypertension     follows with PCP Dr. Duaine German   Plantar fasciitis, bilateral 09/24/2020   Sleep apnea    wears CPAP   Spondylosis 2023   lumbar, cervicothoracic region with cervicalgia   Wears contact lenses    Wears glasses    Past Surgical History:  Procedure Laterality Date   BREAST EXCISIONAL BIOPSY Right 2020   BREAST SURGERY     cysts removed right breast, around 20 years ago per pt on 05/16/21   BREAST SURGERY  08/2018   benign mass   CARPAL TUNNEL RELEASE Right 01/08/2017   Procedure: CARPAL TUNNEL RELEASE, POSSIBLE RIGHT THUMB CARPOMETACARPAL INJECTION;  Surgeon: Ronn Cohn, MD;  Location: [facility];  Service: Orthopedics;  Laterality: Right;  60 MINS   CESAREAN SECTION     1992 & 1994   COLPOSCOPY N/A 05/19/2021   Procedure: COLPOSCOPY;  Surgeon: Greta Leatherwood, MD;  Location: [facility];  Service: Gynecology;  Laterality: N/A;   ESOPHAGOGASTRODUODENOSCOPY  07/19/2020   in Epic   INGUINAL HIDRADENITIS EXCISION     many years ago per pt on 05/16/21   LEEP  05/04/2013   HPV change  LEEP N/A 05/19/2021   Procedure: LOOP ELECTROSURGICAL EXCISION PROCEDURE (LEEP), ENDOCERVICAL CURETTAGE;  Surgeon: Greta Leatherwood, MD;  Location: [facility];  Service: Gynecology;  Laterality: N/A;   SUPRACERVICAL ABDOMINAL HYSTERECTOMY  2002   Done for bleeding, fibroids in [place]   Patient Active Problem List   Diagnosis Date Noted   Benign paroxysmal positional vertigo 06/22/2023   Trigger finger, right middle finger 01/11/2023   Flank pain 12/17/2022   Lumbar degenerative disc disease 11/28/2021   Nocturnal leg cramps 06/13/2021   CKD (chronic kidney disease) stage 3, GFR 30-59 ml/min (HCC) 06/26/2020   Morbid obesity (HCC) 05/30/2020   Gastroesophageal reflux disease without esophagitis 05/16/2020   Atypical chest pain 05/16/2020   Normocytic anemia 05/08/2020   Fatty liver 05/01/2020   Decreased appetite 04/22/2020    OSA (obstructive sleep apnea) 02/08/2020   Plantar fasciitis, bilateral 09/19/2019   Pain in right hand 02/21/2018   Bursitis of left shoulder 01/24/2016   GAD (generalized anxiety disorder) 04/02/2015   Localized primary osteoarthritis of carpometacarpal joint of right thumb 03/15/2015   Carpal tunnel syndrome, bilateral 04/05/2013   Right tennis elbow 03/24/2013   LGSIL (low grade squamous intraepithelial dysplasia) 06/16/2012   Type 2 diabetes mellitus with other specified complication (HCC) 07/02/2009   PANIC ATTACK 09/07/2008   RESTLESS LEG SYNDROME 04/02/2008   POLYARTHRITIS 04/02/2008   Primary osteoarthritis of both knees 01/02/2008   HOT FLASHES 08/04/2006   Hyperlipidemia associated with type 2 diabetes mellitus (HCC) 11/03/2005   HYPERTENSION, BENIGN SYSTEMIC 11/03/2005    PCP: Duaine German, MD REFERRING PROVIDER: Annemarie Kil REFERRING DIAG: H81.10 (ICD-10-CM) - Benign paroxysmal positional vertigo, unspecified laterality  THERAPY DIAG:  Dizziness and giddiness  Unsteadiness on feet  BPPV (benign paroxysmal positional vertigo), right  ONSET DATE: 06/22/23  Rationale for Evaluation and Treatment: Rehabilitation  SUBJECTIVE:  SUBJECTIVE STATEMENT: The patient is without symptoms. She reports no further episodes of dizziness with leg weakness. She denies lightheadedness today as well. She reports I'm back to normal moving around. She is doing a treadmill each day (3/4 up to 1 mile).   EVAL: The patient reports an intermittent sensation of spinning vertigo months ago when getting back in bed in the morning (after letting her dog out)  She did not have ongoing symptoms. This week she had a severe episode when lying back to get her eyebrows waxed. She had to wait minutes before driving and notes imbalance after the episode. She was feelign mild unsteadiness today. Pt accompanied by: self  PERTINENT HISTORY: HTN, diabetes  PAIN:  Are you having pain?  No  PRECAUTIONS: None  WEIGHT BEARING RESTRICTIONS: No  FALLS: Has patient fallen in last 6 months? No  LIVING ENVIRONMENT: Lives with: lives alone Lives in: House/apartment Stairs: No  PLOF: Independent, full time employment (IT department)  PATIENT GOALS: reduce spinning episodes  OBJECTIVE:  Note: Objective measures were completed at Evaluation unless otherwise noted. COGNITION: Overall cognitive status: Within functional limits for tasks assessed   SENSATION: WFL  POSTURE:  No Significant postural limitations  Cervical ROM:  WFLs  GAIT: Gait pattern: WFL  FUNCTIONAL TESTS:  To assess multi-sensory balance  PATIENT SURVEYS:  None today (may do DHI or patient functional scale)  VESTIBULAR ASSESSMENT: SYMPTOM BEHAVIOR:  Subjective history: intermittent episodes of spinning  Non-Vestibular symptoms: tinnitus  Type of dizziness: Spinning/Vertigo and Unsteady with head/body turns  Frequency: intermittent  Duration: minutes  Aggravating factors: positional changes (lying down/lying back), quick head  movements  Relieving factors: head stationary  Progression of symptoms: better  OCULOMOTOR EXAM:  Ocular Alignment: normal  Ocular ROM: No Limitations  Spontaneous Nystagmus: absent  Gaze-Induced Nystagmus: absent  Smooth Pursuits: intact  Saccades: intact   VESTIBULAR - OCULAR REFLEX:   Slow VOR: Comment: provokes mild dizziness with difficyulty maintaining gaze  VOR Cancellation: Normal  Head-Impulse Test: HIT Right: positive HIT Left: negative  R side very pronounced slow to correct refixation saccade   Dynamic Visual Acuity: TBA   POSITIONAL TESTING: Right Dix-Hallpike: no nystagmus; vague sense of dizziness rated 5/10 Left Dix-Hallpike: no nystagmus; vague sense of dizziness rated 3/10 Right Roll Test: no nystagmus; no dizziness Left Roll Test: no nystagmus; no dizziness Right Sidelying: no nystagmus; dizziness rated 3/10 Left Sidelying: no  nystagmus *Return from R dix hallpike provokes spinning sensation and 7/10 dizziness    [facility] Adult PT Treatment:                                                DATE: 07/07/23 Neuromuscular re-ed: Positional testing Thera Flakes daroff: negative for dizziness and lightheadedness Dix hallpike  negative for dizziness and lightheadedness Gaze adaptation X 1 viewing x 42 seconds== she continues to lose focus on the letter and has a hard time maintaining gaze fixation Self Care: Discussed continued participation in HEP for gaze adaptation daily, then reducing to 4 days/week and 2 Discussed continuation of HEP for strengthening   [facility] Adult PT Treatment:                                                DATE: 06/30/23 Therapeutic Exercise: MMT: HIP FLEXOR 3+/5 bilatrally, KNEE FLEXION: 5/5 bilat, KNEE EXTENSION 5/5 bilat; ANKLE DF: 4/5 Heel/toe raises x 10 reps Theraband green x 10 reps hip flexor strengthening Neuromuscular re-ed: Multi-sensory balance--- foam eyes open 30 seconds/eyes closed 30 seconds Tandem eyes open Feet together eyes closed + horizontal head motion Bilateral sidelying test (-) to the left and right, but mild sensation with return to sit from left side Bilateral dix hallpike Bilateral horizontal roll test Gaze x 1 adaptation  X 30 seconds Seated Horizontal plane Increased speed seated -- hard to keep focus Transitioned to standing x 30 seconds Canolith repositioning: R epley's maneuver due to symptomatic reports with R positional testing BP=132/77 (has not taken BP meds yet)                                                                                                                     [facility] Adult PT Treatment:  DATE: 06/25/23 Neuromuscular re-ed: Gaze adaptation Seated x 1 horizontal viewing x 20 seconds with cues on speed-- this does provoke 5/10 dizziness Habituation Brandt daroff habituation x 2 reps Patient gets  significant dizziness 5/10 that takes >3 minutes to resolve when returning to sit from each side Canolith repositioning: R epley's maneuver due to symptomatic reports with R positional testing Self Care: Discussed treatment plan for PT and goals of care  PATIENT EDUCATION: Education details: HEP, nature of condition, goals of rehab Person educated: Patient Education method: Explanation, Demonstration, and Handouts Education comprehension: verbalized understanding and returned demonstration  HOME EXERCISE PROGRAM: Access Code: [email or web address] URL: [email or web address] Date: 07/07/2023 Prepared by: Trygve Gage  Exercises - Brandt-Daroff Vestibular Exercise  - 2 x daily - 7 x weekly - 1 sets - 2-3 reps - Standing Gaze Stabilization with Head Rotation  - 2 x daily - 7 x weekly - 1 sets - 2 reps - 30 seconds hold - Heel Toe Raises with Counter Support  - 1 x daily - 5 x weekly - 1 sets - 10-15 reps - Standing Repeated Hip Flexion with Resistance  - 1 x daily - 5 x weekly - 1 sets - 10-15 reps  GOALS: Goals reviewed with patient? Yes  LONG TERM GOALS: Target date: 07/26/23  The patient will be indep with HEP. Baseline:  initiated at eval Goal status: MET  2.  The patient will have no dizziness with bilateral dix hallpike and bilateral sidelying tests. Baseline:  5/10 Goal status: MET  3.  The patient will tolerate gaze x 1 viewing x 60 seconds without dizziness. Baseline: 5/10 dizziness x 30 seconds Goal status: PARTIALLY MET (pt plans to continue working on this after d/c)  4.  The patient will be further assessed on balance and goal to follow, if appropriate. Baseline:  TBA Goal status: MET  ASSESSMENT: CLINICAL IMPRESSION: The patient reports she is back to baseline with no further symptoms. She does still have difficulty with gaze adaptation >30 seconds. PT recommended continued participation in this exercise for HEP.   EVAL: Patient is a 63 y.o. female who  was seen today for physical therapy evaluation and treatment for dizziness and unsteadiness. She presents with subjective reports of dizziness (no nystagmus viewed in room light) worse with R dix hallpike, and return to sitting. She also has + R head impulse testing indicating dec'd use of VOR, as well as motion sensitivity. PT initiated HEP for habituation and gaze and also treated with Epley's maneuver today. Plan to treat to ensure resolution of spinning sensation and unsteadiness.    OBJECTIVE IMPAIRMENTS: decreased activity tolerance and dizziness.   PLAN:  PT FREQUENCY: 1-2x/week  PT DURATION: 4 weeks  PLANNED INTERVENTIONS: 97164- PT Re-evaluation, 97110-Therapeutic exercises, 97530- Therapeutic activity, [id number]- Neuromuscular re-education, [id number]- Self Care, [id number]- Gait training, [phone]- Canalith repositioning, Patient/Family education, Balance training, Vestibular training, and Visual/preceptual remediation/compensation  PLAN FOR NEXT SESSION: Discharge   Zakhari Fogel, PT 07/07/2023, 3:20 PM

## 2023-07-12 ENCOUNTER — Other Ambulatory Visit: Payer: Self-pay

## 2023-07-12 ENCOUNTER — Ambulatory Visit
Admission: EM | Admit: 2023-07-12 | Discharge: 2023-07-12 | Disposition: A | Discharge status: Home / Self Care | Attending: Family Medicine | Admitting: Family Medicine

## 2023-07-12 DIAGNOSIS — S39012A Strain of muscle, fascia and tendon of lower back, initial encounter: Secondary | ICD-10-CM

## 2023-07-12 DIAGNOSIS — M5441 Lumbago with sciatica, right side: Secondary | ICD-10-CM | POA: Diagnosis not present

## 2023-07-12 MED ORDER — TIZANIDINE HCL 4 MG PO TABS: 4.0000 mg | ORAL_TABLET | Freq: Four times a day (QID) | ORAL | 0 refills | Status: DC | PRN

## 2023-07-12 MED ORDER — METHYLPREDNISOLONE 4 MG PO TBPK: ORAL_TABLET | ORAL | 0 refills | Status: DC

## 2023-07-12 MED ORDER — KETOROLAC TROMETHAMINE 30 MG/ML IJ SOLN
30.0000 mg | Freq: Once | INTRAMUSCULAR | Status: AC
Start: 2023-07-12 — End: 2023-07-12
  Administered 2023-07-12: 30 mg via INTRAMUSCULAR

## 2023-07-12 NOTE — ED Provider Notes (Signed)
 [facility]    CSN: [id number] Arrival date & time: 07/12/23  1523      History   Chief Complaint Chief Complaint  Patient presents with   Back Pain    HPI Maria Nunez is a 63 y.o. female.   Patient states that she bent over to pick up a heavy box and felt a sudden pain in her right low back yesterday.  She states it kept her awake last night.  She is very uncomfortable.  She has limited movement.  She has been taking over-the-counter medication and using Salonpas patches.  In spite of this her pain persists.  It is in the right low back radiating down the right leg to her toes.  No numbness or weakness.  Although she states when she puts weight on her right leg the pain makes her feel like it will give out. Patient had back pain in September of last year.  She saw Dr. Sandy Crumb.  An x-ray at that time showed early degenerative disc disease.  She denies an ongoing back condition    Past Medical History:  Diagnosis Date   Abnormal ultrasound of ovary 2016   Ultrasound showing bilateral ovaries in contact with one another.  Asymptomatic.   Allergy    multiple med allergies   Anemia 04/29/2020   Arthritis    left knee   Bursitis 01/24/2016   left shoulder   Chest pain 01/29/2021   See 01/29/21 ED note in Epic. Pain significantly improved w/ NTG. CBC, CMP, troponin, BNP unremarkable. EKG showed NSR w/o acute ST changes. 02/19/21 [facility] Stress test - normal.   Chest pain 01/13/2019   Troponin negative x 2, Cxray normal, EKG sinus rhythm w/ borderline T abnormality. Discharged from ER w/ instruction to f/u w/PCP.   Chronic back pain    Chronic kidney disease    Stage 3, follows w/PCP   Complication of anesthesia    Pt states that one time she was told that she was given general anesthesia too fast. She woke up covered in blotches and was given IV Benadryl per pt.   COVID-19 03/05/2021   congestion, fever, all symptoms resolved as of 05/16/21    Diabetes mellitus    Type 2   Fatty liver    Fibroid    hysteretomy in 2002   GERD (gastroesophageal reflux disease)    Patient is taking Pepcid  as of 05/16/21.   Heart murmur    very long time ago per pt on 05/16/21   Hidradenitis    years ago per pt on 05/16/21   High cholesterol    follows w/ PCP Dr. Duaine German   Hypertension    follows with PCP Dr. Duaine German   Plantar fasciitis, bilateral 09/24/2020   Sleep apnea    wears CPAP   Spondylosis 2023   lumbar, cervicothoracic region with cervicalgia   Wears contact lenses    Wears glasses     Patient Active Problem List   Diagnosis Date Noted   Benign paroxysmal positional vertigo 06/22/2023   Trigger finger, right middle finger 01/11/2023   Flank pain 12/17/2022   Lumbar degenerative disc disease 11/28/2021   Nocturnal leg cramps 06/13/2021   CKD (chronic kidney disease) stage 3, GFR 30-59 ml/min (HCC) 06/26/2020   Morbid obesity (HCC) 05/30/2020   Gastroesophageal reflux disease without esophagitis 05/16/2020   Atypical chest pain 05/16/2020   Normocytic anemia 05/08/2020   Fatty liver 05/01/2020   Decreased appetite  04/22/2020   OSA (obstructive sleep apnea) 02/08/2020   Plantar fasciitis, bilateral 09/19/2019   Pain in right hand 02/21/2018   Bursitis of left shoulder 01/24/2016   GAD (generalized anxiety disorder) 04/02/2015   Localized primary osteoarthritis of carpometacarpal joint of right thumb 03/15/2015   Carpal tunnel syndrome, bilateral 04/05/2013   Right tennis elbow 03/24/2013   LGSIL (low grade squamous intraepithelial dysplasia) 06/16/2012   Type 2 diabetes mellitus with other specified complication (HCC) 07/02/2009   PANIC ATTACK 09/07/2008   RESTLESS LEG SYNDROME 04/02/2008   POLYARTHRITIS 04/02/2008   Primary osteoarthritis of both knees 01/02/2008   HOT FLASHES 08/04/2006   Hyperlipidemia associated with type 2 diabetes mellitus (HCC) 11/03/2005   HYPERTENSION, BENIGN SYSTEMIC  11/03/2005    Past Surgical History:  Procedure Laterality Date   BREAST EXCISIONAL BIOPSY Right 2020   BREAST SURGERY     cysts removed right breast, around 20 years ago per pt on 05/16/21   BREAST SURGERY  08/2018   benign mass   CARPAL TUNNEL RELEASE Right 01/08/2017   Procedure: CARPAL TUNNEL RELEASE, POSSIBLE RIGHT THUMB CARPOMETACARPAL INJECTION;  Surgeon: Ronn Cohn, MD;  Location: [facility];  Service: Orthopedics;  Laterality: Right;  60 MINS   CESAREAN SECTION     1992 & 1994   COLPOSCOPY N/A 05/19/2021   Procedure: COLPOSCOPY;  Surgeon: Greta Leatherwood, MD;  Location[facility];  Service: Gynecology;  Laterality: N/A;   ESOPHAGOGASTRODUODENOSCOPY  07/19/2020   in Epic   INGUINAL HIDRADENITIS EXCISION     many years ago per pt on 05/16/21   LEEP  05/04/2013   HPV change   LEEP N/A 05/19/2021   Procedure: LOOP ELECTROSURGICAL EXCISION PROCEDURE (LEEP), ENDOCERVICAL CURETTAGE;  Surgeon: Greta Leatherwood, MD;  Location: [facility];  Service: Gynecology;  Laterality: N/A;   SUPRACERVICAL ABDOMINAL HYSTERECTOMY  2002   Done for bleeding, fibroids in [place]    OB History     Gravida  3   Para  2   Term  2   Preterm      AB  1   Living  2      SAB  1   IAB      Ectopic      Multiple      Live Births               Home Medications    Prior to Admission medications   Medication Sig Start Date End Date Taking? Authorizing Provider  methylPREDNISolone (MEDROL DOSEPAK) 4 MG TBPK tablet tad 07/12/23  Yes Stephany Ehrich, MD  tiZANidine (ZANAFLEX) 4 MG tablet Take 1-2 tablets (4-8 mg total) by mouth every 6 (six) hours as needed for muscle spasms. 07/12/23  Yes Stephany Ehrich, MD  AMBULATORY NON FORMULARY MEDICATION Set CPAP to 10 cm water pressure. Need download in 2 weeks after change of setting. 09/10/20   Cydney Draft, MD  Ascorbic Acid (VITAMIN C) 1000 MG tablet Take 1  tablet by mouth daily. 04/10/18   [provider]  Blood Glucose Monitoring Suppl (FREESTYLE LITE) DEVI  03/13/19   [provider]  Cyanocobalamin (B-12) 500 MCG TABS Take 1 tablet by mouth daily.    [provider]  estradiol  (ESTRACE ) 0.1 MG/GM vaginal cream Insert 1/2 gram vaginally three times a week. 05/04/22   Greta Leatherwood, MD  famotidine  (PEPCID ) 40 MG tablet Take 1 tablet by  mouth two times daily. 04/15/23   Cydney Draft, MD  fluticasone  (FLONASE ) 50 MCG/ACT nasal spray Place 1 spray into both nostrils daily. 06/07/22   Raspet, Erin K, PA-C  FOLIC ACID  PO Take 800 mcg by mouth daily.    [provider]  glucose blood (FREESTYLE LITE) test strip USE TO TEST BLOOD SUGARS TWICE DAILY 08/28/22 08/28/23  Cydney Draft, MD  Lancets (FREESTYLE) lancets For use when testing blood sugars daily. DX:E11.69 03/13/19   Cydney Draft, MD  melatonin 1 MG TABS tablet Take 1 mg by mouth at bedtime.    [provider]  metFORMIN  (GLUCOPHAGE ) 1000 MG tablet Take 1 tablet (1,000 mg total) by mouth 2 (two) times daily with a meal. 12/29/22 12/29/23  Cydney Draft, MD  pyridOXINE (VITAMIN B-6) 100 MG tablet Take 100 mg by mouth daily.    [provider]  rosuvastatin  (CRESTOR ) 40 MG tablet Take 1 tablet (40 mg total) by mouth daily. 04/15/23 04/14/24  Cydney Draft, MD  valsartan  (DIOVAN ) 160 MG tablet Take 1 tablet (160 mg total) by mouth daily. 12/28/22 11/27/23  Cydney Draft, MD  Zinc Sulfate (ZINC 15 PO) Take by mouth.    [provider]    Family History Family History  Problem Relation Age of Onset   Asthma Mother    COPD Mother    Diabetes Mother    Hypertension Mother    Diabetes Father    Hypertension Father    Breast cancer Maternal Aunt    Breast cancer Paternal Aunt    Colon cancer Neg Hx    Pancreatic cancer Neg Hx    Esophageal cancer Neg Hx    Stomach cancer Neg Hx    Liver  disease Neg Hx     Social History Social History   Tobacco Use   Smoking status: Never   Smokeless tobacco: Never  Vaping Use   Vaping status: Never Used  Substance Use Topics   Alcohol use: No    Alcohol/week: 0.0 standard drinks of alcohol   Drug use: No     Allergies   Sulfonamide derivatives, Xigduo  xr [dapagliflozin  pro-metformin  er], Omeprazole , Cefaclor, Cephalexin , Doxycycline , Hycodan [hydrocodone  bit-homatrop mbr], Januvia  [sitagliptin ], Levofloxacin , Penicillins, Tetracycline, and Victoza  [liraglutide ]   Review of Systems Review of Systems See HPI  Physical Exam Triage Vital Signs ED Triage Vitals  Encounter Vitals Group     BP 07/12/23 1529 (!) 157/81     Girls Systolic BP Percentile --      Girls Diastolic BP Percentile --      Boys Systolic BP Percentile --      Boys Diastolic BP Percentile --      Pulse Rate 07/12/23 1529 92     Resp 07/12/23 1529 16     Temp 07/12/23 1529 99.7 F (37.6 C)     Temp src --      SpO2 07/12/23 1529 95 %     Weight --      Height --      Head Circumference --      Peak Flow --      Pain Score 07/12/23 1532 6     Pain Loc --      Pain Education --      Exclude from Growth Chart --    No data found.  Updated Vital Signs BP (!) 157/81   Pulse 92   Temp 99.7 F (37.6 C)   Resp 16  LMP 01/27/2000 (Approximate) Comment: supracervical hysterectomy  SpO2 95%      Physical Exam Constitutional:      General: She is in acute distress.     Appearance: She is well-developed.     Comments: Patient is uncomfortable.  Guarded movement.  Mild overweight  HENT:     Head: Normocephalic and atraumatic.   Eyes:     Conjunctiva/sclera: Conjunctivae normal.     Pupils: Pupils are equal, round, and reactive to light.    Cardiovascular:     Rate and Rhythm: Normal rate.  Pulmonary:     Effort: Pulmonary effort is normal. No respiratory distress.  Abdominal:     Tenderness: There is no right CVA tenderness or left CVA  tenderness.   Musculoskeletal:        General: Tenderness present. No swelling. Normal range of motion.     Cervical back: Normal range of motion.     Comments: Tenderness in the lower lumbar muscles on the right and over the right SI joint.  No central lumbar tenderness.  Range of motion is limited secondary to pain.  Reflexes are trace at the knee and ankle.  Straight leg raise is positive on the right for increased back but not leg pain   Skin:    General: Skin is warm and dry.   Neurological:     General: No focal deficit present.     Mental Status: She is alert.      UC Treatments / Results  Labs (all labs ordered are listed, but only abnormal results are displayed) Labs Reviewed - No data to display  EKG   Radiology No results found.  Procedures Procedures (including critical care time)  Medications Ordered in UC Medications  ketorolac  (TORADOL ) 30 MG/ML injection 30 mg (30 mg Intramuscular Given 07/12/23 1553)    Initial Impression / Assessment and Plan / UC Course  I have reviewed the triage vital signs and the nursing notes.  Pertinent labs & imaging results that were available during my care of the patient were reviewed by me and considered in my medical decision making (see chart for details).     Patient's most recent GFR measurements are in the 54-59 range.  I feel it was safe to give her a shot of Toradol .  She states that Dr. Greer Leak has given her this before with good results. Final Clinical Impressions(s) / UC Diagnoses   Final diagnoses:  Strain of lumbar region, initial encounter  Acute right-sided low back pain with right-sided sciatica     Discharge Instructions      Take the Medrol Dosepak as directed.  Take all of day 1 today. Take tizanidine at bedtime.  You may take 1 or 2 to help with muscle spasm and pain May use ice or heat to painful back muscle Activity as tolerated.  Avoid lifting while back is painful See your doctor if not  improving in a few days   ED Prescriptions     Medication Sig Dispense Auth. Provider   methylPREDNISolone (MEDROL DOSEPAK) 4 MG TBPK tablet tad 21 tablet Stephany Ehrich, MD   tiZANidine (ZANAFLEX) 4 MG tablet Take 1-2 tablets (4-8 mg total) by mouth every 6 (six) hours as needed for muscle spasms. 21 tablet Stephany Ehrich, MD      PDMP not reviewed this encounter.   Stephany Ehrich, MD 07/12/23 [phone]

## 2023-07-12 NOTE — ED Triage Notes (Signed)
 Yesterday strained back lifting a package. Has c/o right lower back pain. Has been using salonpas patches.

## 2023-07-12 NOTE — Discharge Instructions (Addendum)
 Take the Medrol Dosepak as directed.  Take all of day 1 today. Take tizanidine at bedtime.  You may take 1 or 2 to help with muscle spasm and pain May use ice or heat to painful back muscle Activity as tolerated.  Avoid lifting while back is painful See your doctor if not improving in a few days

## 2023-07-13 ENCOUNTER — Other Ambulatory Visit (HOSPITAL_COMMUNITY): Payer: Self-pay

## 2023-07-13 ENCOUNTER — Ambulatory Visit: Admitting: Family Medicine

## 2023-07-13 ENCOUNTER — Encounter: Payer: Self-pay | Admitting: Family Medicine

## 2023-07-13 DIAGNOSIS — I10 Essential (primary) hypertension: Secondary | ICD-10-CM

## 2023-07-13 MED ORDER — VALSARTAN 160 MG PO TABS
160.0000 mg | ORAL_TABLET | Freq: Every day | ORAL | 3 refills | Status: AC
  Filled 2023-07-13 – 2023-07-14 (×2): qty 90, 90d supply, fill #0
  Filled 2023-10-08: qty 90, 90d supply, fill #1
  Filled 2024-01-06: qty 90, 90d supply, fill #2

## 2023-07-13 MED ORDER — ROSUVASTATIN CALCIUM 40 MG PO TABS
40.0000 mg | ORAL_TABLET | Freq: Every day | ORAL | 3 refills | Status: AC
  Filled 2023-07-13 – 2023-07-14 (×2): qty 90, 90d supply, fill #0
  Filled 2023-10-08: qty 90, 90d supply, fill #1
  Filled 2024-01-06: qty 90, 90d supply, fill #2

## 2023-07-14 ENCOUNTER — Other Ambulatory Visit (HOSPITAL_COMMUNITY): Payer: Self-pay

## 2023-07-14 ENCOUNTER — Encounter (HOSPITAL_COMMUNITY): Payer: Self-pay | Admitting: Pharmacist

## 2023-07-14 ENCOUNTER — Ambulatory Visit: Admitting: Family Medicine

## 2023-07-14 ENCOUNTER — Encounter: Admitting: Rehabilitative and Restorative Service Providers"

## 2023-07-15 ENCOUNTER — Other Ambulatory Visit: Payer: Self-pay

## 2023-07-21 ENCOUNTER — Encounter: Admitting: Rehabilitative and Restorative Service Providers"

## 2023-07-23 ENCOUNTER — Other Ambulatory Visit (HOSPITAL_COMMUNITY): Payer: Self-pay

## 2023-07-25 ENCOUNTER — Other Ambulatory Visit: Payer: Self-pay | Admitting: Obstetrics and Gynecology

## 2023-07-28 ENCOUNTER — Ambulatory Visit: Admitting: Sports Medicine

## 2023-07-28 ENCOUNTER — Other Ambulatory Visit (INDEPENDENT_AMBULATORY_CARE_PROVIDER_SITE_OTHER)

## 2023-07-28 DIAGNOSIS — M7711 Lateral epicondylitis, right elbow: Secondary | ICD-10-CM | POA: Diagnosis not present

## 2023-07-28 MED ORDER — TRIAMCINOLONE ACETONIDE 40 MG/ML IJ SUSP
40.0000 mg | Freq: Once | INTRAMUSCULAR | Status: AC
  Administered 2023-07-28: 40 mg via INTRA_ARTICULAR

## 2023-07-28 NOTE — Progress Notes (Signed)
    Procedures performed today:    Procedure: Real-time Ultrasound Guided injection of the right common extensor tendon origin Device: Samsung HS60  Verbal informed consent obtained.  Time-out conducted.  Noted no overlying erythema, induration, or other signs of local infection.  Skin prepped in a sterile fashion.  Local anesthesia: Topical Ethyl chloride.  With sterile technique and under real time ultrasound guidance: Noted deep insertional hypoechoic change consistent with tendinopathy, 1 cc Kenalog  40, 1 cc lidocaine , 1 cc bupivacaine  injected easily Completed without difficulty  Advised to call if fevers/chills, erythema, induration, drainage, or persistent bleeding.  Images permanently stored and available for review in PACS.  Impression: Technically successful ultrasound guided injection.  Independent interpretation of notes and tests performed by another provider:   None.  Brief History, Exam, Impression, and Recommendations:    Right tennis elbow Very pleasant 63 year old female, she has chronic tennis elbow, she has had this and spite of counterforce bracing, home physical therapy, we did an injection in January, she did well now with recurrence of pain 6 months later. I will repeat steroid injection but ultimately if this recurs it will need to be PRP.    ____________________________________________ Debby PARAS. Curtis, M.D., ABFM., CAQSM., AME. Primary Care and Sports Medicine [facility]  Adjunct Professor of [facility] Medicine  [employer]  Restaurant manager, fast food

## 2023-07-28 NOTE — Patient Instructions (Signed)
 Platelet-rich plasma is used in musculoskeletal medicine to focus your own body's ability to heal. It has several well-done published randomized control trials (RCT) which demonstrate both its effectiveness and safety in many musculoskeletal conditions, including osteoarthritis, tendinopathies, and damaged vertebral discs. PRP has been in clinical use since the 1990's. Many people know that platelets form a clot if there is a cut in the skin. It turns out that platelets do not only form a clot, they also start the body's own repair process. When platelets activate to form a clot, they also release alpha granules which have hundreds of chemical messengers in them that initiate and organize repair to the damaged tissue. Precisely placing PRP into the site of injury will initiate the healing process by activating on the damaged cartilage or tendon. This is an inflammatory process, and inflammation is the vital first phase of Healing.  What to expect and how to prepare for PRP   2 weeks prior to the procedure: depending on the procedure, you may need to arrange for a driver to bring you home. IF you are having a lower extremity procedure, we can provide crutches as needed.   7 days prior to the procedure: Stop taking anti-inflammatory drugs like ibuprofen, Naprosyn, Celebrex, or Meloxicam. Let Dr. Benjamin Nunez know if you have been taking prednisone or other corticosteroids in the last month.   The day before the procedure: thoroughly shower and clean your skin.    The day of the procedure: Wear loose-fitting clothing like sweatpants or shorts. If you are having an upper body procedure wear a top that can button or zip up.  PRP will initiate healing and a productive inflammation, and PRP therapy will make the body part treated sore for 4 days to two weeks. Anti-inflammatory drugs (i.e. ibuprofen, Naprosyn, Celebrex) and corticosteroids such as prednisone can blunt or stop this process,  so it is important to not take any anti-inflammatory drugs for 7 days before getting PRP therapy, or for at least three weeks after PRP therapy. Corticosteroid injections can blunt inflammation for 30 days, so let us know if you have had one recently. Depending on the body part injected, you may be in a sling or on crutches for several days. Just like wringing out a wet dishcloth, if you load or tense a tendon or ligament that has just been injected with PRP, some of the PRP injected will squish out. By keeping the body part treated relaxed by using a sling (for the shoulder or arm) or crutches (for hips and legs) for a few days, the PRP can bind in place and do its job.   You may need a driver to bring you home.  Tobacco/nicotine is a potent toxin and its use constricts small blood vessels which are needed for tissue repair.  Tobacco/nicotine use will limit the effectiveness of any treatment and stopping tobacco use is one of the single  greatest actions you can take to improve your health. Avoid toxins like alcohol, which inhibits and depresses the cells needed for tissue repair.  What happens during the PRP procedure?  Platelet rich plasma is made by taking some of your blood and performing a two-stage centrifuge process on it to concentrate the PRP. First, your blood is drawn into a syringe with a small amount of anti-coagulant in it (this is to keep the blood from clotting during this process). The amount of blood drawn is usually about 10-30 milliliters, depending on how much PRP is needed for  the treatment.  (There are 355 milliliters in a 12-ounce soda can for comparison).  Then the blood is transferred in a sterile fashion into a centrifuge tube. It is then centrifuged for the first cycle where the red blood cells are isolated and discarded. In the second centrifuge cycle, the platelet-rich fraction of the remaining plasma is concentrated and placed in a syringe. The skin at the  injection site is numbed with a small amount of topical cooling spray. Dr Maria Nunez will then precisely inject the PRP into the injury site using ultrasound guidance.  What to do after your procedure  I will give you specific medicine to control any discomfort you may have after the procedure. Avoid NSAIDs like ibuprofen. Acetaminophen can be used for mild pain.  Depending on the part of the body treated, usually you will be placed in a sling or on crutches for 1 to 3 days. Do your best not to tense or load the treated area during this time. After 3 days, unless otherwise instructed, the treated body part should be used and slowly moved through its full range of motion. It will be sore, but you will not be doing damage by moving it, in fact it needs to move to heal. If you were on crutches for a period of time, walking is ok once you are off the crutches. For now, avoid activities that specifically hurt you before being treated. Exercise is vital to good health and finding a way to cross train around your injury is important not only for your physical health, but for your mental health as well. Ask me about cross training options for your injury. Some brief (10 minutes or less) period of heat or ice therapy will not hurt the therapy, but it is not required. Usually, depending on the initial injury, physical therapy is started from two weeks to four weeks after injection. Improvements in pain and function should be expected from 8 weeks to 12 weeks after injection and some injuries may require more than one treatment.    ____________________________________________ Maria Nunez. Maria Nunez, M.D., ABFM., CAQSM., AME. Primary Care and Sports Medicine [facility]  Adjunct Professor of Family Medicine  [employer]  Restaurant manager, fast food

## 2023-07-28 NOTE — Assessment & Plan Note (Signed)
 Very pleasant 63 year old female, she has chronic tennis elbow, she has had this and spite of counterforce bracing, home physical therapy, we did an injection in January, she did well now with recurrence of pain 6 months later. I will repeat steroid injection but ultimately if this recurs it will need to be PRP.

## 2023-07-28 NOTE — Addendum Note (Signed)
 Addended by: OLEY CHIQUITA CROME on: 07/28/2023 09:42 AM   Modules accepted: Orders

## 2023-07-28 NOTE — Telephone Encounter (Signed)
 Med refill request: Estrace   Last AEX: 05/04/22 Next AEX: 09/20/23 Last MMG (if hormonal med) 10/21/22 BIRADS Cat 2 benign  Refill authorized: last rx 05/04/22 42.5g with 1 refill. Please approve or deny

## 2023-07-29 ENCOUNTER — Other Ambulatory Visit (HOSPITAL_COMMUNITY): Payer: Self-pay

## 2023-07-29 ENCOUNTER — Other Ambulatory Visit: Payer: Self-pay

## 2023-07-29 ENCOUNTER — Ambulatory Visit: Admitting: Family Medicine

## 2023-07-29 ENCOUNTER — Encounter: Payer: Self-pay | Admitting: Family Medicine

## 2023-07-29 DIAGNOSIS — E1169 Type 2 diabetes mellitus with other specified complication: Secondary | ICD-10-CM

## 2023-07-29 DIAGNOSIS — Z7984 Long term (current) use of oral hypoglycemic drugs: Secondary | ICD-10-CM | POA: Diagnosis not present

## 2023-07-29 DIAGNOSIS — I10 Essential (primary) hypertension: Secondary | ICD-10-CM | POA: Diagnosis not present

## 2023-07-29 LAB — POCT GLYCOSYLATED HEMOGLOBIN (HGB A1C): Hemoglobin A1C: 7.3 % — AB (ref 4.0–5.6)

## 2023-07-29 MED ORDER — RYBELSUS 3 MG PO TABS
3.0000 mg | ORAL_TABLET | Freq: Every day | ORAL | 1 refills | Status: DC
  Filled 2023-07-29: qty 30, 30d supply, fill #0

## 2023-07-29 MED ORDER — ESTRADIOL 0.1 MG/GM VA CREA
TOPICAL_CREAM | VAGINAL | 0 refills | Status: DC
Start: 2023-07-29 — End: 2023-09-20
  Filled 2023-07-29: qty 42.5, 30d supply, fill #0

## 2023-07-29 NOTE — Assessment & Plan Note (Signed)
 Well controlled. Continue current regimen. Follow up in  6 mo

## 2023-07-29 NOTE — Assessment & Plan Note (Signed)
 Discussed options.  She is not really wanting to do one of the injectable GLP-1's but is open to maybe trying Rybelsus just encouraged her to make sure that she is using a softener or either some extra fiber to keep her bowels moving as she did have constipation with Tosa.  Continue metformin .  Continue work on [employer] and regular exercise.  She has lost weight since she was last here which is fantastic she has been walking on the treadmill and walking her dog regularly

## 2023-07-29 NOTE — Progress Notes (Signed)
 Established Patient Office Visit  Subjective  Patient ID: Maria Nunez, female    DOB: 1960-06-10  Age: 63 y.o. MRN: [id number]  Chief Complaint  Patient presents with   Diabetes   Hypertension    HPI  Hypertension- Pt denies chest pain, SOB, dizziness, or heart palpitations.  Taking meds as directed w/o problems.  Denies medication side effects.    Diabetes - no hypoglycemic events. No wounds or sores that are not healing well. No increased thirst or urination. Checking glucose at home. Taking medications as prescribed without any side effects.  She is doing well otherwise she has been exercising regularly and is actually lost some weight recently.  Though she also went on vacation she is taking her metformin  regularly.  She says June's been a rough month she got some fluid on her right knee from walking on the treadmill, then she ended up bumping her right elbow and now has a tendinitis.  She also ended up straining her back and had to go to urgent care a couple of weeks ago.    ROS    Objective:     BP (!) 128/59   Pulse 65   Ht 4' 11.5 (1.511 m)   Wt 159 lb 9.6 oz (72.4 kg)   LMP 01/27/2000 (Approximate) Comment: supracervical hysterectomy  SpO2 97%   BMI 31.70 kg/m    Physical Exam Vitals and nursing note reviewed.  Constitutional:      Appearance: Normal appearance.  HENT:     Head: Normocephalic and atraumatic.  Eyes:     Conjunctiva/sclera: Conjunctivae normal.  Cardiovascular:     Rate and Rhythm: Normal rate and regular rhythm.  Pulmonary:     Effort: Pulmonary effort is normal.     Breath sounds: Normal breath sounds.  Skin:    General: Skin is warm and dry.  Neurological:     Mental Status: She is alert.  Psychiatric:        Mood and Affect: Mood normal.      Results for orders placed or performed in visit on 07/29/23  POCT HgB A1C  Result Value Ref Range   Hemoglobin A1C 7.3 (A) 4.0 - 5.6 %   HbA1c POC (<> result, manual  entry)     HbA1c, POC (prediabetic range)     HbA1c, POC (controlled diabetic range)        The ASCVD Risk score (Arnett DK, et al., 2019) failed to calculate for the following reasons:   The valid total cholesterol range is 130 to 320 mg/dL    Assessment & Plan:   Problem List Items Addressed This Visit       Cardiovascular and Mediastinum   HYPERTENSION, BENIGN SYSTEMIC   Well controlled. Continue current regimen. Follow up in  6 mo       Relevant Orders   CMP14+EGFR   Lipid panel   CBC   Urine Microalbumin w/creat. ratio     Endocrine   Type 2 diabetes mellitus with other specified complication [facility])   Discussed options.  She is not really wanting to do one of the injectable GLP-1's but is open to maybe trying Rybelsus just encouraged her to make sure that she is using a softener or either some extra fiber to keep her bowels moving as she did have constipation with Tosa.  Continue metformin .  Continue work on [employer] and regular exercise.  She has lost weight since she was last here which is fantastic she has  been walking on the treadmill and walking her dog regularly      Relevant Medications   Semaglutide (RYBELSUS) 3 MG TABS   Other Relevant Orders   POCT HgB A1C (Completed)   CMP14+EGFR   Lipid panel   CBC   Urine Microalbumin w/creat. ratio    Return in about 3 months (around 11/05/2023) for Diabetes follow-up.    Dorothyann Byars, MD

## 2023-08-01 ENCOUNTER — Ambulatory Visit: Payer: Self-pay | Admitting: Family Medicine

## 2023-08-01 LAB — CMP14+EGFR
ALT: 13 IU/L (ref 0–32)
AST: 17 IU/L (ref 0–40)
Albumin: 4.4 g/dL (ref 3.9–4.9)
Alkaline Phosphatase: 81 IU/L (ref 44–121)
BUN/Creatinine Ratio: 20 (ref 12–28)
BUN: 21 mg/dL (ref 8–27)
Bilirubin Total: <0.2 mg/dL (ref 0.0–1.2)
CO2: 19 mmol/L — ABNORMAL LOW (ref 20–29)
Calcium: 9.7 mg/dL (ref 8.7–10.3)
Chloride: 105 mmol/L (ref 96–106)
Creatinine, Ser: 1.06 mg/dL — ABNORMAL HIGH (ref 0.57–1.00)
Globulin, Total: 2.2 g/dL (ref 1.5–4.5)
Glucose: 163 mg/dL — ABNORMAL HIGH (ref 70–99)
Potassium: 4.2 mmol/L (ref 3.5–5.2)
Sodium: 142 mmol/L (ref 134–144)
Total Protein: 6.6 g/dL (ref 6.0–8.5)
eGFR: 59 mL/min/1.73 — ABNORMAL LOW (ref >59)

## 2023-08-01 LAB — LIPID PANEL
Chol/HDL Ratio: 2.5 ratio (ref 0.0–4.4)
Cholesterol, Total: 133 mg/dL (ref 100–199)
HDL: 54 mg/dL (ref >39)
LDL Chol Calc (NIH): 59 mg/dL (ref 0–99)
Triglycerides: 112 mg/dL (ref 0–149)
VLDL Cholesterol Cal: 20 mg/dL (ref 5–40)

## 2023-08-01 LAB — CBC
Hematocrit: 37 % (ref 34.0–46.6)
Hemoglobin: 11.9 g/dL (ref 11.1–15.9)
MCH: 28.6 pg (ref 26.6–33.0)
MCHC: 32.2 g/dL (ref 31.5–35.7)
MCV: 89 fL (ref 79–97)
Platelets: 272 x10E3/uL (ref 150–450)
RBC: 4.16 x10E6/uL (ref 3.77–5.28)
RDW: 12.8 % (ref 11.7–15.4)
WBC: 9.8 x10E3/uL (ref 3.4–10.8)

## 2023-08-01 LAB — MICROALBUMIN / CREATININE URINE RATIO
Creatinine, Urine: 80.4 mg/dL
Microalb/Creat Ratio: 75 mg/g{creat} — AB (ref 0–29)
Microalbumin, Urine: 60.4 ug/mL

## 2023-08-01 NOTE — Progress Notes (Signed)
 Hi Brielle, kidney function is stable at 1.0.  It is really looked great this last year.  Before that you had a slight bump in your kidney function but this last year it has really look consistently better which is wonderful.  There is still a little bit of excess protein in the urine.  Will continue to work on getting your A1c down and that should help.  Cholesterol and blood count look good.

## 2023-08-02 ENCOUNTER — Ambulatory Visit: Admitting: Sports Medicine

## 2023-08-03 ENCOUNTER — Encounter (HOSPITAL_COMMUNITY): Payer: Self-pay

## 2023-08-03 ENCOUNTER — Other Ambulatory Visit (HOSPITAL_COMMUNITY): Payer: Self-pay

## 2023-08-04 ENCOUNTER — Telehealth (HOSPITAL_COMMUNITY): Payer: Self-pay | Admitting: Pharmacy Technician

## 2023-08-04 ENCOUNTER — Other Ambulatory Visit: Payer: Self-pay

## 2023-08-04 ENCOUNTER — Other Ambulatory Visit (HOSPITAL_COMMUNITY): Payer: Self-pay

## 2023-08-04 NOTE — Telephone Encounter (Signed)
//

## 2023-08-05 ENCOUNTER — Encounter: Payer: Self-pay | Admitting: Family Medicine

## 2023-08-05 ENCOUNTER — Other Ambulatory Visit (HOSPITAL_COMMUNITY): Payer: Self-pay

## 2023-08-06 NOTE — Telephone Encounter (Signed)
 Is called to get her scheduled for nurse visit for BRCA testing.

## 2023-08-06 NOTE — Telephone Encounter (Signed)
 Patient stated she would call back next week and schedule appointment with the Nurse

## 2023-08-17 DIAGNOSIS — Z808 Family history of malignant neoplasm of other organs or systems: Secondary | ICD-10-CM | POA: Diagnosis not present

## 2023-08-17 DIAGNOSIS — Z8481 Family history of carrier of genetic disease: Secondary | ICD-10-CM | POA: Diagnosis not present

## 2023-08-17 DIAGNOSIS — Z803 Family history of malignant neoplasm of breast: Secondary | ICD-10-CM | POA: Diagnosis not present

## 2023-08-30 ENCOUNTER — Encounter: Admitting: Family Medicine

## 2023-09-08 ENCOUNTER — Other Ambulatory Visit: Payer: Self-pay | Admitting: Family Medicine

## 2023-09-08 DIAGNOSIS — Z1231 Encounter for screening mammogram for malignant neoplasm of breast: Secondary | ICD-10-CM

## 2023-09-14 ENCOUNTER — Ambulatory Visit (INDEPENDENT_AMBULATORY_CARE_PROVIDER_SITE_OTHER): Admitting: Family Medicine

## 2023-09-14 ENCOUNTER — Encounter: Payer: Self-pay | Admitting: Family Medicine

## 2023-09-14 VITALS — BP 134/59 | HR 66 | Ht 59.5 in | Wt 161.6 lb

## 2023-09-14 DIAGNOSIS — Z1211 Encounter for screening for malignant neoplasm of colon: Secondary | ICD-10-CM | POA: Diagnosis not present

## 2023-09-14 DIAGNOSIS — Z0000 Encounter for general adult medical examination without abnormal findings: Secondary | ICD-10-CM | POA: Diagnosis not present

## 2023-09-14 NOTE — Progress Notes (Signed)
 Complete physical exam  Patient: Maria Nunez   DOB: April 02, 1960   63 y.o. Female  MRN: [id number]  Subjective:    Chief Complaint  Patient presents with   Annual Exam    Maria Nunez is a 63 y.o. female who presents today for a complete physical exam. She reports consuming a general diet. Some exercise.  She generally feels fairly well. She reports sleeping fairly well. She does not have additional problems to discuss today.    Most recent fall risk assessment:    09/14/2023    8:23 AM  Fall Risk   Falls in the past year? 0  Number falls in past yr: 0  Injury with Fall? 0  Risk for fall due to : No Fall Risks  Follow up Falls evaluation completed     Most recent depression screenings:    09/14/2023    8:23 AM 04/15/2023    8:35 AM  PHQ 2/9 Scores  PHQ - 2 Score 0 0         Patient Care Team: Alvan Dorothyann BIRCH, MD as PCP - General Cathlyn JAYSON Nikki Bobie FORBES, MD as Consulting Physician (Obstetrics and Gynecology)   Outpatient Medications Prior to Visit  Medication Sig   AMBULATORY NON FORMULARY MEDICATION Set CPAP to 10 cm water pressure. Need download in 2 weeks after change of setting.   Ascorbic Acid (VITAMIN C) 1000 MG tablet Take 1 tablet by mouth daily.   Blood Glucose Monitoring Suppl (FREESTYLE LITE) DEVI    Cyanocobalamin (B-12) 500 MCG TABS Take 1 tablet by mouth daily.   estradiol  (ESTRACE ) 0.1 MG/GM vaginal cream Insert 1/2 gram vaginally three times a week.   famotidine  (PEPCID ) 40 MG tablet Take 1 tablet by mouth two times daily.   fluticasone  (FLONASE ) 50 MCG/ACT nasal spray Place 1 spray into both nostrils daily.   FOLIC ACID  PO Take 800 mcg by mouth daily.   glucose blood (FREESTYLE LITE) test strip USE TO TEST BLOOD SUGARS TWICE DAILY   Lancets (FREESTYLE) lancets For use when testing blood sugars daily. DX:E11.69   melatonin 1 MG TABS tablet Take 1 mg by mouth at bedtime.   metFORMIN  (GLUCOPHAGE ) 1000 MG tablet Take 1 tablet  (1,000 mg total) by mouth 2 (two) times daily with a meal.   pyridOXINE (VITAMIN B-6) 100 MG tablet Take 100 mg by mouth daily.   rosuvastatin  (CRESTOR ) 40 MG tablet Take 1 tablet (40 mg total) by mouth daily.   Semaglutide  (RYBELSUS ) 3 MG TABS Take 1 tablet (3 mg total) by mouth daily.   valsartan  (DIOVAN ) 160 MG tablet Take 1 tablet (160 mg total) by mouth daily.   Zinc Sulfate (ZINC 15 PO) Take by mouth.   No facility-administered medications prior to visit.    ROS        Objective:     BP (!) 134/59   Pulse 66   Ht 4' 11.5 (1.511 m)   Wt 161 lb 9.6 oz (73.3 kg)   LMP 01/27/2000 (Approximate) Comment: supracervical hysterectomy  SpO2 97%   BMI 32.09 kg/m     Physical Exam Constitutional:      Appearance: Normal appearance.  HENT:     Head: Normocephalic and atraumatic.     Right Ear: Tympanic membrane, ear canal and external ear normal.     Left Ear: Tympanic membrane, ear canal and external ear normal.     Nose: Nose normal.     Mouth/Throat:  Pharynx: Oropharynx is clear.  Eyes:     Extraocular Movements: Extraocular movements intact.     Conjunctiva/sclera: Conjunctivae normal.     Pupils: Pupils are equal, round, and reactive to light.  Neck:     Thyroid : No thyromegaly.  Cardiovascular:     Rate and Rhythm: Normal rate and regular rhythm.  Pulmonary:     Effort: Pulmonary effort is normal.     Breath sounds: Normal breath sounds.  Abdominal:     General: Bowel sounds are normal.     Palpations: Abdomen is soft.     Tenderness: There is no abdominal tenderness.  Musculoskeletal:        General: No swelling.     Cervical back: Neck supple.  Skin:    General: Skin is warm and dry.  Neurological:     Mental Status: She is oriented to person, place, and time.  Psychiatric:        Mood and Affect: Mood normal.        Behavior: Behavior normal.      No results found for any visits on 09/14/23.      Assessment & Plan:    Routine Health  Maintenance and Physical Exam  Immunization History  Administered Date(s) Administered   Influenza Split 10/27/2011, 11/12/2021   Influenza Whole 12/02/2005   Influenza,inj,Quad PF,6+ Mos 10/19/2012, 01/02/2014, 10/15/2015, 11/08/2018, 11/08/2019, 10/22/2020   Influenza-Unspecified 10/20/2012, 11/10/2017, 10/31/2022   PFIZER(Purple Top)SARS-COV-2 Vaccination 04/06/2019, 04/28/2019, 01/23/2020   PNEUMOCOCCAL CONJUGATE-20 08/27/2020   Pneumococcal Polysaccharide-23 04/18/2012, 04/18/2012   Td 01/27/2004   Tdap 04/03/2014    Health Maintenance  Topic Date Due   Zoster Vaccines- Shingrix (1 of 2) Never done   Colonoscopy  07/25/2023   INFLUENZA VACCINE  08/27/2023   COVID-19 Vaccine (4 - 2024-25 season) 01/26/2024 (Originally 09/27/2022)   HEMOGLOBIN A1C  01/29/2024   DTaP/Tdap/Td (3 - Td or Tdap) 04/02/2024   FOOT EXAM  04/14/2024   OPHTHALMOLOGY EXAM  06/14/2024   Diabetic kidney evaluation - eGFR measurement  07/28/2024   Diabetic kidney evaluation - Urine ACR  07/28/2024   MAMMOGRAM  10/20/2024   Cervical Cancer Screening (HPV/Pap Cotest)  05/04/2027   Pneumococcal Vaccine: 50+ Years  Completed   Hepatitis C Screening  Completed   HIV Screening  Completed   Hepatitis B Vaccines 19-59 Average Risk  Aged Out   HPV VACCINES  Aged Out   Meningococcal B Vaccine  Aged Out   Pneumococcal Vaccine  Discontinued    Discussed health benefits of physical activity, and encouraged her to engage in regular exercise appropriate for her age and condition.  Problem List Items Addressed This Visit   None Visit Diagnoses       Wellness examination    -  Primary     Screen for colon cancer       Relevant Orders   Ambulatory referral to Gastroenterology      Discussed shingles vaccine.   Labs are up to date.  Continue to work on exercise.   Hasn't started the Rybelsus  yet.    Return in about 3 months (around 12/01/2023) for Diabetes follow-up.     Dorothyann Byars, MD

## 2023-09-16 NOTE — Progress Notes (Signed)
 63 y.o. G75P2012 Divorced [name] here for annual exam.    Had vertigo.   Had tennis elbow and knee issues.   Has been using the vaginal estrogen twice a week this month in preparation for the pap today.    Last A1C 7.1.  Daughter Harlene has BRCA1.  No current dx of cancer.     Patient and other daughter Tera had negative BRCA testing.   Going to the beach for [place].   PCP: Alvan Dorothyann BIRCH, MD   Patient's last menstrual period was 01/27/2000 (approximate).           Sexually active: No.  The current method of family planning is status post hysterectomy.    Menopausal hormone therapy:  Estrace  Exercising: Yes.    Walking Smoker:  no  OB History  Gravida Para Term Preterm AB Living  3 2 2  1 2   SAB IAB Ectopic Multiple Live Births  1        # Outcome Date GA Lbr Len/2nd Weight Sex Type Anes PTL Lv  3 SAB           2 Term           1 Term              HEALTH MAINTENANCE: Last 2 paps:  05/04/22 neg HR HPV neg, 04/11/21 non diagnostic, 08/19/20 HR HPV + History of abnormal Pap or positive HPV:  yes Mammogram:   10/21/22 Breast Density Cat B, BIRADS Cat 2 benign.  Has appointment 10/22/23.   Colonoscopy:  2015 - due with Dr. Kristie.  PCP is helping to coordinate.  Bone Density:  n/a  Result  n/a   Immunization History  Administered Date(s) Administered   Influenza Split 10/27/2011, 11/12/2021   Influenza Whole 12/02/2005   Influenza,inj,Quad PF,6+ Mos 10/19/2012, 01/02/2014, 10/15/2015, 11/08/2018, 11/08/2019, 10/22/2020   Influenza-Unspecified 10/20/2012, 11/10/2017, 10/31/2022   PFIZER(Purple Top)SARS-COV-2 Vaccination 04/06/2019, 04/28/2019, 01/23/2020   PNEUMOCOCCAL CONJUGATE-20 08/27/2020   Pneumococcal Polysaccharide-23 04/18/2012, 04/18/2012   Td 01/27/2004   Tdap 04/03/2014      reports that she has never smoked. She has never used smokeless tobacco. She reports that she does not drink alcohol and does not use drugs.  Past Medical  History:  Diagnosis Date   Abnormal ultrasound of ovary 2016   Ultrasound showing bilateral ovaries in contact with one another.  Asymptomatic.   Allergy    multiple med allergies   Anemia 04/29/2020   Arthritis    left knee   Bursitis 01/24/2016   left shoulder   Chest pain 01/29/2021   See 01/29/21 ED note in Epic. Pain significantly improved w/ NTG. CBC, CMP, troponin, BNP unremarkable. EKG showed NSR w/o acute ST changes. 02/19/21 [facility] Stress test - normal.   Chest pain 01/13/2019   Troponin negative x 2, Cxray normal, EKG sinus rhythm w/ borderline T abnormality. Discharged from ER w/ instruction to f/u w/PCP.   Chronic back pain    Chronic kidney disease    Stage 3, follows w/PCP   Complication of anesthesia    Pt states that one time she was told that she was given general anesthesia too fast. She woke up covered in blotches and was given IV Benadryl per pt.   COVID-19 03/05/2021   congestion, fever, all symptoms resolved as of 05/16/21   Diabetes mellitus    Type 2   Fatty liver    Fibroid    hysteretomy in  2002   GERD (gastroesophageal reflux disease)    Patient is taking Pepcid  as of 05/16/21.   Heart murmur    very long time ago per pt on 05/16/21   Hidradenitis    years ago per pt on 05/16/21   High cholesterol    follows w/ PCP Dr. Dorothyann Byars   Hypertension    follows with PCP Dr. Dorothyann Byars   Plantar fasciitis, bilateral 09/24/2020   Sleep apnea    wears CPAP   Spondylosis 2023   lumbar, cervicothoracic region with cervicalgia   Wears contact lenses    Wears glasses     Past Surgical History:  Procedure Laterality Date   BREAST EXCISIONAL BIOPSY Right 2020   BREAST SURGERY     cysts removed right breast, around 20 years ago per pt on 05/16/21   BREAST SURGERY  08/2018   benign mass   CARPAL TUNNEL RELEASE Right 01/08/2017   Procedure: CARPAL TUNNEL RELEASE, POSSIBLE RIGHT THUMB CARPOMETACARPAL INJECTION;  Surgeon: Camella Fallow, MD;  Location: [facility];  Service: Orthopedics;  Laterality: Right;  60 MINS   CESAREAN SECTION     1992 & 1994   COLPOSCOPY N/A 05/19/2021   Procedure: COLPOSCOPY;  Surgeon: Cathlyn JAYSON Nikki Bobie FORBES, MD;  Location: [facility];  Service: Gynecology;  Laterality: N/A;   ESOPHAGOGASTRODUODENOSCOPY  07/19/2020   in Epic   INGUINAL HIDRADENITIS EXCISION     many years ago per pt on 05/16/21   LEEP  05/04/2013   HPV change   LEEP N/A 05/19/2021   Procedure: LOOP ELECTROSURGICAL EXCISION PROCEDURE (LEEP), ENDOCERVICAL CURETTAGE;  Surgeon: Cathlyn JAYSON Nikki Bobie FORBES, MD;  Location: [facility];  Service: Gynecology;  Laterality: N/A;   SUPRACERVICAL ABDOMINAL HYSTERECTOMY  2002   Done for bleeding, fibroids in [place]    Current Outpatient Medications  Medication Sig Dispense Refill   AMBULATORY NON FORMULARY MEDICATION Set CPAP to 10 cm water pressure. Need download in 2 weeks after change of setting. 1 each 0   Ascorbic Acid (VITAMIN C) 1000 MG tablet Take 1 tablet by mouth daily.     Blood Glucose Monitoring Suppl (FREESTYLE LITE) DEVI      Cyanocobalamin (B-12) 500 MCG TABS Take 1 tablet by mouth daily.     estradiol  (ESTRACE ) 0.1 MG/GM vaginal cream Insert 1/2 gram vaginally three times a week. 42.5 g 0   famotidine  (PEPCID ) 40 MG tablet Take 1 tablet by mouth two times daily. 180 tablet 1   fluticasone  (FLONASE ) 50 MCG/ACT nasal spray Place 1 spray into both nostrils daily. 16 g 0   FOLIC ACID  PO Take 800 mcg by mouth daily.     glucose blood (FREESTYLE LITE) test strip USE TO TEST BLOOD SUGARS TWICE DAILY 400 strip 12   Lancets (FREESTYLE) lancets For use when testing blood sugars daily. DX:E11.69 200 each 12   melatonin 1 MG TABS tablet Take 1 mg by mouth at bedtime.     metFORMIN  (GLUCOPHAGE ) 1000 MG tablet Take 1 tablet (1,000 mg total) by mouth 2 (two) times daily with a meal. 180 tablet 1   pyridOXINE (VITAMIN B-6) 100 MG tablet  Take 100 mg by mouth daily.     rosuvastatin  (CRESTOR ) 40 MG tablet Take 1 tablet (40 mg total) by mouth daily. 90 tablet 3   Semaglutide  (RYBELSUS ) 3 MG TABS Take 1 tablet (3 mg total) by mouth daily. 30 tablet 1   valsartan  (DIOVAN ) 160 MG  tablet Take 1 tablet (160 mg total) by mouth daily. 90 tablet 3   Zinc Sulfate (ZINC 15 PO) Take by mouth.     No current facility-administered medications for this visit.    ALLERGIES: Sulfonamide derivatives, Xigduo  xr [dapagliflozin  pro-metformin  er], Omeprazole , Cefaclor, Cephalexin , Doxycycline , Hycodan [hydrocodone  bit-homatrop mbr], Januvia  [sitagliptin ], Levofloxacin , Penicillins, Tetracycline, and Victoza  [liraglutide ]  Family History  Problem Relation Age of Onset   Asthma Mother    COPD Mother    Diabetes Mother    Hypertension Mother    Diabetes Father    Hypertension Father    Breast cancer Maternal Aunt    Breast cancer Paternal Aunt    Colon cancer Neg Hx    Pancreatic cancer Neg Hx    Esophageal cancer Neg Hx    Stomach cancer Neg Hx    Liver disease Neg Hx     Review of Systems  All other systems reviewed and are negative.   PHYSICAL EXAM:  BP 126/84 (BP Location: Left Arm, Patient Position: Sitting)   Pulse 77   Ht 5' (1.524 m)   Wt 161 lb (73 kg)   LMP 01/27/2000 (Approximate) Comment: supracervical hysterectomy  SpO2 96%   BMI 31.44 kg/m     General appearance: alert, cooperative and appears stated age Head: normocephalic, without obvious abnormality, atraumatic Neck: no adenopathy, supple, symmetrical, trachea midline and thyroid  normal to inspection and palpation Lungs: clear to auscultation bilaterally Breasts: normal appearance, no masses or tenderness, No nipple retraction or dimpling, No nipple discharge or bleeding, No axillary adenopathy Heart: regular rate and rhythm Abdomen: soft, non-tender; no masses, no organomegaly Extremities: extremities normal, atraumatic, no cyanosis or edema Skin: skin color,  texture, turgor normal. No rashes or lesions Lymph nodes: cervical, supraclavicular, and axillary nodes normal. Neurologic: grossly normal  Pelvic: External genitalia:  no lesions              No abnormal inguinal nodes palpated.              Urethra:  normal appearing urethra with no masses, tenderness or lesions              Bartholins and Skenes: normal                 Vagina: normal appearing vagina with normal color and discharge, no lesions              Cervix: no lesions.  Appears stenotic and difficulty to identify cervix.               Pap taken: yes Bimanual Exam:  Uterus:  absent              Adnexa: no mass, fullness, tenderness              Rectal exam: yes.  Confirms.              Anus:  normal sphincter tone, no lesions  Chaperone was present for exam:  Kari HERO, CMA  ASSESSMENT: Well woman visit with gynecologic exam. Status post supracervical abdominal hysterectomy.   Ovaries remain.  LEEP showing HPV effect in 2015.   LEEP showing LGSIL 2023.  Pap in 2022 showing positive HR HPV.  Pap in 2023 was nondiagnostic and cervical stenosis.  Pap normal with negative HR HPV in 2024.  Cervical stenosis.   Status post right breast biopsies.   Vaginal atrophy.  DM. Personal negative BRCA testing following dx of daughter positive for BRCA1. PHQ-2-9: 0  PLAN: Mammogram screening discussed. Self breast awareness reviewed. Pap and HRV collected:  yes Guidelines for Calcium , Vitamin D, regular exercise program including cardiovascular and weight bearing exercise. Medication refills:  estradiol  cream 1/2 gram 2 - 3 times per week to control symptoms.  She knows she can use this even less often.  Potential effect on pre-existing breast cancer discussed.  If this pap is normal and negative HR HPV, can do next cervical cancer screening in 3 years.  Follow up:  yearly and prn.

## 2023-09-20 ENCOUNTER — Other Ambulatory Visit: Payer: Self-pay

## 2023-09-20 ENCOUNTER — Other Ambulatory Visit (HOSPITAL_COMMUNITY): Payer: Self-pay

## 2023-09-20 ENCOUNTER — Other Ambulatory Visit (HOSPITAL_COMMUNITY)
Admission: RE | Admit: 2023-09-20 | Discharge: 2023-09-20 | Disposition: A | Discharge status: Home / Self Care | Source: Ambulatory Visit | Attending: Obstetrics and Gynecology | Admitting: Obstetrics and Gynecology

## 2023-09-20 ENCOUNTER — Encounter: Payer: Self-pay | Admitting: Obstetrics and Gynecology

## 2023-09-20 ENCOUNTER — Ambulatory Visit (INDEPENDENT_AMBULATORY_CARE_PROVIDER_SITE_OTHER): Admitting: Obstetrics and Gynecology

## 2023-09-20 VITALS — BP 126/84 | HR 77 | Ht 60.0 in | Wt 161.0 lb

## 2023-09-20 DIAGNOSIS — Z1331 Encounter for screening for depression: Secondary | ICD-10-CM

## 2023-09-20 DIAGNOSIS — Z87410 Personal history of cervical dysplasia: Secondary | ICD-10-CM

## 2023-09-20 DIAGNOSIS — Z01419 Encounter for gynecological examination (general) (routine) without abnormal findings: Secondary | ICD-10-CM

## 2023-09-20 DIAGNOSIS — Z124 Encounter for screening for malignant neoplasm of cervix: Secondary | ICD-10-CM | POA: Insufficient documentation

## 2023-09-20 MED ORDER — ESTRADIOL 0.1 MG/GM VA CREA
TOPICAL_CREAM | VAGINAL | 1 refills | Status: DC
  Filled 2023-09-20: qty 42.5, 90d supply, fill #0

## 2023-09-20 NOTE — Patient Instructions (Signed)

## 2023-09-21 ENCOUNTER — Encounter: Payer: Self-pay | Admitting: Family Medicine

## 2023-09-21 MED ORDER — GLIPIZIDE ER 2.5 MG PO TB24
2.5000 mg | ORAL_TABLET | Freq: Every day | ORAL | 0 refills | Status: DC
  Filled 2023-09-21: qty 90, 90d supply, fill #0

## 2023-09-22 ENCOUNTER — Other Ambulatory Visit: Payer: Self-pay

## 2023-09-22 ENCOUNTER — Other Ambulatory Visit (HOSPITAL_COMMUNITY): Payer: Self-pay

## 2023-09-22 NOTE — Telephone Encounter (Signed)
 Please let pt know

## 2023-09-22 NOTE — Telephone Encounter (Signed)
 Call the [facility] where we sent her prescription for glipizide  and see if there is any contraindication to sulfa allergy the patient was asking about it.  But it actually did not flag in our EMR system and I was not familiar with that medicine being contraindicated with sulfur.  But if we can double check before patient picks it up.

## 2023-09-22 NOTE — Telephone Encounter (Signed)
 Spoke with pharmacist at [facility] - states the offical stance is that it is contraindicated with Sulpha allergies She states she is on metformin  and had s/e with Rybelsus - she would recommeded something in the Jardiance , Farxiga  type medications

## 2023-09-23 ENCOUNTER — Ambulatory Visit: Payer: Self-pay | Admitting: Obstetrics and Gynecology

## 2023-09-23 LAB — CYTOLOGY - PAP
Adequacy: ABSENT
Comment: NEGATIVE
High risk HPV: NEGATIVE

## 2023-09-28 ENCOUNTER — Encounter: Payer: Self-pay | Admitting: Sports Medicine

## 2023-09-29 ENCOUNTER — Other Ambulatory Visit (HOSPITAL_COMMUNITY): Payer: Self-pay

## 2023-09-29 ENCOUNTER — Encounter: Payer: Self-pay | Admitting: Obstetrics and Gynecology

## 2023-09-30 ENCOUNTER — Other Ambulatory Visit: Payer: Self-pay

## 2023-10-08 ENCOUNTER — Other Ambulatory Visit (HOSPITAL_COMMUNITY): Payer: Self-pay

## 2023-10-08 ENCOUNTER — Ambulatory Visit

## 2023-10-15 ENCOUNTER — Encounter: Payer: Self-pay | Admitting: Family Medicine

## 2023-10-15 DIAGNOSIS — Z1211 Encounter for screening for malignant neoplasm of colon: Secondary | ICD-10-CM

## 2023-10-15 NOTE — Telephone Encounter (Signed)
 Orders Placed This Encounter  Procedures  . Ambulatory referral to Gastroenterology    Referral Priority:  Routine    Referral Type:  Consultation    Referral Reason:  Specialty Services Required    Number of Visits Requested:  1

## 2023-10-15 NOTE — Telephone Encounter (Signed)
 Pended referral- o.k. to send change in location?

## 2023-10-22 ENCOUNTER — Ambulatory Visit
Admission: RE | Admit: 2023-10-22 | Discharge: 2023-10-22 | Disposition: A | Discharge status: Home / Self Care | Source: Ambulatory Visit | Attending: Family Medicine | Admitting: Family Medicine

## 2023-10-22 DIAGNOSIS — Z1231 Encounter for screening mammogram for malignant neoplasm of breast: Secondary | ICD-10-CM | POA: Diagnosis not present

## 2023-10-26 ENCOUNTER — Ambulatory Visit: Payer: Self-pay | Admitting: Family Medicine

## 2023-10-26 NOTE — Progress Notes (Signed)
 Please call patient. Normal mammogram.  Repeat in 1 year.

## 2023-10-31 ENCOUNTER — Other Ambulatory Visit (HOSPITAL_COMMUNITY): Payer: Self-pay

## 2023-11-02 ENCOUNTER — Encounter: Payer: Self-pay | Admitting: Pediatrics

## 2023-11-03 ENCOUNTER — Ambulatory Visit: Admitting: Family Medicine

## 2023-11-30 DIAGNOSIS — L821 Other seborrheic keratosis: Secondary | ICD-10-CM | POA: Diagnosis not present

## 2023-11-30 DIAGNOSIS — L570 Actinic keratosis: Secondary | ICD-10-CM | POA: Diagnosis not present

## 2023-12-01 ENCOUNTER — Encounter: Payer: Self-pay | Admitting: Family Medicine

## 2023-12-01 ENCOUNTER — Ambulatory Visit: Admitting: Family Medicine

## 2023-12-01 VITALS — BP 118/74 | HR 60 | Ht 59.5 in | Wt 162.1 lb

## 2023-12-01 DIAGNOSIS — I10 Essential (primary) hypertension: Secondary | ICD-10-CM

## 2023-12-01 DIAGNOSIS — E1169 Type 2 diabetes mellitus with other specified complication: Secondary | ICD-10-CM

## 2023-12-01 DIAGNOSIS — T50905A Adverse effect of unspecified drugs, medicaments and biological substances, initial encounter: Secondary | ICD-10-CM | POA: Diagnosis not present

## 2023-12-01 DIAGNOSIS — R809 Proteinuria, unspecified: Secondary | ICD-10-CM | POA: Diagnosis not present

## 2023-12-01 DIAGNOSIS — G4733 Obstructive sleep apnea (adult) (pediatric): Secondary | ICD-10-CM

## 2023-12-01 DIAGNOSIS — E1129 Type 2 diabetes mellitus with other diabetic kidney complication: Secondary | ICD-10-CM

## 2023-12-01 LAB — POCT GLYCOSYLATED HEMOGLOBIN (HGB A1C): Hemoglobin A1C: 7.1 % — AB (ref 4.0–5.6)

## 2023-12-01 NOTE — Assessment & Plan Note (Signed)
  Obstructive sleep apnea Managed with CPAP. Nasal discomfort resolved with cushion and humidifier use. - Continue CPAP therapy with nasal cushion and humidifier.

## 2023-12-01 NOTE — Progress Notes (Signed)
 Established Patient Office Visit  Patient ID: Maria Nunez, female    DOB: 1960/08/22  Age: 63 y.o. MRN: [id number] PCP: Alvan Dorothyann BIRCH, MD  Chief Complaint  Patient presents with   Hypertension   Diabetes    Subjective:     HPI  Discussed the use of AI scribe software for clinical note transcription with the patient, who gave verbal consent to proceed.  History of Present Illness Maria Nunez is a 63 year old female with diabetes who presents with concerns about medication side effects and blood sugar management.  Glycemic control and dietary habits - Diabetes mellitus with recent improvement in blood glucose levels to 7.1 mmol/L from a previous level of 3 mmol/L - Experiences hypoglycemic symptoms around lunchtime, requiring piece of candy by 3 PM - Lunch typically consists of a [employer] meal at her desk around 12:30 PM - Breakfast usually includes a small banana and an oatmeal square   Medication side effects - Developed anxiety, jitteriness, and difficulty concentrating after starting glipizide , which she took for one week - No rash or other physical symptoms associated with glipizide  - Discontinued glipizide  due to adverse effects  Interest in continuous glucose monitoring - Interested in using a continuous glucose monitor (CGM) to better understand blood sugar trends - Curious about the impact of different foods on blood glucose - Sister uses a CGM for insulin  management  Sleep apnea management - History of obstructive sleep apnea managed with CPAP therapy - Paused CPAP use due to nasal soreness from pillows - Resumed CPAP use with a cushion, which alleviated discomfort - No recent chest pain or shortness of breath      ROS    Objective:     BP 118/74   Pulse 60   Ht 4' 11.5 (1.511 m)   Wt 162 lb 1.6 oz (73.5 kg)   LMP 01/27/2000 (Approximate) Comment: supracervical hysterectomy  SpO2 100%   BMI 32.19 kg/m      Physical Exam Vitals and nursing note reviewed.  Constitutional:      Appearance: Normal appearance.  HENT:     Head: Normocephalic and atraumatic.  Eyes:     Conjunctiva/sclera: Conjunctivae normal.  Cardiovascular:     Rate and Rhythm: Normal rate and regular rhythm.  Pulmonary:     Effort: Pulmonary effort is normal.     Breath sounds: Normal breath sounds.  Skin:    General: Skin is warm and dry.  Neurological:     Mental Status: She is alert.  Psychiatric:        Mood and Affect: Mood normal.      Results for orders placed or performed in visit on 12/01/23  POCT HgB A1C  Result Value Ref Range   Hemoglobin A1C 7.1 (A) 4.0 - 5.6 %   HbA1c POC (<> result, manual entry)     HbA1c, POC (prediabetic range)     HbA1c, POC (controlled diabetic range)         The 10-year ASCVD risk score (Arnett DK, et al., 2019) is: 7.7%    Assessment & Plan:   Problem List Items Addressed This Visit       Cardiovascular and Mediastinum   HYPERTENSION, BENIGN SYSTEMIC   BP looks awesome!!!!        Respiratory   OSA (obstructive sleep apnea)    Obstructive sleep apnea Managed with CPAP. Nasal discomfort resolved with cushion and humidifier use. - Continue CPAP therapy with nasal cushion and humidifier.  Endocrine   Type 2 diabetes mellitus with microalbuminuria (HCC)   Type 2 diabetes mellitus HbA1c improved to 7.1%. Experiencing afternoon hypoglycemia, likely medication-related. Limited medication options due to intolerance. Discussed CGM benefits. - Retry Rybelsus  for three months to assess tolerance and efficacy. - Attempt to obtain insurance coverage for CGM [employer] or [employer]). - Incorporate a protein snack around 2 PM to prevent hypoglycemia. - Monitor dietary intake, focusing on portion control and carbohydrate moderation. - Consider insulin  therapy if Rybelsus  is not tolerated or effective.      Other Visit Diagnoses       Type 2 diabetes mellitus  with other specified complication, without long-term current use of insulin  (HCC)    -  Primary   Relevant Orders   POCT HgB A1C (Completed)     Medication side effect, initial encounter          Medication side effect - added glipizide  to intolerance list.   Assessment and Plan Assessment & Plan      Return in about 3 months (around 03/02/2024) for BP/DM.    Dorothyann Byars, MD [facility] at [facility]

## 2023-12-01 NOTE — Assessment & Plan Note (Signed)
BP looks awesome!!!

## 2023-12-01 NOTE — Assessment & Plan Note (Signed)
 Type 2 diabetes mellitus HbA1c improved to 7.1%. Experiencing afternoon hypoglycemia, likely medication-related. Limited medication options due to intolerance. Discussed CGM benefits. - Retry Rybelsus  for three months to assess tolerance and efficacy. - Attempt to obtain insurance coverage for CGM [employer] or [employer]). - Incorporate a protein snack around 2 PM to prevent hypoglycemia. - Monitor dietary intake, focusing on portion control and carbohydrate moderation. - Consider insulin  therapy if Rybelsus  is not tolerated or effective.

## 2023-12-03 ENCOUNTER — Encounter: Payer: Self-pay | Admitting: Pediatrics

## 2023-12-03 ENCOUNTER — Ambulatory Visit (AMBULATORY_SURGERY_CENTER): Admitting: *Deleted

## 2023-12-03 ENCOUNTER — Other Ambulatory Visit (HOSPITAL_COMMUNITY): Payer: Self-pay

## 2023-12-03 VITALS — Ht 59.0 in | Wt 160.0 lb

## 2023-12-03 DIAGNOSIS — Z1211 Encounter for screening for malignant neoplasm of colon: Secondary | ICD-10-CM

## 2023-12-03 DIAGNOSIS — E1169 Type 2 diabetes mellitus with other specified complication: Secondary | ICD-10-CM

## 2023-12-03 MED ORDER — NA SULFATE-K SULFATE-MG SULF 17.5-3.13-1.6 GM/177ML PO SOLN
1.0000 | Freq: Once | ORAL | 0 refills | Status: AC
  Filled 2023-12-03: qty 354, 1d supply, fill #0

## 2023-12-03 NOTE — Progress Notes (Signed)
 Pt's name and DOB verified at the beginning of the pre-visit with 2 identifiers  Pt denies any difficulty with ambulating,sitting, laying down or rolling side to side  Pt has no issues moving head neck or swallowing  No egg or soy allergy known to patient   Per pt no issues with sedation  No FH of Malignant Hyperthermia  Pt is not on home 02   Pt is not on blood thinners    Pt has frequent issues with constipation RN instructed pt to use Miralax per bottles instructions a week before prep days. Pt states they will  Pt is not on dialysis  Pt hx heart murmur  Pt denies any upcoming cardiac testing  Patient's chart reviewed by Norleen Schillings CNRA prior to pre-visit and patient appropriate for the [facility].  Pre-visit completed and red dot placed by patient's name on their procedure day (on provider's schedule).    Visit by phone  Pt states weight is 160 lb   Pt given  both [facility] main # and MD on call # prior to instructions.  Informed pt to come in at the time discussed and is shown on PV instructions.  Pt instructed to use Single[employer].com or GoodRx for a price reduction on prep  Instructed pt where to find PV instructions in My Ch. Copy of instructions  to be sent in mail and address read back to pt to verify correct on envelope. Instructed pt on all aspects of written instructions including med holds clothing to wear and foods to eat and not eat as well as after procedure legal restrictions and to call MD on call if needed.. Pt states understanding. Instructed pt to review instructions again prior to procedure and call main # given if has any questions or any issues. Pt states they will.

## 2023-12-04 ENCOUNTER — Other Ambulatory Visit (HOSPITAL_COMMUNITY): Payer: Self-pay

## 2023-12-06 ENCOUNTER — Other Ambulatory Visit: Payer: Self-pay

## 2023-12-06 ENCOUNTER — Other Ambulatory Visit (HOSPITAL_COMMUNITY): Payer: Self-pay

## 2023-12-08 ENCOUNTER — Encounter: Payer: Self-pay | Admitting: Family Medicine

## 2023-12-08 ENCOUNTER — Other Ambulatory Visit: Payer: Self-pay

## 2023-12-08 ENCOUNTER — Other Ambulatory Visit (HOSPITAL_BASED_OUTPATIENT_CLINIC_OR_DEPARTMENT_OTHER): Payer: Self-pay

## 2023-12-08 DIAGNOSIS — E1169 Type 2 diabetes mellitus with other specified complication: Secondary | ICD-10-CM

## 2023-12-08 MED ORDER — DEXCOM G7 SENSOR MISC
3 refills | Status: AC
  Filled 2023-12-08 – 2024-01-31 (×2): qty 6, 90d supply, fill #0

## 2023-12-12 NOTE — Progress Notes (Unsigned)
 [facility] History and Physical   Primary Care Physician:  Alvan Dorothyann BIRCH, MD   Reason for Procedure:  Colorectal cancer screening  Plan:    Colonoscopy   The patient was provided an opportunity to ask questions and all were answered. The patient agreed with the plan.   HPI: Maria Nunez is a 63 y.o. female undergoing colonoscopy for colorectal cancer screening.  Patient's last colonoscopy was performed in 2015 and was normal.  No family history of colorectal cancer or polyps.  Patient denies current symptoms of rectal bleeding or change in bowel habits.   Past Medical History:  Diagnosis Date   Abnormal ultrasound of ovary 2016   Ultrasound showing bilateral ovaries in contact with one another.  Asymptomatic.   Allergy    multiple med allergies   Anemia 04/29/2020   Arthritis    left knee   Bursitis 01/24/2016   left shoulder   Chest pain 01/29/2021   See 01/29/21 ED note in Epic. Pain significantly improved w/ NTG. CBC, CMP, troponin, BNP unremarkable. EKG showed NSR w/o acute ST changes. 02/19/21 [facility] Stress test - normal.   Chest pain 01/13/2019   Troponin negative x 2, Cxray normal, EKG sinus rhythm w/ borderline T abnormality. Discharged from ER w/ instruction to f/u w/PCP.   Chronic back pain    Chronic kidney disease    Stage 3, follows w/PCP   Complication of anesthesia    Pt states that one time she was told that she was given general anesthesia too fast. She woke up covered in blotches and was given IV Benadryl per pt.   COVID-19 03/05/2021   congestion, fever, all symptoms resolved as of 05/16/21   Diabetes mellitus    Type 2   Fatty liver    Fibroid    hysteretomy in 2002   GERD (gastroesophageal reflux disease)    Patient is taking Pepcid  as of 05/16/21.   Heart murmur    very long time ago per pt on 05/16/21   Hidradenitis    years ago per pt on 05/16/21   High cholesterol    follows w/ PCP Dr. Dorothyann Alvan    Hypertension    follows with PCP Dr. Dorothyann Alvan   Plantar fasciitis, bilateral 09/24/2020   Sleep apnea    wears CPAP   Spondylosis 2023   lumbar, cervicothoracic region with cervicalgia   Wears contact lenses    Wears glasses     Past Surgical History:  Procedure Laterality Date   BREAST EXCISIONAL BIOPSY Right 2020   BREAST SURGERY     cysts removed right breast, around 20 years ago per pt on 05/16/21   BREAST SURGERY  08/2018   benign mass   CARPAL TUNNEL RELEASE Right 01/08/2017   Procedure: CARPAL TUNNEL RELEASE, POSSIBLE RIGHT THUMB CARPOMETACARPAL INJECTION;  Surgeon: Camella Fallow, MD;  Location: [facility];  Service: Orthopedics;  Laterality: Right;  60 MINS   CESAREAN SECTION     1992 & 1994   COLPOSCOPY N/A 05/19/2021   Procedure: COLPOSCOPY;  Surgeon: Cathlyn JAYSON Nikki Bobie FORBES, MD;  Location: [facility];  Service: Gynecology;  Laterality: N/A;   ESOPHAGOGASTRODUODENOSCOPY  07/19/2020   in Epic   INGUINAL HIDRADENITIS EXCISION     many years ago per pt on 05/16/21   LEEP  05/04/2013   HPV change   LEEP N/A 05/19/2021   Procedure: LOOP ELECTROSURGICAL EXCISION PROCEDURE (LEEP), ENDOCERVICAL CURETTAGE;  Surgeon: Cathlyn  JAYSON Nikki Bobie FORBES, MD;  Location[facility];  Service: Gynecology;  Laterality: N/A;   SUPRACERVICAL ABDOMINAL HYSTERECTOMY  2002   Done for bleeding, fibroids in [place]    Prior to Admission medications   Medication Sig Start Date End Date Taking? Authorizing Provider  AMBULATORY NON FORMULARY MEDICATION Set CPAP to 10 cm water pressure. Need download in 2 weeks after change of setting. 09/10/20   Alvan Dorothyann BIRCH, MD  Ascorbic Acid (VITAMIN C) 1000 MG tablet Take 1 tablet by mouth daily. 04/10/18   [provider]  Blood Glucose Monitoring Suppl (FREESTYLE LITE) DEVI  03/13/19   [provider]  Continuous Glucose Sensor (DEXCOM G7 SENSOR) MISC Apply every 15 days to skin 12/08/23    Alvan Dorothyann BIRCH, MD  Cyanocobalamin (B-12) 500 MCG TABS Take 1 tablet by mouth daily.    [provider]  estradiol  (ESTRACE ) 0.1 MG/GM vaginal cream Insert 1/2 gram vaginally two to three times a week as needed to control symptoms. Patient not taking: Reported on 12/03/2023 09/20/23   Amundson C Silva, Brook E, MD  famotidine  (PEPCID ) 40 MG tablet Take 1 tablet by mouth two times daily. 04/15/23   Alvan Dorothyann BIRCH, MD  fluticasone  (FLONASE ) 50 MCG/ACT nasal spray Place 1 spray into both nostrils daily. Patient not taking: Reported on 12/03/2023 06/07/22   Raspet, Erin K, PA-C  FOLIC ACID  PO Take 800 mcg by mouth daily.    [provider]  glucose blood (FREESTYLE LITE) test strip USE TO TEST BLOOD SUGARS TWICE DAILY Patient not taking: Reported on 12/03/2023 08/28/22 12/01/23  Alvan Dorothyann BIRCH, MD  Lancets (FREESTYLE) lancets For use when testing blood sugars daily. DX:E11.69 03/13/19   Alvan Dorothyann BIRCH, MD  metFORMIN  (GLUCOPHAGE ) 1000 MG tablet Take 1 tablet (1,000 mg total) by mouth 2 (two) times daily with a meal. 12/29/22 12/29/23  Alvan Dorothyann BIRCH, MD  Multiple Vitamin (MULTIVITAMIN ADULT PO) Take by mouth daily at 12 noon.    [provider]  polyethylene glycol powder (GLYCOLAX/MIRALAX) 17 GM/SCOOP powder Take 17 g by mouth as needed. Dissolve 1 capful (17g) in 4-8 ounces of liquid and take by mouth daily.    [provider]  pyridOXINE (VITAMIN B-6) 100 MG tablet Take 100 mg by mouth daily.    [provider]  rosuvastatin  (CRESTOR ) 40 MG tablet Take 1 tablet (40 mg total) by mouth daily. 07/13/23 07/12/24  Alvan Dorothyann BIRCH, MD  valsartan  (DIOVAN ) 160 MG tablet Take 1 tablet (160 mg total) by mouth daily. 07/13/23 06/11/24  Alvan Dorothyann BIRCH, MD  Zinc Sulfate (ZINC 15 PO) Take by mouth.    [provider]  glipiZIDE  (GLUCOTROL  XL) 2.5 MG 24 hr tablet Take 1 tablet (2.5 mg total) by mouth daily with breakfast.  09/21/23 09/22/23  Alvan Dorothyann BIRCH, MD    Current Outpatient Medications  Medication Sig Dispense Refill   AMBULATORY NON FORMULARY MEDICATION Set CPAP to 10 cm water pressure. Need download in 2 weeks after change of setting. 1 each 0   Blood Glucose Monitoring Suppl (FREESTYLE LITE) DEVI      Continuous Glucose Sensor (DEXCOM G7 SENSOR) MISC Apply every 15 days to skin 6 each 3   Cyanocobalamin (B-12) 500 MCG TABS Take 1 tablet by mouth daily.     famotidine  (PEPCID ) 40 MG tablet Take 1 tablet by mouth two times daily. 180 tablet 1   FOLIC ACID  PO Take 800 mcg by mouth daily.  Lancets (FREESTYLE) lancets For use when testing blood sugars daily. DX:E11.69 200 each 12   metFORMIN  (GLUCOPHAGE ) 1000 MG tablet Take 1 tablet (1,000 mg total) by mouth 2 (two) times daily with a meal. 180 tablet 1   Multiple Vitamin (MULTIVITAMIN ADULT PO) Take by mouth daily at 12 noon.     rosuvastatin  (CRESTOR ) 40 MG tablet Take 1 tablet (40 mg total) by mouth daily. 90 tablet 3   valsartan  (DIOVAN ) 160 MG tablet Take 1 tablet (160 mg total) by mouth daily. 90 tablet 3   Ascorbic Acid (VITAMIN C) 1000 MG tablet Take 1 tablet by mouth daily.     estradiol  (ESTRACE ) 0.1 MG/GM vaginal cream Insert 1/2 gram vaginally two to three times a week as needed to control symptoms. (Patient not taking: Reported on 12/03/2023) 42.5 g 1   fluticasone  (FLONASE ) 50 MCG/ACT nasal spray Place 1 spray into both nostrils daily. (Patient not taking: Reported on 12/03/2023) 16 g 0   glucose blood (FREESTYLE LITE) test strip USE TO TEST BLOOD SUGARS TWICE DAILY (Patient not taking: Reported on 12/03/2023) 400 strip 12   polyethylene glycol powder (GLYCOLAX/MIRALAX) 17 GM/SCOOP powder Take 17 g by mouth as needed. Dissolve 1 capful (17g) in 4-8 ounces of liquid and take by mouth daily.     pyridOXINE (VITAMIN B-6) 100 MG tablet Take 100 mg by mouth daily.     Zinc Sulfate (ZINC 15 PO) Take by mouth.     Current Facility-Administered  Medications  Medication Dose Route Frequency Provider Last Rate Last Admin   0.9 %  sodium chloride  infusion  500 mL Intravenous Once Miklos Bidinger M, MD        Allergies as of 12/13/2023 - Review Complete 12/13/2023  Allergen Reaction Noted   Sulfonamide derivatives Hives    Xigduo  xr [dapagliflozin  pro-metformin  er] Hives 06/12/2016   Cefaclor Rash 12/03/2005   Cephalexin  Rash 09/04/2013   Doxycycline  Nausea And Vomiting 07/30/2021   Glipizide  Other (See Comments) 12/01/2023   Hycodan [hydrocodone  bit-homatrop mbr] Itching 04/28/2011   Januvia  [sitagliptin ] Nausea Only 10/03/2014   Levofloxacin  Itching and Rash 01/12/2018   Omeprazole  Itching 04/29/2020   Penicillins Other (See Comments) 04/02/2008   Tetracycline Nausea Only    Victoza  [liraglutide ] Other (See Comments) 09/22/2016    Family History  Problem Relation Age of Onset   Asthma Mother    COPD Mother    Diabetes Mother    Hypertension Mother    Diabetes Father    Hypertension Father    Breast cancer Maternal Aunt    Breast cancer Paternal Aunt    BRCA 1/2 Daughter    Colon cancer Neg Hx    Pancreatic cancer Neg Hx    Esophageal cancer Neg Hx    Stomach cancer Neg Hx    Liver disease Neg Hx    Colon polyps Neg Hx    Rectal cancer Neg Hx     Social History   Socioeconomic History   Marital status: Divorced    Spouse name: Not on file   Number of children: Not on file   Years of education: Not on file   Highest education level: Associate degree: academic program  Occupational History   Not on file  Tobacco Use   Smoking status: Never   Smokeless tobacco: Never  Vaping Use   Vaping status: Never Used  Substance and Sexual Activity   Alcohol use: No    Alcohol/week: 0.0 standard drinks of alcohol   Drug use: No  Sexual activity: Not Currently    Partners: Male    Birth control/protection: Surgical    Comment: TAH/supracervical  Other Topics Concern   Not on file  Social History Narrative    Not on file   Social Drivers of Health   Financial Resource Strain: Low Risk  (11/29/2023)   Overall Financial Resource Strain (CARDIA)    Difficulty of Paying Living Expenses: Not hard at all  Food Insecurity: No Food Insecurity (11/29/2023)   Hunger Vital Sign    Worried About Running Out of Food in the Last Year: Never true    Ran Out of Food in the Last Year: Never true  Transportation Needs: No Transportation Needs (11/29/2023)   PRAPARE - Administrator, Civil Service (Medical): No    Lack of Transportation (Non-Medical): No  Physical Activity: Sufficiently Active (11/29/2023)   Exercise Vital Sign    Days of Exercise per Week: 5 days    Minutes of Exercise per Session: 30 min  Stress: No Stress Concern Present (11/29/2023)   [employer] of Occupational Health - Occupational Stress Questionnaire    Feeling of Stress: Only a little  Social Connections: Socially Isolated (11/29/2023)   Social Connection and Isolation Panel    Frequency of Communication with Friends and Family: More than three times a week    Frequency of Social Gatherings with Friends and Family: Twice a week    Attends Religious Services: Never    Database Administrator or Organizations: No    Attends Engineer, Structural: Not on file    Marital Status: Divorced  Intimate Partner Violence: Not At Risk (08/13/2023)   Received from [facility]   HITS    Over the last 12 months how often did your partner physically hurt you?: Never    Over the last 12 months how often did your partner insult you or talk down to you?: Never    Over the last 12 months how often did your partner threaten you with physical harm?: Never    Over the last 12 months how often did your partner scream or curse at you?: Never    Review of Systems:  All other review of systems negative except as mentioned in the HPI.  Physical Exam: Vital signs BP (!) 141/81   Pulse 87   Temp (!) 97.2 F (36.2 C)   Ht 4'  11 (1.499 m)   Wt 160 lb (72.6 kg)   LMP 01/27/2000 (Approximate) Comment: supracervical hysterectomy  SpO2 95%   BMI 32.32 kg/m   General:   Alert,  Well-developed, well-nourished, pleasant and cooperative in NAD Airway:  Mallampati 3 Lungs:  Clear throughout to auscultation.   Heart:  Regular rate and rhythm; no murmurs, clicks, rubs,  or gallops. Abdomen:  Soft, nontender and nondistended. Normal bowel sounds.   Neuro/Psych:  Normal mood and affect. A and O x 3  Inocente Hausen, MD [facility]

## 2023-12-13 ENCOUNTER — Encounter: Payer: Self-pay | Admitting: Pediatrics

## 2023-12-13 ENCOUNTER — Ambulatory Visit: Admitting: Pediatrics

## 2023-12-13 VITALS — BP 102/46 | HR 77 | Temp 97.2°F | Resp 16 | Ht 59.0 in | Wt 160.0 lb

## 2023-12-13 DIAGNOSIS — Z1211 Encounter for screening for malignant neoplasm of colon: Secondary | ICD-10-CM

## 2023-12-13 DIAGNOSIS — K648 Other hemorrhoids: Secondary | ICD-10-CM

## 2023-12-13 DIAGNOSIS — N183 Chronic kidney disease, stage 3 unspecified: Secondary | ICD-10-CM | POA: Diagnosis not present

## 2023-12-13 DIAGNOSIS — E119 Type 2 diabetes mellitus without complications: Secondary | ICD-10-CM | POA: Diagnosis not present

## 2023-12-13 DIAGNOSIS — K5730 Diverticulosis of large intestine without perforation or abscess without bleeding: Secondary | ICD-10-CM | POA: Diagnosis not present

## 2023-12-13 DIAGNOSIS — K760 Fatty (change of) liver, not elsewhere classified: Secondary | ICD-10-CM | POA: Diagnosis not present

## 2023-12-13 DIAGNOSIS — G4730 Sleep apnea, unspecified: Secondary | ICD-10-CM | POA: Diagnosis not present

## 2023-12-13 DIAGNOSIS — K644 Residual hemorrhoidal skin tags: Secondary | ICD-10-CM

## 2023-12-13 DIAGNOSIS — I10 Essential (primary) hypertension: Secondary | ICD-10-CM | POA: Diagnosis not present

## 2023-12-13 MED ORDER — SODIUM CHLORIDE 0.9 % IV SOLN: 500.0000 mL | Freq: Once | INTRAVENOUS | Status: DC

## 2023-12-13 NOTE — Op Note (Signed)
 [facility] Patient Name: Maria Nunez Procedure Date: 12/13/2023 1:47 PM MRN: [id number] Endoscopist: Inocente Hausen , MD, [id number] Age: 63 Referring MD:  Date of Birth: [birth year] Gender: Female Account #: [id number] Procedure:                Colonoscopy Indications:              Screening for colorectal malignant neoplasm, Last                            colonoscopy: 2015 Medicines:                Monitored Anesthesia Care Procedure:                Pre-Anesthesia Assessment:                           - Prior to the procedure, a History and Physical                            was performed, and patient medications and                            allergies were reviewed. The patient's tolerance of                            previous anesthesia was also reviewed. The risks                            and benefits of the procedure and the sedation                            options and risks were discussed with the patient.                            All questions were answered, and informed consent                            was obtained. Prior Anticoagulants: The patient has                            taken no anticoagulant or antiplatelet agents. ASA                            Grade Assessment: II - A patient with mild systemic                            disease. After reviewing the risks and benefits,                            the patient was deemed in satisfactory condition to                            undergo the procedure.  After obtaining informed consent, the colonoscope                            was passed under direct vision. Throughout the                            procedure, the patient's blood pressure, pulse, and                            oxygen saturations were monitored continuously. The                            Olympus Scope SN [id number] was introduced through the                            anus and advanced to the cecum,  identified by                            appendiceal orifice and ileocecal valve. The                            colonoscopy was performed without difficulty. The                            patient tolerated the procedure well. The quality                            of the bowel preparation was adequate to identify                            polyps greater than 5 mm in size and 30 percent                            obscured in the right colon. The ileocecal valve,                            appendiceal orifice, and rectum were photographed. Scope In: 2:05:00 PM Scope Out: 2:18:56 PM Scope Withdrawal Time: 0 hours 11 minutes 18 seconds  Total Procedure Duration: 0 hours 13 minutes 56 seconds  Findings:                 Hemorrhoids were found on perianal exam.                           The digital rectal exam was normal. Pertinent                            negatives include normal sphincter tone and no                            palpable rectal lesions.                           A few small-mouthed diverticula were found in the  sigmoid colon.                           A moderate amount of semi-liquid stool was found in                            the transverse colon and in the ascending colon,                            interfering with visualization. Lavage of the area                            was performed using a large amount of sterile                            water, resulting in incomplete clearance with fair                            visualization.                           Internal hemorrhoids were found during retroflexion. Complications:            No immediate complications. Estimated blood loss:                            None. Estimated Blood Loss:     Estimated blood loss: none. Impression:               - Hemorrhoids found on perianal exam.                           - Diverticulosis in the sigmoid colon.                           - Stool in  the transverse colon and in the                            ascending colon.                           - Internal hemorrhoids.                           - No specimens collected. Recommendation:           - Discharge patient to home (ambulatory).                           - Repeat colonoscopy in 5 years for screening                            purposes with 2 day bowel prep due to obscuration                            of right colon by residual stool.                           -  The findings and recommendations were discussed                            with the patient's family.                           - Return to referring physician.                           - Patient has a contact number available for                            emergencies. The signs and symptoms of potential                            delayed complications were discussed with the                            patient. Return to normal activities tomorrow.                            Written discharge instructions were provided to the                            patient. Inocente Hausen, MD 12/13/2023 2:24:00 PM This report has been signed electronically.

## 2023-12-13 NOTE — Progress Notes (Signed)
 Pt's states no medical or surgical changes since previsit or office visit.

## 2023-12-13 NOTE — Progress Notes (Signed)
 Transferred to PACU via stretcher.  Not responding to stimulation at this time.  VSS upon leaving procedure room.

## 2023-12-13 NOTE — Patient Instructions (Signed)
 YOU HAD AN ENDOSCOPIC PROCEDURE TODAY AT THE [facility]:   Refer to the procedure report that was given to you for any specific questions about what was found during the examination.  If the procedure report does not answer your questions, please call your gastroenterologist to clarify.  If you requested that your care partner not be given the details of your procedure findings, then the procedure report has been included in a sealed envelope for you to review at your convenience later.  YOU SHOULD EXPECT: Some feelings of bloating in the abdomen. Passage of more gas than usual.  Walking can help get rid of the air that was put into your GI tract during the procedure and reduce the bloating. If you had a lower endoscopy (such as a colonoscopy or flexible sigmoidoscopy) you may notice spotting of blood in your stool or on the toilet paper. If you underwent a bowel prep for your procedure, you may not have a normal bowel movement for a few days.  Please Note:  You might notice some irritation and congestion in your nose or some drainage.  This is from the oxygen used during your procedure.  There is no need for concern and it should clear up in a day or so.  SYMPTOMS TO REPORT IMMEDIATELY:  Following lower endoscopy (colonoscopy or flexible sigmoidoscopy):  Excessive amounts of blood in the stool  Significant tenderness or worsening of abdominal pains  Swelling of the abdomen that is new, acute  Fever of 100F or higher  Following upper endoscopy (EGD)  Vomiting of blood or coffee ground material  New chest pain or pain under the shoulder blades  Painful or persistently difficult swallowing  New shortness of breath  Fever of 100F or higher  Black, tarry-looking stools  Discharge to home Repeat colonoscopy in 5 years Handouts on hemorrhoids and diverticulosis given   For urgent or emergent issues, a gastroenterologist can be reached at any hour by calling [phone]. Do  not use MyChart messaging for urgent concerns.    DIET:  We do recommend a small meal at first, but then you may proceed to your regular diet.  Drink plenty of fluids but you should avoid alcoholic beverages for 24 hours.  ACTIVITY:  You should plan to take it easy for the rest of today and you should NOT DRIVE or use heavy machinery until tomorrow (because of the sedation medicines used during the test).    FOLLOW UP: Our staff will call the number listed on your records the next business day following your procedure.  We will call around 7:15- 8:00 am to check on you and address any questions or concerns that you may have regarding the information given to you following your procedure. If we do not reach you, we will leave a message.     If any biopsies were taken you will be contacted by phone or by letter within the next 1-3 weeks.  Please call us  at [phone] if you have not heard about the biopsies in 3 weeks.    SIGNATURES/CONFIDENTIALITY: You and/or your care partner have signed paperwork which will be entered into your electronic medical record.  These signatures attest to the fact that that the information above on your After Visit Summary has been reviewed and is understood.  Full responsibility of the confidentiality of this discharge information lies with you and/or your care-partner.

## 2023-12-14 ENCOUNTER — Telehealth: Payer: Self-pay

## 2023-12-14 NOTE — Telephone Encounter (Signed)
  Follow up Call-     12/13/2023    1:20 PM  Call back number  Post procedure Call Back phone  # [phone]  Permission to leave phone message Yes     Patient questions:  Do you have a fever, pain , or abdominal swelling? No. Pain Score  0 *  Have you tolerated food without any problems? Yes.    Have you been able to return to your normal activities? Yes.    Do you have any questions about your discharge instructions: Diet   No. Medications  No. Follow up visit  No.  Do you have questions or concerns about your Care? No.  Actions: * If pain score is 4 or above: No action needed, pain <4.

## 2023-12-20 ENCOUNTER — Other Ambulatory Visit (HOSPITAL_COMMUNITY): Payer: Self-pay

## 2023-12-28 ENCOUNTER — Other Ambulatory Visit: Payer: Self-pay

## 2023-12-28 ENCOUNTER — Ambulatory Visit

## 2023-12-28 VITALS — BP 140/82 | Ht 59.0 in | Wt 160.0 lb

## 2023-12-28 DIAGNOSIS — R200 Anesthesia of skin: Secondary | ICD-10-CM | POA: Diagnosis not present

## 2023-12-28 DIAGNOSIS — M79641 Pain in right hand: Secondary | ICD-10-CM | POA: Diagnosis not present

## 2023-12-28 DIAGNOSIS — R202 Paresthesia of skin: Secondary | ICD-10-CM | POA: Diagnosis not present

## 2023-12-28 MED ORDER — METHYLPREDNISOLONE ACETATE 40 MG/ML IJ SUSP
40.0000 mg | Freq: Once | INTRAMUSCULAR | Status: AC
  Administered 2023-12-28: 10 mg via INTRA_ARTICULAR

## 2023-12-28 NOTE — Progress Notes (Signed)
 Subjective:    Patient ID: Maria Nunez, female    DOB: 63 y.o., November 30, 1960   MRN: [id number]  Chief Complaint: Nocturnal hand numbness, concern for right hand trigger finger  Discussed the use of AI scribe software for clinical note transcription with the patient, who gave verbal consent to proceed.  History of Present Illness 63 year old female with past medical history significant for hypertension,, type 2 diabetes (last A1c 7.1 on 12/01/2023), previously diagnosed carpal tunnel syndrome bilaterally (w/ surgical release on 01/08/17 on R), CKD 3 presenting with bilateral nocturnal hand numbness and concern for trigger finger in her right hand. She reports nightly symptoms in her hands now No symptoms during the day For the past several months her middle finger in her right hand has started catching Worse in the morning Seems to generally have gotten worse     Objective:   Vitals:   12/28/23 1544  BP: (!) 140/82    Left Hand/Fingers/Wrist ( compared to normal ) -Inspection: no swelling, erythema, ecchymoses. No bony deformity or atrophy of the hypothenar region -Palpation: TTP - DIP, - PIP, - MCP,  - metacarpals, - scaphoid/snuff box, - scaphoid tubercle -AROM/PROM: Full flexion, extension, supination, pronation of the wrist. Full flexion and extension of the fingers, with and without isolation of the DIP. -Strength: 5/5 flexion, 5/5 extension (radial), 5/5 pronation, 5/5 supination, 5/5 finger abduction (ulnar), 5/5 Ok sign (median) -sensation: intact on dorsum of hand (radial), 4th/5th digits (ulnar), 1st-3rd digits (median) -Special tests: - Finklestein, - Eichoff, - Tinel at wrist, - Durkin compression test, - CMC grind, - TFCC grind, - fovea sign  Right Hand/Fingers/Wrist ( compared to normal ) -Inspection: no swelling, erythema, ecchymoses. No bony deformity or atrophy of the hypothenar region -Palpation: TTP - DIP, - PIP, + MCP on the palmar aspect of the third  finger,  - metacarpals, - scaphoid/snuff box, - scaphoid tubercle.  There is palpable triggering present at the A1 pulley in the right middle finger. -AROM/PROM: Full flexion, extension, supination, pronation of the wrist. Full flexion and extension of the fingers, with and without isolation of the DIP. -Strength: 5/5 flexion, 5/5 extension (radial), 5/5 pronation, 5/5 supination, 5/5 finger abduction (ulnar), 5/5 Ok sign (median) -sensation: intact on dorsum of hand (radial), 4th/5th digits (ulnar), 1st-3rd digits (median) -Special tests: - Finklestein, - Eichoff, - Tinel at wrist, - Durkin compression test, - CMC grind, - TFCC grind, - fovea sign  Limited ultrasound evaluation of the bilateral wrists: Left wrist median nerve cross-sectional area 13 mm Right wrist median nerve cross-sectional area 12 mm Interpretation: Left-sided moderate carpal tunnel syndrome Right sided mild carpal tunnel syndrome  Trigger Finger Injection Procedure Note Maria Nunez 1960/11/01 Indications: Pain Procedure Details Verbal consent was obtained. Risks (including potential risk for skin lightening and potential atrophy), benefits and alternatives were discussed. The right 3rd digit A1 pulley of the flexor digitorum tendons was identified. Prepped with Chloraprep and Ethyl Chloride used for anesthesia. Under sterile conditions, patient injected with 20mg  Depo-Medrol , 0.5 cc of Mepivacaine 2%, and 0.25 cc of Sodium Bicarbonate  8.4% at the A1 pulley as visualized using the palmar crease aiming distally with 45 degree angle towards nodule; injected directly into tendon sheath. Medication flowed freely without resistance.      Assessment & Plan:   Assessment & Plan Maria Nunez is a pleasant 63 year old presenting for bilateral wrist numbness nocturnally which is consistent with carpal tunnel syndrome bilaterally which have confirmed with ultrasonographic examination of her today and  trigger finger in the  middle finger of her right hand.  Given her last A1c above 7 and her preference to space out injections due to the feeling that they cause her face to get red, I believe this is reasonable and we will provide just a trigger finger injection today.  Plan for follow-up for carpal tunnel syndrome injections but will separate these out as well.

## 2024-01-05 ENCOUNTER — Other Ambulatory Visit (HOSPITAL_COMMUNITY): Payer: Self-pay

## 2024-01-05 ENCOUNTER — Telehealth (HOSPITAL_COMMUNITY): Payer: Self-pay

## 2024-01-05 DIAGNOSIS — G5603 Carpal tunnel syndrome, bilateral upper limbs: Secondary | ICD-10-CM | POA: Diagnosis not present

## 2024-01-05 DIAGNOSIS — M65331 Trigger finger, right middle finger: Secondary | ICD-10-CM | POA: Diagnosis not present

## 2024-01-05 DIAGNOSIS — G5602 Carpal tunnel syndrome, left upper limb: Secondary | ICD-10-CM | POA: Diagnosis not present

## 2024-01-05 NOTE — Telephone Encounter (Signed)
 Medication: Dexcom G7 Able to fill? No Prior authorization required? Yes Co-pay before assistance: N/A

## 2024-01-06 ENCOUNTER — Other Ambulatory Visit (HOSPITAL_COMMUNITY): Payer: Self-pay

## 2024-01-06 ENCOUNTER — Other Ambulatory Visit: Payer: Self-pay

## 2024-01-06 ENCOUNTER — Telehealth (HOSPITAL_COMMUNITY): Payer: Self-pay

## 2024-01-06 NOTE — Telephone Encounter (Signed)
 Pharmacy Patient Advocate Encounter   Received notification from Pt Calls Messages that prior authorization for Dexcom G7 Sensor  is required/requested.   Insurance verification completed.   The patient is insured through [facility].   Per test claim: PA required; PA submitted to above mentioned insurance via Latent Key/confirmation #/EOC [id number] Status is pending

## 2024-01-06 NOTE — Telephone Encounter (Signed)
 PA request has been Received. New Encounter has been or will be created for follow up. For additional info see Pharmacy Prior Auth telephone encounter from 01/06/24.

## 2024-01-06 NOTE — Telephone Encounter (Signed)
 Pharmacy Patient Advocate Encounter  Received notification from [facility] that Prior Authorization for  Dexcom G7 Sensor  has been APPROVED from 01/06/24 to 01/04/25. Ran test claim, Copay is $139.78. This test claim was processed through [facility]- copay amounts may vary at other pharmacies due to pharmacy/plan contracts, or as the patient moves through the different stages of their insurance plan.   PA #/Case ID/Reference #: [id number]

## 2024-01-11 ENCOUNTER — Ambulatory Visit

## 2024-01-31 ENCOUNTER — Other Ambulatory Visit (HOSPITAL_COMMUNITY): Payer: Self-pay

## 2024-03-03 ENCOUNTER — Ambulatory Visit: Admitting: Podiatry

## 2024-03-03 ENCOUNTER — Encounter: Payer: Self-pay | Admitting: Podiatry

## 2024-03-03 DIAGNOSIS — E1169 Type 2 diabetes mellitus with other specified complication: Secondary | ICD-10-CM

## 2024-03-03 DIAGNOSIS — B351 Tinea unguium: Secondary | ICD-10-CM

## 2024-03-03 NOTE — Addendum Note (Signed)
 Addended by: WAYLAN ELIDIA PARAS on: 03/03/2024 11:43 AM   Modules accepted: Orders

## 2024-03-03 NOTE — Progress Notes (Signed)
 "  Subjective:  Patient ID: Maria Nunez, female    DOB: 07-May-1960,   MRN: [id number]  Chief Complaint  Patient presents with   Diabetes    I have something going on with one toenail.  I don't know if it's an ingrown toenail or nail fungus.  Also, can she cut my toenails.  Saw Dr. Alvan - 12/01/2023; A1c - 7.1    64 y.o. female presents for concern of left great toenail discoloration. Relates she started having pain with it other day. Does relate some tingling and burning and pain in the toe. Currently it is better right now She also relates some lower back pain that has been aggravated recently by shoveling the snow. She is diabetic. Her last A1c was 7.1.  . Denies any other pedal complaints. Denies n/v/f/c.   Past Medical History:  Diagnosis Date   Abnormal ultrasound of ovary 2016   Ultrasound showing bilateral ovaries in contact with one another.  Asymptomatic.   Allergy    multiple med allergies   Anemia 04/29/2020   Arthritis    left knee   Bursitis 01/24/2016   left shoulder   Chest pain 01/29/2021   See 01/29/21 ED note in Epic. Pain significantly improved w/ NTG. CBC, CMP, troponin, BNP unremarkable. EKG showed NSR w/o acute ST changes. 02/19/21 [facility] Stress test - normal.   Chest pain 01/13/2019   Troponin negative x 2, Cxray normal, EKG sinus rhythm w/ borderline T abnormality. Discharged from ER w/ instruction to f/u w/PCP.   Chronic back pain    Chronic kidney disease    Stage 3, follows w/PCP   Complication of anesthesia    Pt states that one time she was told that she was given general anesthesia too fast. She woke up covered in blotches and was given IV Benadryl per pt.   COVID-19 03/05/2021   congestion, fever, all symptoms resolved as of 05/16/21   Diabetes mellitus    Type 2   Fatty liver    Fibroid    hysteretomy in 2002   GERD (gastroesophageal reflux disease)    Patient is taking Pepcid  as of 05/16/21.   Heart murmur    very long time  ago per pt on 05/16/21   Hidradenitis    years ago per pt on 05/16/21   High cholesterol    follows w/ PCP Dr. Dorothyann Alvan   Hypertension    follows with PCP Dr. Dorothyann Alvan   Plantar fasciitis, bilateral 09/24/2020   Sleep apnea    wears CPAP   Spondylosis 2023   lumbar, cervicothoracic region with cervicalgia   Wears contact lenses    Wears glasses     Objective:  Physical Exam: Vascular: DP/PT pulses 2/4 bilateral. CFT <3 seconds. Normal hair growth on digits. No edema.  Skin. No lacerations or abrasions bilateral feet.  Nails 1-5 bilateral normal in appearance. Left hallux  Musculoskeletal: MMT 5/5 bilateral lower extremities in DF, PF, Inversion and Eversion. Deceased ROM in DF of ankle joint.  Neurological: Sensation intact to light touch. Protective sensation intact.   Assessment:   1. Type 2 diabetes mellitus with other specified complication, without long-term current use of insulin  (HCC)   2. Onychomycosis        Plan:  Patient was evaluated and treated and all questions answered. -Discussed and educated patient on diabetic foot care, especially with  regards to the vascular, neurological and musculoskeletal systems.  -Stressed the importance of good  glycemic control and the detriment of not  controlling glucose levels in relation to the foot. Nails 1-5 bilateral debrided as courtesy today without incident.  -Discussed supportive shoes at all times and checking feet regularly.  -Answered all patient questions -Examined patient Discussed possible neuropathy vs radiculopathy with patient and treatment options. Discussed possible pain because of irritation from shoveling will give some time and see if symptoms resolve if so likely due to back issues and if continues should be evaluated.  -Discussed treatment options for painful dystrophic nails  -Clinical picture and Fungal culture was obtained by removing a portion of the hard nail itself from each of  the involved toenails using a sterile nail nipper and sent to [facility] lab. Patient tolerated the biopsy procedure well without discomfort or need for anesthesia.  -Discussed fungal nail treatment options including oral, topical, and laser treatments.  -Patient to return in 4 weeks for follow up evaluation and discussion of fungal culture results or sooner if symptoms worsen.    Asberry Failing, DPM    "

## 2024-03-06 ENCOUNTER — Ambulatory Visit: Admitting: Family Medicine

## 2024-03-06 DIAGNOSIS — E1129 Type 2 diabetes mellitus with other diabetic kidney complication: Secondary | ICD-10-CM

## 2024-03-06 DIAGNOSIS — E1169 Type 2 diabetes mellitus with other specified complication: Secondary | ICD-10-CM

## 2024-03-06 DIAGNOSIS — I10 Essential (primary) hypertension: Secondary | ICD-10-CM

## 2024-03-29 ENCOUNTER — Ambulatory Visit: Admitting: Obstetrics and Gynecology

## 2024-03-31 ENCOUNTER — Ambulatory Visit: Admitting: Podiatry

## 2024-05-19 ENCOUNTER — Ambulatory Visit: Admitting: Podiatry

## 2024-09-25 ENCOUNTER — Ambulatory Visit: Admitting: Obstetrics and Gynecology
